# Patient Record
Sex: Female | Born: 1950 | Race: White | Hispanic: No | Marital: Married | Smoking: Former smoker
Health system: Southern US, Community
[De-identification: ages and names within clinical notes are randomized; demographics above are authoritative.]

## PROBLEM LIST (undated history)

## (undated) DIAGNOSIS — M1990 Unspecified osteoarthritis, unspecified site: Secondary | ICD-10-CM

## (undated) DIAGNOSIS — I10 Essential (primary) hypertension: Secondary | ICD-10-CM

## (undated) DIAGNOSIS — T4145XA Adverse effect of unspecified anesthetic, initial encounter: Secondary | ICD-10-CM

## (undated) DIAGNOSIS — R102 Pelvic and perineal pain: Secondary | ICD-10-CM

## (undated) DIAGNOSIS — Z9889 Other specified postprocedural states: Secondary | ICD-10-CM

## (undated) DIAGNOSIS — J189 Pneumonia, unspecified organism: Secondary | ICD-10-CM

## (undated) DIAGNOSIS — E039 Hypothyroidism, unspecified: Secondary | ICD-10-CM

## (undated) DIAGNOSIS — M719 Bursopathy, unspecified: Secondary | ICD-10-CM

## (undated) DIAGNOSIS — R000 Tachycardia, unspecified: Secondary | ICD-10-CM

## (undated) DIAGNOSIS — G459 Transient cerebral ischemic attack, unspecified: Secondary | ICD-10-CM

## (undated) DIAGNOSIS — R002 Palpitations: Secondary | ICD-10-CM

## (undated) DIAGNOSIS — R51 Headache: Secondary | ICD-10-CM

## (undated) DIAGNOSIS — F1120 Opioid dependence, uncomplicated: Secondary | ICD-10-CM

## (undated) DIAGNOSIS — F32A Depression, unspecified: Secondary | ICD-10-CM

## (undated) DIAGNOSIS — I499 Cardiac arrhythmia, unspecified: Secondary | ICD-10-CM

## (undated) DIAGNOSIS — K279 Peptic ulcer, site unspecified, unspecified as acute or chronic, without hemorrhage or perforation: Secondary | ICD-10-CM

## (undated) DIAGNOSIS — Z8719 Personal history of other diseases of the digestive system: Secondary | ICD-10-CM

## (undated) DIAGNOSIS — R109 Unspecified abdominal pain: Secondary | ICD-10-CM

## (undated) DIAGNOSIS — I639 Cerebral infarction, unspecified: Secondary | ICD-10-CM

## (undated) DIAGNOSIS — K219 Gastro-esophageal reflux disease without esophagitis: Secondary | ICD-10-CM

## (undated) DIAGNOSIS — R519 Headache, unspecified: Secondary | ICD-10-CM

## (undated) DIAGNOSIS — E785 Hyperlipidemia, unspecified: Secondary | ICD-10-CM

## (undated) DIAGNOSIS — F329 Major depressive disorder, single episode, unspecified: Secondary | ICD-10-CM

## (undated) DIAGNOSIS — R0600 Dyspnea, unspecified: Secondary | ICD-10-CM

## (undated) DIAGNOSIS — C539 Malignant neoplasm of cervix uteri, unspecified: Secondary | ICD-10-CM

## (undated) DIAGNOSIS — G8929 Other chronic pain: Secondary | ICD-10-CM

## (undated) DIAGNOSIS — R932 Abnormal findings on diagnostic imaging of liver and biliary tract: Secondary | ICD-10-CM

## (undated) DIAGNOSIS — I219 Acute myocardial infarction, unspecified: Secondary | ICD-10-CM

## (undated) DIAGNOSIS — R0602 Shortness of breath: Secondary | ICD-10-CM

## (undated) DIAGNOSIS — D649 Anemia, unspecified: Secondary | ICD-10-CM

## (undated) DIAGNOSIS — J45909 Unspecified asthma, uncomplicated: Secondary | ICD-10-CM

## (undated) DIAGNOSIS — C4490 Unspecified malignant neoplasm of skin, unspecified: Secondary | ICD-10-CM

## (undated) DIAGNOSIS — J449 Chronic obstructive pulmonary disease, unspecified: Secondary | ICD-10-CM

## (undated) DIAGNOSIS — N993 Prolapse of vaginal vault after hysterectomy: Principal | ICD-10-CM

## (undated) DIAGNOSIS — I2510 Atherosclerotic heart disease of native coronary artery without angina pectoris: Secondary | ICD-10-CM

## (undated) DIAGNOSIS — M549 Dorsalgia, unspecified: Secondary | ICD-10-CM

## (undated) DIAGNOSIS — R011 Cardiac murmur, unspecified: Secondary | ICD-10-CM

## (undated) HISTORY — DX: Dorsalgia, unspecified: M54.9

## (undated) HISTORY — PX: APPENDECTOMY: SHX54

## (undated) HISTORY — DX: Other specified postprocedural states: Z98.890

## (undated) HISTORY — DX: Palpitations: R00.2

## (undated) HISTORY — DX: Hypothyroidism, unspecified: E03.9

## (undated) HISTORY — PX: OTHER SURGICAL HISTORY: SHX169

## (undated) HISTORY — PX: CHOLECYSTECTOMY: SHX55

## (undated) HISTORY — PX: TONSILLECTOMY: SHX5217

## (undated) HISTORY — PX: INGUINAL HERNIA REPAIR: SUR1180

## (undated) HISTORY — DX: Essential (primary) hypertension: I10

## (undated) HISTORY — DX: Major depressive disorder, single episode, unspecified: F32.9

## (undated) HISTORY — DX: Transient cerebral ischemic attack, unspecified: G45.9

## (undated) HISTORY — DX: Abnormal findings on diagnostic imaging of liver and biliary tract: R93.2

## (undated) HISTORY — PX: EYE SURGERY: SHX253

## (undated) HISTORY — DX: Other chronic pain: G89.29

## (undated) HISTORY — PX: BACK SURGERY: SHX140

## (undated) HISTORY — DX: Unspecified malignant neoplasm of skin, unspecified: C44.90

## (undated) HISTORY — DX: Tachycardia, unspecified: R00.0

## (undated) HISTORY — PX: JOINT REPLACEMENT: SHX530

## (undated) HISTORY — DX: Acute myocardial infarction, unspecified: I21.9

## (undated) HISTORY — DX: Unspecified abdominal pain: R10.9

## (undated) HISTORY — DX: Depression, unspecified: F32.A

## (undated) HISTORY — DX: Malignant neoplasm of cervix uteri, unspecified: C53.9

## (undated) HISTORY — DX: Atherosclerotic heart disease of native coronary artery without angina pectoris: I25.10

## (undated) HISTORY — DX: Pelvic and perineal pain: R10.2

## (undated) HISTORY — PX: TONSILLECTOMY: SUR1361

## (undated) HISTORY — DX: Prolapse of vaginal vault after hysterectomy: N99.3

## (undated) HISTORY — DX: Gastro-esophageal reflux disease without esophagitis: K21.9

## (undated) HISTORY — PX: PARTIAL HYSTERECTOMY: SHX80

## (undated) HISTORY — PX: ABDOMINAL HYSTERECTOMY: SHX81

## (undated) HISTORY — DX: Chronic obstructive pulmonary disease, unspecified: J44.9

## (undated) HISTORY — DX: Peptic ulcer, site unspecified, unspecified as acute or chronic, without hemorrhage or perforation: K27.9

## (undated) HISTORY — DX: Hyperlipidemia, unspecified: E78.5

## (undated) HISTORY — DX: Bursopathy, unspecified: M71.9

---

## 1981-08-06 DIAGNOSIS — G459 Transient cerebral ischemic attack, unspecified: Secondary | ICD-10-CM

## 1981-08-06 HISTORY — DX: Transient cerebral ischemic attack, unspecified: G45.9

## 1982-08-06 DIAGNOSIS — C539 Malignant neoplasm of cervix uteri, unspecified: Secondary | ICD-10-CM

## 1982-08-06 HISTORY — DX: Malignant neoplasm of cervix uteri, unspecified: C53.9

## 1998-08-06 DIAGNOSIS — I219 Acute myocardial infarction, unspecified: Secondary | ICD-10-CM

## 1998-08-06 HISTORY — DX: Acute myocardial infarction, unspecified: I21.9

## 1999-02-03 ENCOUNTER — Ambulatory Visit (HOSPITAL_COMMUNITY): Admission: RE | Admit: 1999-02-03 | Discharge: 1999-02-03 | Payer: Self-pay | Admitting: Psychiatry

## 1999-05-03 ENCOUNTER — Ambulatory Visit: Admission: RE | Admit: 1999-05-03 | Discharge: 1999-05-03 | Payer: Self-pay | Admitting: Psychiatry

## 1999-08-09 ENCOUNTER — Ambulatory Visit (HOSPITAL_COMMUNITY): Admission: RE | Admit: 1999-08-09 | Discharge: 1999-08-09 | Payer: Self-pay | Admitting: Internal Medicine

## 2002-03-22 ENCOUNTER — Emergency Department (HOSPITAL_COMMUNITY): Admission: EM | Admit: 2002-03-22 | Discharge: 2002-03-22 | Payer: Self-pay | Admitting: *Deleted

## 2002-07-04 ENCOUNTER — Encounter: Payer: Self-pay | Admitting: Emergency Medicine

## 2002-07-04 ENCOUNTER — Emergency Department (HOSPITAL_COMMUNITY): Admission: EM | Admit: 2002-07-04 | Discharge: 2002-07-04 | Payer: Self-pay | Admitting: Emergency Medicine

## 2003-02-06 ENCOUNTER — Emergency Department (HOSPITAL_COMMUNITY): Admission: EM | Admit: 2003-02-06 | Discharge: 2003-02-06 | Payer: Self-pay | Admitting: Emergency Medicine

## 2003-05-22 ENCOUNTER — Emergency Department (HOSPITAL_COMMUNITY): Admission: EM | Admit: 2003-05-22 | Discharge: 2003-05-23 | Payer: Self-pay | Admitting: Emergency Medicine

## 2003-05-25 ENCOUNTER — Emergency Department (HOSPITAL_COMMUNITY): Admission: EM | Admit: 2003-05-25 | Discharge: 2003-05-25 | Payer: Self-pay | Admitting: *Deleted

## 2003-08-07 HISTORY — PX: CARDIAC CATHETERIZATION: SHX172

## 2003-09-09 ENCOUNTER — Emergency Department (HOSPITAL_COMMUNITY): Admission: EM | Admit: 2003-09-09 | Discharge: 2003-09-09 | Payer: Self-pay | Admitting: Emergency Medicine

## 2003-12-04 ENCOUNTER — Emergency Department (HOSPITAL_COMMUNITY): Admission: EM | Admit: 2003-12-04 | Discharge: 2003-12-04 | Payer: Self-pay | Admitting: *Deleted

## 2004-03-21 ENCOUNTER — Inpatient Hospital Stay (HOSPITAL_COMMUNITY): Admission: AD | Admit: 2004-03-21 | Discharge: 2004-03-23 | Payer: Self-pay | Admitting: Cardiology

## 2004-03-21 ENCOUNTER — Encounter: Payer: Self-pay | Admitting: Cardiology

## 2004-04-02 ENCOUNTER — Emergency Department (HOSPITAL_COMMUNITY): Admission: EM | Admit: 2004-04-02 | Discharge: 2004-04-02 | Payer: Self-pay | Admitting: Emergency Medicine

## 2004-04-16 ENCOUNTER — Emergency Department (HOSPITAL_COMMUNITY): Admission: EM | Admit: 2004-04-16 | Discharge: 2004-04-16 | Payer: Self-pay | Admitting: Emergency Medicine

## 2004-04-17 ENCOUNTER — Ambulatory Visit (HOSPITAL_COMMUNITY): Admission: RE | Admit: 2004-04-17 | Discharge: 2004-04-17 | Payer: Self-pay | Admitting: *Deleted

## 2004-05-11 ENCOUNTER — Emergency Department (HOSPITAL_COMMUNITY): Admission: EM | Admit: 2004-05-11 | Discharge: 2004-05-11 | Payer: Self-pay | Admitting: Emergency Medicine

## 2004-06-13 ENCOUNTER — Ambulatory Visit: Payer: Self-pay | Admitting: *Deleted

## 2004-10-03 ENCOUNTER — Ambulatory Visit: Payer: Self-pay | Admitting: *Deleted

## 2004-10-04 ENCOUNTER — Ambulatory Visit: Payer: Self-pay | Admitting: Orthopedic Surgery

## 2004-10-07 ENCOUNTER — Ambulatory Visit (HOSPITAL_COMMUNITY): Admission: RE | Admit: 2004-10-07 | Discharge: 2004-10-07 | Payer: Self-pay | Admitting: Orthopedic Surgery

## 2004-10-12 ENCOUNTER — Ambulatory Visit: Payer: Self-pay | Admitting: Orthopedic Surgery

## 2004-10-18 ENCOUNTER — Ambulatory Visit: Payer: Self-pay | Admitting: Cardiovascular Disease

## 2005-04-01 ENCOUNTER — Inpatient Hospital Stay (HOSPITAL_COMMUNITY): Admission: EM | Admit: 2005-04-01 | Discharge: 2005-04-04 | Payer: Self-pay | Admitting: Emergency Medicine

## 2005-04-02 ENCOUNTER — Ambulatory Visit: Payer: Self-pay | Admitting: Cardiology

## 2005-04-16 ENCOUNTER — Ambulatory Visit (HOSPITAL_COMMUNITY): Admission: RE | Admit: 2005-04-16 | Discharge: 2005-04-16 | Payer: Self-pay | Admitting: Cardiology

## 2005-04-17 ENCOUNTER — Ambulatory Visit: Payer: Self-pay | Admitting: *Deleted

## 2005-04-23 ENCOUNTER — Ambulatory Visit: Payer: Self-pay | Admitting: *Deleted

## 2005-11-26 ENCOUNTER — Emergency Department (HOSPITAL_COMMUNITY): Admission: EM | Admit: 2005-11-26 | Discharge: 2005-11-26 | Payer: Self-pay | Admitting: Emergency Medicine

## 2005-12-04 ENCOUNTER — Ambulatory Visit: Payer: Self-pay | Admitting: *Deleted

## 2005-12-24 ENCOUNTER — Ambulatory Visit (HOSPITAL_COMMUNITY): Admission: RE | Admit: 2005-12-24 | Discharge: 2005-12-24 | Payer: Self-pay | Admitting: Sports Medicine

## 2006-01-11 ENCOUNTER — Ambulatory Visit (HOSPITAL_COMMUNITY): Admission: RE | Admit: 2006-01-11 | Discharge: 2006-01-11 | Payer: Self-pay | Admitting: Sports Medicine

## 2007-08-15 ENCOUNTER — Ambulatory Visit (HOSPITAL_COMMUNITY): Admission: RE | Admit: 2007-08-15 | Discharge: 2007-08-15 | Payer: Self-pay | Admitting: Internal Medicine

## 2007-09-04 ENCOUNTER — Ambulatory Visit: Payer: Self-pay | Admitting: Internal Medicine

## 2007-09-10 ENCOUNTER — Ambulatory Visit (HOSPITAL_COMMUNITY): Admission: RE | Admit: 2007-09-10 | Discharge: 2007-09-10 | Payer: Self-pay | Admitting: Gastroenterology

## 2007-09-10 ENCOUNTER — Ambulatory Visit: Payer: Self-pay | Admitting: Gastroenterology

## 2007-09-10 DIAGNOSIS — Z9889 Other specified postprocedural states: Secondary | ICD-10-CM

## 2007-09-10 HISTORY — DX: Other specified postprocedural states: Z98.890

## 2007-09-16 ENCOUNTER — Ambulatory Visit (HOSPITAL_COMMUNITY): Admission: RE | Admit: 2007-09-16 | Discharge: 2007-09-16 | Payer: Self-pay | Admitting: Gastroenterology

## 2007-09-18 ENCOUNTER — Ambulatory Visit (HOSPITAL_COMMUNITY): Admission: RE | Admit: 2007-09-18 | Discharge: 2007-09-18 | Payer: Self-pay | Admitting: Gastroenterology

## 2007-09-23 ENCOUNTER — Ambulatory Visit: Payer: Self-pay | Admitting: Gastroenterology

## 2007-10-15 ENCOUNTER — Ambulatory Visit: Payer: Self-pay | Admitting: Gastroenterology

## 2007-10-15 ENCOUNTER — Ambulatory Visit (HOSPITAL_COMMUNITY): Admission: RE | Admit: 2007-10-15 | Discharge: 2007-10-15 | Payer: Self-pay | Admitting: Gastroenterology

## 2007-10-16 ENCOUNTER — Ambulatory Visit (HOSPITAL_COMMUNITY): Admission: RE | Admit: 2007-10-16 | Discharge: 2007-10-16 | Payer: Self-pay | Admitting: Gastroenterology

## 2007-10-28 ENCOUNTER — Ambulatory Visit: Payer: Self-pay | Admitting: Gastroenterology

## 2008-03-16 ENCOUNTER — Ambulatory Visit (HOSPITAL_COMMUNITY): Admission: RE | Admit: 2008-03-16 | Discharge: 2008-03-16 | Payer: Self-pay | Admitting: Internal Medicine

## 2008-05-13 ENCOUNTER — Encounter: Payer: Self-pay | Admitting: Emergency Medicine

## 2008-05-13 ENCOUNTER — Observation Stay (HOSPITAL_COMMUNITY): Admission: EM | Admit: 2008-05-13 | Discharge: 2008-05-15 | Payer: Self-pay | Admitting: *Deleted

## 2008-05-14 ENCOUNTER — Encounter (INDEPENDENT_AMBULATORY_CARE_PROVIDER_SITE_OTHER): Payer: Self-pay | Admitting: *Deleted

## 2008-09-13 ENCOUNTER — Ambulatory Visit (HOSPITAL_COMMUNITY): Admission: RE | Admit: 2008-09-13 | Discharge: 2008-09-13 | Payer: Self-pay | Admitting: Internal Medicine

## 2009-01-09 IMAGING — CR DG CHEST 1V PORT
1 series · 1 of 1 positions shown · non-contrast
Comparison: [HOSPITAL] chest x-rays 01/11/2006 and CT
chest 03/16/2008.

CLINICAL DATA: Chest pressure/ache 1 week with fever.

PORTABLE CHEST - 1 VIEW

[view not recorded]
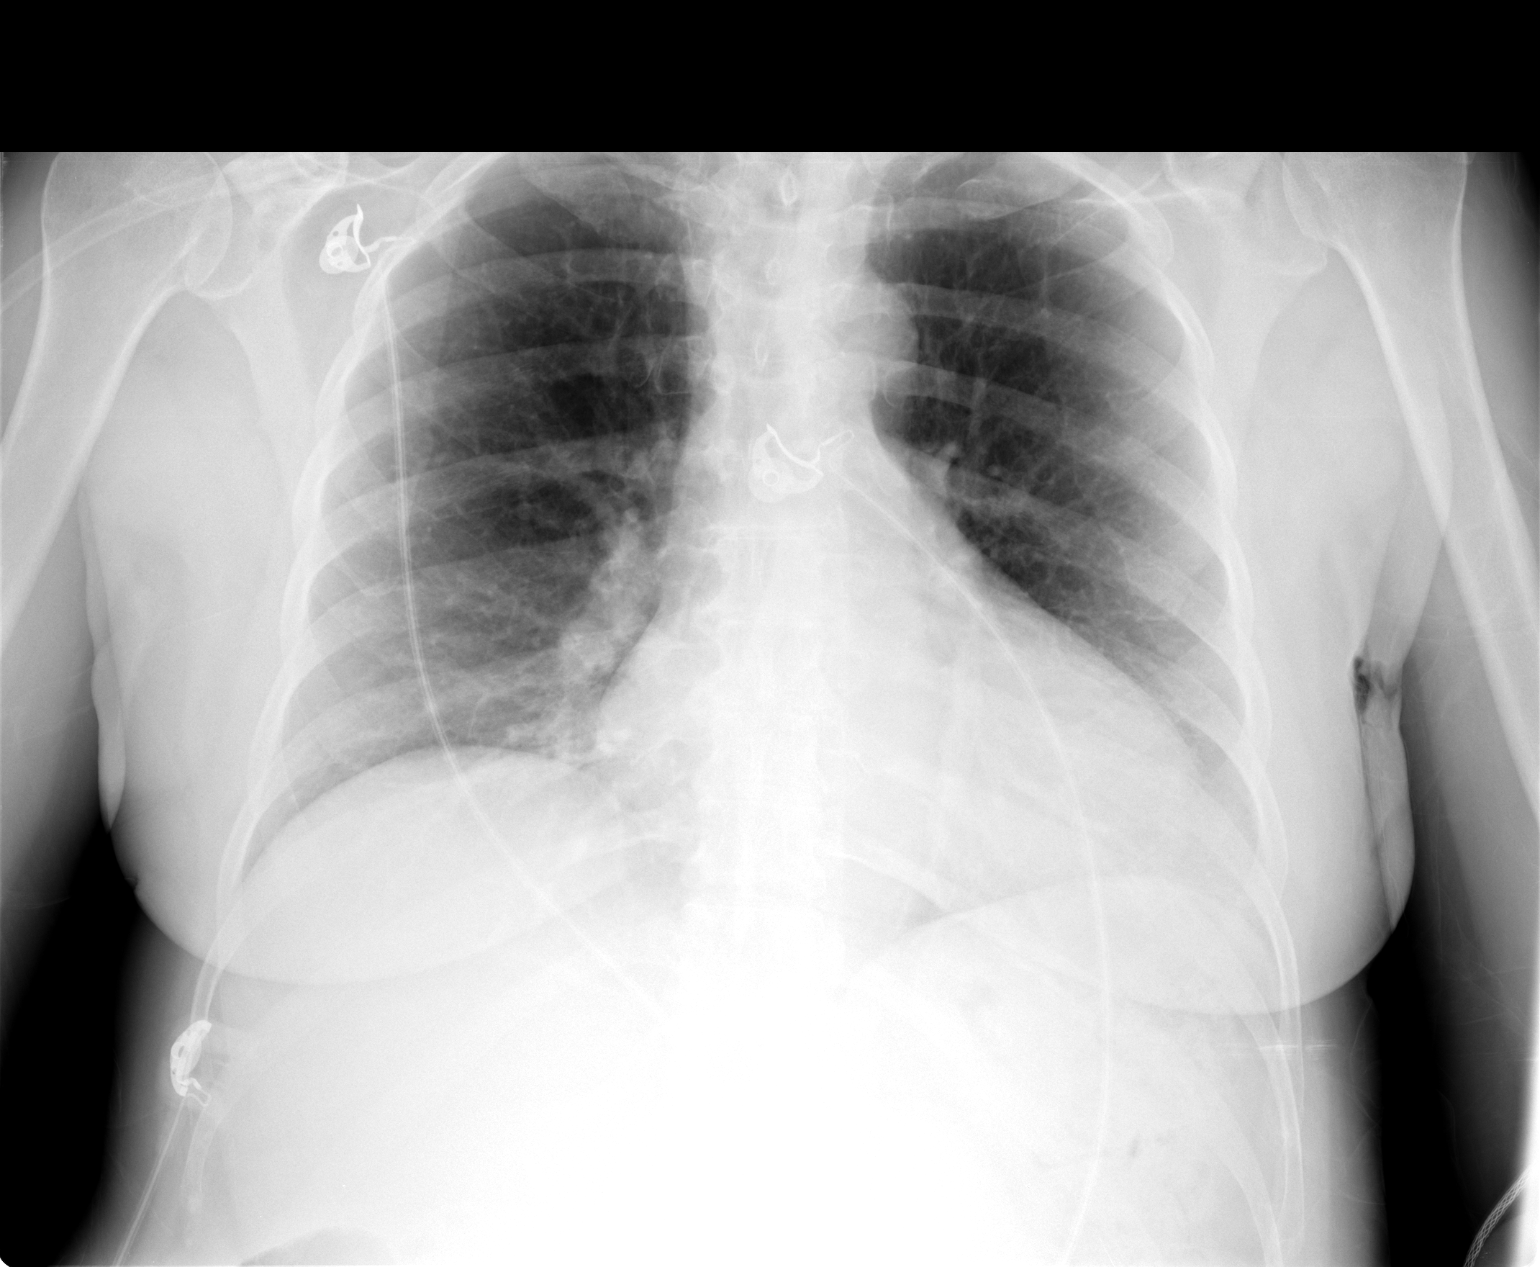

[1 of 1 positions shown; findings below may reference images not displayed]

FINDINGS: Previous slight bibasilar subcentimeter nodules on CT
chest not appreciated. Lesser inspiration is seen with stable
generalized prominence bronchopulmonary markings since chest x-ray
01/11/2006. Lungs are otherwise clear with no edema or focal
pneumonia.  Borderline cardiomegaly noted.  Mediastinum, hila,
pleura and osseous structures appear normal.
IMPRESSION: 1.  Submaximal inspiration with AP magnification borderline
cardiomegaly with stable mild chronic bronchitis.
2.  Otherwise no active cardiopulmonary disease.

## 2009-09-20 ENCOUNTER — Ambulatory Visit (HOSPITAL_COMMUNITY): Admission: RE | Admit: 2009-09-20 | Discharge: 2009-09-20 | Payer: Self-pay | Admitting: Internal Medicine

## 2009-10-14 ENCOUNTER — Emergency Department (HOSPITAL_COMMUNITY)
Admission: EM | Admit: 2009-10-14 | Discharge: 2009-10-14 | Payer: Self-pay | Source: Home / Self Care | Admitting: Emergency Medicine

## 2010-01-20 ENCOUNTER — Emergency Department (HOSPITAL_COMMUNITY): Admission: EM | Admit: 2010-01-20 | Discharge: 2010-01-20 | Payer: Self-pay | Admitting: Emergency Medicine

## 2010-08-27 ENCOUNTER — Encounter: Payer: Self-pay | Admitting: Internal Medicine

## 2010-10-27 LAB — CK TOTAL AND CKMB (NOT AT ARMC)
CK, MB: 2.2 ng/mL (ref 0.3–4.0)
Relative Index: 1.8 (ref 0.0–2.5)

## 2010-11-17 ENCOUNTER — Other Ambulatory Visit (HOSPITAL_COMMUNITY): Payer: Self-pay | Admitting: Obstetrics & Gynecology

## 2010-11-17 DIAGNOSIS — N951 Menopausal and female climacteric states: Secondary | ICD-10-CM

## 2010-11-17 DIAGNOSIS — Z139 Encounter for screening, unspecified: Secondary | ICD-10-CM

## 2010-11-27 ENCOUNTER — Ambulatory Visit (HOSPITAL_COMMUNITY)
Admission: RE | Admit: 2010-11-27 | Discharge: 2010-11-27 | Disposition: A | Discharge status: Home / Self Care | Payer: BC Managed Care – PPO | Source: Ambulatory Visit | Attending: Obstetrics & Gynecology | Admitting: Obstetrics & Gynecology

## 2010-11-27 DIAGNOSIS — Z13820 Encounter for screening for osteoporosis: Secondary | ICD-10-CM | POA: Insufficient documentation

## 2010-11-27 DIAGNOSIS — Z139 Encounter for screening, unspecified: Secondary | ICD-10-CM

## 2010-11-27 DIAGNOSIS — Z780 Asymptomatic menopausal state: Secondary | ICD-10-CM | POA: Insufficient documentation

## 2010-11-27 DIAGNOSIS — N951 Menopausal and female climacteric states: Secondary | ICD-10-CM

## 2010-11-27 DIAGNOSIS — Z1231 Encounter for screening mammogram for malignant neoplasm of breast: Secondary | ICD-10-CM | POA: Insufficient documentation

## 2010-11-28 ENCOUNTER — Other Ambulatory Visit: Payer: Self-pay | Admitting: Obstetrics & Gynecology

## 2010-11-28 DIAGNOSIS — R928 Other abnormal and inconclusive findings on diagnostic imaging of breast: Secondary | ICD-10-CM

## 2010-12-01 ENCOUNTER — Other Ambulatory Visit (HOSPITAL_COMMUNITY): Payer: Self-pay | Admitting: Obstetrics & Gynecology

## 2010-12-06 ENCOUNTER — Ambulatory Visit (HOSPITAL_COMMUNITY)
Admission: RE | Admit: 2010-12-06 | Discharge: 2010-12-06 | Disposition: A | Discharge status: Home / Self Care | Payer: BC Managed Care – PPO | Source: Ambulatory Visit | Attending: Obstetrics & Gynecology | Admitting: Obstetrics & Gynecology

## 2010-12-06 ENCOUNTER — Ambulatory Visit (HOSPITAL_COMMUNITY): Payer: BC Managed Care – PPO

## 2010-12-06 DIAGNOSIS — R928 Other abnormal and inconclusive findings on diagnostic imaging of breast: Secondary | ICD-10-CM | POA: Insufficient documentation

## 2010-12-13 ENCOUNTER — Encounter: Payer: Self-pay | Admitting: Urgent Care

## 2010-12-13 ENCOUNTER — Encounter: Payer: Self-pay | Admitting: Gastroenterology

## 2010-12-13 ENCOUNTER — Ambulatory Visit (INDEPENDENT_AMBULATORY_CARE_PROVIDER_SITE_OTHER): Payer: BC Managed Care – PPO | Admitting: Urgent Care

## 2010-12-13 DIAGNOSIS — K219 Gastro-esophageal reflux disease without esophagitis: Secondary | ICD-10-CM | POA: Insufficient documentation

## 2010-12-13 DIAGNOSIS — R932 Abnormal findings on diagnostic imaging of liver and biliary tract: Secondary | ICD-10-CM

## 2010-12-13 DIAGNOSIS — R1310 Dysphagia, unspecified: Secondary | ICD-10-CM

## 2010-12-13 DIAGNOSIS — R198 Other specified symptoms and signs involving the digestive system and abdomen: Secondary | ICD-10-CM

## 2010-12-13 NOTE — Progress Notes (Signed)
Referring Provider: Carmie End), MD Primary Care Physician:  Carylon Perches, MD Primary Gastroenterologist:  Dr. Darrick Penna  Chief Complaint  Patient presents with  . Dysphagia    HPI:  Selena [name] is a 60 y.o. female here as a referral from Dr. Ouida Sills for dysphagia.  Hx GERD & extensive GI work-up years ago for chronic functional abdominal pain.  3-4 mo ago, developed dysphagia w/ solids & liquids "feels like spasm in upper esophagus".  Felt like was being strangled recently went drinking OJ at home.  Denies any heartburn or indigestion on protonix 40mg  daily.  Wt steadily increasing.  Appetite ok.  Denies nausea or vomiting.  C/o regurgitation a few seconds after eating 2-3 times per week.  Denies any abd pain.  C/o constipation daily OTC stool softener.  BM QOD.  Denies rectal bleeding or melena.  Has green stools.   Past Medical History  Diagnosis Date  . Cervical ca 1984  . Constipation   . Hemorrhoids   . HTN (hypertension)   . Depression     w/ menopause  . Hyperlipemia   . Hypothyroidism   . CAD (coronary artery disease)   . MI (myocardial infarction)   . Tachycardia     being evaluated recently by Dr. Alanda Amass  . TIA (transient ischemic attack) 1983  . GERD (gastroesophageal reflux disease)   . Bursitis   . Chronic abdominal pain     functional, seen @[facility] , extensive WU here benign   . Skin cancer     nose  . Chronic back pain     on methadone  . PUD (peptic ulcer disease) 1997/1998    negative h pylori  . Abnormal findings on imaging of biliary tract 1997-2000    CBD 21mm, Dr Danny Lawless biliary/pancreatic manometry, EUS Dr. Christella Hartigan 09/18/07 dilated but otherwise normal extrahepatic duct, no masses  . S/P colonoscopy 09/10/07    Dr. Teddy Spike sec to tortuous colon, followed by [facility] & flex sig normal    Past Surgical History  Procedure Date  . Partial hysterectomy   . Cholecystectomy   . Appendectomy   . Right arm     nerve  . Inguinal hernia repair    left  . Tonsillectomy     Current Outpatient Prescriptions  Medication Sig Dispense Refill  . calcium carbonate 1250 MG capsule Take 1,250 mg by mouth 2 (two) times daily with a meal.        . Cholecalciferol (VITAMIN D-3 PO) Take 1,000 Int'l Units by mouth daily.        . cyclobenzaprine (FLEXERIL) 10 MG tablet Prn       . furosemide (LASIX) 40 MG tablet       . gabapentin (NEURONTIN) 600 MG tablet       . KLOR-CON M10 10 MEQ tablet       . levothyroxine (SYNTHROID, LEVOTHROID) 150 MCG tablet Take 150 mcg by mouth daily.        Marland Kitchen lisinopril (PRINIVIL,ZESTRIL) 10 MG tablet       . methadone (DOLOPHINE) 10 MG tablet       . metoprolol tartrate (LOPRESSOR) 25 MG tablet       . pantoprazole (PROTONIX) 40 MG tablet       . simvastatin (ZOCOR) 40 MG tablet       . verapamil (CALAN-SR) 240 MG CR tablet         Allergies as of 12/13/2010 - Review Complete 12/13/2010  Allergen Reaction Noted  . Iohexol  08/15/2007  . Nubain (nalbuphine hcl)  12/13/2010  . Penicillins  12/13/2010    Family History:  There is no known family history of colorectal carcinoma , liver disease, or inflammatory bowel disease.   Problem Relation Age of Onset  . Ulcers Brother   . Ulcers Sister     History   Social History  . Marital Status: Married    Spouse Name: N/A    Number of Children: 1  . Years of Education: N/A   Occupational History  . 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 Smoking status: Former Smoker -- 1.0 packs/day for 22 years    Types: Cigarettes    Quit date: 01/05/2004  . Smokeless tobacco: Not on file  . Alcohol Use: No  . Drug Use: Not on file  . Sexually Active: No   Review of Systems: Gen: Denies any fever, chills, sweats, anorexia, fatigue, weakness, malaise, weight loss, and sleep disorder CV: Denies chest pain, angina, palpitations, syncope, orthopnea, PND, peripheral edema, and claudication. Resp: Denies dyspnea at rest, dyspnea with exercise,  cough, sputum, wheezing, coughing up blood, and pleurisy. GI: Denies vomiting blood, jaundice, and fecal incontinence.   Denies dysphagia or odynophagia. GU : Denies urinary burning, blood in urine, urinary frequency, urinary hesitancy, nocturnal urination, and urinary incontinence. MS: Denies joint pain, limitation of movement, and swelling, stiffness, low back pain, extremity pain. Denies muscle weakness, cramps, atrophy.  Derm: Denies rash, itching, dry skin, hives, moles, warts, or unhealing ulcers.  Psych: Denies depression, anxiety, memory loss, suicidal ideation, hallucinations, paranoia, and confusion. Heme: Denies bruising, bleeding, and enlarged lymph nodes.  Physical Exam: BP 90/61  Pulse 97  Temp(Src) 96 F (35.6 C) (Tympanic)  Ht 5\' 5"  (1.651 m)  Wt 195 lb (88.451 kg)  BMI 32.45 kg/m2 General:   Alert,  Well-developed, well-nourished, pleasant and cooperative in NAD Head:  Normocephalic and atraumatic. Eyes:  Sclera clear, no icterus.   Conjunctiva pink. Ears:  Normal auditory acuity. Nose:  No deformity, discharge,  or lesions. Mouth:  No deformity or lesions, dentition normal. Neck:  Supple; no masses or thyromegaly. Lungs:  Clear throughout to auscultation.   No wheezes, crackles, or rhonchi. No acute distress. Heart:  Regular rate and rhythm; no murmurs, clicks, rubs,  or gallops. Abdomen:  Soft, nontender and nondistended. No masses, hepatosplenomegaly or hernias noted. Normal bowel sounds, without guarding, and without rebound.   Msk:  Symmetrical without gross deformities. Normal posture. Pulses:  Normal pulses noted. Extremities:  Without clubbing or edema. Neurologic:  Alert and  oriented x4;  grossly normal neurologically. Skin:  Intact without significant lesions or rashes. Cervical Nodes:  No significant cervical adenopathy. Psych:  Alert and cooperative. Normal mood and affect.

## 2010-12-13 NOTE — Assessment & Plan Note (Signed)
Well controlled on daily pantoprazole 40mg .

## 2010-12-13 NOTE — Progress Notes (Signed)
Cc to PCP 

## 2010-12-13 NOTE — Assessment & Plan Note (Signed)
Selena [name] is a 60 y.o. caucasian female w/ 3-4 month hx of dysphagia with both solids & liquids in setting of chronic GERD, as well as what appears to be some aspiration.  Cannot r/o oropharyngeal component, but will need evaluation w/ EGD and possible esophageal dilation by Dr. Jonette Eva to rule out structural component such as web, ring or stricture.  I have discussed risks & benefits which include, but are not limited to, bleeding, infection, perforation & drug reaction.  The patient agrees with this plan & written consent will be obtained.  Procedure will need to be done with deep sedation (propofol) in the OR under the direction of anesthesia services for hx chronic narcotic use.    On a side note, she is due for colonoscopy w/ fluoroscopy/overtube at [facility] for incomplete exam 3 yrs ago.  This can be arranged after dysphagia work-up.

## 2010-12-18 ENCOUNTER — Encounter (HOSPITAL_COMMUNITY): Payer: BC Managed Care – PPO

## 2010-12-18 ENCOUNTER — Other Ambulatory Visit: Payer: Self-pay | Admitting: Gastroenterology

## 2010-12-18 LAB — BASIC METABOLIC PANEL
BUN: 26 mg/dL — ABNORMAL HIGH (ref 6–23)
Chloride: 102 mEq/L (ref 96–112)
Glucose, Bld: 83 mg/dL (ref 70–99)
Potassium: 4.3 mEq/L (ref 3.5–5.1)
Sodium: 140 mEq/L (ref 135–145)

## 2010-12-19 NOTE — Op Note (Signed)
Selena [name], GABA               ACCOUNT NO.:  [id number]   MEDICAL RECORD NO.:  [id number]          PATIENT TYPE:  AMB   LOCATION:  DAY                           FACILITY:  [facility]   PHYSICIAN:  Kassie Mends, M.D.      DATE OF BIRTH:  May 01, 1951   DATE OF PROCEDURE:  10/15/2007  DATE OF DISCHARGE:                               OPERATIVE REPORT   PROCEDURE:  Sigmoidoscopy.   INDICATION FOR EXAM:  Ms. Barcia is a 60 year old female who had an  attempted colonoscopy on September 10, 2007.  It was an incomplete  colonoscopy due to looping of the scope in the bowel.  A barium enema  was ordered and performed to complete her evaluation.  It was noted that  she had a filling defect in the sigmoid colon suspicious for sessile  polyp.  On the barium enema it was noted that she had mark sigmoid and  less severe hepatic flexure redundancy.  The sigmoidoscopy is being  performed to reevaluate her sigmoid colon due to redundancy and the  possibility of the small polyp that could have been easily missed.   FINDINGS:  1. Advanced the colonoscope to 60-70 cm beyond anal verge.  The scope      was withdrawn and no polyps were visualized.  The scope was again      advanced to 60-70 cm beyond anal verge to what appeared to be the      splenic flexure.  The scope was slowly withdrawn and again, no      sessile polyp was noted.  She did have several areas of viscous      mucus that was adhered to the bowel wall.  2. Rare sigmoid colon diverticula.  Melanosis coli.  Otherwise no      masses, inflammatory changes or arteriovenous malformations seen.  3. Moderate internal hemorrhoids, otherwise normal retroflexed view of      the rectum.   RECOMMENDATIONS:  1. We recommend Ms. Labrum have a colonoscopy within three years at      [facility] with fluoroscopy and      an overtube in order to completely evaluate her colon.  2. She should follow high fiber diet.  She is  given a handout on high-      fiber diet and hemorrhoids and diverticulosis.   MEDICATIONS:  1. Demerol 75 mg IV.  2. Versed 4 mg IV.  3. Phenergan 12.5 mg IV.   PROCEDURE TECHNIQUE:  Physical exam was performed.  Informed consent was  obtained from the patient after explaining the benefits, risks and  alternatives to the procedure.  The patient was connected to monitor and  placed in left lateral position.  Continuous oxygen was provided by  nasal cannula and IV medicine administered through an indwelling  cannula.  After administration of sedation and rectal exam, the  patient's rectum intubated.  The scope was advanced under direct  visualization to what appeared to be the splenic flexure.  The scope was  withdrawn slowly by carefully examine the color, texture, anatomy and  integrity of the mucosa on the way out.  The scope was again  advanced under direct visualization to what appeared to be the splenic  flexure approximately 60 to 70 cm from anal verge.  The scope was  removed slowly by carefully examining the color, texture, anatomy and  integrity of the mucosa on the way out.  The patient was recovered in  endoscopy and discharged home in satisfactory condition.      Kassie Mends, M.D.  Electronically Signed     SM/MEDQ  D:  10/15/2007  T:  10/16/2007  Job:  [id number]   cc:   Kingsley Callander. Ouida Sills, MD  Fax: [phone]

## 2010-12-19 NOTE — H&P (Signed)
NAMEKEIASHA, DIEP NO.:  [id number]   MEDICAL RECORD NO.:  [id number]          PATIENT TYPE:  INP   LOCATION:  2907                         FACILITY:  [facility]   PHYSICIAN:  Elmore Guise., M.D.DATE OF BIRTH:  02-25-1951   DATE OF ADMISSION:  05/13/2008  DATE OF DISCHARGE:                              HISTORY & PHYSICAL   PRIMARY CARDIOLOGIST:  Dr. Ronny Flurry.   REASON FOR ADMISSION:  Chest pain.   HISTORY OF PRESENT ILLNESS:  Ms. Jasmin is a very pleasant 60 year old  white female with past medical history of chronic lower back pain,  hypertension, dyslipidemia, history of CVA (1983), and history of  gastroesophageal reflux disease, who presented to an outside hospital  with 1-week history of increasing malaise, subjective fever,  nonproductive cough and off-and-on chest pressure.  The patient reports  her chest pressure starting really 2 days ago; however, it has worsened  over the last 2 days.  Today, she was at home, just doing her normal  activities when she felt a pressure.  She felt like an elephant  sitting on my chest.  This was associated with mild shortness of  breath, as well as palpitations with my heart fluttering.  With her  symptoms, she called the office.  She was told to go to her local  emergency room.  There, she was given nitroglycerin with some  improvement; however, her symptoms recurred.  Therefore, the patient was  started on a nitroglycerin drip.  She was transferred to [facility]  for further evaluation.  The patient reports chronic lower back pain.  She has had no orthopnea, PND or lower extremity edema.  She is able to  ambulate normally without any significant problems.  She denies any  claudication type symptoms.  She has had chronic reflux, however, this  is different than her indigestion pain.  She is allergic to IV contrast  causing anaphylaxis.   REVIEW OF SYSTEMS:  Are as per HPI.  All others negative.   CURRENT MEDICATIONS:  1. Synthroid 150 mcg daily.  2. Flexeril 10 mg q.8 h.  3. Neurontin 600 mg daily.  4. Digoxin 0.25 mg daily.  5. Zocor 40 mg daily.  6. Methadone 10 mg twice daily.  7. Toprol XL 25 mg daily.  8. Verapamil 180 mg daily.  9. Lasix 40 mg daily.  10.Imdur 30 mg daily.   ALLERGIES:  1. IV CONTRAST.  2. NUBAIN.  3. PENICILLIN.   FAMILY HISTORY:  Positive for hypertension and heart disease.   SOCIAL HISTORY:  She is married.  She denies any tobacco or alcohol use.   PHYSICAL EXAMINATION:  VITAL SIGNS:  She is afebrile.  Blood pressure  135/70, heart rate is in the 60s.  She is sating 95% on room air.  GENERAL:  She is a very pleasant middle-aged white female, alert and  oriented x4 in no acute distress.  HEENT:  Appears normal.  NECK:  Supple.  No lymphadenopathy, 2+ carotids.  No JVD and no bruits.  LUNGS:  Clear.  HEART:  Regular with no murmurs, gallops  or rubs.  ABDOMEN:  Soft, nontender, nondistended.  No rebound or guarding.  EXTREMITIES:  Warm with 2+ pulses and no edema.   Her chest x-ray shows very mild cardiomegaly, otherwise no acute  cardiopulmonary disease.  Her D-dimer is 0.49.  Her BUN and creatinine  are 5 and 0.72, potassium level 4.3.  Her white blood cell count is 6.2  with hemoglobin of 14.4, platelet count of 241.  She did have 7%  eosinophils noted on her differential.  Her LFTs are normal.  Her  cardiac markers are negative.  Her ECG shows sinus bradycardia at 56 per  minute with T-wave inversions in V1-V2.  Compared to her prior tracing  from 2005, her Her T-wave changes have actually improved.  At that  tracing, she had T-wave inversions all the way across her precordial  leads.  She did undergo cardiac catheterization back in 2005 by Dr.  Charlies Constable.  This showed normal left main, LAD, septal perforators,  diagonal were normal, circumflex, ramus, obtuse marginals were normal.  Right coronary was nondominant and normal, LV was  55-60%   IMPRESSION:  1. 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  2. Normal catheterization back in 2005.  3. Hypertension.  4. Dyslipidemia.  5. Chronic low back pain.   PLAN:  At this time, we will admit the patient to the hospital.  We will  rule out myocardial infarction by checking serial cardiac enzymes.  I do  wonder whether her symptoms are related to her recent virus.  She will  have an echocardiogram performed.  We will stop her digoxin and her  Toprol because of her bradycardia and continue nitroglycerin and  verapamil since possible vasospastic component could be involved.  We  will start DVT dose Lovenox.  She will have fasting lipids, TSH, as well  as routine blood work in the morning.  Dr. Patty Sermons to assume care in  the morning.      Elmore Guise., M.D.  Electronically Signed     TWK/MEDQ  D:  05/13/2008  T:  05/13/2008  Job:  [id number]   cc:   Cassell Clement, M.D.

## 2010-12-19 NOTE — Op Note (Signed)
NAMESHARLON, PFOHL               ACCOUNT NO.:  [id number]   MEDICAL RECORD NO.:  [id number]          PATIENT TYPE:  AMB   LOCATION:  DAY                           FACILITY:  [facility]   PHYSICIAN:  Kassie Mends, M.D.      DATE OF BIRTH:  04-06-1951   DATE OF PROCEDURE:  09/10/2007  DATE OF DISCHARGE:                               OPERATIVE REPORT   REFERRING PHYSICIAN:  Kingsley Callander. Ouida Sills, M.D.   PROCEDURE:  Incomplete colonoscopy due to looping of the scope in the  bowel.   INDICATION FOR EXAM:  Ms. Wank is a 60 year old female who had a  colonoscopy greater than ten years ago.  She presents with a significant  past medical history of hemorrhoids, constipation and cervical cancer.  She is also on chronic narcotics.  She was complaining of right lower  quadrant abdominal pain and rectal bleeding.   FINDINGS:  1. Extremely tortuous sigmoid colon which prevented successful      intubation of the cecum.  The colonoscope was advanced to      approximately 70-80 cm beyond the anal verge, but the cecum was not      visualized.  No polyps, masses, inflammatory changes, diverticula,      or AVMs seen.  2. Melanosis coli.  3. Moderate internal hemorrhoids.  Otherwise, normal retroflexed view      of the rectum.   RECOMMENDATIONS:  1. Double contrast barium enema to complete evaluation of her colon.      She should have future colonoscopies performed with fluoroscopy.  2. She should follow a high fiber diet.  She is given handouts on high      fiber diet, hemorrhoids, and constipation.  3. She will be scheduled for an endoscopic ultrasound as an outpatient      with Dr. Wendall Papa for a dilated common bile duct.   MEDICATIONS:  1. Demerol 75 mg IV.  2. Versed 3 mg IV.  3. Phenergan 25 mg IV.   PROCEDURE TECHNIQUE:  Physical exam was performed.  Informed consent was  obtained from the patient after explaining the benefits, risks and  alternatives to the procedure.  The patient was  connected to the monitor  and placed in the left lateral position.  Continuous oxygen was provided  by nasal cannula and IV medicine administered through an indwelling  cannula.  After administration of sedation and rectal exam, the  patient's rectum was intubated and the scope was advanced under direct  visualization to the transverse colon.  The scope was removed slowly by  carefully examining the color, texture, anatomy and integrity of the  mucosa on the way out.  The procedure was limited due to large amounts  of liquid stool in the colon which contained blueberries.  The patient  was recovered in endoscopy and discharged home in satisfactory  condition.   ADDENDUM:  BEn shows possible sigmoid polyp. Needs repeat sigmoidoscopy.  Discussed with patient. Will await call from office.      Kassie Mends, M.D.  Electronically Signed     SM/MEDQ  D:  09/10/2007  T:  09/10/2007  Job:  [id number]   cc:   Kingsley Callander. Ouida Sills, MD  Fax: [phone]

## 2010-12-19 NOTE — Consult Note (Signed)
NAMEISMA, TIETJE               ACCOUNT NO.:  [id number]   MEDICAL RECORD NO.:  [id number]          PATIENT TYPE:  AMB   LOCATION:  DAY                           FACILITY:  [facility]   PHYSICIAN:  Kassie Mends, M.D.      DATE OF BIRTH:  1951/02/17   DATE OF CONSULTATION:  09/04/2007  DATE OF DISCHARGE:                                 CONSULTATION   CHIEF COMPLAINT:  Rectal bleeding, abdominal pain.   HISTORY OF PRESENT ILLNESS:  The patient is a 60 year old Caucasian  female who presents today at the rest of Dr. Carylon Perches for further  evaluation of hematochezia and right lower quadrant abdominal pain.  The  patient is well known to our practice from an extensive evaluation by  Dr. Karilyn Cota.  She has not been seen in our practice, however, since 2002.  Please see below for previous work-up.  She states over the past 1-2  months she has had problems with rectal bleeding.  It occurred initially  around the holidays when she was at work for extensive hours standing on  a concrete floor.  She states she would have a lot of back pain when she  does have chronic issues and stated having some rectal pain and  pressure.  She states her stools have been soft, and she started passing  a large amount of blood per rectum.  This occurred 2-3 times a week for  a couple weeks.  She denies any black stools.  Denies any blood clots.  Her stools are regular, as well as she takes her stool softener with  laxative daily.  Over the past 4 weeks, she has had several episodes of  nausea which wake her up in the early morning hours.  They last for  several hours at a time.  She has also been having a lot of abdominal  bloating and right lower quadrant abdominal pain which radiates to her  back.  These symptoms are all new in the past 1 month.  She denies any  GERD.  Denies any dysphagia, odynophagia or weight loss.  The patient  states her right lower quadrant abdominal pain is worsened with  prolonged sitting  and walking, related to meals.   CURRENT MEDICATIONS:  1. Synthroid 100 mcg daily.  2. Imdur 30 mg q.h.s.  3. Lasix 40 mg daily.  4. Neurontin 600 mg every 6 hours.  5. Stool softener with laxative daily.  6. Verapamil 180 mg daily.  7. Methadone 10 mg daily.  8. Toprol-XL 25 mg daily.  9. Lanoxin 0.25 mg daily.  10.Flexeril 5 mg daily.  11.Zocor 40 mg q.h.s.  12.Vitamin D three daily.  13.Vitamin C 1000 mg daily.  14.Aspirin 81 mg daily.   ALLERGIES:  1. IV CONTRAST caused anaphylaxis.  2. NUBAIN caused cardiac arrest and itching.  3. PENICILLIN causes itching.   PAST MEDICAL HISTORY:  Extensive and includes:  1. Hypertension.  2. Hyperlipidemia.  3. Hypothyroidism.  4. History of mild cerebrovascular accident in 1983.  5. Bilateral hip bursitis requiring injections.  6. History of small skin cancer  removed from her nose.  7. She has 3 fractures in her back with chronic back pain.  8. History of MI in 2004; however, no CAD noted.  9. Depression.  10.Hysterectomy for cervical cancer in 1986.  11.Tonsillectomy.  12.Cholecystectomy in 1997 by Dr. Lovell Sheehan.  13.Right elbow surgery for nerve injury.  14.Left inguinal herniorrhaphy.  15.Colonoscopy in 1997 by Dr. Lovell Sheehan revealed hemorrhoids.  16.History of peptic ulcer disease.  Initially diagnosed in 1997 in      [place].  17.She had an EGD by Dr. Lovell Sheehan in 1997 which revealed esophagitis,      gastritis and healing prepyloric ulcer.  CLOtest was negative.      Repeat EGD the same year by Dr. Karilyn Cota revealed erosive antral      gastritis and a mildly dilated descending duodenum.  H. pylori      serologies were positive in 1997.  She did not undergo treatment      because she was having so many issues with vomiting.  EGD in 2000      by Dr. Karilyn Cota reveals sliding hiatal hernia, gastric erosions with      2 scars.  EGD in 2002 by Dr. Karilyn Cota revealed no ulcers.  In 1998,      she did have gastric ulcers on an EGD with  negative biopsies.  18.History of dilated common bile duct in 1997 by CT.  It was 1 cm.      Common bile duct was 11.5 mm in July 1997.  She underwent biliary      and pancreatic manometry by Dr. Danny Lawless in 1997 which was negative.      Common bile duct was 11 mm at that time.  In 2000, common bile duct      was 12 mm.  Currently, common bile duct is 21 mm on current CT.  19.She has had an appendectomy.   FAMILY HISTORY:  She does not know her biological parents.   SOCIAL HISTORY:  She is married.  She has 1 child.  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  She quit smoking in 2005.  No  alcohol use.   REVIEW OF SYSTEMS:  GI:  See HPI.  CARDIOPULMONARY:  No chest pain or  shortness of breath.  GENITOURINARY:  No dysuria or hematuria.  CONSTITUTIONAL:  No weight loss.   PHYSICAL EXAMINATION:  VITAL SIGNS:  Weight 182, height 5 feet, 7.5  inches.  Temperature 98, blood pressure 122/80, pulse 72.  GENERAL:  Pleasant, well-nourished, well-developed Caucasian female in  no acute distress.  SKIN:  Warm and dry.  No jaundice.HEENT:  Sclerae nonicteric.  Oropharyngeal mucosa moist and pink.  No lesions, erythema or exudate.  No lymphadenopathy.CHEST:  Lungs clear to auscultation.  CARDIAC:  Regular rate and rhythm.  Normal S1 and S2.  No murmurs, rubs, or  gallops.ABDOMEN:  Positive bowel sounds.  Abdomen is soft, nondistended.  She has vague mild right lower quadrant tenderness to deep palpation.  No rebound tenderness, no guarding.  No abdominal bruits or hernias.  No  hepatosplenomegaly or masses.EXTREMITIES:  Lower extremities with no  edema.   CT of the abdomen and pelvis on August 15, 2007 with oral contrast only  revealed subcentimeter non-calcified lower lobe nodules bilaterally,  extrahepatic common bile duct dilation, but no intrahepatic dilatation.  Common bile duct measured 21 mm, felt to be post cholecystectomy effect.   IMPRESSION:  1. The patient is a  60 year old lady  who presents with a 23-month      history of right lower quadrant abdominal pain, unclear etiology.      Cannot exclude musculoskeletal at this time.  CT fails to show a      reason for her pain.  She also has hematochezia with what she      describes as large volume with soft stools.  It has been over 10      years since her last colonoscopy.  2. Common bile duct dilatation, currently 21 mm (had been around 12 mm      back in 2000).  She does not describe any upper abdominal symptoms      suggestive of biliary disease.  She has already has sphincter of      Oddi testing about 10 years ago, which was negative.  Will      similarly check LFT's at this time and discuss further with Dr.      Cira Servant.   PLAN:  1. Colonoscopy with Dr. Cira Servant in the near future.  2. LFT's, CBC.  3. Further recommendations to follow.  4. Consider EUS or MRCP to evaluate her dilated CBD.   I would like to thank Dr. Ouida Sills for allowing [place] to take part in the care  of this patient.      Tana Coast, P.A.      Kassie Mends, M.D.  Electronically Signed    LL/MEDQ  D:  09/04/2007  T:  09/04/2007  Job:  [id number]   cc:   Kingsley Callander. Ouida Sills, MD  Fax: [phone]

## 2010-12-19 NOTE — Discharge Summary (Signed)
Selena [name], Selena [name]               ACCOUNT NO.:  [id number]   MEDICAL RECORD NO.:  [id number]          PATIENT TYPE:  INP   LOCATION:  4712                         FACILITY:  [facility]   PHYSICIAN:  Cassell Clement, M.D. DATE OF BIRTH:  July 25, 1951   DATE OF ADMISSION:  05/13/2008  DATE OF DISCHARGE:  05/15/2008                               DISCHARGE SUMMARY   FINAL DIAGNOSIS:  1. Chest pain, myocardial infarction, ruled out possible vasospasm.  2. Normal cardiac catheterization in 2005.  3. Hypertensive cardiovascular disease.  4. Dyslipidemia.  5. Chronic pain syndrome followed in the pain clinic in [facility].  6. Hypothyroidism.  7. Recent viral illness.   OPERATIONS PERFORMED:  2-D echo.   HISTORY:  This is a 60 year old married Caucasian female from [place]  who was admitted as an emergency on May 13, 2008, with chest pain.  She had gone to [facility] and was sent to the emergency room.  She did have abnormal EKG, but it was actually improved since previous  EKG of 2005.  She described the feeling as like an elephant sitting on  her chest and she had mild shortness of breath and also history of some  palpitations.  She had a previous viral-type illness with fever and  nonproductive cough 1 week earlier.  She has a remote history of  cerebrovascular accident.   HOME MEDICATIONS:  Included Synthroid, Flexeril, Neurontin, digoxin,  Zocor, methadone, Toprol, verapamil, Lasix and Imdur.   Physical findings on admission revealed normal vital signs.  Her lungs  are clear.  The hear reveals no murmur, gallop, rub, or click.  The  abdomen is soft and nontender.  The extremities show no phlebitis or  edema.   Her initial cardiac markers were negative.  EKG showed sinus bradycardia  at 56 per minute with T-wave inversions in V1 and V2.  Chest x-ray  showed no active disease.   HOSPITAL COURSE:  The patient was admitted to telemetry.  She was placed  on a IV  nitroglycerin drip.  She was given aspirin and was continued on  her verapamil.  Because of her sinus bradycardia, her Toprol and her  digoxin were stopped.  She ruled out for myocardial infarction.  On the  day after admission, she underwent a 2-D echocardiogram, which showed  normal left ventricular systolic function with ejection fraction 55-60  with no regional wall motion abnormalities.  She did have LVH and she  had abnormal diastolic function with impaired relaxation.  She was also  noted to have mildly increased aortic valve thickness with mild aortic  regurgitation.  The patient was transferred to the regular floor and  ambulated.  She had no further chest pain and was able to be discharged,  improved on May 15, 2008.   ACCESSORY LABORATORY STUDIES:  Hemoglobin 12.9, white count 6500, and  platelets 225,000.  Sodium 137, potassium 4.3, BUN 5, creatinine 0.72,  and blood sugar 98.  Liver function studies normal.  Cardiac enzymes  normal.  Cholesterol 180, LDL 80, HDL 41, triglycerides 296, and calcium  9.5.  Her fibrin split  products were normal.  TSH was slightly elevated  at 6.16.   The patient is being discharged, improved on a low-sodium heart healthy  diet.  Her weight at discharge is about 183.   Her discharge meds are:  1. Synthroid 150 mcg daily.  2. Gabapentin 600 mg 4 times a day.  3. Simvastatin 40 mg daily.  4. Methadone 10 mg twice a day from the [facility].  5. Verapamil 80 mg daily.  6. Lasix 40 mg daily.  7. Aspirin 325 mg daily.  8. Flexeril 10 mg taking two twice a day.  9. Generic Imdur 30 mg each morning.  10.Nitrostat 1/150 sublingually p.r.n.  11.We are adding ramipril 2.5 mg 1 daily for treatment of her      hypertension and her LVH.   She is to stop taking her digoxin and her Toprol for the time being.  She will be seen back in the office and 10-14 days for a walking  adenosine Cardiolite stress test and will also get BMET and  follow up on  her ACE inhibitor therapy.   Condition on discharge is improved.           ______________________________  Cassell Clement, M.D.     TB/MEDQ  D:  05/15/2008  T:  05/15/2008  Job:  [id number]   cc:   Kingsley Callander. Ouida Sills, MD

## 2010-12-19 NOTE — Assessment & Plan Note (Signed)
NAMEKENDREA, CERRITOS                CHART#:  [id number]   DATE:  10/28/2007                       DOB:  06/27/1951   REFERRED BY:  Dr. Kingsley Callander. [place].   PROBLEM LIST:  1. Dilated common bile duct with no evidence of biliary stones or      stricture by endoscopic ultrasound in February 2009.  2. Constipation.  3. Hemorrhoids.  4. Chronic right lower quadrant pain.  5. Allergy to IV CONTRAST (anaphylaxis).  6. Hypertension.  7. Hyperlipidemia.  8. Hypothyroidism.  9. Depression.  10.Cholecystectomy in 1997.  11.Appendectomy.   SUBJECTIVE:  Ms. Weinman is a 60 year old female who presents as a  return patient visit.  She was first seen in the clinic in 1995, by Dr.  Jonathon Bellows.  At that time she was complaining of abdominal pain  and constipation.  She also had chronic low back pain and was using  Lortab, Doxepin and Valium.  She was also complaining of dyspepsia,  which began in 1985.  She had an EGD and a colonoscopy in 1997,  performed by Dr. Lovell Sheehan, which showed distal esophagitis, gastritis and  hemorrhoids.  Her HIDA scan in 1997, showed a borderline ejection  fraction of 28% with a normal range of 35%.  She had a laparoscopic  cholecystectomy which the pathology revealed the gallbladder wall with  unevenly thickened muscularis mucosa, mild four-wall thickness, chronic  inflammatory infiltrate, subserosal fibrosis and no evidence of stones.  The final diagnosis was chronic cholecystitis.  She had a CT scan  following her cholecystectomy which showed a 1 cm common bile duct and  she continued to complain of right-sided abdominal pain.  She was seen  by Dr. Lionel December.  He considered an endoscopic retrograde  cholangiopancreatography and started her on Levbid in 1997.  In 1997,  due to her continue complaint of epigastric and right upper quadrant  pain, an EGD was performed.  This showed gastritis and an unusual distal  esophagus.  The biopsies of the esophagus  showed no histopathologic  abnormality.  She again had an abdominal ultrasound which showed her  dilated common bile duct and normal liver and normal pancreas.  She was  referrred to Dr. Danny Lawless for biliary manometry and pancreatic manometry  which were normal.  In 1997, she weighed 134 pounds.  She had a small  bowel follow-through in 1997, which was normal.  Arteriogram was  performed in October 1997, which showed no evidence of stenosis of the  celiac, the SMA or the IMA.  It was recommended after this study that  she receive steroids prior to any future contrast administration.   The working diagnosis in 1997, was functional abdominal pain.  She was  sent to the [facility] Center at [facility].  She received multiple prescriptions for Darvocet-N-100 for  abdominal pain from Dr. Karilyn Cota between 1997, and 1998.  Her bowel  movements were varying from diarrhea to constipation.  She had an upper  GI series in 1998, which showed an under-distended stomach, mild,  secondary to the gastric folds.  In 1998, she again had an upper  endoscopy.  This time it showed gastric ulcers and multiple erosions.  Biopsy showed chronic active gastritis without evidence of H. pylori.  She had a celiac plexus block  for her chronic abdominal pain in May  1998.  The evaluation at the Pain Clinic stated that she shows a great  deal of sick role behaviors and a very strong illness identity.  In  followup in July 1998, she told the Pain Center that she only had five  hours of benefit from her celiac plexus block.  She was then started on  methadone 5 mg three times daily.  In followup she was still complaining  of pain at 8/10 and a repeat celiac plexus block was performed.   She was seen again in the office in the Pain Clinic in February 2000.  Her methadone was increased to four times daily and Vioxx was added.  In  April 2000, it was noted that she was 189 pounds.  It was also  noted  that she had a hemoglobin of 10.7 with an MCV of 73.  An ultrasound  showed a mildly dilated common bile duct at 1.2 cm.  An EGD in May 2000,  showed a sliding hiatal hernia with gastric erosions.  She was seen in  consultation at [facility] for iron  deficiency anemia.  She reported headaches with oral iron and the  suggestion was made that perhaps she needed IV iron.  In January 2001,  she was seen and evaluated by Dr. Ladona Horns. [place].  In October 2002,  she presented with a cough and said she had a pH study done in [place]  which documented acid reflux.  Between 2000, and 2002, she had several  epidural steroid injections.   She was not seen again in our office until January 2009.  She was  complaining of right lower quadrant pain and rectal bleeding.  She was  now 182 pounds.  Laboratory evaluation showed a normal hemoglobin and  liver enzymes.  An attempt was made to perform her colonoscopy in  February 2009.  Because of tortuosity of her sigmoid colon, her cecum  could not be intubated.  She had a double contrast barium enema which  suggested that there was a sigmoid colon polyp.  She had a repeat  sigmoidoscopy which did not show any evidence of sigmoid colon polyps.  The sigmoidoscopy was performed in March 2009.  Because of her continued  abdominal pain, a small bowel follow-through was performed which showed  small bowel transit time approximately two hours, normal caliber, no  strictures visualized.   She was started on Amitiza for her constipation and said that Amitiza  caused her to have cramps.  Her bowel movements are like little pebbles.  She uses a stool softener.  She has never had any problems with milk of  magnesia.  She eats bran, apples, greens and broccoli regularly.  She  drinks 48 ounces of water daily.  Her appetite is good.  She states she  feels full fast.  She easily feels stuffed.  She says when she eats she  has pain  about five minutes later and her pain lasts for about one hour.   MEDICATIONS:  1. Synthroid.  2. Imdur.  3. Furosemide.  4. Neurontin.  5. Stool softener.  6. Verapamil.  7. Methadone 10 mg daily.  8. Toprol XL.  9. Lanoxin.  10.Flexeril.  11.Zocor.  12.Aspirin.  13.Vitamin D.  14.Vitamin C.   OBJECTIVE:  VITAL SIGNS:  Weight 187 pounds (up 5 pounds since January  2009).  BMI 29.3 (overweight).  Temperature 98.3 degrees, blood pressure  130/86, pulse 64.  GENERAL:  She is in no apparent distress, alert and oriented x4.  LUNGS:  Clear to auscultation bilaterally.CARDIOVASCULAR:  A regular rhythm with  no murmurs. ABDOMEN:  Bowel sounds present, soft, nondistended.  Mild  tenderness to palpation in the right lower quadrant and the right upper  quadrant without rebound or guarding.  NEUROLOGIC:  She has no focal  neurological deficits.   ASSESSMENT:  Ms. Barber is a 60 year old female who has had dyspepsia  since at least 1985.  She has been complaining of abdominal pain and  bowel irregularity since 1997. She has had an extensive workup which has  revealed no other etiology for her symptoms, except for a functional gut  disorder.  Thank you for allowing me to see Ms. Cerullo in consultation.  My recommendations to follow.   RECOMMENDATIONS:  1. She is given her discharge instructions in writing.  2. She should drink at least six to eight cups of water a day.  3. She should follow a high-fiber diet.  She is given a high-fiber      handout.  She is cautioned that some fiber may cause bloating.  If      so, she should avoid it.  4. She is to add Citrucel and her equivalent fiber supplement once      daily.  5. She is to add milk of magnesia tab twice daily.  6. She is to try Digestive Advantage Constipation daily.  7. Return patient visit in six weeks.  If her symptoms persist, would      consider gastric emptying study.       Kassie Mends, M.D.  Electronically  Signed     SM/MEDQ  D:  10/28/2007  T:  10/28/2007  Job:  [id number]   cc:   Kingsley Callander. Ouida Sills, MD

## 2010-12-22 ENCOUNTER — Ambulatory Visit (HOSPITAL_COMMUNITY)
Admission: RE | Admit: 2010-12-22 | Discharge: 2010-12-22 | Disposition: A | Discharge status: Home / Self Care | Payer: BC Managed Care – PPO | Source: Ambulatory Visit | Attending: Gastroenterology | Admitting: Gastroenterology

## 2010-12-22 ENCOUNTER — Encounter: Payer: BC Managed Care – PPO | Admitting: Gastroenterology

## 2010-12-22 ENCOUNTER — Other Ambulatory Visit: Payer: Self-pay | Admitting: Gastroenterology

## 2010-12-22 DIAGNOSIS — R1310 Dysphagia, unspecified: Secondary | ICD-10-CM

## 2010-12-22 DIAGNOSIS — K2960 Other gastritis without bleeding: Secondary | ICD-10-CM

## 2010-12-22 DIAGNOSIS — K222 Esophageal obstruction: Secondary | ICD-10-CM | POA: Insufficient documentation

## 2010-12-22 DIAGNOSIS — J4489 Other specified chronic obstructive pulmonary disease: Secondary | ICD-10-CM | POA: Insufficient documentation

## 2010-12-22 DIAGNOSIS — I10 Essential (primary) hypertension: Secondary | ICD-10-CM | POA: Insufficient documentation

## 2010-12-22 DIAGNOSIS — Z79899 Other long term (current) drug therapy: Secondary | ICD-10-CM | POA: Insufficient documentation

## 2010-12-22 DIAGNOSIS — J449 Chronic obstructive pulmonary disease, unspecified: Secondary | ICD-10-CM | POA: Insufficient documentation

## 2010-12-22 NOTE — Procedures (Signed)
Selena [name], Selena [name]               ACCOUNT NO.:  [id number]   MEDICAL RECORD NO.:  [id number]          PATIENT TYPE:  INP   LOCATION:  A215                          FACILITY:  [facility]   PHYSICIAN:  Edward L. Juanetta Gosling, M.D.DATE OF BIRTH:  12/21/1950   DATE OF PROCEDURE:  04/01/2005  DATE OF DISCHARGE:                                EKG INTERPRETATION   TIME OF STUDY:  1305 hours on April 01, 2005.   RESULTS:  The rhythm is what appears to be mostly sinus tachycardia, but  with significant changes in the rate.  There are at least several PACs.  There is left atrial enlargement.  There is a suggestion of chronic  pulmonary disease.  QT interval is prolonged which may indicate drug effect,  primary myocardial disease or electrolyte imbalance.   IMPRESSION:  Abnormal electrocardiogram.      Edward L. Juanetta Gosling, M.D.  Electronically Signed     ELH/MEDQ  D:  04/02/2005  T:  04/02/2005  Job:  [id number]

## 2010-12-22 NOTE — Discharge Summary (Signed)
[name], Selena [name]               ACCOUNT NO.:  [id number]   MEDICAL RECORD NO.:  [id number]          PATIENT TYPE:  INP   LOCATION:  A215                          FACILITY:  [facility]   PHYSICIAN:  Kingsley Callander. Ouida Sills, MD       DATE OF BIRTH:  02-23-1951   DATE OF ADMISSION:  04/01/2005  DATE OF DISCHARGE:  08/30/2006LH                                 DISCHARGE SUMMARY   DISCHARGE DIAGNOSES:  1.  Supraventricular tachycardia.  2.  Chest pain  3.  Hypothyroidism.  4.  Chronic back pain.  5.  Gastroesophageal reflux disease.  6.  Hyperlipidemia.  7.  Aortic sclerosis.   HOSPITAL COURSE:  This patient is a 60 year old, white female who presented  to the emergency room with chest pain.  She was tachycardic in the 130s. She  was hospitalized in a monitored bed and treated with IV heparin,  nitroglycerin and oral metoprolol.  She had heart rate variability from the  30s to the 130s with intermittent episodes of SVT.  She was seen in  consultation by cardiology.  Cardiac enzymes were negative.  She initially  had some chest wall pain also which was treated with indomethacin.  An  echocardiogram revealed mild aortic sclerosis, normal left ventricular  function and borderline left ventricular hypertrophy.  There was a very  small segment in the base of the inferior wall showing myocardial thinning.  She underwent a Myoview stress test which revealed no ischemia.  She had  previously had a cardiac catheterization last year which had revealed no  coronary artery disease.   Digoxin was added.  Metoprolol was switched to the Toprol XL for at 25 mg  per day.  An outpatient Holter monitor will be arranged and she will be seen  in cardiology follow-up after this.  She may require referral for  electrophysiology study.   She was euthyroid with a TSH of 1.01.   She had a mildly elevated AST of 48, but a normal ALT of 25.   DISCHARGE MEDICATIONS:  1.  Digoxin 0.25 mg daily.  2.  Toprol XL 25 mg  daily.  3.  Lasix 40 mg daily.  4.  Norvasc 5 mg daily.  5.  Aspirin 81 mg daily.  6.  Synthroid 125 mcg daily.  7.  Ambien CR 12.5 mg nightly p.r.n.  8.  Methadone 10 mg b.i.d.  9.  Neurontin 600 mg q.i.d.  10. Crestor 5 mg daily.  11. Vitamin B12 500 mg daily.  12. Vitamin C 1000 mg daily.  13. Biotin daily.  14. Potassium daily.   FOLLOW UP:  The patient will be seen in my office in 2 weeks and cardiology  follow-up will be arranged.      Kingsley Callander. Ouida Sills, MD  Electronically Signed     ROF/MEDQ  D:  04/05/2005  T:  04/05/2005  Job:  [id number]   cc:   [place] Bing, M.D. [facility]  [address]  [address]  [place]  [place] [address]

## 2010-12-22 NOTE — Procedures (Signed)
[name], [name]               ACCOUNT NO.:  [id number]   MEDICAL RECORD NO.:  [id number]          PATIENT TYPE:  INP   LOCATION:  A215                          FACILITY:  [facility]   PHYSICIAN:  [place] Bing, M.D. [facility] OF BIRTH:  [birth year]   DATE OF PROCEDURE:  04/03/2005  DATE OF DISCHARGE:                                  ECHOCARDIOGRAM   REFERRING PHYSICIAN:  Drs. Ouida Sills and Rothbart   CLINICAL DATA:  [age]-year-old woman with chest pain and SVT.   M-MODE:  Aorta 2.8, left atrium 3.6, septum 1.2, posterior wall 1.2, LV  diastole 5.2, LV systole 3.8.   1.  Technically adequate echocardiographic study.  2.  Mild left atrial enlargement; normal right atrium and right ventricle.  3.  Normal mitral valve; minimal regurgitation.  4.  Mild aortic valvular sclerosis; mild calcification of the proximal      ascending aorta; very mild insufficiency.  5.  Normal tricuspid valve; trace regurgitation.  6.  Normal pulmonic valve and proximal pulmonary artery.  7.  Left ventricular size at the upper limit of normal; borderline      hypertrophy; very small segment at the base of the inferior wall with      myocardial thinning; overall left ventricular systolic function is      normal.  8.  Normal inferior vena cava.      [place] Bing, M.D. [facility]  Electronically Signed     RR/MEDQ  D:  04/03/2005  T:  04/03/2005  Job:  [id number]

## 2010-12-22 NOTE — Discharge Summary (Signed)
NAME:  Selena [name], Selena [name]                         ACCOUNT NO.:  [id number]   MEDICAL RECORD NO.:  [id number]                   PATIENT TYPE:  INP   LOCATION:  3703                                 FACILITY:  [facility]   PHYSICIAN:  Pricilla Riffle, M.D.                 DATE OF BIRTH:  04-17-1951   DATE OF ADMISSION:  03/21/2004  DATE OF DISCHARGE:  03/23/2004                                 DISCHARGE SUMMARY   PRIMARY CARDIOLOGIST:  Dr. Dionicio Stall.   PRIMARY CARE PHYSICIAN:  Dr. Carylon Perches in [place], [place].   DISCHARGING PHYSICIAN:  Dr. Dietrich Pates.   PROBLEM LIST:  1. Chest discomfort typical/atypical features.  2. Multiple cardiac risk factors including hypertension, longstanding     tobacco abuse.  3. History of mitral valve prolapse.  A 2-D echocardiogram May 2000 with     mild left ventricular hypertrophy/normal left ventricular function.  No     obvious mitral valve prolapse.  Mild aortic insufficiency.  4. Recent tachypalpitations.  5. History of syncope, probably neurocardiogenic etiology.  6. Peptic ulcer disease/gastroesophageal reflux disease.  7. Treated hypothyroidism.  8. 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  9. Reliance on chronic narcotic use for degenerative joint disease.   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   HISTORY OF PRESENT ILLNESS:  Selena [name] presented to Dr. Marchelle Folks office in  [place] with ongoing chest discomfort with a very marked abnormal  electrocardiogram.  He was concerned she was evolving non-ST-segment-  elevation myocardial infarction.  After speaking with his colleagues at  [facility] in the catheterization laboratory, the patient was sent  directly there for further evaluation.  Also noted that the patient had a  CONTRAST ALLERGY and was treated presumptively with IV Solu-Medrol and  Benadryl.  The patient was also started on IV heparin and nitroglycerin,  normal saline in preparation for cardiac catheterization.  Initially, her  troponin on March 21, 2004 here was 0.01 and then on August 17 at midnight  it was 0.11.  The patient transferred to [place] with cardiac admission orders  initiated a nitroglycerin and heparin drip per pharmacy protocol, 325 mg of  aspirin, the patient kept n.p.o. for cardiac catheterization.  As stated  above, treated with Benadryl and Solu-Medrol.  The patient taken to cardiac  catheterization laboratory by Dr. Charlies Constable.  Results of cardiac  catheterization showed normal coronary angiography, abnormal  electrocardiogram, questionable apical wall hypokinesis to akinesis of  uncertain etiology.  The overall ejection fraction was good, estimated at 55-60%.  Right coronary  artery intact.  Circumflex artery was free of significant disease.  The left  anterior descending artery   Dictation ended at this point.      Dorian Pod, NP  Pricilla Riffle, M.D.    MB/MEDQ  D:  03/23/2004  T:  03/24/2004  Job:  [id number]

## 2010-12-22 NOTE — Discharge Summary (Signed)
NAME:  Selena [name], Selena [name]                         ACCOUNT NO.:  [id number]   MEDICAL RECORD NO.:  [id number]                   PATIENT TYPE:  INP   LOCATION:  3703                                 FACILITY:  [facility]   PHYSICIAN:  Dorian Pod, NP                 DATE OF BIRTH:  1950/11/04   DATE OF ADMISSION:  03/21/2004  DATE OF DISCHARGE:  03/23/2004                                 DISCHARGE SUMMARY   DISCHARGE PHYSICIAN:  Pricilla Riffle, M.D.   PRIMARY CARE PHYSICIAN:  Kingsley Callander. Ouida Sills, M.D.   PRIMARY CARDIOLOGIST:  Vida Roller, M.D.   DISCHARGE DIAGNOSES:  1. Probable NSTEMI with normal cardiac catheterization.  2. Questionable coronary spasms.  3. Increased lipids.  4. Gastroesophageal reflux disease.  5. Positive tobacco abuse.  6. Supraventricular tachycardia.  7. Chronic back pain related to degenerative joint disease.  8. Recurring chronic narcotic use.  9. Hypertension.   HISTORY OF PRESENT ILLNESS:  This is a 60 year old Caucasian female who  presented to Dr. Dorethea Clan at the [place] office complaining of ongoing  chest discomfort with a very marked abnormally electrocardiogram with  evidence of inverted T waves in leads V3, V4, V5 and V6.  This is completely  new from her previous electrocardiogram in November 2004.  She does also  have an incomplete right bundle branch and a left atrial enlargement that is  old.  Dr. Dorethea Clan was worried that she was having an involving non ST segment  elevated myocardial infarction.  He contacted [facility] at [facility] in the Catheter Lab, and the patient was sent directly there  for further evaluation. Of note is the fact that the patient also has  contrast allergy and was prepared for emergency heart catheterization with  IV Solu-Medrol and IV Benadryl.   HOSPITAL COURSE:  At this point, IV heparin and IV nitroglycerin as well as  an IV normal saline drip were initiated in regards to her chest pain.  The  patient was transferred to [facility] by [facility] and taken to the  cardiac catheterization lab by Dr. Charlies Constable.  Cardiac catheterization  showed left main coronary artery free of significant disease, left anterior  descending artery free of significant disease, circumflex artery free of  significant disease, right coronary artery is a nondominant vessel that  supplied only right ventricular branches.  The left ventricle appeared that  there was hypokinesis to akinesis at the tip of the apex.  The rest of the  wall motion was good, and the overall ejection fraction was good and  estimated at 55-60%.  In conclusion, Dr. Juanda Chance felt that this was a normal  cardiac catheterization with an abnormal electrocardiogram and questionable  apical wall hypokinesis to akinesis of uncertain etiology.  Recommendation  was for follow up with a repeat echocardiogram in several weeks and to  initiate calcium channel blocker for prevention  of possible spasms and to  treat the patient with aspirin.  Patient tolerated cardiac catheterization  well and was placed on telemetry unit for observation.  It was noted that  the patient did have some burst of SVT during this admission.  The patient  began treatment with Cardizem 180 mg 1 p.o. daily and aspirin 325 mg.  Today  on August 18, after the patient was seen by Dr. Dietrich Pates, it was felt that  the patient was stable to go home with plans for follow up echocardiogram in  4-6 weeks to reevaluate the cardiac apex and to follow up with Dr. Antoine Poche  in 3 weeks at the office.  Possibly may need a vent monitor if the patient  continues to have SVT on the Cardizem.  Also, questionable follow up maybe  with EP evaluation for the SVT, and at some point, the patient had an 8 beat  run of [facility] during this admission.  Otherwise, blood pressure 135-168/77,  pulse 50-70.  Troponin evaluation done this admission with 0.01-0.11 level,  MB 1.9-2, CK 165-132.   Lungs were clear to auscultation.  Cardiac regular  rate and rhythm.  Plus cath site, right groin, without hematoma or bruit.  h   DISPOSITION:  Patient being discharged home at this time.   DISCHARGE MEDICATIONS:  1. Prescription for Cardizem CD 180 mg.  2. Patient instructed to take between 4-6 p.m. daily aspirin 325 mg.  3. Patient to continue all previous medications prior to this admission.   PAIN MANAGEMENT:  Pain management as prior to this admission in regards to  her chronic degenerative joint disease and Tylenol p.r.n. for general  discomfort.  The patient was instructed no driving or strenuous activity for  two days.  No lifting over 10 pounds x1 week.   WOUND CARE:  The patient to gently clean cath site with soap and water.  No  tub bathing or swimming x1 week.  Patient instructed to follow up with Dr.  Dorethea Clan in 3 weeks at the [facility] office and to call office for any fever  greater than 101, pain or swelling from cath site.  The patient verbalizes  understanding of these discharge instructions.   FOLLOWUP:  Follow up with Dr. Dorethea Clan to have echocardiogram done in 4-6  weeks to reevaluate apex.                                                Dorian Pod, NP    MB/MEDQ  D:  03/23/2004  T:  03/24/2004  Job:  [id number]

## 2010-12-22 NOTE — H&P (Signed)
NAMEYURIANA, Selena [name]               ACCOUNT NO.:  [id number]   MEDICAL RECORD NO.:  [id number]           PATIENT TYPE:  INP   LOCATION:  A215                          FACILITY:  [facility]   PHYSICIAN:  Kingsley Callander. Ouida Sills, MD       DATE OF BIRTH:  [birth year]   DATE OF ADMISSION:  04/01/2005  DATE OF DISCHARGE:  LH                                HISTORY & PHYSICAL   HISTORY OF PRESENT ILLNESS:  This patient is a 60 year old white female who  presented to the emergency room with a one-day history of substernal chest  pain.  She had experienced diaphoresis.  She denied vomiting or shortness of  breath.  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  The discomfort persisted overnight and into  Sunday, resulting in her emergency room visit.  She was evaluated at the  emergency room where point-of-care markers were negative. She was initially  tachycardic, though, in the 130s.  She was subsequently hospitalized by Dr.  Janna Arch and treated with IV heparin and IV nitroglycerin.  She had  previously undergone a cardiac catheterization in 2005 which revealed normal  coronary arteries.  She had hypokinesis of the apex and was felt to possibly  have coronary spasm.  She denies palpitations now.   PAST MEDICAL HISTORY:  1.  Cardiac catheterization in 2005 revealing normal coronary arteries.  2.  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  3.  GERD.  4.  Hernia repair.  5.  Hypothyroidism/Hashimoto's thyroiditis.  6.  Aortic sclerosis.  7.  Hyperlipidemia.  8.  History of iron-deficiency anemia.  9.  History of edema.   MEDICATIONS:  1.  Lasix 40 mg daily.  2.  Methadone 10 mg t.i.d.  3.  Synthroid 125 mcg daily.  4.  Neurontin 600 mg t.i.d.  5.  Multivitamins daily.  6.  Aspirin 325 mg daily.  7.  Imdur 30 mg daily.  8.  Valium 10 mg nightly, 5 mg b.i.d.   ALLERGIES:  PENICILLIN, NUBAIN, and CT DYE.   FAMILY HISTORY:   Unknown.   SOCIAL HISTORY:  She does not smoke or drink.   REVIEW OF SYSTEMS:  Noncontributory.   PHYSICAL EXAMINATION:  GENERAL: Alert, comfortable, and oriented.  HEENT:  Pupils equal, round, and reactive.  Pharynx unremarkable.  NECK:  No palpable thyromegaly or lymphadenopathy.  LUNGS:  Clear.  HEART:  Bradycardic with no murmurs.  ABDOMEN:  Soft, nontender.  No hepatosplenomegaly.  EXTREMITIES:  No cyanosis, clubbing, or edema.  NEUROLOGIC:  At baseline.   LABORATORY DATA:  White count 7.3, hemoglobin 13.4, platelets 270.  Sodium  140, potassium 4, glucose 139, BUN 10, creatinine 1, SGOT 48, SGPT 25,  albumin 4.1, calcium 9.3.  CK 99, troponin I 0.01.   EKG reveals sinus tachycardia.   Her monitoring now shows sinus bradycardia at 48.   Chest x-ray is negative.   IMPRESSION:  1.  Chest pain.  I believe the IV heparin and IV nitroglycerin can be  stopped now.  I would like for the cardiologist to consult on her case,      though, in light of her fluctuating heart rates from the 40s to the      130s.  2.  Hypothyroidism.  Continue Synthroid.  3.  Chronic pain syndrome.  Continue methadone.  4.  Elevated SGOT (48).  Will follow.      Kingsley Callander. Ouida Sills, MD  Electronically Signed     ROF/MEDQ  D:  04/02/2005  T:  04/02/2005  Job:  [id number]

## 2010-12-22 NOTE — Procedures (Signed)
[name], Selena [name]               ACCOUNT NO.:  [id number]   MEDICAL RECORD NO.:  [id number]          PATIENT TYPE:  EMS   LOCATION:  ED                            FACILITY:  [facility]   PHYSICIAN:  Edward L. Juanetta Gosling, M.D.DATE OF BIRTH:  1950/09/02   DATE OF PROCEDURE:  11/26/2005  DATE OF DISCHARGE:  11/26/2005                                EKG INTERPRETATION   TIME:  1300 November 26, 2005   The rhythm is sinus rhythm rate with a rate in the 70s.  There is at least 1  PVC.  The axis is indeterminate.  There seems to be right atrial  enlargement.  Abnormal electrocardiogram.      Oneal Deputy. Juanetta Gosling, M.D.  Electronically Signed     ELH/MEDQ  D:  11/27/2005  T:  11/28/2005  Job:  [id number]

## 2010-12-22 NOTE — Consult Note (Signed)
Selena [name], Selena [name]               ACCOUNT NO.:  [id number]   MEDICAL RECORD NO.:  [id number]          PATIENT TYPE:  INP   LOCATION:  A215                          FACILITY:  [facility]   PHYSICIAN:  [place] Bing, M.D. [facility] OF BIRTH:  Aug 10, 1950   DATE OF CONSULTATION:  04/02/2005  DATE OF DISCHARGE:                                   CONSULTATION   CARDIOLOGY CONSULTATION:   DATE OF CONSULTATION:  April 02, 2005   REFERRING PHYSICIAN:  Dr. Ouida Sills   PRIMARY CARDIOLOGIST:  Dr. Dorethea Clan   HISTORY OF PRESENT ILLNESS:  [age]-year-old woman admitted to hospital  with chest discomfort radiating to the left arm. Selena [name] history dates  back a few years when she presented with chest discomfort and palpitations.  She initially did well on medical therapy, but subsequently returned with  increased symptoms prompting coronary angiography in August 2005, which  revealed no atherosclerotic disease but an inferior wall motion abnormality.  The possibility of coronary spasm was entertained. She was started on  nitrates and calcium channel blockers with an initially good response. Chest  discomfort recurred a few days ago with a similar quality and intensity to  her previous episode. She also has had paroxysms of SVT in the past, which  have again been documented this admission. She believes that these also  subsided in the interim between now and her previous hospitalization. Her  symptoms are now fairly constant. There is no increase with movement of the  trunk or arms. She did have some tenderness to palpation this morning, but  she attributes that to a prior vertebral fracture with continuing weakness  in that region of her back.   PAST MEDICAL HISTORY:  Past medical history is otherwise notable for GERD,  hypertension, DJD and hypothyroidism.   MEDICATIONS:  Medications prior to admission included methadone 10 mg  b.i.d., furosemide 40 mg daily, Neurontin 600 mg q.i.d.,  rosuvastatin 5 mg  daily, amlodipine 5 mg daily, aspirin 81 mg daily, levothyroxine 0.125 mg  daily, isosorbide mononitrate 30 mg daily, vitamin B12,  potassium and  Ambien.   SOCIAL HISTORY:  Unemployed; 23 pack-year history of cigarettes use was  discontinued earlier this year. No excessive use of alcohol.   FAMILY HISTORY:  Sketchy; some heart disease in her siblings.   REVIEW OF SYSTEMS:  Notable for fever and chills the last week, recurrent  palpitations and chronic back pain. Other systems reviewed and are negative.   EXAMINATION:  GENERAL:  On exam, pleasant woman in no acute distress.  VITAL SIGNS:  Temperature is 98.2, heart rate 50 and regular, respirations  20, blood pressure 120/85, O2 saturation 95% on room air, weight 172.  HEENT:  Anicteric sclerae; normal lids and conjunctivae. Moist oral mucosa.  NECK:  No jugular venous distension; normal carotid upstrokes without  bruits.  ENDOCRINE:  No thyromegaly.  HEMATOPOIETIC:  No adenopathy.  LUNGS:  Clear.  CARDIAC:  Normal first and second heart sounds; modest systolic murmur;  normal PMI.  ABDOMEN:  Soft and nontender; no organomegaly; no masses; aortic pulsation  not palpable.  EXTREMITIES:  Distal pulses intact; no edema.  NEUROMUSCULAR:  Normal cranial nerves; symmetric strength and tone.  MUSCULOSKELETAL:  No joint deformities.   EKG:  Sinus tachycardia with frequent PACs; left atrial abnormality;  borderline low voltage; rightward axis; somewhat delayed R-wave progression;  nonspecific ST-segment abnormality.   TELEMETRY:  Sinus bradycardia alternating with PSVT at a rate of 145.   LABORATORIES:  Other labs notable for normal CBC, normal chemistry profile,  normal hepatic profile, normal cardiac markers and normal coagulopathy  studies.   IMPRESSION:  Selena [name] returns, again with somewhat atypical chest  discomfort. With normal coronary angiography 1 year ago, the likelihood of  atherosclerotic disease is  low. Similarly, under treatment with calcium  channel antagonists and nitrates, the likelihood of spasm is low. Syndrome X  is a possibility, but her symptoms are not clearly exertional. A chest wall  issue or noncardiac source of chest discomfort is certainly also a  possibility. She is already being treated with nonsteroidals. We will  proceed with an echocardiogram and a stress nuclear study. I doubt that  repeat coronary angiography will be necessary.   Her brief paroxysms of supraventricular tachycardia appear to be not causing  symptoms. Treatment options are limited due to her sick sinus syndrome. The  use of amiodarone, sotalol or beta-blocker likely would not be tolerated due  to bradycardia. Flecainide is a pharmaceutical option. I will discuss her  situation with one of electrophysiology specialist.      [place] Bing, M.D. [facility]  Electronically Signed     RR/MEDQ  D:  04/02/2005  T:  04/02/2005  Job:  [id number]

## 2010-12-22 NOTE — Cardiovascular Report (Signed)
NAME:  Selena [name], Selena [name]                         ACCOUNT NO.:  [id number]   MEDICAL RECORD NO.:  [id number]                   PATIENT TYPE:  INP   LOCATION:  3703                                 FACILITY:  [facility]   PHYSICIAN:  Charlies Constable, M.D. [facility]              DATE OF BIRTH:  1951/04/17   DATE OF PROCEDURE:  03/21/2004  DATE OF DISCHARGE:                              CARDIAC CATHETERIZATION   CLINICAL HISTORY:  Selena [name] is 60 years old and has no prior history of  known heart disease.  Selena [name] does have hypertension, hyperlipidemia, and  continued tobacco use.  Selena [name] also has chronic low back pain, for which Selena [name]  takes methadone.  Selena [name] had been having chest pain for two weeks and it became  worse today, and Selena [name] was seen by Dr. Dorethea Clan in the office with ongoing pain  and an EKG showing new anterolateral T-wave inversions.  Selena [name] was brought to  [facility] and taken directly to the catheterization lab, where I evaluated  Selena [name] and proceeded with angiography.   PROCEDURE:  Through the right femoral artery using an arterial sheath and 6  [place] preformed coronary catheters, a front wall arterial puncture was  performed and Omnipaque contrast was used.  The right femoral artery was  closed with an Angioseal at the end of the procedure.  The patient tolerated  the procedure well and left the laboratory in satisfactory condition.   RESULTS:  1. Left main coronary artery:  The left main coronary artery is free of     significant disease.  2. Left anterior descending artery:  The left anterior descending gave rise     to three septal perforators and a diagonal branch.  These and the LAD     proper were free of significant disease.  3. Circumflex artery:  The circumflex artery gave rise to a ramus branch,     two marginal branches, a posterolateral branch, and a posterior     descending branch.  These also were free of significant disease.  This     was a dominant vessel.  4. Right coronary artery:   The right coronary artery is a nondominant vessel     that supplied only right ventricular branches.  5. Left ventriculogram:  The left ventriculogram performed in the RAO     projection was somewhat difficult to assess due to ventricular ectopy,     but it appeared that there was hypokinesis to akinesis at the tip of the     apex.  The rest of the wall motion was good, and the overall ejection     fraction was good and estimated at 55-60%.   CONCLUSION:  1. Normal coronary angiography.  2. Abnormal electrocardiogram and questionable apical wall hypokinesis to     akinesis of uncertain etiology.   RECOMMENDATIONS:  Selena [name] pain was impressive and Selena [name] EKG was  impressive, and it appeared that  Selena [name] may have a wall motion abnormality at  the apex despite normal coronary angiography.  Will plan an echo to further  define Selena [name] wall motion abnormality, and will treat Selena [name] with aspirin and  Norvasc for prevention of possible spasm.   ADDENDUM:  The aortic pressure was 126/100 with a mean of 129.  Left main  pressure is 176/16.                                               Charlies Constable, M.D. [facility]    BB/MEDQ  D:  03/21/2004  T:  03/22/2004  Job:  [id number]   cc:   Kingsley Callander. Ouida Sills, M.D.  [address]  [place]  [place] [address]  Fax: [phone]   Randlett Bing, M.D.   Vida Roller, M.D.  Fax: [phone]   Cardiopulmonary Lab

## 2010-12-28 ENCOUNTER — Telehealth: Payer: Self-pay

## 2010-12-28 NOTE — Telephone Encounter (Signed)
Pt said she spoke to pharmacist about something to coat her stomach and he suggested Carafate. She would like to know if she could get rx for that. Please advise!

## 2010-12-28 NOTE — Telephone Encounter (Signed)
Please call pt. Carafate will not help her Sx. It is FDA approved for managing duodenal ulcers and may show benefit with gastric ulcers and severe gastritis, which she does not have. She has mildgastritis and if she needs additional relief she can use Maalox, Mylanta, or TUMS.

## 2010-12-28 NOTE — Telephone Encounter (Signed)
Pt was informed.

## 2011-01-23 NOTE — Op Note (Signed)
  NAMEMARLITA, Selena [name]               ACCOUNT NO.:  [id number]  MEDICAL RECORD NO.:  [id number]           PATIENT TYPE:  O  LOCATION:  DAYP                          FACILITY:  [facility]  PHYSICIAN:  Jonette Eva, M.D.     DATE OF BIRTH:  July 31, 1951  DATE OF PROCEDURE:  12/22/2010 DATE OF DISCHARGE:                              OPERATIVE REPORT   REFERRING PHYSICIAN:  Kingsley Callander. Ouida Sills, MD  PROCEDURE:  Esophagogastroduodenoscopy, cold forceps biopsy of the gastric mucosa, and Savary dilation to 16 mm.  INDICATION FOR EXAM:  Selena [name] is a 60 year old female who presents with solid dysphagia.  She regularly uses bc powders for headaches.  FINDINGS: 1. 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  Esophagus was dilated from 12.8     to 16 mm.  The dilators passed with mild resistance.  Otherwise no     evidence of Barrett's erosions, ulcerations or mass. 2. Multiple antral erosions.  Biopsies obtained via cold forceps to     evaluate for H. pylori gastritis.3. Normal duodenal bulb and second portion of the duodenum with     moderate bile staining.  DIAGNOSES: 1. 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  The     differential diagnosis includes nonspecific esophageal motility     disorder. 2. Moderate erosive gastritis.  RECOMMENDATIONS: 1. The patient was given a work excuse on the day. 2. We will call with results of her biopsies. 3. Follow up in 3 months regarding her pain and her dysphagia.  If her     dysphagia persists, she will need a barium pill esophagram. 4. No aspirin or NSAIDs for 30 days.  No anticoagulation for 5 days. 5. She should follow a high-fiber diet.  She was given a handout on     high-fiber diet and gastritis.  MEDICATIONS:  Propofol provided by anesthesia.  PROCEDURE TECHNIQUE:  Physical exam was formed.  Informed consent was obtained from the patient after explaining the benefits, risks and alternatives to the procedure.  The patient was connected to the  monitor and placed in left lateral position.  Continuous oxygen was provided by nasal cannula and IV medicine administered through an indwelling cannula.  After administration of sedation, the patient's esophagus was intubated and the scope was advanced under direct visualization to the second portion of the duodenum.  The scope was removed slowly by carefully examining the color, texture, anatomy, and integrity of the mucosa on the way out.  Prior to withdrawal of the scope retroflexed view of the cardia was performed.  The Savary guidewire was introduced.  The esophagus was dilated from 12.8 to 16 mm. The dilators passed with mild resistance.  The dilator and the guide wire were removed.  The patient was recovered in endoscopy and discharged home in satisfactory condition.  PATH: MILD GASTRITIS-Stop using BC powders. CONTINUE PROTONIX.   Jonette Eva, M.D.  CC: Carylon Perches, M.D.   SF/MEDQ  D:  12/22/2010  T:  12/23/2010  Job:  [id number]  Electronically Signed by Jonette Eva M.D. on 01/23/2011 01:24:09 PM

## 2011-02-01 ENCOUNTER — Telehealth: Payer: Self-pay | Admitting: Urgent Care

## 2011-02-01 NOTE — Telephone Encounter (Signed)
Please call pt. Her insurance will not cover protonix unless she has failed nexium. Trial Nexium 40mg  daily, #31, 11 RF Thanks

## 2011-02-01 NOTE — Telephone Encounter (Signed)
rx called to [facility]

## 2011-03-21 ENCOUNTER — Ambulatory Visit: Payer: BC Managed Care – PPO | Admitting: Gastroenterology

## 2011-03-21 ENCOUNTER — Telehealth: Payer: Self-pay | Admitting: Gastroenterology

## 2011-03-21 NOTE — Telephone Encounter (Signed)
noted 

## 2011-04-11 ENCOUNTER — Telehealth: Payer: Self-pay

## 2011-04-11 NOTE — Telephone Encounter (Signed)
Patients would like to know if we can give her a prescription for Protonix 40 mg. Her insurance stop paying for this medication so she wants to know if we could reorder this medication for her. She goes to [facility] in [place]. Please advised. Marland Kitchen

## 2011-04-12 ENCOUNTER — Telehealth: Payer: Self-pay

## 2011-04-12 NOTE — Telephone Encounter (Signed)
PA has been started

## 2011-04-12 NOTE — Telephone Encounter (Signed)
Please call pt. She may have Protonix 1 po 30 minutes prior to her first meal #30, rfx5. She needs TCS w/ propofol/overtube at [facility] before the end of 2012, Dx: tortuous colon, averager risk clon cancer screening.

## 2011-04-12 NOTE — Telephone Encounter (Signed)
Pt's husband called and said that he has found out that his wife needs PA on the Nexium. He insisted on leaving a number to call [employer], [phone]. I told him to please have the pharmacy [facility]) fax over the paper work for requesting the PA.

## 2011-04-12 NOTE — Telephone Encounter (Signed)
Can you please schedule this. Raynelle Fanning is doing PA for nexium.

## 2011-04-12 NOTE — Telephone Encounter (Signed)
Referral faxed to [facility] 

## 2011-05-07 LAB — CK TOTAL AND CKMB (NOT AT ARMC)
CK, MB: 2.3
CK, MB: 2.4
Relative Index: INVALID
Relative Index: INVALID
Total CK: 82
Total CK: 95

## 2011-05-07 LAB — DIFFERENTIAL
Basophils Absolute: 0
Basophils Absolute: 0
Basophils Relative: 1
Eosinophils Absolute: 0.4
Eosinophils Relative: 6 — ABNORMAL HIGH
Lymphocytes Relative: 36
Lymphocytes Relative: 39
Monocytes Absolute: 0.7
Monocytes Absolute: 0.7
Neutro Abs: 2.8
Neutrophils Relative %: 46

## 2011-05-07 LAB — CBC
HCT: 42.7
Hemoglobin: 14.4
Hemoglobin: 12.9
MCHC: 34.3
MCV: 91.2
Platelets: 241
Platelets: 225
RDW: 13
RDW: 13.1

## 2011-05-07 LAB — GLUCOSE, CAPILLARY: Glucose-Capillary: 92

## 2011-05-07 LAB — COMPREHENSIVE METABOLIC PANEL
Albumin: 3.9
Alkaline Phosphatase: 73
BUN: 5 — ABNORMAL LOW
Chloride: 99
Creatinine, Ser: 0.72
Glucose, Bld: 98
Potassium: 4.3
Total Bilirubin: 0.5
Total Protein: 6.5

## 2011-05-07 LAB — TROPONIN I: Troponin I: 0.02

## 2011-05-07 LAB — LIPID PANEL
LDL Cholesterol: 80
VLDL: 59 — ABNORMAL HIGH

## 2011-05-07 LAB — RAPID URINE DRUG SCREEN, HOSP PERFORMED
Barbiturates: NOT DETECTED
Cocaine: NOT DETECTED
Opiates: NOT DETECTED

## 2011-05-07 LAB — POCT CARDIAC MARKERS: Troponin i, poc: <0.05

## 2011-11-01 ENCOUNTER — Other Ambulatory Visit (HOSPITAL_COMMUNITY): Payer: Self-pay | Admitting: Obstetrics & Gynecology

## 2011-11-01 DIAGNOSIS — Z139 Encounter for screening, unspecified: Secondary | ICD-10-CM

## 2011-12-10 ENCOUNTER — Ambulatory Visit (HOSPITAL_COMMUNITY): Payer: BC Managed Care – PPO

## 2012-01-07 ENCOUNTER — Ambulatory Visit (HOSPITAL_COMMUNITY)
Admission: RE | Admit: 2012-01-07 | Discharge: 2012-01-07 | Disposition: A | Discharge status: Home / Self Care | Payer: BC Managed Care – PPO | Source: Ambulatory Visit | Attending: Orthopedic Surgery | Admitting: Orthopedic Surgery

## 2012-01-07 DIAGNOSIS — M25569 Pain in unspecified knee: Secondary | ICD-10-CM | POA: Insufficient documentation

## 2012-01-07 DIAGNOSIS — R269 Unspecified abnormalities of gait and mobility: Secondary | ICD-10-CM | POA: Insufficient documentation

## 2012-01-07 DIAGNOSIS — M25559 Pain in unspecified hip: Secondary | ICD-10-CM | POA: Insufficient documentation

## 2012-01-07 DIAGNOSIS — M6281 Muscle weakness (generalized): Secondary | ICD-10-CM | POA: Insufficient documentation

## 2012-01-07 DIAGNOSIS — R262 Difficulty in walking, not elsewhere classified: Secondary | ICD-10-CM | POA: Insufficient documentation

## 2012-01-07 DIAGNOSIS — IMO0001 Reserved for inherently not codable concepts without codable children: Secondary | ICD-10-CM | POA: Insufficient documentation

## 2012-01-07 NOTE — Evaluation (Signed)
Physical Therapy Evaluation  Patient Details  Name: Selena [name] MRN: [id number] Date of Birth: 1951/05/19  Today's Date: 01/07/2012 Time: 0932-1020 PT Time Calculation (min): 48 min Charges: 1 eval, 1 ice Visit#: 1  of 8   Re-eval: 02/06/12 Assessment Diagnosis: R knee scope Surgical Date: 12/24/11 Next MD Visit: Dr. York Grice - 4 weeks Prior Therapy: For back pain.  Authorization:    Authorization Time Period:    Authorization Visit#:   of     Past Medical History:  Past Medical History  Diagnosis Date  . Cervical ca 1984  . Constipation   . Hemorrhoids   . HTN (hypertension)   . Depression     w/ menopause  . Hyperlipemia   . Hypothyroidism   . CAD (coronary artery disease)   . MI (myocardial infarction)   . Tachycardia     being evaluated recently by Dr. Alanda Amass  . TIA (transient ischemic attack) 1983  . GERD (gastroesophageal reflux disease)   . Bursitis   . Chronic abdominal pain     functional, seen @[facility] , extensive WU here benign   . Skin cancer     nose  . Chronic back pain     on methadone  . PUD (peptic ulcer disease) 1997/1998    negative h pylori  . Abnormal findings on imaging of biliary tract 1997-2000    CBD 21mm, Dr Danny Lawless biliary/pancreatic manometry, [facility] Dr. Christella Hartigan 09/18/07 dilated but otherwise normal extrahepatic duct, no masses  . S/P colonoscopy 09/10/07    Dr. Teddy Spike sec to tortuous colon, followed by [facility] & flex sig normal   Past Surgical History:  Past Surgical History  Procedure Date  . Partial hysterectomy   . Cholecystectomy   . Appendectomy   . Right arm     nerve  . Inguinal hernia repair     left  . Tonsillectomy     Subjective Symptoms/Limitations Symptoms: R knee pain, B hip pain, B shoulder pain, back pain, Hx of uterine and skin cancer.   Pertinent History: Pt reports recieving a R knee scope after having insidious onset of knee pain since February.  Today she reports she is in considerable amount of  pain, which she reports that feels like a knife is stabbing her.  She reports that after she saw the PA (she was not having much pain).  However the following weekend she sitting on the ground with her right leg extended (to paint her toenails) and she went to get up she bent her right knee and she felt a pop to her knee.  She states she will be calling Dr. Christian Mate as soon as he returns to work.  She reports she has taken all of her pain medication.  Alleviating: pain medication and ice; Aggrevating: Standing and Sitting How long can you sit comfortably?: able to sit with ice on her knee.  How long can you stand comfortably?: 3 minutes (less than 5 minutes) How long can you walk comfortably?: unable to walk comfortably. Patient Stated Goals: "I am looking forward to getting rid of some of this pain so that I can walk 2-3 miles and lose weight."  Pain Assessment Currently in Pain?: Yes Pain Score: 10-Worst pain ever (10) Pain Location: Knee Pain Orientation: Right Pain Type: Acute pain;Other (Comment)  Precautions/Restrictions  Precautions Precaution Comments: Hx of Cancer   Prior Functioning  Prior Function Able to Take Stairs?: Reciprically Driving: Yes Vocation: Full time employment Vocation Requirements: She works for the [employer]  and requries: walking, squatting, lifting, bending.  Leisure: Hobbies-yes (Comment) Comments: She enjoys walking 2-3 miles a day, visiting the [place], reading  Cognition/Observation Observation/Other Assessments Observations: Pt has difficulty becoming comfortable on the bed secondary to increased pain in her other joints.   Sensation/Coordination/Flexibility/Functional Tests Coordination Gross Motor Movements are Fluid and Coordinated: No Coordination and Movement Description: impaired RLE coordination with squating technique ( favors RLE) Functional Tests Functional Tests: Lower Extremity Functional Scale: 13/80 (LEFS)  Assessment LLE AROM  (degrees) Left Knee Extension 0-130: 0  Left Knee Flexion 0-140: 125  LLE Strength Left Hip Flexion: 3+/5 Left Hip Extension: 3+/5 Left Hip ABduction: 3+/5 Left Hip ADduction: 3+/5 Left Knee Flexion: 4/5 Left Knee Extension: 5/5  Mobility/Balance  Ambulation/Gait Ambulation/Gait: Yes Ambulation/Gait Assistance: 7: Independent Gait Pattern: Decreased stance time - right;Decreased hip/knee flexion - right;Decreased trunk rotation;Right foot flat Static Standing Balance Static Standing - Comment/# of Minutes: 10 sec maximum Single Leg Stance - Right Leg: 6  Single Leg Stance - Left Leg: 10  Tandem Stance - Right Leg: 5  Tandem Stance - Left Leg: 10  Rhomberg - Eyes Opened: 10  Rhomberg - Eyes Closed: 10    Exercise/Treatments Aerobic Stationary Bike: 6 min @ 1.0 for pain control Standing Heel Raises: 10 reps Knee Flexion: Right;10 reps Hip ADduction:  (5 sec) Functional Squat: 10 reps Seated Other Seated Knee Exercises: Hip isometric adduction 5x10 sec holds Other Seated Knee Exercises: Heel and Toe Roll in and outs 3x10 sec holds Supine Patellar Mobs: manually and education  Modalities Modalities: Cryotherapy Manual Therapy Manual Therapy: Joint mobilization Joint Mobilization: Grade I-II to R knee in supine w/knee flexed and patellar mobs all direction to decrease pain. Cryotherapy Number Minutes Cryotherapy: 10 Minutes Cryotherapy Location: Knee Type of Cryotherapy: Ice pack  Physical Therapy Assessment and Plan PT Assessment and Plan Clinical Impression Statement: Ms. Ruvalcaba is a 61 year old female referred to PT s/p R knee scope 2 weeks ago.   Pt will benefit from skilled therapeutic intervention in order to improve on the following deficits: Abnormal gait;Decreased activity tolerance;Pain;Decreased strength;Decreased coordination;Decreased balance Rehab Potential: Fair Clinical Impairments Affecting Rehab Potential: multiple joint pain in her hips and  shoulders, hx of cancer limits modality use PT Frequency: Min 2X/week PT Duration: 4 weeks PT Treatment/Interventions: Gait training;Stair training;Functional mobility training;Therapeutic activities;Therapeutic exercise;Balance training;Neuromuscular re-education;Patient/family education;Other (comment) PT Plan: NO MODALITIES (hx of cancer), start with bike, heel/toe raises, heel/toe roll in and outs, cybex (low weight) or LAQ/HS curls with band, squats, steps.  ALL WITHOUT INCREASING PAIN.  Manual therapy for pain control. Add as many seated exercises to decrease pain, pt is very uncomfrotable on tables because of back and hip pain.    Goals Home Exercise Program Pt will Perform Home Exercise Program: Independently PT Goal: Perform Home Exercise Program - Progress: Goal set today PT Short Term Goals Time to Complete Short Term Goals: 2 weeks PT Short Term Goal 1: Pt will report pain less than 5/10 to R knee. PT Short Term Goal 2: Pt will improve LE strength by 1/2 muscle grade to ambulate greater than 5 minutes without an increase in pain.  PT Short Term Goal 3: Pt will improve her R SLS x15 sec on static surface. PT Long Term Goals Time to Complete Long Term Goals: 4 weeks PT Long Term Goal 1: Pt will report pain less than 4/10 for 75% of her day for improved QOL. PT Long Term Goal 2: Pt will improve her LEFS  to 25/80 for improved percieved functional ability.  Long Term Goal 3: Pt will improve LE strength to WNL in order to ambulate for 20 minutes without an increase in pain to return to work out activities in order to lose weight.  Long Term Goal 4: Pt will improve LE functional strength and demonstrate 3 half kneel to stands with appropriate mechanics.   PT Long Term Goal 5: Pt will improve LE coordination and demonstrate appropriate squatting and lifting techniques to return to a physically demanding job.   Problem List Patient Active Problem List  Diagnoses  . Dysphagia  . Abnormal  finding of biliary tract  . GERD (gastroesophageal reflux disease)  . Knee pain  . Abnormality of gait  . Muscle weakness (generalized)    PT - End of Session Activity Tolerance: Patient limited by pain PT Plan of Care PT Home Exercise Plan: see scanned report PT Patient Instructions: continue with HEP, do not complete exercises that give increased pain.  Consulted and Agree with Plan of Care: Patient  Chrystle Murillo 01/07/2012, 11:54 AM  Physician Documentation Your signature is required to indicate approval of the treatment plan as stated above.  Please sign and either send electronically or make a copy of this report for your files and return this physician signed original.   Please mark one 1.__approve of plan  2. ___approve of plan with the following conditions.   ______________________________                                                          _____________________ Physician Signature                                                                                                             Date

## 2012-01-10 ENCOUNTER — Telehealth (HOSPITAL_COMMUNITY): Payer: Self-pay

## 2012-01-10 ENCOUNTER — Ambulatory Visit (HOSPITAL_COMMUNITY): Payer: BC Managed Care – PPO

## 2012-01-14 ENCOUNTER — Telehealth (HOSPITAL_COMMUNITY): Payer: Self-pay

## 2012-01-14 ENCOUNTER — Ambulatory Visit (HOSPITAL_COMMUNITY): Payer: BC Managed Care – PPO | Admitting: Physical Therapy

## 2012-01-16 ENCOUNTER — Ambulatory Visit (HOSPITAL_COMMUNITY): Payer: BC Managed Care – PPO | Admitting: *Deleted

## 2012-01-21 ENCOUNTER — Ambulatory Visit (HOSPITAL_COMMUNITY): Payer: BC Managed Care – PPO | Admitting: Physical Therapy

## 2012-01-23 ENCOUNTER — Ambulatory Visit (HOSPITAL_COMMUNITY): Payer: BC Managed Care – PPO | Admitting: Physical Therapy

## 2012-01-28 ENCOUNTER — Ambulatory Visit (HOSPITAL_COMMUNITY): Payer: BC Managed Care – PPO | Admitting: Physical Therapy

## 2012-01-31 ENCOUNTER — Ambulatory Visit (HOSPITAL_COMMUNITY): Payer: BC Managed Care – PPO | Admitting: Physical Therapy

## 2012-02-01 ENCOUNTER — Ambulatory Visit (HOSPITAL_COMMUNITY): Payer: BC Managed Care – PPO | Admitting: Physical Therapy

## 2012-05-30 ENCOUNTER — Inpatient Hospital Stay (HOSPITAL_COMMUNITY): Admission: RE | Admit: 2012-05-30 | Payer: BC Managed Care – PPO | Source: Ambulatory Visit

## 2012-06-11 ENCOUNTER — Encounter (HOSPITAL_COMMUNITY): Payer: Self-pay | Admitting: Pharmacy Technician

## 2012-06-12 ENCOUNTER — Other Ambulatory Visit: Payer: Self-pay | Admitting: Physician Assistant

## 2012-06-13 ENCOUNTER — Encounter (HOSPITAL_COMMUNITY)
Admission: RE | Admit: 2012-06-13 | Discharge: 2012-06-13 | Disposition: A | Discharge status: Home / Self Care | Payer: BC Managed Care – PPO | Source: Ambulatory Visit | Attending: Orthopedic Surgery | Admitting: Orthopedic Surgery

## 2012-06-13 ENCOUNTER — Encounter (HOSPITAL_COMMUNITY): Payer: Self-pay

## 2012-06-13 HISTORY — DX: Shortness of breath: R06.02

## 2012-06-13 HISTORY — DX: Personal history of other diseases of the digestive system: Z87.19

## 2012-06-13 HISTORY — DX: Unspecified osteoarthritis, unspecified site: M19.90

## 2012-06-13 HISTORY — DX: Cerebral infarction, unspecified: I63.9

## 2012-06-13 LAB — COMPREHENSIVE METABOLIC PANEL
BUN: 16 mg/dL (ref 6–23)
CO2: 27 mEq/L (ref 19–32)
Calcium: 9.9 mg/dL (ref 8.4–10.5)
Chloride: 97 mEq/L (ref 96–112)
Creatinine, Ser: 0.99 mg/dL (ref 0.50–1.10)
GFR calc Af Amer: 70 mL/min — ABNORMAL LOW (ref >90)
GFR calc non Af Amer: 61 mL/min — ABNORMAL LOW (ref >90)
Glucose, Bld: 107 mg/dL — ABNORMAL HIGH (ref 70–99)
Potassium: 4.3 mEq/L (ref 3.5–5.1)
Sodium: 137 mEq/L (ref 135–145)
Total Protein: 7.5 g/dL (ref 6.0–8.3)

## 2012-06-13 LAB — URINALYSIS, ROUTINE W REFLEX MICROSCOPIC
Bilirubin Urine: NEGATIVE
Glucose, UA: NEGATIVE mg/dL
Hgb urine dipstick: NEGATIVE
Ketones, ur: NEGATIVE mg/dL
Protein, ur: NEGATIVE mg/dL
Urobilinogen, UA: 0.2 mg/dL (ref 0.0–1.0)

## 2012-06-13 LAB — CBC WITH DIFFERENTIAL/PLATELET
Basophils Absolute: 0 10*3/uL (ref 0.0–0.1)
Basophils Relative: 0 % (ref 0–1)
Hemoglobin: 14 g/dL (ref 12.0–15.0)
MCHC: 33.3 g/dL (ref 30.0–36.0)
Monocytes Relative: 13 % — ABNORMAL HIGH (ref 3–12)
Neutro Abs: 3.3 10*3/uL (ref 1.7–7.7)
Neutrophils Relative %: 41 % — ABNORMAL LOW (ref 43–77)
RDW: 14.2 % (ref 11.5–15.5)

## 2012-06-13 LAB — URINE MICROSCOPIC-ADD ON

## 2012-06-13 LAB — PROTIME-INR
INR: 0.95 (ref 0.00–1.49)
Prothrombin Time: 12.6 seconds (ref 11.6–15.2)

## 2012-06-13 LAB — SURGICAL PCR SCREEN: Staphylococcus aureus: NEGATIVE

## 2012-06-13 LAB — TYPE AND SCREEN
ABO/RH(D): B POS
Antibody Screen: NEGATIVE

## 2012-06-13 LAB — ABO/RH: ABO/RH(D): B POS

## 2012-06-13 LAB — APTT: aPTT: 29 seconds (ref 24–37)

## 2012-06-13 NOTE — Progress Notes (Signed)
Cardiac testing,clearance,EKG, CXR requested from Dr.Weintraub.

## 2012-06-13 NOTE — Pre-Procedure Instructions (Signed)
20 Selena [name]  06/13/2012   Your procedure is scheduled on:  06-20-2012  Report to [facility] at 5:30 AM.   TAKE EAST ELEVATORS TO 3RD FLOOR  Call this number if you have problems the morning of surgery: [phone]   Remember:   Do not eat food or drink:After Midnight.      Take these medicines the morning of surgery with A SIP OF WATER: flexeril if needed,gabapentin,levothyroxine,              Methadone,metoprolol,protonix   Do not wear jewelry, make-up or nail polish.  Do not wear lotions, powders, or perfumes.               Do not shave 48 hrs prior to surgery.  Do not bring valuables to the hospital.  Contacts, dentures or bridgework may not be worn into surgery.  Leave suitcase in the car. After surgery it may be brought to your room.   For patients admitted to the hospital, checkout time is 11:00 AM the day of discharge.  Marland Kitchen   Special Instructions: Shower using CHG 2 nights before surgery and the night before surgery.  If you shower the day of surgery use CHG.  Use special wash - you have one bottle of CHG for all showers.  You should use approximately 1/3 of the bottle for each shower.   Please read over the following fact sheets that you were given: Pain Booklet, Coughing and Deep Breathing, Blood Transfusion Information, MRSA Information and Surgical Site Infection Prevention

## 2012-06-14 LAB — URINE CULTURE: Colony Count: >=100000

## 2012-06-16 NOTE — Progress Notes (Signed)
Spoke with Dirk Dress at Dr. Kandis Cocking office. Requested cardiac records be faxed to [phone].

## 2012-06-19 MED ORDER — CLINDAMYCIN PHOSPHATE 900 MG/50ML IV SOLN
900.0000 mg | INTRAVENOUS | Status: AC
  Administered 2012-06-20: 900 mg via INTRAVENOUS
  Filled 2012-06-19 (×2): qty 50

## 2012-06-19 NOTE — H&P (Signed)
[facility] [address]   [address] [place], [place] [address] [phone] A Division of [facility]  Loreta Ave, M.D.   Robert A. Thurston Hole, M.D.   Burnell Blanks, M.D.   Eulas Post, M.D.   Lunette Stands, M.D Buford Dresser, M.D.  Charlsie Quest, M.D.   Estell Harpin, M.D.   Melina Fiddler, M.D. Genene Churn. Barry Dienes, PA-C            Kirstin A. Shepperson, PA-C Josh Chillum, PA-C [place], [place]  RE: [place], [place]   [id number]      DOB: 07-25-1951 PROGRESS NOTE: 06-10-12 Follow-up right knee osteoarthritis.   HPI: The patient is a 61 year old female with a history of right knee osteoarthritis. Here today to discuss continued pain. She has failed multiple conservative treatments with pain medications, corticosteroid injection, visco-supplementation. Does not tolerate anti-inflammatories due to stomach problems. Physical therapy. On knee arthroscopy she was found to have Grade 3-4 changes in the patellofemoral and medial compartments. The knee pain is significantly affecting her quality of life and activities of daily living. It will awaken her from sleep. Review of Systems:   A 10-point review of systems obtained and positive for change in vision, problems swallowing, shortness of breath, chest pain, heart attack, heart disease, stroke, high blood pressure, heart murmur, palpitations, ulcers, nausea and vomiting, blood in the stools, constipation, hemorrhoids, thyroid problems, easy bruising, cancer, ankle swelling, bladder infection, burning urination, frequent urinating, night urinating, headaches, migraines, blackout, and weight gain.  Past Medical History:   Osteoarthritis right knee, hypertension, hypothyroidism, high cholesterol, chronic pain.  Past Surgical History:   Neurosurgery right arm, tonsillectomy, partial hysterectomy with cancer in 1984, gallbladder, hospitalization in 1973 for childbirth.    Current Medications:   Lisinopril 10 mg daily, pantoprazole 40 mg daily, levothyroxine 125 mcg once daily, metoprolol 25 mg b.i.d., furosemide 40 mg daily, Chlorcon 10 mEq b.i.d., simvastatin 40 mg daily, methadone 10 mg b.i.d., cyclobenzaprine 10 mg p.r.n., and nitroglycerin p.r.n. Also takes a baby aspirin daily. Lists drug allergies to Nubain solution, penicillin causes itching, and contrast dye severe allergy. Also a latex allergy mentioned.  Family History:   Positive for heart disease, heart attack, high blood pressure, and seizures in a brother; heart disease, heart attack, high blood pressure, and seizures in a sister; diabetes in her grandparents; high blood pressure in a child.  Social History:   The patient is a 2 pack per day smoker and has been for 16 years. Stopped in 2006. Does not drink alcohol. No history of drug or alcohol abuse. She is married and lives with her husband in a one story residence. She is unemployed.   EXAMINATION: The patient is seated in the exam room in no acute distress. Alert and oriented. Appears appropriate age. Height 5'7", weight 220 pounds. BMI calculated at 34.5. Vital signs:  temperature 98.0, pulse 82, respiration 18, blood pressure 124/84. HEENT:  pupils are equal, round and reactive to light. No dentures or oral implants. Neck is supple with good range of motion. Chest:  normal breath sounds. Lungs clear to auscultation bilaterally. Heart:  regular rate and rhythm. No sign of murmur. Abdomen:  soft, nontender, active bowel sounds in all 4 quadrants. Rectal/breasts:  not indicated for surgery. Neuro:  cranial nerves grossly intact. Musculoskeletal:  examination of right lower extremity shows she is neurovascularly intact. Medial and lateral joint line tenderness. Positive patellofemoral crepitus. Stable ligamentous testing. Palpable Baker's  cyst. Somewhat limited range of motion secondary to pain. She ambulates with an antalgic gait favoring the right lower  extremity. Dorsiflexion and plantar flexion of the ankle intact. Denies any calf tenderness with palpation.   IMAGING: No new images obtained today. X-rays of the right knee taken on 04-22-12 show moderate to severe narrowing of the medial compartment. Weightbearing flexion view shows bone on bone medial compartment.   ASSESSMENT:   1.  Right knee osteoarthritis, severe.  2.  Hypertension. 3.  Hypothyroidism. 4.  High cholesterol. 5.  Chronic low back pain, in pain management.   PLAN: Risks and benefits of right total knee arthroplasty were discussed again today and patient wishes to proceed. She had a preadmission appointment scheduled on 06-13-12. Surgery is scheduled for  06-20-12. She does wish to go home following the surgery with home health PT. She was required to obtain preoperative clearance and has done so with primary care physician, Dr. Carylon Perches, and cardiologist from the [facility]. She was given preoperative instructions. Also discussed postoperative DVT prophylaxis with Lovenox and perioperative antibiotics with clindamycin. She sees Dr. Percell Boston at [facility] for her pain management. Is aware of the upcoming surgery.   Leotha Voeltz A. Vernee Baines, PA-C

## 2012-06-20 ENCOUNTER — Encounter (HOSPITAL_COMMUNITY)
Admission: RE | Disposition: A | Discharge status: Home Health | Payer: Self-pay | Source: Ambulatory Visit | Attending: Orthopedic Surgery

## 2012-06-20 ENCOUNTER — Encounter (HOSPITAL_COMMUNITY): Payer: Self-pay | Admitting: Anesthesiology

## 2012-06-20 ENCOUNTER — Inpatient Hospital Stay (HOSPITAL_COMMUNITY)
Admission: RE | Admit: 2012-06-20 | Discharge: 2012-06-23 | DRG: 209 | Disposition: A | Discharge status: Home Health | Payer: BC Managed Care – PPO | Source: Ambulatory Visit | Attending: Orthopedic Surgery | Admitting: Orthopedic Surgery

## 2012-06-20 ENCOUNTER — Ambulatory Visit (HOSPITAL_COMMUNITY): Payer: BC Managed Care – PPO

## 2012-06-20 ENCOUNTER — Encounter (HOSPITAL_COMMUNITY): Payer: Self-pay | Admitting: *Deleted

## 2012-06-20 ENCOUNTER — Ambulatory Visit (HOSPITAL_COMMUNITY): Payer: BC Managed Care – PPO | Admitting: Anesthesiology

## 2012-06-20 DIAGNOSIS — G8929 Other chronic pain: Secondary | ICD-10-CM | POA: Diagnosis present

## 2012-06-20 DIAGNOSIS — I2510 Atherosclerotic heart disease of native coronary artery without angina pectoris: Secondary | ICD-10-CM | POA: Diagnosis present

## 2012-06-20 DIAGNOSIS — E7800 Pure hypercholesterolemia, unspecified: Secondary | ICD-10-CM | POA: Diagnosis present

## 2012-06-20 DIAGNOSIS — Z01812 Encounter for preprocedural laboratory examination: Secondary | ICD-10-CM

## 2012-06-20 DIAGNOSIS — M1710 Unilateral primary osteoarthritis, unspecified knee: Secondary | ICD-10-CM

## 2012-06-20 DIAGNOSIS — K279 Peptic ulcer, site unspecified, unspecified as acute or chronic, without hemorrhage or perforation: Secondary | ICD-10-CM | POA: Diagnosis present

## 2012-06-20 DIAGNOSIS — K219 Gastro-esophageal reflux disease without esophagitis: Secondary | ICD-10-CM | POA: Diagnosis present

## 2012-06-20 DIAGNOSIS — I10 Essential (primary) hypertension: Secondary | ICD-10-CM | POA: Diagnosis present

## 2012-06-20 DIAGNOSIS — R52 Pain, unspecified: Secondary | ICD-10-CM | POA: Diagnosis present

## 2012-06-20 DIAGNOSIS — F329 Major depressive disorder, single episode, unspecified: Secondary | ICD-10-CM | POA: Diagnosis present

## 2012-06-20 DIAGNOSIS — E785 Hyperlipidemia, unspecified: Secondary | ICD-10-CM | POA: Diagnosis present

## 2012-06-20 DIAGNOSIS — Z8711 Personal history of peptic ulcer disease: Secondary | ICD-10-CM

## 2012-06-20 DIAGNOSIS — M545 Low back pain, unspecified: Secondary | ICD-10-CM | POA: Diagnosis present

## 2012-06-20 DIAGNOSIS — M179 Osteoarthritis of knee, unspecified: Secondary | ICD-10-CM

## 2012-06-20 DIAGNOSIS — I252 Old myocardial infarction: Secondary | ICD-10-CM

## 2012-06-20 DIAGNOSIS — I872 Venous insufficiency (chronic) (peripheral): Secondary | ICD-10-CM | POA: Diagnosis present

## 2012-06-20 DIAGNOSIS — F32A Depression, unspecified: Secondary | ICD-10-CM | POA: Diagnosis present

## 2012-06-20 DIAGNOSIS — F3289 Other specified depressive episodes: Secondary | ICD-10-CM | POA: Diagnosis present

## 2012-06-20 DIAGNOSIS — E039 Hypothyroidism, unspecified: Secondary | ICD-10-CM | POA: Diagnosis present

## 2012-06-20 HISTORY — PX: TOTAL KNEE ARTHROPLASTY: SHX125

## 2012-06-20 SURGERY — ARTHROPLASTY, KNEE, TOTAL
Anesthesia: General | Site: Knee | Laterality: Right | Wound class: Clean

## 2012-06-20 MED ORDER — BISACODYL 10 MG RE SUPP: 10.0000 mg | Freq: Every day | RECTAL | Status: DC | PRN

## 2012-06-20 MED ORDER — MIDAZOLAM HCL 5 MG/5ML IJ SOLN
INTRAMUSCULAR | Status: DC | PRN
  Administered 2012-06-20: 2 mg via INTRAVENOUS

## 2012-06-20 MED ORDER — OXYCODONE HCL 5 MG PO TABS
5.0000 mg | ORAL_TABLET | ORAL | Status: DC | PRN
  Administered 2012-06-20 – 2012-06-23 (×16): 10 mg via ORAL
  Filled 2012-06-20 (×16): qty 2

## 2012-06-20 MED ORDER — PANTOPRAZOLE SODIUM 40 MG PO TBEC
40.0000 mg | DELAYED_RELEASE_TABLET | Freq: Every day | ORAL | Status: DC
  Administered 2012-06-21 – 2012-06-23 (×3): 40 mg via ORAL
  Filled 2012-06-20 (×3): qty 1

## 2012-06-20 MED ORDER — TEMAZEPAM 15 MG PO CAPS: 15.0000 mg | ORAL_CAPSULE | Freq: Every evening | ORAL | Status: DC | PRN

## 2012-06-20 MED ORDER — HYDROMORPHONE HCL PF 1 MG/ML IJ SOLN
INTRAMUSCULAR | Status: AC
  Filled 2012-06-20: qty 1

## 2012-06-20 MED ORDER — PHENYLEPHRINE HCL 10 MG/ML IJ SOLN
INTRAMUSCULAR | Status: DC | PRN
  Administered 2012-06-20 (×5): 80 ug via INTRAVENOUS
  Administered 2012-06-20: 160 ug via INTRAVENOUS

## 2012-06-20 MED ORDER — GLYCOPYRROLATE 0.2 MG/ML IJ SOLN
INTRAMUSCULAR | Status: DC | PRN
  Administered 2012-06-20: .5 mg via INTRAVENOUS

## 2012-06-20 MED ORDER — MEPERIDINE HCL 25 MG/ML IJ SOLN: 6.2500 mg | INTRAMUSCULAR | Status: DC | PRN

## 2012-06-20 MED ORDER — ONDANSETRON HCL 4 MG/2ML IJ SOLN: 4.0000 mg | Freq: Once | INTRAMUSCULAR | Status: DC | PRN

## 2012-06-20 MED ORDER — ACETAMINOPHEN 650 MG RE SUPP: 650.0000 mg | Freq: Four times a day (QID) | RECTAL | Status: DC | PRN

## 2012-06-20 MED ORDER — METOPROLOL TARTRATE 25 MG PO TABS
25.0000 mg | ORAL_TABLET | Freq: Two times a day (BID) | ORAL | Status: DC
  Administered 2012-06-20 – 2012-06-23 (×6): 25 mg via ORAL
  Filled 2012-06-20 (×7): qty 1

## 2012-06-20 MED ORDER — GABAPENTIN 600 MG PO TABS
600.0000 mg | ORAL_TABLET | Freq: Four times a day (QID) | ORAL | Status: DC
  Administered 2012-06-20 – 2012-06-23 (×11): 600 mg via ORAL
  Filled 2012-06-20 (×14): qty 1

## 2012-06-20 MED ORDER — NEOSTIGMINE METHYLSULFATE 1 MG/ML IJ SOLN
INTRAMUSCULAR | Status: DC | PRN
  Administered 2012-06-20: 3 mg via INTRAVENOUS

## 2012-06-20 MED ORDER — ACETAMINOPHEN 10 MG/ML IV SOLN
1000.0000 mg | Freq: Four times a day (QID) | INTRAVENOUS | Status: AC
  Administered 2012-06-20 – 2012-06-21 (×4): 1000 mg via INTRAVENOUS
  Filled 2012-06-20 (×4): qty 100

## 2012-06-20 MED ORDER — NITROGLYCERIN 0.4 MG SL SUBL: 0.4000 mg | SUBLINGUAL_TABLET | SUBLINGUAL | Status: DC | PRN

## 2012-06-20 MED ORDER — FLEET ENEMA 7-19 GM/118ML RE ENEM: 1.0000 | ENEMA | Freq: Once | RECTAL | Status: AC | PRN

## 2012-06-20 MED ORDER — CHLORHEXIDINE GLUCONATE 4 % EX LIQD: 60.0000 mL | Freq: Once | CUTANEOUS | Status: DC

## 2012-06-20 MED ORDER — DIPHENHYDRAMINE HCL 12.5 MG/5ML PO ELIX: 12.5000 mg | ORAL_SOLUTION | ORAL | Status: DC | PRN

## 2012-06-20 MED ORDER — LISINOPRIL 10 MG PO TABS
10.0000 mg | ORAL_TABLET | Freq: Every day | ORAL | Status: DC
  Administered 2012-06-21 – 2012-06-23 (×3): 10 mg via ORAL
  Filled 2012-06-20 (×3): qty 1

## 2012-06-20 MED ORDER — LACTATED RINGERS IV SOLN
INTRAVENOUS | Status: DC | PRN
  Administered 2012-06-20 (×3): via INTRAVENOUS

## 2012-06-20 MED ORDER — ENOXAPARIN SODIUM 30 MG/0.3ML ~~LOC~~ SOLN: 30.0000 mg | Freq: Two times a day (BID) | SUBCUTANEOUS | Status: DC

## 2012-06-20 MED ORDER — METOCLOPRAMIDE HCL 10 MG PO TABS: 5.0000 mg | ORAL_TABLET | Freq: Three times a day (TID) | ORAL | Status: DC | PRN

## 2012-06-20 MED ORDER — LEVOTHYROXINE SODIUM 150 MCG PO TABS
150.0000 ug | ORAL_TABLET | Freq: Every day | ORAL | Status: DC
  Administered 2012-06-21 – 2012-06-23 (×3): 150 ug via ORAL
  Filled 2012-06-20 (×4): qty 1

## 2012-06-20 MED ORDER — ACETAMINOPHEN 10 MG/ML IV SOLN
1000.0000 mg | Freq: Four times a day (QID) | INTRAVENOUS | Status: DC
  Administered 2012-06-20: 1000 mg via INTRAVENOUS

## 2012-06-20 MED ORDER — OXYCODONE HCL 5 MG PO TABS: ORAL_TABLET | ORAL | Status: DC

## 2012-06-20 MED ORDER — PHENOL 1.4 % MT LIQD: 1.0000 | OROMUCOSAL | Status: DC | PRN

## 2012-06-20 MED ORDER — POTASSIUM CHLORIDE ER 10 MEQ PO TBCR
10.0000 meq | EXTENDED_RELEASE_TABLET | Freq: Every day | ORAL | Status: DC
  Administered 2012-06-21 – 2012-06-23 (×3): 10 meq via ORAL
  Filled 2012-06-20 (×3): qty 1

## 2012-06-20 MED ORDER — HYDROMORPHONE HCL PF 1 MG/ML IJ SOLN
1.0000 mg | INTRAMUSCULAR | Status: DC | PRN
  Administered 2012-06-20 (×3): 1 mg via INTRAVENOUS
  Filled 2012-06-20 (×3): qty 1

## 2012-06-20 MED ORDER — BUPIVACAINE-EPINEPHRINE PF 0.5-1:200000 % IJ SOLN
INTRAMUSCULAR | Status: DC | PRN
  Administered 2012-06-20: 30 mL

## 2012-06-20 MED ORDER — CLINDAMYCIN PHOSPHATE 600 MG/50ML IV SOLN
600.0000 mg | Freq: Four times a day (QID) | INTRAVENOUS | Status: AC
  Administered 2012-06-20 (×2): 600 mg via INTRAVENOUS
  Filled 2012-06-20 (×2): qty 50

## 2012-06-20 MED ORDER — ACETAMINOPHEN 10 MG/ML IV SOLN
INTRAVENOUS | Status: AC
  Filled 2012-06-20: qty 100

## 2012-06-20 MED ORDER — ACETAMINOPHEN 325 MG PO TABS
650.0000 mg | ORAL_TABLET | Freq: Four times a day (QID) | ORAL | Status: DC | PRN
  Administered 2012-06-21 – 2012-06-23 (×2): 650 mg via ORAL
  Filled 2012-06-20 (×3): qty 2

## 2012-06-20 MED ORDER — METOCLOPRAMIDE HCL 5 MG/ML IJ SOLN: 5.0000 mg | Freq: Three times a day (TID) | INTRAMUSCULAR | Status: DC | PRN

## 2012-06-20 MED ORDER — FENTANYL CITRATE 0.05 MG/ML IJ SOLN
INTRAMUSCULAR | Status: DC | PRN
  Administered 2012-06-20: 150 ug via INTRAVENOUS
  Administered 2012-06-20: 500 ug via INTRAVENOUS

## 2012-06-20 MED ORDER — ONDANSETRON HCL 4 MG PO TABS: 4.0000 mg | ORAL_TABLET | Freq: Four times a day (QID) | ORAL | Status: DC | PRN

## 2012-06-20 MED ORDER — SODIUM CHLORIDE 0.9 % IR SOLN
Status: DC | PRN
  Administered 2012-06-20: 3000 mL
  Administered 2012-06-20: 1000 mL

## 2012-06-20 MED ORDER — SODIUM CHLORIDE 0.9 % IV SOLN
INTRAVENOUS | Status: DC
  Administered 2012-06-20 – 2012-06-21 (×2): via INTRAVENOUS

## 2012-06-20 MED ORDER — METHADONE HCL 5 MG PO TABS
10.0000 mg | ORAL_TABLET | Freq: Two times a day (BID) | ORAL | Status: DC
  Administered 2012-06-20 – 2012-06-23 (×6): 10 mg via ORAL
  Filled 2012-06-20 (×6): qty 2

## 2012-06-20 MED ORDER — PROPOFOL 10 MG/ML IV BOLUS
INTRAVENOUS | Status: DC | PRN
  Administered 2012-06-20: 150 mg via INTRAVENOUS

## 2012-06-20 MED ORDER — ROCURONIUM BROMIDE 100 MG/10ML IV SOLN
INTRAVENOUS | Status: DC | PRN
  Administered 2012-06-20: 50 mg via INTRAVENOUS

## 2012-06-20 MED ORDER — SENNOSIDES-DOCUSATE SODIUM 8.6-50 MG PO TABS
1.0000 | ORAL_TABLET | Freq: Every evening | ORAL | Status: DC | PRN
  Administered 2012-06-22: 1 via ORAL
  Filled 2012-06-20: qty 1

## 2012-06-20 MED ORDER — MENTHOL 3 MG MT LOZG: 1.0000 | LOZENGE | OROMUCOSAL | Status: DC | PRN

## 2012-06-20 MED ORDER — FUROSEMIDE 40 MG PO TABS
40.0000 mg | ORAL_TABLET | Freq: Every day | ORAL | Status: DC
  Administered 2012-06-21 – 2012-06-23 (×3): 40 mg via ORAL
  Filled 2012-06-20 (×3): qty 1

## 2012-06-20 MED ORDER — ONDANSETRON HCL 4 MG/2ML IJ SOLN: 4.0000 mg | Freq: Four times a day (QID) | INTRAMUSCULAR | Status: DC | PRN

## 2012-06-20 MED ORDER — HYDROMORPHONE HCL PF 1 MG/ML IJ SOLN
0.2500 mg | INTRAMUSCULAR | Status: DC | PRN
  Administered 2012-06-20 (×4): 0.5 mg via INTRAVENOUS

## 2012-06-20 MED ORDER — OXYCODONE HCL 5 MG/5ML PO SOLN: 5.0000 mg | Freq: Once | ORAL | Status: DC | PRN

## 2012-06-20 MED ORDER — LIDOCAINE HCL (CARDIAC) 20 MG/ML IV SOLN
INTRAVENOUS | Status: DC | PRN
  Administered 2012-06-20: 80 mg via INTRAVENOUS

## 2012-06-20 MED ORDER — CYCLOBENZAPRINE HCL 10 MG PO TABS
10.0000 mg | ORAL_TABLET | Freq: Every day | ORAL | Status: DC | PRN
  Administered 2012-06-20 – 2012-06-23 (×3): 10 mg via ORAL
  Filled 2012-06-20 (×3): qty 1

## 2012-06-20 MED ORDER — ENOXAPARIN SODIUM 30 MG/0.3ML ~~LOC~~ SOLN
30.0000 mg | Freq: Two times a day (BID) | SUBCUTANEOUS | Status: DC
  Administered 2012-06-21 – 2012-06-23 (×5): 30 mg via SUBCUTANEOUS
  Filled 2012-06-20 (×7): qty 0.3

## 2012-06-20 MED ORDER — ONDANSETRON HCL 4 MG/2ML IJ SOLN
INTRAMUSCULAR | Status: DC | PRN
  Administered 2012-06-20: 4 mg via INTRAVENOUS

## 2012-06-20 MED ORDER — SIMVASTATIN 40 MG PO TABS
40.0000 mg | ORAL_TABLET | Freq: Every day | ORAL | Status: DC
  Administered 2012-06-20 – 2012-06-22 (×3): 40 mg via ORAL
  Filled 2012-06-20 (×4): qty 1

## 2012-06-20 MED ORDER — OXYCODONE HCL 5 MG PO TABS: 5.0000 mg | ORAL_TABLET | Freq: Once | ORAL | Status: DC | PRN

## 2012-06-20 MED ORDER — DOCUSATE SODIUM 100 MG PO CAPS
100.0000 mg | ORAL_CAPSULE | Freq: Two times a day (BID) | ORAL | Status: DC
  Administered 2012-06-20 – 2012-06-23 (×6): 100 mg via ORAL
  Filled 2012-06-20 (×7): qty 1

## 2012-06-20 SURGICAL SUPPLY — 65 items
BANDAGE ELASTIC 4 VELCRO ST LF (GAUZE/BANDAGES/DRESSINGS) ×2 IMPLANT
BANDAGE ELASTIC 6 VELCRO ST LF (GAUZE/BANDAGES/DRESSINGS) ×2 IMPLANT
BANDAGE ESMARK 6X9 LF (GAUZE/BANDAGES/DRESSINGS) ×1 IMPLANT
BLADE SAGITTAL 25.0X1.19X90 (BLADE) ×2 IMPLANT
BLADE SAW SAG 90X13X1.27 (BLADE) ×2 IMPLANT
BNDG CMPR 9X6 STRL LF SNTH (GAUZE/BANDAGES/DRESSINGS) ×1
BNDG ESMARK 6X9 LF (GAUZE/BANDAGES/DRESSINGS) ×2
BONE CEMENT GENTAMICIN (Cement) ×4 IMPLANT
BOWL SMART MIX CTS (DISPOSABLE) ×2 IMPLANT
CEMENT BONE GENTAMICIN 40 (Cement) IMPLANT
CLOTH BEACON ORANGE TIMEOUT ST (SAFETY) ×2 IMPLANT
COVER BACK TABLE 24X17X13 BIG (DRAPES) IMPLANT
COVER SURGICAL LIGHT HANDLE (MISCELLANEOUS) ×2 IMPLANT
CUFF TOURNIQUET SINGLE 34IN LL (TOURNIQUET CUFF) ×2 IMPLANT
CUFF TOURNIQUET SINGLE 44IN (TOURNIQUET CUFF) IMPLANT
DRAPE INCISE IOBAN 66X45 STRL (DRAPES) ×1 IMPLANT
DRAPE ORTHO SPLIT 77X108 STRL (DRAPES) ×4
DRAPE SURG ORHT 6 SPLT 77X108 (DRAPES) ×2 IMPLANT
DRAPE U-SHAPE 47X51 STRL (DRAPES) ×2 IMPLANT
DRSG ADAPTIC 3X8 NADH LF (GAUZE/BANDAGES/DRESSINGS) ×2 IMPLANT
DRSG PAD ABDOMINAL 8X10 ST (GAUZE/BANDAGES/DRESSINGS) ×4 IMPLANT
DURAPREP 26ML APPLICATOR (WOUND CARE) ×2 IMPLANT
ELECT REM PT RETURN 9FT ADLT (ELECTROSURGICAL) ×2
ELECTRODE REM PT RTRN 9FT ADLT (ELECTROSURGICAL) ×1 IMPLANT
EVACUATOR 1/8 PVC DRAIN (DRAIN) ×2 IMPLANT
FACESHIELD LNG OPTICON STERILE (SAFETY) ×4 IMPLANT
FLOSEAL 10ML (HEMOSTASIS) IMPLANT
GLOVE BIOGEL PI IND STRL 7.5 (GLOVE) IMPLANT
GLOVE BIOGEL PI IND STRL 8 (GLOVE) ×4 IMPLANT
GLOVE BIOGEL PI INDICATOR 7.5 (GLOVE) ×1
GLOVE BIOGEL PI INDICATOR 8 (GLOVE) ×2
GLOVE ORTHO TXT STRL SZ7.5 (GLOVE) ×4 IMPLANT
GLOVE SURG ORTHO 8.0 STRL STRW (GLOVE) ×5 IMPLANT
GLOVE SURG SS PI 7.5 STRL IVOR (GLOVE) ×1 IMPLANT
GOWN PREVENTION PLUS XLARGE (GOWN DISPOSABLE) ×3 IMPLANT
GOWN PREVENTION PLUS XXLARGE (GOWN DISPOSABLE) ×2 IMPLANT
GOWN STRL NON-REIN LRG LVL3 (GOWN DISPOSABLE) ×2 IMPLANT
GOWN STRL REIN XL XLG (GOWN DISPOSABLE) ×1 IMPLANT
HANDPIECE INTERPULSE COAX TIP (DISPOSABLE) ×2
HOOD PEEL AWAY FACE SHEILD DIS (HOOD) ×2 IMPLANT
IMMOBILIZER KNEE 22 UNIV (SOFTGOODS) ×1 IMPLANT
KIT BASIN OR (CUSTOM PROCEDURE TRAY) ×2 IMPLANT
KIT ROOM TURNOVER OR (KITS) ×2 IMPLANT
MANIFOLD NEPTUNE II (INSTRUMENTS) ×2 IMPLANT
NEEDLE 22X1 1/2 (OR ONLY) (NEEDLE) IMPLANT
NS IRRIG 1000ML POUR BTL (IV SOLUTION) ×2 IMPLANT
PACK TOTAL JOINT (CUSTOM PROCEDURE TRAY) ×2 IMPLANT
PAD ARMBOARD 7.5X6 YLW CONV (MISCELLANEOUS) ×3 IMPLANT
PAD CAST 4YDX4 CTTN HI CHSV (CAST SUPPLIES) ×1 IMPLANT
PADDING CAST COTTON 4X4 STRL (CAST SUPPLIES) ×2
PADDING CAST COTTON 6X4 STRL (CAST SUPPLIES) ×2 IMPLANT
SET HNDPC FAN SPRY TIP SCT (DISPOSABLE) ×1 IMPLANT
SPONGE GAUZE 4X4 12PLY (GAUZE/BANDAGES/DRESSINGS) ×2 IMPLANT
STAPLER VISISTAT 35W (STAPLE) ×2 IMPLANT
SUCTION FRAZIER TIP 10 FR DISP (SUCTIONS) ×2 IMPLANT
SUT ETHIBOND NAB CT1 #1 30IN (SUTURE) ×6 IMPLANT
SUT VIC AB 0 CT1 27 (SUTURE) ×2
SUT VIC AB 0 CT1 27XBRD ANBCTR (SUTURE) ×1 IMPLANT
SUT VIC AB 2-0 CT1 27 (SUTURE) ×4
SUT VIC AB 2-0 CT1 TAPERPNT 27 (SUTURE) ×2 IMPLANT
SYR CONTROL 10ML LL (SYRINGE) IMPLANT
TOWEL OR 17X24 6PK STRL BLUE (TOWEL DISPOSABLE) ×2 IMPLANT
TOWEL OR 17X26 10 PK STRL BLUE (TOWEL DISPOSABLE) ×2 IMPLANT
TRAY FOLEY CATH 14FR (SET/KITS/TRAYS/PACK) ×2 IMPLANT
WATER STERILE IRR 1000ML POUR (IV SOLUTION) ×5 IMPLANT

## 2012-06-20 NOTE — Preoperative (Signed)
Beta Blockers   Reason not to administer Beta Blockers:Not Applicable 

## 2012-06-20 NOTE — Interval H&P Note (Signed)
History and Physical Interval Note:  06/20/2012 7:38 AM  Selena [name]  has presented today for surgery, with the diagnosis of OA OF RIGHT KNEE, DEGENERATIVE ARTHRITIS - LEG/KNEE 715.36  The various methods of treatment have been discussed with the patient and family. After consideration of risks, benefits and other options for treatment, the patient has consented to  Procedure(s) (LRB) with comments: TOTAL KNEE ARTHROPLASTY (Right) - RIGHT ARTHROPLASTY KNEE MEDIAL AND LATERAL COMPARTMENTS WITH OR WITHOUT PATELLA RESURFACING as a surgical intervention .  The patient's history has been reviewed, patient examined, no change in status, stable for surgery.  I have reviewed the patient's chart and labs.  Questions were answered to the patient's satisfaction.     Caidan Hubbert JR,W D

## 2012-06-20 NOTE — Brief Op Note (Signed)
06/20/2012  11:37 PM  PATIENT:  Selena [name]  61 y.o. female  PRE-OPERATIVE DIAGNOSIS:  OA OF RIGHT KNEE, DEGENERATIVE ARTHRITIS - LEG/KNEE   POST-OPERATIVE DIAGNOSIS:  OA OF RIGHT KNEE, DEGENERATIVE ARTHRITIS - LEG/KNEE   PROCEDURE:  Procedure(s) (LRB) with comments: TOTAL KNEE ARTHROPLASTY (Right)  SURGEON:  Surgeon(s) and Role:    * W D Carloyn Manner., MD - Primary  PHYSICIAN ASSISTANT:   ASSISTANTS: Margart Sickles, PA-C  ANESTHESIA:   gen/block  EBL:     BLOOD ADMINISTERED:none  DRAINS: hemovac right knee self suction  LOCAL MEDICATIONS USED:  none  SPECIMEN:  none  DISPOSITION OF SPECIMEN:  n/a  COUNTS:  yes  TOURNIQUET:   Total Tourniquet Time Documented: Thigh (Right) - 69 minutes  DICTATION: .  PLAN OF CARE: admit to inpatient  PATIENT DISPOSITION:  pacu hemodynamically stable   Delay start of Pharmacological VTE agent (>24hrs) due to surgical blood loss or risk of bleeding: {YES

## 2012-06-20 NOTE — Transfer of Care (Signed)
Immediate Anesthesia Transfer of Care Note  Patient: Selena [name]  Procedure(s) Performed: Procedure(s) (LRB) with comments: TOTAL KNEE ARTHROPLASTY (Right)  Patient Location: PACU  Anesthesia Type:General  Level of Consciousness: awake, alert  and oriented  Airway & Oxygen Therapy: Patient Spontanous Breathing and Patient connected to nasal cannula oxygen  Post-op Assessment: Report given to PACU RN and Post -op Vital signs reviewed and stable  Post vital signs: Reviewed and stable  Complications: No apparent anesthesia complications

## 2012-06-20 NOTE — Anesthesia Procedure Notes (Addendum)
Anesthesia Regional Block:  Femoral nerve block  Pre-Anesthetic Checklist: ,, timeout performed, Correct Patient, Correct Site, Correct Laterality, Correct Procedure, Correct Position, site marked, Risks and benefits discussed,  Surgical consent,  Pre-op evaluation,  At surgeon's request and post-op pain management  Laterality: Right  Prep: chloraprep       Needles:  Injection technique: Single-shot  Needle Type: Echogenic Stimulator Needle          Additional Needles:  Procedures: ultrasound guided (picture in chart) and nerve stimulator Femoral nerve block  Nerve Stimulator or Paresthesia:  Response: 0.4 mA,   Additional Responses:   Narrative:  Start time: 06/20/2012 7:15 AM End time: 06/20/2012 7:25 AM Injection made incrementally with aspirations every 5 mL.  Performed by: Personally  Anesthesiologist: Arta Bruce MD  Additional Notes: Monitors applied. Patient sedated. Sterile prep and drape,hand hygiene and sterile gloves were used. Relevant anatomy identified.Needle position confirmed.Local anesthetic injected incrementally after negative aspiration. Local anesthetic spread visualized around nerve(s). Vascular puncture avoided. No complications. Image printed for medical record.The patient tolerated the procedure well.       Femoral nerve block Procedure Name: Intubation Date/Time: 06/20/2012 7:52 AM Performed by: Marena Chancy Pre-anesthesia Checklist: Timeout performed, Patient identified, Emergency Drugs available, Suction available and Patient being monitored Patient Re-evaluated:Patient Re-evaluated prior to inductionOxygen Delivery Method: Circle system utilized Preoxygenation: Pre-oxygenation with 100% oxygen Intubation Type: IV induction Ventilation: Mask ventilation without difficulty Laryngoscope Size: Miller and 2 Grade View: Grade II Tube type: Oral Tube size: 7.5 mm Number of attempts: 1 Placement Confirmation: ETT inserted through  vocal cords under direct vision,  breath sounds checked- equal and bilateral and positive ETCO2 Secured at: 21 cm Tube secured with: Tape Dental Injury: Teeth and Oropharynx as per pre-operative assessment

## 2012-06-20 NOTE — Progress Notes (Signed)
Orthopedic Tech Progress Note Patient Details:  Selena [name] October 19, 1950 [id number] CPM applied with appropriate settings; OHF applied to bed CPM Right Knee CPM Right Knee: On Right Knee Flexion (Degrees): 60  Right Knee Extension (Degrees): 0    Asia R Thompson 06/20/2012, 11:51 AM

## 2012-06-20 NOTE — Anesthesia Postprocedure Evaluation (Signed)
Anesthesia Post Note  Patient: Selena [name]  Procedure(s) Performed: Procedure(s) (LRB): TOTAL KNEE ARTHROPLASTY (Right)  Anesthesia type: general  Patient location: PACU  Post pain: Pain level controlled  Post assessment: Patient's Cardiovascular Status Stable  Last Vitals:  Filed Vitals:   06/20/12 1130  BP: 129/72  Pulse: 77  Temp:   Resp: 14    Post vital signs: Reviewed and stable  Level of consciousness: sedated  Complications: No apparent anesthesia complications

## 2012-06-20 NOTE — Anesthesia Preprocedure Evaluation (Addendum)
Anesthesia Evaluation  Patient identified by MRN, date of birth, ID band Patient awake    Reviewed: Allergy & Precautions, H&P , NPO status , Patient's Chart, lab work & pertinent test results, reviewed documented beta blocker date and time   Airway Mallampati: II TM Distance: >3 FB Neck ROM: Full    Dental  (+) Teeth Intact, Partial Lower and Partial Upper   Pulmonary shortness of breath and with exertion,          Cardiovascular hypertension, Pt. on home beta blockers + CAD and + Past MI     Neuro/Psych PSYCHIATRIC DISORDERS Depression TIA   GI/Hepatic hiatal hernia, PUD, GERD-  Medicated,  Endo/Other  Hypothyroidism   Renal/GU      Musculoskeletal   Abdominal   Peds  Hematology   Anesthesia Other Findings   Reproductive/Obstetrics                          Anesthesia Physical Anesthesia Plan  ASA: III  Anesthesia Plan: General   Post-op Pain Management:    Induction: Intravenous  Airway Management Planned: Oral ETT  Additional Equipment:   Intra-op Plan:   Post-operative Plan: Extubation in OR  Informed Consent: I have reviewed the patients History and Physical, chart, labs and discussed the procedure including the risks, benefits and alternatives for the proposed anesthesia with the patient or authorized representative who has indicated his/her understanding and acceptance.     Plan Discussed with: CRNA and Surgeon  Anesthesia Plan Comments:        Anesthesia Quick Evaluation

## 2012-06-20 NOTE — H&P (View-Only) (Signed)
[facility] [address]   [address] [place], [place] [address] [phone] A Division of [facility]  Daniel F. Murphy, M.D.   Robert A. Wainer, M.D.   W. Dan Caffrey, M.D.   Amery Minasyan P. Landau, M.D.   Anna Voytek, M.D Alasco R. Ibazebo, M.D.  S. Michael Tooke, M.D.   James S. Kramer, M.D.   Rebecca S. Bassett, M.D. James M. Owens, PA-C            Kirstin A. Shepperson, PA-C Josh Ahamed Hofland, PA-C Brandon Parry, OPA-C  RE: [name], Selena   [id number]      DOB: 05/24/1951 PROGRESS NOTE: 06-10-12 Follow-up right knee osteoarthritis.   HPI: The patient is a 61-year-old female with a history of right knee osteoarthritis. Here today to discuss continued pain. She has failed multiple conservative treatments with pain medications, corticosteroid injection, visco-supplementation. Does not tolerate anti-inflammatories due to stomach problems. Physical therapy. On knee arthroscopy she was found to have Grade 3-4 changes in the patellofemoral and medial compartments. The knee pain is significantly affecting her quality of life and activities of daily living. It will awaken her from sleep. Review of Systems:   A 10-point review of systems obtained and positive for change in vision, problems swallowing, shortness of breath, chest pain, heart attack, heart disease, stroke, high blood pressure, heart murmur, palpitations, ulcers, nausea and vomiting, blood in the stools, constipation, hemorrhoids, thyroid problems, easy bruising, cancer, ankle swelling, bladder infection, burning urination, frequent urinating, night urinating, headaches, migraines, blackout, and weight gain.  Past Medical History:   Osteoarthritis right knee, hypertension, hypothyroidism, high cholesterol, chronic pain.  Past Surgical History:   Neurosurgery right arm, tonsillectomy, partial hysterectomy with cancer in 1984, gallbladder, hospitalization in 1973 for childbirth.    Current Medications:   Lisinopril 10 mg daily, pantoprazole 40 mg daily, levothyroxine 125 mcg once daily, metoprolol 25 mg b.i.d., furosemide 40 mg daily, Chlorcon 10 mEq b.i.d., simvastatin 40 mg daily, methadone 10 mg b.i.d., cyclobenzaprine 10 mg p.r.n., and nitroglycerin p.r.n. Also takes a baby aspirin daily. Lists drug allergies to Nubain solution, penicillin causes itching, and contrast dye severe allergy. Also a latex allergy mentioned.  Family History:   Positive for heart disease, heart attack, high blood pressure, and seizures in a brother; heart disease, heart attack, high blood pressure, and seizures in a sister; diabetes in her grandparents; high blood pressure in a child.  Social History:   The patient is a 2 pack per day smoker and has been for 16 years. Stopped in 2006. Does not drink alcohol. No history of drug or alcohol abuse. She is married and lives with her husband in a one story residence. She is unemployed.   EXAMINATION: The patient is seated in the exam room in no acute distress. Alert and oriented. Appears appropriate age. Height 5'7", weight 220 pounds. BMI calculated at 34.5. Vital signs:  temperature 98.0, pulse 82, respiration 18, blood pressure 124/84. HEENT:  pupils are equal, round and reactive to light. No dentures or oral implants. Neck is supple with good range of motion. Chest:  normal breath sounds. Lungs clear to auscultation bilaterally. Heart:  regular rate and rhythm. No sign of murmur. Abdomen:  soft, nontender, active bowel sounds in all 4 quadrants. Rectal/breasts:  not indicated for surgery. Neuro:  cranial nerves grossly intact. Musculoskeletal:  examination of right lower extremity shows she is neurovascularly intact. Medial and lateral joint line tenderness. Positive patellofemoral crepitus. Stable ligamentous testing. Palpable Baker's   cyst. Somewhat limited range of motion secondary to pain. She ambulates with an antalgic gait favoring the right lower  extremity. Dorsiflexion and plantar flexion of the ankle intact. Denies any calf tenderness with palpation.   IMAGING: No new images obtained today. X-rays of the right knee taken on 04-22-12 show moderate to severe narrowing of the medial compartment. Weightbearing flexion view shows bone on bone medial compartment.   ASSESSMENT:   1.  Right knee osteoarthritis, severe.  2.  Hypertension. 3.  Hypothyroidism. 4.  High cholesterol. 5.  Chronic low back pain, in pain management.   PLAN: Risks and benefits of right total knee arthroplasty were discussed again today and patient wishes to proceed. She had a preadmission appointment scheduled on 06-13-12. Surgery is scheduled for  06-20-12. She does wish to go home following the surgery with home health PT. She was required to obtain preoperative clearance and has done so with primary care physician, Dr. Roy Fagan, and cardiologist from the [facility]. She was given preoperative instructions. Also discussed postoperative DVT prophylaxis with Lovenox and perioperative antibiotics with clindamycin. She sees Dr. Tananis at [facility] for her pain management. Is aware of the upcoming surgery.   Selena Girgenti A. Kayleeann Huxford, PA-C 

## 2012-06-21 DIAGNOSIS — F32A Depression, unspecified: Secondary | ICD-10-CM | POA: Diagnosis present

## 2012-06-21 DIAGNOSIS — K279 Peptic ulcer, site unspecified, unspecified as acute or chronic, without hemorrhage or perforation: Secondary | ICD-10-CM | POA: Diagnosis present

## 2012-06-21 DIAGNOSIS — M549 Dorsalgia, unspecified: Secondary | ICD-10-CM | POA: Diagnosis present

## 2012-06-21 DIAGNOSIS — I10 Essential (primary) hypertension: Secondary | ICD-10-CM | POA: Diagnosis present

## 2012-06-21 DIAGNOSIS — M1710 Unilateral primary osteoarthritis, unspecified knee: Secondary | ICD-10-CM

## 2012-06-21 DIAGNOSIS — E039 Hypothyroidism, unspecified: Secondary | ICD-10-CM | POA: Diagnosis present

## 2012-06-21 DIAGNOSIS — G8929 Other chronic pain: Secondary | ICD-10-CM | POA: Diagnosis present

## 2012-06-21 DIAGNOSIS — E785 Hyperlipidemia, unspecified: Secondary | ICD-10-CM | POA: Diagnosis present

## 2012-06-21 DIAGNOSIS — I2510 Atherosclerotic heart disease of native coronary artery without angina pectoris: Secondary | ICD-10-CM | POA: Diagnosis present

## 2012-06-21 DIAGNOSIS — F329 Major depressive disorder, single episode, unspecified: Secondary | ICD-10-CM | POA: Diagnosis present

## 2012-06-21 LAB — CBC
HCT: 33.3 % — ABNORMAL LOW (ref 36.0–46.0)
Hemoglobin: 11 g/dL — ABNORMAL LOW (ref 12.0–15.0)
MCH: 29.4 pg (ref 26.0–34.0)
MCHC: 33 g/dL (ref 30.0–36.0)
MCV: 89 fL (ref 78.0–100.0)

## 2012-06-21 LAB — BASIC METABOLIC PANEL
BUN: 11 mg/dL (ref 6–23)
Calcium: 8.5 mg/dL (ref 8.4–10.5)
GFR calc non Af Amer: 89 mL/min — ABNORMAL LOW (ref >90)
Glucose, Bld: 122 mg/dL — ABNORMAL HIGH (ref 70–99)

## 2012-06-21 MED ORDER — METHOCARBAMOL 500 MG PO TABS
500.0000 mg | ORAL_TABLET | Freq: Four times a day (QID) | ORAL | Status: DC | PRN
  Administered 2012-06-21 (×2): 1000 mg via ORAL
  Administered 2012-06-22: 500 mg via ORAL
  Administered 2012-06-22 – 2012-06-23 (×2): 1000 mg via ORAL
  Filled 2012-06-21 (×3): qty 2
  Filled 2012-06-21: qty 1
  Filled 2012-06-21: qty 2

## 2012-06-21 MED ORDER — MORPHINE SULFATE 2 MG/ML IJ SOLN
1.0000 mg | INTRAMUSCULAR | Status: DC | PRN
  Administered 2012-06-21 – 2012-06-23 (×10): 2 mg via INTRAVENOUS
  Filled 2012-06-21 (×10): qty 1

## 2012-06-21 NOTE — Progress Notes (Signed)
Orthopaedic Trauma Service (OTS)  Subjective: 1 Day Post-Op Procedure(s) (LRB): TOTAL KNEE ARTHROPLASTY (Right)    Pt in pain today  Block did not work post op Did get some sleep last night but not much Has been in CPM, tolerated it Denies CP, SOB, No N/V Denies palpitations Denies L arm pain  Has not eaten much, not all that hungry  Objective: Current Vitals Blood pressure 130/78, pulse 100, temperature 98.4 F (36.9 C), temperature source Oral, resp. rate 18, SpO2 96.00%. Vital signs in last 24 hours: Temp:  [97 F (36.1 C)-99.2 F (37.3 C)] 98.4 F (36.9 C) (11/16 0656) Pulse Rate:  [63-123] 100  (11/16 0656) Resp:  [14-24] 18  (11/16 0757) BP: (103-155)/(55-97) 130/78 mmHg (11/16 0656) SpO2:  [93 %-99 %] 96 % (11/16 0757) FiO2 (%):  [28 %] 28 % (11/15 1309)  Intake/Output from previous day: 11/15 0701 - 11/16 0700 In: 2600 [P.O.:600; I.V.:2000] Out: 1700 [Urine:1500; Drains:200]  Intake/Output Summary (Last 24 hours) at 06/21/12 0921 Last data filed at 06/21/12 0700  Gross per 24 hour  Intake   1600 ml  Output   1700 ml  Net   -100 ml   Intake/Output      11/15 0701 - 11/16 0700 11/16 0701 - 11/17 0700   P.O. 600    I.V. 2000    Total Intake 2600    Urine 1500    Drains 200    Total Output 1700    Net +900            LABS  Basename 06/21/12 0448  HGB 11.0*    Basename 06/21/12 0448  WBC 7.4  RBC 3.74*  HCT 33.3*  PLT 186    Basename 06/21/12 0448  NA 135  K 4.4  CL 102  CO2 26  BUN 11  CREATININE 0.77  GLUCOSE 122*  CALCIUM 8.5   No results found for this basename: LABPT:2,INR:2 in the last 72 hours   Physical Exam  Gen: Awake and alert Lungs:clear Cardiac:irregular, tachy Abd: + BS, NT Ext:      Right Lower Extremity  Dressing c/d/i  Drain patent  DPN, SPN, TN sensation intact  EHL, FHL, AT, PT, Peroneals and gastroc motor intact  + DP pulse  Compartments soft and NT, no pain with passive stretch  Ext  warm    Imaging Dg Chest 2 View  06/20/2012  *RADIOLOGY REPORT*  Clinical Data: Preoperative respiratory exam for knee replacement.  CHEST - 2 VIEW  Comparison: 10/14/2009  Findings: Heart size is normal.  Mediastinal shadows are normal. The lungs are clear.  Prominent epicardial fat is noted on the lateral view.  No effusions.  Chronic degenerative changes effect the spine.  IMPRESSION: No active disease   Original Report Authenticated By: Paulina Fusi, M.D.     Assessment/Plan: 1 Day Post-Op Procedure(s) (LRB): TOTAL KNEE ARTHROPLASTY (Right)  61 y/o female s/p R TKA  1. Endstage R knee OA s/p R TKA POD 1  WBAT  PT/OT  CPM  ICE and elevate  Dressing change tomorrow  No pillows under knee when at rest  2. HTN  Continue home meds 3. GERD  comtinue home meds 4. Hyperlipidemia  Home meds 5. Acute on Chronic pain  Continue with methadone  Change dilaudid to morphine as pt states this has worked for her in the past  Continue gabapentin 6. CAD, h/o cath  Pt HR irregular on exam today, denies CP or palpitations, No SOB either  Will check EKG, do not see old ekg in system to compare to  Check cardiac enzymes as well  Consult cards, has seen Dr. Dietrich Pates in [place] in the past  Will place on Tele for the time being to monitor rhythm  7. FEN  Advance diet as tolerated   D/c foley today  8. DVT/PE prophylaxis  Lovenox 9. Dispo  Cards eval  F/u labs and ekg     Mearl Latin, PA-C [facility] [phone] (P) 06/21/2012, 9:19 AM

## 2012-06-21 NOTE — Progress Notes (Signed)
Appreciate Dr. Vern Claude involvement and assistance!  Myrene Galas, MD [facility] [phone] (p)

## 2012-06-21 NOTE — Evaluation (Signed)
Physical Therapy Evaluation Patient Details Name: Selena [name] MRN: [id number] DOB: 03/13/1951 Today's Date: 06/21/2012 Time: 8657-8469 PT Time Calculation (min): 35 min  PT Assessment / Plan / Recommendation Clinical Impression  Patient is a 61 yo female s/p right TKA.  Session limited by pain and fatigue.  Patient will benefit from acute PT to maximize independence prior to return home with family.  Recommend HHPT at discharge.    PT Assessment  Patient needs continued PT services    Follow Up Recommendations  Home health PT;Supervision/Assistance - 24 hour    Does the patient have the potential to tolerate intense rehabilitation      Barriers to Discharge None      Equipment Recommendations  Rolling walker with 5" wheels    Recommendations for Other Services     Frequency 7X/week    Precautions / Restrictions Precautions Precautions: Knee;Fall Precaution Booklet Issued: Yes (comment) Precaution Comments: Reviewed precautions with patient/husband. Required Braces or Orthoses: Knee Immobilizer - Right Knee Immobilizer - Right: On when out of bed or walking Restrictions Weight Bearing Restrictions: Yes RLE Weight Bearing: Weight bearing as tolerated   Pertinent Vitals/Pain Pain limiting mobility today.      Mobility  Bed Mobility Bed Mobility: Supine to Sit;Sitting - Scoot to Edge of Bed Supine to Sit: 3: Mod assist;With rails;HOB elevated Sitting - Scoot to Edge of Bed: 3: Mod assist;With rail Details for Bed Mobility Assistance: Verbal cues for technique.  Assist to move RLE off bed. Transfers Transfers: Sit to Stand;Stand to Sit Sit to Stand: 3: Mod assist;From elevated surface;With upper extremity assist;From bed Stand to Sit: 3: Mod assist;With upper extremity assist;With armrests;To chair/3-in-1 Stand Pivot Transfers: 1: +2 Total assist Stand Pivot Transfers: Patient Percentage: 70% Details for Transfer Assistance: Verbal cues for hand placement and  placement of RLE during transitions.  Assist to initiate standing from bed, and to control descent into chair.  Patient able to take 3-4 short steps to pivot to chair. Ambulation/Gait Ambulation/Gait Assistance: Not tested (comment)    Shoulder Instructions     Exercises Total Joint Exercises Ankle Circles/Pumps: AROM;Both;10 reps;Seated   PT Diagnosis: Difficulty walking;Generalized weakness;Acute pain  PT Problem List: Decreased strength;Decreased range of motion;Decreased activity tolerance;Decreased balance;Decreased mobility;Decreased knowledge of use of DME;Decreased knowledge of precautions;Pain;Obesity PT Treatment Interventions: DME instruction;Gait training;Stair training;Functional mobility training;Therapeutic exercise;Patient/family education   PT Goals Acute Rehab PT Goals PT Goal Formulation: With patient/family Time For Goal Achievement: 06/28/12 Potential to Achieve Goals: Good Pt will go Supine/Side to Sit: with supervision;with HOB 0 degrees PT Goal: Supine/Side to Sit - Progress: Goal set today Pt will go Sit to Supine/Side: with min assist;with HOB 0 degrees PT Goal: Sit to Supine/Side - Progress: Goal set today Pt will go Sit to Stand: with min assist;with upper extremity assist PT Goal: Sit to Stand - Progress: Goal set today Pt will go Stand to Sit: with supervision;with upper extremity assist PT Goal: Stand to Sit - Progress: Goal set today Pt will Ambulate: 51 - 150 feet;with supervision;with rolling walker PT Goal: Ambulate - Progress: Goal set today Pt will Go Up / Down Stairs: 1-2 stairs;with min assist;with rail(s);with least restrictive assistive device PT Goal: Up/Down Stairs - Progress: Goal set today Pt will Perform Home Exercise Program: with supervision, verbal cues required/provided PT Goal: Perform Home Exercise Program - Progress: Goal set today  Visit Information  Last PT Received On: 06/21/12 Assistance Needed: +2    Subjective Data   Subjective: "We'll try  to get up.  My leg hurts pretty bad" Patient Stated Goal: To walk   Prior Functioning  Home Living Lives With: Spouse (Granddaughter) Available Help at Discharge: Family;Available 24 hours/day (Granddaughter helping when husband at work.) Type of Home: House Home Access: Stairs to enter Secretary/administrator of Steps: 2 Entrance Stairs-Rails: Right;Left Home Layout: One level Bathroom Shower/Tub: Tub/shower unit;Door [employer] Toilet: Handicapped height Bathroom Accessibility: Yes How Accessible: Accessible via walker Home Adaptive Equipment: Walker - standard;Straight cane Prior Function Level of Independence: Independent with assistive device(s) Able to Take Stairs?: Yes Driving: Yes Vocation: Unemployed Communication Communication: No difficulties    Cognition  Overall Cognitive Status: Appears within functional limits for tasks assessed/performed Arousal/Alertness: Awake/alert Orientation Level: Oriented X4 / Intact Behavior During Session: [facility] for tasks performed    Extremity/Trunk Assessment Right Upper Extremity Assessment RUE ROM/Strength/Tone: WFL for tasks assessed RUE Sensation: WFL - Light Touch Left Upper Extremity Assessment LUE ROM/Strength/Tone: WFL for tasks assessed LUE Sensation: WFL - Light Touch Right Lower Extremity Assessment RLE ROM/Strength/Tone: Deficits;Unable to fully assess;Due to pain RLE ROM/Strength/Tone Deficits: Needed asssit to move against gravity.  Grossly 3-/5 RLE Sensation: WFL - Light Touch Left Lower Extremity Assessment LLE ROM/Strength/Tone: WFL for tasks assessed LLE Sensation: WFL - Light Touch   Balance    End of Session PT - End of Session Equipment Utilized During Treatment: Gait belt;Right knee immobilizer Activity Tolerance: Patient limited by pain;Patient limited by fatigue Patient left: in chair;with call bell/phone within reach;with family/visitor present Nurse Communication: Mobility  status CPM Right Knee CPM Right Knee: Off  GP     Vena Austria 06/21/2012, 10:39 AM Durenda Hurt. Renaldo Fiddler, [facility] Acute Rehab Services Pager [phone]

## 2012-06-21 NOTE — Progress Notes (Signed)
Physical Therapy Note  06/21/12 1407  PT Visit Information  Last PT Received On 06/21/12  Assistance Needed +2  PT Time Calculation  PT Start Time 1336  PT Stop Time 1405  PT Time Calculation (min) 29 min  Subjective Data  Subjective Patient tearful. "I wish my leg didn't hurt so bad"  Precautions  Precautions Knee;Fall  Required Braces or Orthoses Knee Immobilizer - Right  Knee Immobilizer - Right On when out of bed or walking  Restrictions  Weight Bearing Restrictions Yes  RLE Weight Bearing WBAT  Cognition  Overall Cognitive Status Appears within functional limits for tasks assessed/performed  Arousal/Alertness Awake/alert  Orientation Level Oriented X4 / Intact  Behavior During Session [facility] for tasks performed  Exercises  Exercises Total Joint  Total Joint Exercises  Ankle Circles/Pumps AROM;Both;10 reps;Supine  Quad Sets AROM;Right;10 reps;Supine  Towel Squeeze AROM;Both;10 reps;Supine  Heel Slides AAROM;Right;5 reps;Supine  Hip ABduction/ADduction AAROM;Right;10 reps;Supine  PT - End of Session  Activity Tolerance Patient limited by pain  Patient left in bed;in CPM;with call bell/phone within reach  Nurse Communication Other (comment) (Pain remains high; CPM on)  PT - Assessment/Plan  Comments on Treatment Session Patient continued to have increased pain, limiting progress with PT.  Required +2 to apply CPM due to pain with movement of RLE.  PT Plan Discharge plan remains appropriate;Frequency remains appropriate  PT Frequency 7X/week  Follow Up Recommendations Home health PT;Supervision/Assistance - 24 hour  Equipment Recommended Rolling walker with 5" wheels  Acute Rehab PT Goals  PT Goal: Perform Home Exercise Program - Progress Progressing toward goal  PT General Charges  $$ ACUTE PT VISIT 1 Procedure  PT Treatments  $Therapeutic Exercise 23-37 mins   Durenda Hurt. Renaldo Fiddler, [facility] Acute Rehab Services Pager [phone]

## 2012-06-21 NOTE — Op Note (Signed)
Selena [name], Selena [name] NO.:  [id number]  MEDICAL RECORD NO.:  [id number]  LOCATION:  5N05C                        FACILITY:  [facility]  PHYSICIAN:  Dyke Brackett, M.D.    DATE OF BIRTH:  November 04, 1950  DATE OF PROCEDURE:  06/20/2012 DATE OF DISCHARGE:                              OPERATIVE REPORT   INDICATION:  This is a 61 year old with intractable knee pain, osteoarthritis, right knee, not responding to conservative treatment, thought to be amenable to hospitalization, total knee replacement.  PREOPERATIVE DIAGNOSIS:  Osteoarthritis, right knee.  POSTOPERATIVE DIAGNOSIS:  Osteoarthritis, right knee.  OPERATION:  Right total knee replacement (Sigma cemented DePuy knee, size 3 femur, tibia 10 mm bearing with 35 mm all poly patella).  SURGEON:  Dyke Brackett, MD  ASSISTANT:  Jaquelyn Bitter. Chabon, PA  TOURNIQUET TIME:  60 minutes.  DESCRIPTION OF PROCEDURE:  Sterile prep and drape, exsanguination of the leg, inflation to 350.  Straight skin incision, medial parapatellar approach made to the knee.  We cut 11 mm off the distal femur due to a flexion contracture.  We then cut the tibia about 3-4 mm below the most diseased medial compartment.  There is moderate varus deformity requiring medial release.  Medial release was accomplished without difficulty and the extension gap measured at 10 mm.  We next sized the femur to be a size 3.  We then placed the appropriate guide for the rotational and anterior-posterior cuts, setting the pin holes with a 10 mm shim.  We then placed those pins and then did the chamfers anterior-posterior cuts. Flexion gap equalled the extension gap at 10 mm.  Attention was next directed back to the tibia, cut a keel hole for the tibial and a trial tibia was placed at the base plate.  We then went back and cut the box for the femur and placed the trial on the femur and the tibia.  Patella was cut resecting about 8 mm native patella, leaving  about 16 mm of native patella.  There was full extension, good flexion, no instability with the trial stem.  The patient had some excoriated skin and venous insufficiency.  Due to that fact, I elected to use antibiotic impregnated cement, 1.2 g of tobramycin per batch, 2 batches mixed into the doughy state, the components were inserted to tibia followed by femur and patella.  We used a trial bearing, allowed this to harden.  Trial bearing was removed.  Excess cement was removed from the edges of the prosthesis. Tourniquet was released.  No excess bleeding was noted from posterior aspect of the knee.  The final bearing was placed.  Wound was copiously irrigated with pulsatile lavage.  The bony surfaces were lavaged prior to insertion of the cement and the prosthesis.  Hemovac drain was placed exiting superolaterally and closure was affected with #1 Ethibond 2-0 Vicryl and skin clips.  Light compressive sterile dressing applied, taken to recovery room in stable condition.     Dyke Brackett, M.D.     WDC/MEDQ  D:  06/20/2012  T:  06/21/2012  Job:  [id number]

## 2012-06-21 NOTE — Progress Notes (Signed)
Ortho Addendum  Discussed EKG with Dr. Daleen Squibb (cards).  Reviewed old ekg report, echo and new ekg.  Some PVC's noted, low voltage ekg noted.  No acute concerns.  Will continue to monitor clinically.  No additional tele or lab eval.   Will contact cards again with any additional concerns or questions  Mearl Latin, PA-C [facility] [phone] (P) 06/21/2012 10:03 AM

## 2012-06-22 LAB — CBC
MCH: 29.4 pg (ref 26.0–34.0)
MCV: 90.3 fL (ref 78.0–100.0)
Platelets: 181 10*3/uL (ref 150–400)
RDW: 14.8 % (ref 11.5–15.5)

## 2012-06-22 LAB — COMPREHENSIVE METABOLIC PANEL
AST: 36 U/L (ref 0–37)
BUN: 9 mg/dL (ref 6–23)
CO2: 30 mEq/L (ref 19–32)
Calcium: 8.6 mg/dL (ref 8.4–10.5)
Creatinine, Ser: 0.78 mg/dL (ref 0.50–1.10)
GFR calc non Af Amer: 89 mL/min — ABNORMAL LOW (ref >90)

## 2012-06-22 NOTE — Progress Notes (Signed)
Physical Therapy Treatment Patient Details Name: Selena [name] MRN: [id number] DOB: [birth year] Today's Date: 06/22/2012 Time: 1478-2956 PT Time Calculation (min): 23 min  PT Assessment / Plan / Recommendation Comments on Treatment Session  Pt progressing with PT goals at this date.  Able to increase ambulation distance.      Follow Up Recommendations  Home health PT;Supervision/Assistance - 24 hour     Does the patient have the potential to tolerate intense rehabilitation     Barriers to Discharge        Equipment Recommendations  Rolling walker with 5" wheels;3 in 1 bedside comode    Recommendations for Other Services    Frequency 7X/week   Plan Discharge plan remains appropriate;Frequency remains appropriate    Precautions / Restrictions Precautions Precautions: Knee;Fall Required Braces or Orthoses: Knee Immobilizer - Right Knee Immobilizer - Right: On when out of bed or walking Restrictions Weight Bearing Restrictions: Yes RLE Weight Bearing: Weight bearing as tolerated    Pertinent Vitals/Pain 8/10 R knee.  Repositioned.  Pain medication recently per pt.      Mobility  Bed Mobility Bed Mobility: Supine to Sit;Sitting - Scoot to Edge of Bed Supine to Sit: 4: Min assist;HOB flat;With rails Sitting - Scoot to Edge of Bed: 4: Min assist Details for Bed Mobility Assistance: Assist to support RLE OOB. VC for technique. Transfers Transfers: Sit to Stand;Stand to Sit Sit to Stand: 3: Mod assist;4: Min assist;From bed;From chair/3-in-1;With armrests;With upper extremity assist Stand to Sit: 4: Min assist;To chair/3-in-1;With armrests;With upper extremity assist Details for Transfer Assistance: Mod assist for sit<>stand from bed. Min assist from 3n1.  Min verbal cueing for RLE positioning during transfer and hand placement. Ambulation/Gait Ambulation/Gait Assistance: 4: Min guard Ambulation Distance (Feet): 30 Feet Assistive device: Rolling walker Ambulation/Gait  Assistance Details: Cues for tall posture, encouragement to increase WBAT but also use of UE's to offload weight from RLE to decrease pain if needed.   Gait Pattern: Step-to pattern;Decreased stance time - right;Decreased step length - left;Trunk flexed      PT Goals Acute Rehab PT Goals Time For Goal Achievement: 06/28/12 Potential to Achieve Goals: Good Pt will go Supine/Side to Sit: with supervision;with HOB 0 degrees PT Goal: Supine/Side to Sit - Progress: Progressing toward goal Pt will go Sit to Supine/Side: with min assist;with HOB 0 degrees Pt will go Sit to Stand: with min assist;with upper extremity assist PT Goal: Sit to Stand - Progress: Progressing toward goal Pt will go Stand to Sit: with supervision;with upper extremity assist PT Goal: Stand to Sit - Progress: Progressing toward goal Pt will Ambulate: 51 - 150 feet;with supervision;with rolling walker PT Goal: Ambulate - Progress: Progressing toward goal Pt will Go Up / Down Stairs: 1-2 stairs;with min assist;with rail(s);with least restrictive assistive device Pt will Perform Home Exercise Program: with supervision, verbal cues required/provided  Visit Information  Last PT Received On: 06/22/12 Assistance Needed: +1    Subjective Data      Cognition  Overall Cognitive Status: Appears within functional limits for tasks assessed/performed Arousal/Alertness: Awake/alert Orientation Level: Appears intact for tasks assessed Behavior During Session: [facility] for tasks performed    Balance     End of Session PT - End of Session Equipment Utilized During Treatment: Gait belt;Right knee immobilizer Activity Tolerance: Patient tolerated treatment well Patient left: in chair;with call bell/phone within reach;with family/visitor present Nurse Communication: Mobility status     Selena [name], [place] [phone] 06/22/2012

## 2012-06-22 NOTE — Progress Notes (Signed)
[facility] (OTS)  Subjective: 2 Days Post-Op Procedure(s) (LRB): TOTAL KNEE ARTHROPLASTY (Right)  Doing a little better this am Sitting in chair No CP, No SOB, No palpitations  Tolerating diet Voiding well Has been walking around room  Still has yet to walk distance with therapy and do stairs   Objective: Current Vitals Blood pressure 98/62, pulse 86, temperature 98.8 F (37.1 C), temperature source Oral, resp. rate 18, SpO2 94.00%. Vital signs in last 24 hours: Temp:  [98.6 F (37 C)-99.3 F (37.4 C)] 98.8 F (37.1 C) (11/17 0643) Pulse Rate:  [66-94] 86  (11/17 0643) Resp:  [18-20] 18  (11/17 0643) BP: (98-141)/(62-78) 98/62 mmHg (11/17 0643) SpO2:  [94 %-99 %] 94 % (11/17 0643)  Intake/Output from previous day: 11/16 0701 - 11/17 0700 In: 1355 [P.O.:1280] Out: 1900 [Urine:1800; Drains:100] Intake/Output      11/16 0701 - 11/17 0700 11/17 0701 - 11/18 0700   P.O. 1280    I.V.     Other 75    Total Intake 1355    Urine 1800    Drains 100    Total Output 1900    Net -545         Urine Occurrence 3 x       LABS  Basename 06/22/12 0540 06/21/12 0448  HGB 10.9* 11.0*    Basename 06/22/12 0540 06/21/12 0448  WBC 8.5 7.4  RBC 3.71* 3.74*  HCT 33.5* 33.3*  PLT 181 186    Basename 06/22/12 0540 06/21/12 0448  NA 138 135  K 3.7 4.4  CL 100 102  CO2 30 26  BUN 9 11  CREATININE 0.78 0.77  GLUCOSE 175* 122*  CALCIUM 8.6 8.5   No results found for this basename: LABPT:2,INR:2 in the last 72 hours   Physical Exam  Gen: Awake and alert Lungs: Clear Cardiac: Regular, S1 and S2 Abd:+ Bowel sounds, nontender Ext:  Right lower extremity   Dressings removed today, incision looks excellent    Drain was removed without difficulty   Distal motor and sensory functions are intact   Weak straight leg raise.   Swelling is stable   Compartments soft and nontender   No pain with passive stretch   No deep calf tenderness   Extremity is  warm with palpable dorsalis pedis pulse      Imaging No results found.  Assessment/Plan: 2 Days Post-Op Procedure(s) (LRB): TOTAL KNEE ARTHROPLASTY (Right)  61 y/o female s/p R TKA   1. 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 HTN   Continue home meds  3. GERD   comtinue home meds  4. Hyperlipidemia   Home meds  5. Acute on Chronic pain   Continue with methadone   Continue with morphine   Continue gabapentin   PO oxycodone 6. CAD, h/o cath   Cardiac exam improved today no acute issues 7. FEN   Advance diet as tolerated   Normal saline lock 8. DVT/PE prophylaxis   Lovenox  9. Dispo   Ready for DC tomorrow  Mearl Latin, PA-C [facility] [phone] (P) 06/22/2012, 9:14 AM

## 2012-06-22 NOTE — Evaluation (Signed)
Occupational Therapy Evaluation Patient Details Name: Selena [name] MRN: [id number] DOB: [birth year] Today's Date: 06/22/2012 Time: 4098-1191 OT Time Calculation (min): 26 min  OT Assessment / Plan / Recommendation Clinical Impression  Pt s/p R TKA.  Will benefit from acute OT services to address below problem list in prep for d/c home with family.    OT Assessment  Patient needs continued OT Services    Follow Up Recommendations  No OT follow up;Supervision/Assistance - 24 hour    Barriers to Discharge      Equipment Recommendations  Rolling walker with 5" wheels;3 in 1 bedside comode (tub DME TBD)    Recommendations for Other Services    Frequency  Min 2X/week    Precautions / Restrictions Precautions Precautions: Knee;Fall Required Braces or Orthoses: Knee Immobilizer - Right Knee Immobilizer - Right: On when out of bed or walking Restrictions Weight Bearing Restrictions: Yes RLE Weight Bearing: Weight bearing as tolerated   Pertinent Vitals/Pain See vitals    ADL  Eating/Feeding: Performed;Independent Where Assessed - Eating/Feeding: Edge of bed Grooming: Performed;Wash/dry hands;Teeth care;Min guard Where Assessed - Grooming: Unsupported standing Upper Body Bathing: Simulated;Set up Where Assessed - Upper Body Bathing: Unsupported sitting Lower Body Bathing: Simulated;Moderate assistance Where Assessed - Lower Body Bathing: Supported sit to stand Upper Body Dressing: Performed;Set up Where Assessed - Upper Body Dressing: Unsupported sitting Lower Body Dressing: Simulated;Moderate assistance Where Assessed - Lower Body Dressing: Supported sit to Pharmacist, hospital: Performed;Moderate assistance Toilet Transfer Method: Sit to stand Toilet Transfer Equipment: Raised toilet seat with arms (or 3-in-1 over toilet) Toileting - Clothing Manipulation and Hygiene: Performed;Min guard Where Assessed - Toileting Clothing Manipulation and Hygiene: Standing Equipment  Used: Gait belt;Knee Immobilizer;Rolling walker Transfers/Ambulation Related to ADLs: min assist with RW. VC for sequencing and technique. ADL Comments: Pt and spouse report that they feel that a 3n1 would be beneficial for over the toilet even though they have handicapped height at home, feel that the armrests and additional height would be helpful.    OT Diagnosis: Acute pain  OT Problem List: Decreased activity tolerance;Pain;Decreased knowledge of use of DME or AE OT Treatment Interventions: Self-care/ADL training;DME and/or AE instruction;Therapeutic activities;Patient/family education   OT Goals Acute Rehab OT Goals OT Goal Formulation: With patient Time For Goal Achievement: 06/29/12 Potential to Achieve Goals: Good ADL Goals Pt Will Perform Lower Body Bathing: with supervision;Sit to stand from bed;Sit to stand from chair;with adaptive equipment ADL Goal: Lower Body Bathing - Progress: Goal set today Pt Will Perform Lower Body Dressing: with supervision;Sit to stand from chair;Sit to stand from bed;with adaptive equipment ADL Goal: Lower Body Dressing - Progress: Goal set today Pt Will Transfer to Toilet: with supervision;Ambulation;with DME;Comfort height toilet ADL Goal: Toilet Transfer - Progress: Goal set today Pt Will Perform Tub/Shower Transfer: Tub transfer;with supervision;Ambulation;with DME;Shower seat with back;Transfer tub bench (bench vs seat TBD) ADL Goal: Tub/Shower Transfer - Progress: Goal set today  Visit Information  Last OT Received On: 06/22/12 Assistance Needed: +1 PT/OT Co-Evaluation/Treatment: Yes    Subjective Data      Prior Functioning     Home Living Lives With: Spouse (granddaughter) Available Help at Discharge: Family;Available 24 hours/day Type of Home: House Home Access: Stairs to enter [employer] of Steps: 2 Entrance Stairs-Rails: Right;Left Home Layout: One level Bathroom Shower/Tub: Tub/shower unit;Door [employer]  Toilet: Handicapped height Bathroom Accessibility: Yes How Accessible: Accessible via walker Home Adaptive Equipment: Walker - standard;Straight cane Prior Function Level of Independence: Independent with assistive  device(s) Able to Take Stairs?: Yes Driving: Yes Vocation: Unemployed Communication Communication: No difficulties         Vision/Perception     Cognition  Overall Cognitive Status: Appears within functional limits for tasks assessed/performed Arousal/Alertness: Awake/alert Orientation Level: Appears intact for tasks assessed Behavior During Session: [facility] for tasks performed    Extremity/Trunk Assessment Right Upper Extremity Assessment RUE ROM/Strength/Tone: [facility] for tasks assessed Left Upper Extremity Assessment LUE ROM/Strength/Tone: [facility] for tasks assessed     Mobility Bed Mobility Bed Mobility: Supine to Sit;Sitting - Scoot to Edge of Bed Supine to Sit: 4: Min assist Sitting - Scoot to Edge of Bed: 4: Min assist Details for Bed Mobility Assistance: Assist to support RLE OOB. VC for technique. Transfers Transfers: Sit to Stand;Stand to Sit Sit to Stand: 3: Mod assist;4: Min assist;From bed;From chair/3-in-1;With armrests;With upper extremity assist Stand to Sit: 4: Min assist;To chair/3-in-1;With armrests;With upper extremity assist Details for Transfer Assistance: Mod assist for sit<>stand from bed. Min assist from 3n1.  Min verbal cueing for RLE positioning during transfer and hand placement.     Shoulder Instructions     Exercise     Balance     End of Session OT - End of Session Equipment Utilized During Treatment: Gait belt;Right knee immobilizer Activity Tolerance: Patient tolerated treatment well Patient left: in chair;with call bell/phone within reach;with family/visitor present Nurse Communication: Mobility status  GO    06/22/2012 Cipriano Mile OTR/L Pager [phone] Office [phone]  Cipriano Mile 06/22/2012, 8:40  AM

## 2012-06-22 NOTE — Progress Notes (Signed)
Physical Therapy Treatment Patient Details Name: Selena [name] MRN: [id number] DOB: July 07, 1951 Today's Date: 06/22/2012 Time: 4259-5638 PT Time Calculation (min): 23 min  PT Assessment / Plan / Recommendation Comments on Treatment Session  Cont's to progress with mobility despite pain.      Follow Up Recommendations  Home health PT;Supervision/Assistance - 24 hour     Does the patient have the potential to tolerate intense rehabilitation     Barriers to Discharge        Equipment Recommendations  Rolling walker with 5" wheels;3 in 1 bedside comode    Recommendations for Other Services    Frequency 7X/week   Plan Discharge plan remains appropriate;Frequency remains appropriate    Precautions / Restrictions Precautions Precautions: Knee;Fall Required Braces or Orthoses: Knee Immobilizer - Right Knee Immobilizer - Right: On when out of bed or walking Restrictions Weight Bearing Restrictions: Yes RLE Weight Bearing: Weight bearing as tolerated   Pertinent Vitals/Pain 8/10  Knee.  Increased pain in knee with LE there-ex.      Mobility  Bed Mobility Bed Mobility: Supine to Sit;Sitting - Scoot to Edge of Bed;Sit to Supine Supine to Sit: 4: Min guard;HOB flat;With rails Sitting - Scoot to Edge of Bed: 4: Min guard Sit to Supine: 4: Min assist;HOB flat Details for Bed Mobility Assistance: No physical (A) to get OOB & only minor Min (A) to lift RLE back into bed.   Transfers Transfers: Sit to Stand;Stand to Sit Sit to Stand: 4: Min guard;With upper extremity assist;From bed Stand to Sit: 4: Min guard;With upper extremity assist;To bed Details for Transfer Assistance: Guarding for safety.   Ambulation/Gait Ambulation/Gait Assistance: 4: Min guard Ambulation Distance (Feet): 60 Feet Assistive device: Rolling walker Ambulation/Gait Assistance Details: Pt with short, quick steps.   Gait Pattern: Step-to pattern;Decreased stance time - right;Decreased step length -  right;Decreased step length - left;Antalgic;Trunk flexed Stairs: No Wheelchair Mobility Wheelchair Mobility: No    Exercises Total Joint Exercises Ankle Circles/Pumps: AROM;Both;15 reps Quad Sets: AROM;Right;10 reps Heel Slides: AAROM;Right;Other reps (comment) (8 reps; limited by pain) Hip ABduction/ADduction: AAROM;Right;10 reps Straight Leg Raises: AAROM;Right;10 reps     PT Goals Acute Rehab PT Goals Time For Goal Achievement: 06/28/12 Potential to Achieve Goals: Good Pt will go Supine/Side to Sit: with supervision;with HOB 0 degrees PT Goal: Supine/Side to Sit - Progress: Progressing toward goal Pt will go Sit to Supine/Side: with min assist;with HOB 0 degrees PT Goal: Sit to Supine/Side - Progress: Progressing toward goal Pt will go Sit to Stand: with min assist;with upper extremity assist PT Goal: Sit to Stand - Progress: Progressing toward goal Pt will go Stand to Sit: with supervision;with upper extremity assist PT Goal: Stand to Sit - Progress: Progressing toward goal Pt will Ambulate: 51 - 150 feet;with supervision;with rolling walker PT Goal: Ambulate - Progress: Progressing toward goal Pt will Go Up / Down Stairs: 1-2 stairs;with min assist;with rail(s);with least restrictive assistive device Pt will Perform Home Exercise Program: with supervision, verbal cues required/provided PT Goal: Perform Home Exercise Program - Progress: Progressing toward goal  Visit Information  Last PT Received On: 06/22/12 Assistance Needed: +1    Subjective Data      Cognition  Overall Cognitive Status: Appears within functional limits for tasks assessed/performed Arousal/Alertness: Awake/alert Orientation Level: Appears intact for tasks assessed Behavior During Session: [facility] for tasks performed    Balance     End of Session PT - End of Session Equipment Utilized During Treatment: Gait belt;Right knee  immobilizer Activity Tolerance: Patient tolerated treatment well Patient  left: in chair;with call bell/phone within reach;with family/visitor present Nurse Communication: Mobility status     Verdell Face, [place] [phone] 06/22/2012

## 2012-06-23 ENCOUNTER — Encounter (HOSPITAL_COMMUNITY): Payer: Self-pay | Admitting: Orthopedic Surgery

## 2012-06-23 LAB — CBC
Hemoglobin: 10.5 g/dL — ABNORMAL LOW (ref 12.0–15.0)
MCH: 29.7 pg (ref 26.0–34.0)
RBC: 3.53 MIL/uL — ABNORMAL LOW (ref 3.87–5.11)
WBC: 7.4 10*3/uL (ref 4.0–10.5)

## 2012-06-23 NOTE — Progress Notes (Signed)
Subjective: 3 Days Post-Op Procedure(s) (LRB): TOTAL KNEE ARTHROPLASTY (Right) Patient reports pain as mild and moderate.    Objective: Vital signs in last 24 hours: Temp:  [98.6 F (37 C)-100.2 F (37.9 C)] 98.6 F (37 C) (11/18 0706) Pulse Rate:  [88-95] 88  (11/18 0706) Resp:  [18] 18  (11/18 0706) BP: (128-140)/(62-76) 140/76 mmHg (11/18 0706) SpO2:  [95 %-99 %] 95 % (11/18 0706)  Intake/Output from previous day: 11/17 0701 - 11/18 0700 In: 1380 [P.O.:1380] Out: -  Intake/Output this shift:     Basename 06/23/12 0500 06/22/12 0540 06/21/12 0448  HGB 10.5* 10.9* 11.0*    Basename 06/23/12 0500 06/22/12 0540  WBC 7.4 8.5  RBC 3.53* 3.71*  HCT 32.1* 33.5*  PLT 186 181    Basename 06/22/12 0540 06/21/12 0448  NA 138 135  K 3.7 4.4  CL 100 102  CO2 30 26  BUN 9 11  CREATININE 0.78 0.77  GLUCOSE 175* 122*  CALCIUM 8.6 8.5   No results found for this basename: LABPT:2,INR:2 in the last 72 hours  Neurovascular intact Sensation intact distally Intact pulses distally Dorsiflexion/Plantar flexion intact Incision: dressing C/D/I  Assessment/Plan: 3 Days Post-Op Procedure(s) (LRB): TOTAL KNEE ARTHROPLASTY (Right) Up with therapy Discharge home with home health lovenox teaching prior to d/c home  Margart Sickles 06/23/2012, 8:28 AM

## 2012-06-23 NOTE — Progress Notes (Signed)
Physical Therapy Treatment Patient Details Name: Selena [name] MRN: [id number] DOB: 03/16/1951 Today's Date: 06/23/2012 Time: 0752-0819 PT Time Calculation (min): 27 min  PT Assessment / Plan / Recommendation Comments on Treatment Session  Patient continuing to progress with therapy. Patient still tends to be limited by overall pain. States she has gotten up to the restroom alot since last night and is getting out of bed without assistance. Will plan to attempt stairs later this morning/afternoon in plans for possible discharge. Patient states she is hoping to return home with her granddaughter later today    Follow Up Recommendations  Home health PT;Supervision/Assistance - 24 hour     Does the patient have the potential to tolerate intense rehabilitation     Barriers to Discharge        Equipment Recommendations  Rolling walker with 5" wheels;3 in 1 bedside comode    Recommendations for Other Services    Frequency 7X/week   Plan Discharge plan remains appropriate;Frequency remains appropriate    Precautions / Restrictions Precautions Precautions: Knee;Fall Precaution Booklet Issued: Yes (comment) Required Braces or Orthoses: Knee Immobilizer - Right Restrictions RLE Weight Bearing: Weight bearing as tolerated   Pertinent Vitals/Pain     Mobility  Bed Mobility Bed Mobility: Not assessed Transfers Sit to Stand: 6: Modified independent (Device/Increase time) Stand to Sit: 6: Modified independent (Device/Increase time) Ambulation/Gait Ambulation/Gait Assistance: 5: Supervision Ambulation Distance (Feet): 120 Feet Assistive device: Rolling walker Ambulation/Gait Assistance Details: No cues required. Supervision for safety Gait Pattern: Step-to pattern;Decreased step length - right;Decreased step length - left;Antalgic    Exercises Total Joint Exercises Quad Sets: AROM;Right;15 reps Short Arc QuadBarbaraann Boys;Right;10 reps Heel Slides: AAROM;Right;15 reps Hip  ABduction/ADduction: AAROM;Right;15 reps Straight Leg Raises: AAROM;Right;10 reps   PT Diagnosis:    PT Problem List:   PT Treatment Interventions:     PT Goals Acute Rehab PT Goals PT Goal: Sit to Stand - Progress: Met PT Goal: Stand to Sit - Progress: Met PT Goal: Ambulate - Progress: Met PT Goal: Perform Home Exercise Program - Progress: Progressing toward goal  Visit Information  Last PT Received On: 06/23/12 Assistance Needed: +1    Subjective Data      Cognition  Overall Cognitive Status: Appears within functional limits for tasks assessed/performed Arousal/Alertness: Awake/alert Orientation Level: Appears intact for tasks assessed Behavior During Session: [facility] for tasks performed    Balance     End of Session PT - End of Session Equipment Utilized During Treatment: Gait belt Activity Tolerance: Patient tolerated treatment well;Patient limited by pain Patient left: in chair;with call bell/phone within reach Nurse Communication: Mobility status   GP     Fredrich Birks 06/23/2012, 8:25 AM 06/23/2012 Fredrich Birks PTA [phone] pager [phone] office

## 2012-06-23 NOTE — Progress Notes (Signed)
CARE MANAGEMENT NOTE 06/23/2012  Patient:  Selena [name], Selena [name]   Account Number:  [id number]  Date Initiated:  06/23/2012  Documentation initiated by:  Vance Peper  Subjective/Objective Assessment:   61 yr old female s/p right total knee arthroplasty.     Action/Plan:   CM spooke with patient regarding home health and DME needs at discharge. Contacted her husband at home. Choice offered. Patient will need 3in1,walker and CPM. Confirmed with Rhonda at [employer].   Anticipated DC Date:  06/23/2012   Anticipated DC Plan:  HOME W HOME HEALTH SERVICES      DC Planning Services  CM consult      [facility] Choice  HOME HEALTH  DURABLE MEDICAL EQUIPMENT   Choice offered to / List presented to:  C-3 Spouse   DME arranged  3-N-1  WALKER - ROLLING  CPM      DME agency  [employer]     HH arranged  HH-2 PT      HH agency  [facility]   Status of service:  Completed, signed off Medicare Important Message given?   (If response is "NO", the following Medicare IM given date fields will be blank) Date Medicare IM given:   Date Additional Medicare IM given:    Discharge Disposition:  HOME W HOME HEALTH SERVICES  Per UR Regulation:    If discussed at Long Length of Stay Meetings, dates discussed:    Comments:

## 2012-06-23 NOTE — Discharge Summary (Signed)
Home  PATIENT ID: Selena [name]        MRN:  [id number]          DOB/AGE: 1950/11/22 / 61 y.o.    DISCHARGE SUMMARY  ADMISSION DATE:    06/20/2012 DISCHARGE DATE:   06/23/2012   ADMISSION DIAGNOSIS: OA OF RIGHT KNEE, DEGENERATIVE ARTHRITIS - LEG/KNEE 715.36    DISCHARGE DIAGNOSIS:  OA OF RIGHT KNEE, DEGENERATIVE ARTHRITIS - LEG/KNEE     ADDITIONAL DIAGNOSIS: Principal Problem:  *DJD (degenerative joint disease) of knee, end stage R knee Active Problems:  GERD (gastroesophageal reflux disease)  HTN (hypertension)  Depression  Hyperlipemia  Hypothyroidism  Chronic back pain  PUD (peptic ulcer disease)  CAD (coronary artery disease)  Past Medical History  Diagnosis Date  . Cervical ca 1984  . Constipation   . Hemorrhoids   . HTN (hypertension)   . Depression     w/ menopause  . Hyperlipemia   . Hypothyroidism   . Tachycardia     being evaluated recently by Dr. Alanda Amass  . TIA (transient ischemic attack) 1983  . GERD (gastroesophageal reflux disease)   . Bursitis   . Chronic abdominal pain     functional, seen @[facility] , extensive WU here benign   . Skin cancer     nose  . Chronic back pain     on methadone  . PUD (peptic ulcer disease) 1997/1998    negative h pylori  . Abnormal findings on imaging of biliary tract 1997-2000    CBD 21mm, Dr Danny Lawless biliary/pancreatic manometry, EUS Dr. Christella Hartigan 09/18/07 dilated but otherwise normal extrahepatic duct, no masses  . S/P colonoscopy 09/10/07    Dr. Teddy Spike sec to tortuous colon, followed by BE & flex sig normal  . CAD (coronary artery disease)   . MI (myocardial infarction)   . Shortness of breath     with exertion  . Stroke     1983  . H/O hiatal hernia   . Arthritis     PROCEDURE: Procedure(s): TOTAL KNEE ARTHROPLASTY Right  on 06/20/2012  CONSULTS:     HISTORY:  See H&P in chart  HOSPITAL COURSE:  FELICE HOPE is a 61 y.o. admitted on 06/20/2012 and found to have a diagnosis of OA OF RIGHT  KNEE, DEGENERATIVE ARTHRITIS - LEG/KNEE .  After appropriate laboratory studies were obtained  they were taken to the operating room on 06/20/2012 and underwent Procedure(s): TOTAL KNEE ARTHROPLASTY Right.   They were given perioperative antibiotics:  Anti-infectives     Start     Dose/Rate Route Frequency Ordered Stop   06/20/12 1400   clindamycin (CLEOCIN) IVPB 600 mg        600 mg 100 mL/hr over 30 Minutes Intravenous Every 6 hours 06/20/12 1308 06/20/12 2100   06/19/12 1421   clindamycin (CLEOCIN) IVPB 900 mg        900 mg 100 mL/hr over 30 Minutes Intravenous 60 min pre-op 06/19/12 1421 06/20/12 0755        .  Tolerated the procedure well.  Placed with a foley intraoperatively.  Given Ofirmev at induction and for 48 hours.    POD #1, allowed out of bed to a chair.  PT for ambulation and exercise program.  Foley D/C'd in morning.  IV saline locked.  O2 discontionued.  Irregular heart rhythm noticed on exam, repeated EKG discussed with cards reviewed echo, was having some PVC's no acute concerns, she was placed on tele during workup, but d/c following eval.  IVP pain med switched from dilaudid to morphine, tolerated this better with better pain control.  POD #2, continued PT and ambulation.   Hemovac pulled. .  The remainder of the hospital course was dedicated to ambulation and strengthening.   The patient was discharged on 3 Days Post-Op in  Stable condition.  Blood products given:none  DIAGNOSTIC STUDIES: Recent vital signs: Patient Vitals for the past 24 hrs:  BP Temp Temp src Pulse Resp SpO2  06/23/12 0706 140/76 mmHg 98.6 F (37 C) - 88  18  95 %  07/15/2012 2125 131/62 mmHg 100.2 F (37.9 C) - 95  18  96 %  2012/07/15 1503 128/70 mmHg 99.9 F (37.7 C) Oral 91  18  97 %  Jul 15, 2012 1200 - - - - 18  99 %       Recent laboratory studies:  Basename 06/23/12 0500 15-Jul-2012 0540 06/21/12 0448  WBC 7.4 8.5 7.4  HGB 10.5* 10.9* 11.0*  HCT 32.1* 33.5* 33.3*  PLT 186 181 186      Basename 15-Jul-2012 0540 06/21/12 0448  NA 138 135  K 3.7 4.4  CL 100 102  CO2 30 26  BUN 9 11  CREATININE 0.78 0.77  GLUCOSE 175* 122*  CALCIUM 8.6 8.5   Lab Results  Component Value Date   INR 0.95 06/13/2012     Recent Radiographic Studies :  Dg Chest 2 View  06/20/2012  *RADIOLOGY REPORT*  Clinical Data: Preoperative respiratory exam for knee replacement.  CHEST - 2 VIEW  Comparison: 10/14/2009  Findings: Heart size is normal.  Mediastinal shadows are normal. The lungs are clear.  Prominent epicardial fat is noted on the lateral view.  No effusions.  Chronic degenerative changes effect the spine.  IMPRESSION: No active disease   Original Report Authenticated By: Paulina Fusi, M.D.     DISCHARGE INSTRUCTIONS:   DISCHARGE MEDICATIONS:     Medication List     As of 06/23/2012  8:31 AM    TAKE these medications         cyclobenzaprine 10 MG tablet   Commonly known as: FLEXERIL   Take 10 mg by mouth daily as needed. For pain      enoxaparin 30 MG/0.3ML injection   Commonly known as: LOVENOX   Inject 0.3 mLs (30 mg total) into the skin every 12 (twelve) hours.      furosemide 40 MG tablet   Commonly known as: LASIX   Take 40 mg by mouth daily.      gabapentin 600 MG tablet   Commonly known as: NEURONTIN   Take 600 mg by mouth 4 (four) times daily.      levothyroxine 150 MCG tablet   Commonly known as: SYNTHROID, LEVOTHROID   Take 150 mcg by mouth daily.      lisinopril 10 MG tablet   Commonly known as: PRINIVIL,ZESTRIL   Take 10 mg by mouth daily.      methadone 10 MG tablet   Commonly known as: DOLOPHINE   Take 10 mg by mouth 2 (two) times daily.      metoprolol tartrate 25 MG tablet   Commonly known as: LOPRESSOR   Take 25 mg by mouth 2 (two) times daily.      nitroGLYCERIN 0.4 MG SL tablet   Commonly known as: NITROSTAT   Place 0.4 mg under the tongue every 5 (five) minutes as needed. For chest pain      oxyCODONE 5 MG immediate release tablet  Commonly known as: Oxy IR/ROXICODONE   1-2 tabs po q4-6hrs prn pain      pantoprazole 40 MG tablet   Commonly known as: PROTONIX   Take 40 mg by mouth daily.      potassium chloride 10 MEQ tablet   Commonly known as: K-DUR   Take 10 mEq by mouth daily.      simvastatin 40 MG tablet   Commonly known as: ZOCOR   Take 40 mg by mouth daily.        FOLLOW UP VISIT:       Follow-up Information    Schedule an appointment as soon as possible for a visit with CAFFREY JR,W D, MD. (on 07/07/12)    Contact information:   [address] [place] [address] [phone]          DISPOSITION:  Home with HHPT  CONDITION:  {Stable  Margart Sickles 06/23/2012, 8:31 AM

## 2012-06-23 NOTE — Progress Notes (Signed)
Physical Therapy Treatment Patient Details Name: Selena [name] MRN: [id number] DOB: May 06, 1951 Today's Date: 06/23/2012 Time: 8469-6295 PT Time Calculation (min): 18 min  PT Assessment / Plan / Recommendation Comments on Treatment Session  Patient able to tolerate stair training without issues. Planning for DC this afternoon    Follow Up Recommendations  Home health PT;Supervision/Assistance - 24 hour     Does the patient have the potential to tolerate intense rehabilitation     Barriers to Discharge        Equipment Recommendations  Rolling walker with 5" wheels;3 in 1 bedside comode    Recommendations for Other Services    Frequency 7X/week   Plan Discharge plan remains appropriate;Frequency remains appropriate    Precautions / Restrictions Precautions Precautions: Knee;Fall Precaution Booklet Issued: Yes (comment) Required Braces or Orthoses: Knee Immobilizer - Right Knee Immobilizer - Right: On when out of bed or walking Restrictions RLE Weight Bearing: Weight bearing as tolerated   Pertinent Vitals/Pain     Mobility  Bed Mobility Bed Mobility: Not assessed Supine to Sit: 6: Modified independent (Device/Increase time) Transfers Sit to Stand: 6: Modified independent (Device/Increase time) Stand to Sit: 6: Modified independent (Device/Increase time) Ambulation/Gait Ambulation/Gait Assistance: 6: Modified independent (Device/Increase time) Ambulation Distance (Feet): 200 Feet Assistive device: Rolling walker Ambulation/Gait Assistance Details: No cues required. Supervision for safety Gait Pattern: Step-through pattern;Decreased stride length Stairs: Yes Stairs Assistance: 4: Min assist Stairs Assistance Details (indicate cue type and reason): A for Rw. Cues for technique Stair Management Technique: Step to pattern;Backwards;No rails;With walker Number of Stairs: 2     Exercises Total Joint Exercises Quad Sets: AROM;Right;15 reps Short Arc QuadBarbaraann Boys;Right;10 reps Heel Slides: AAROM;Right;15 reps Hip ABduction/ADduction: AAROM;Right;15 reps Straight Leg Raises: AAROM;Right;10 reps   PT Diagnosis:    PT Problem List:   PT Treatment Interventions:     PT Goals Acute Rehab PT Goals PT Goal: Supine/Side to Sit - Progress: Met PT Goal: Sit to Stand - Progress: Met PT Goal: Stand to Sit - Progress: Met PT Goal: Ambulate - Progress: Met PT Goal: Up/Down Stairs - Progress: Met PT Goal: Perform Home Exercise Program - Progress: Progressing toward goal  Visit Information  Last PT Received On: 06/23/12 Assistance Needed: +1    Subjective Data      Cognition  Overall Cognitive Status: Appears within functional limits for tasks assessed/performed Arousal/Alertness: Awake/alert Orientation Level: Appears intact for tasks assessed Behavior During Session: [facility] for tasks performed    Balance     End of Session PT - End of Session Equipment Utilized During Treatment: Right knee immobilizer Activity Tolerance: Patient tolerated treatment well Patient left: in chair;with call bell/phone within reach Nurse Communication: Mobility status   GP     Fredrich Birks 06/23/2012, 12:15 PM 06/23/2012 Fredrich Birks PTA [phone] pager [phone] office

## 2012-07-07 ENCOUNTER — Other Ambulatory Visit: Payer: Self-pay | Admitting: Cardiology

## 2012-07-07 ENCOUNTER — Encounter (INDEPENDENT_AMBULATORY_CARE_PROVIDER_SITE_OTHER): Payer: BC Managed Care – PPO

## 2012-07-07 DIAGNOSIS — M79609 Pain in unspecified limb: Secondary | ICD-10-CM

## 2012-07-07 DIAGNOSIS — M7989 Other specified soft tissue disorders: Secondary | ICD-10-CM

## 2012-07-14 ENCOUNTER — Ambulatory Visit (HOSPITAL_COMMUNITY): Payer: BC Managed Care – PPO | Admitting: Physical Therapy

## 2012-07-17 ENCOUNTER — Ambulatory Visit (HOSPITAL_COMMUNITY): Payer: BC Managed Care – PPO | Admitting: Physical Therapy

## 2012-07-21 ENCOUNTER — Ambulatory Visit (HOSPITAL_COMMUNITY)
Admission: RE | Admit: 2012-07-21 | Payer: BC Managed Care – PPO | Source: Ambulatory Visit | Admitting: Physical Therapy

## 2012-07-24 ENCOUNTER — Ambulatory Visit (HOSPITAL_COMMUNITY)
Admission: RE | Admit: 2012-07-24 | Discharge: 2012-07-24 | Disposition: A | Discharge status: Home / Self Care | Payer: BC Managed Care – PPO | Source: Ambulatory Visit | Attending: Orthopedic Surgery | Admitting: Orthopedic Surgery

## 2012-07-24 DIAGNOSIS — M25569 Pain in unspecified knee: Secondary | ICD-10-CM | POA: Insufficient documentation

## 2012-07-24 DIAGNOSIS — I10 Essential (primary) hypertension: Secondary | ICD-10-CM | POA: Insufficient documentation

## 2012-07-24 DIAGNOSIS — IMO0001 Reserved for inherently not codable concepts without codable children: Secondary | ICD-10-CM | POA: Insufficient documentation

## 2012-07-24 DIAGNOSIS — M6281 Muscle weakness (generalized): Secondary | ICD-10-CM | POA: Insufficient documentation

## 2012-07-24 DIAGNOSIS — R262 Difficulty in walking, not elsewhere classified: Secondary | ICD-10-CM | POA: Insufficient documentation

## 2012-07-24 DIAGNOSIS — M25669 Stiffness of unspecified knee, not elsewhere classified: Secondary | ICD-10-CM | POA: Insufficient documentation

## 2012-07-24 NOTE — Evaluation (Signed)
Physical Therapy Evaluation  Patient Details  Name: Selena [name] MRN: [id number] Date of Birth: 1951/06/28  Today's Date: 07/24/2012 Time:1348  - 1430    Visit#: 1  of 8   Re-eval: 08/23/12 Assessment Diagnosis: TKR Surgical Date: 06/20/12 Next MD Visit: 08/10/2011 Prior Therapy: HH  Authorization: BCBS  Past Medical History:  Past Medical History  Diagnosis Date  . Cervical ca 1984  . Constipation   . Hemorrhoids   . HTN (hypertension)   . Depression     w/ menopause  . Hyperlipemia   . Hypothyroidism   . Tachycardia     being evaluated recently by Dr. Alanda Amass  . TIA (transient ischemic attack) 1983  . GERD (gastroesophageal reflux disease)   . Bursitis   . Chronic abdominal pain     functional, seen @[facility] , extensive WU here benign   . Skin cancer     nose  . Chronic back pain     on methadone  . PUD (peptic ulcer disease) 1997/1998    negative h pylori  . Abnormal findings on imaging of biliary tract 1997-2000    CBD 21mm, Dr Danny Lawless biliary/pancreatic manometry, EUS Dr. Christella Hartigan 09/18/07 dilated but otherwise normal extrahepatic duct, no masses  . S/P colonoscopy 09/10/07    Dr. Teddy Spike sec to tortuous colon, followed by BE & flex sig normal  . CAD (coronary artery disease)   . MI (myocardial infarction)   . Shortness of breath     with exertion  . Stroke     1983  . H/O hiatal hernia   . Arthritis    Past Surgical History:  Past Surgical History  Procedure Date  . Partial hysterectomy   . Cholecystectomy   . Appendectomy   . Right arm     nerve  . Inguinal hernia repair     left  . Tonsillectomy   . Joint replacement   . Arthroscopic knee   . Total knee arthroplasty 06/20/2012    Procedure: TOTAL KNEE ARTHROPLASTY;  Surgeon: Thera Flake., MD;  Location: [facility] OR;  Service: Orthopedics;  Laterality: Right;    Subjective Symptoms/Limitations Symptoms: Selena [name] states that she opted to have a right total knee replacement on  08/21/2011.  She was discharged home on 11/18 and recieved HH therapy until 07/06/12.  She states that she is still having trouble squatting and has increased pain intermittently. Pertinent History: irregular HB; LBP  How long can you sit comfortably?: Able to sit for an hour but that is due to her back .  How long can you stand comfortably?: Back limits standing not the knee but she can tell that she is putting more wieght on the L LE. How long can you walk comfortably?: The pt is walking with a quad cane slowly and is walking for about 15 minutes. She goes without her cane in her house uses the cane outside of the house. 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 ) Leisure: Hobbies-yes (Comment) Comments: walking  Cognition/Observation Cognition Overall Cognitive Status: Appears within functional limits  for tasks assessed  Sensation/Coordination/Flexibility/Functional Tests Functional Tests Functional Tests: LEFS 35/80  Assessment RLE AROM (degrees) Right Knee Extension: 23  Right Knee Flexion: 115  RLE Strength Right Hip Flexion: 5/5 Right Hip Extension: 3-/5 Right Hip ABduction: 5/5 Right Knee Flexion: 4/5 Right Knee Extension: 4/5 Right Ankle Dorsiflexion: 3+/5  Exercise/Treatments   Stretches Active Hamstring Stretch: 3 reps;30 seconds   Seated Long Arc Quad: 10 reps Supine Quad Sets: 10 reps Terminal Knee Extension: 10 reps Prone  Hamstring Curl: 10 reps Hip Extension: 10 reps     Physical Therapy Assessment and Plan PT Assessment and Plan Clinical Impression Statement: Pt s/p R TKR who's primary problem is lack of extension followec by extensor strength who will benefit from skilled PT to improve ROM, Strength and return pt to prior functinal level Pt will benefit from skilled therapeutic intervention in order to improve on the following deficits: Decreased balance;Decreased range of motion;Decreased strength;Difficulty walking;Pain Rehab Potential: Good PT Frequency: Min 2X/week PT Duration: 4 weeks PT Treatment/Interventions: Gait training;Stair training;Patient/family education;Therapeutic exercise;Manual techniques;Modalities;Therapeutic activities PT Plan: begin bike, rockerboard, heel raises, standing knee flex, terminal extension both standing and supine and PROM next treatment.    Goals Home Exercise Program Pt will Perform Home Exercise Program: Independently PT Short Term Goals Time to Complete Short Term Goals: 2 weeks PT Short Term Goal 1: ROM to be 15 -120 to allow more normalized walking PT Short Term Goal 2: Pt to be walking for 20 minutes PT Short Term Goal 3: Pain no greater than a 5 80 % of the day PT Long Term Goals Time to Complete Long Term Goals: 4 weeks PT Long Term Goal 1: I in advance HEP PT Long Term Goal 2: strength  5/5 to allow return to squatting position Long Term Goal 3: Rom 5 degree to allow normalize walking and squatting activities Long Term Goal 4: Pain no greater than a 2 80% of the day PT Long Term Goal 5: LEFS increased at least 10 levels. Additional PT Long Term Goals?: Yes PT Long Term Goal 6: Ambulate inside and ourside without a cane PT Long Term Goal 7: able to go up 6 steps reciprocally without a hand rail to be able to get into front door.  Problem List Patient Active Problem List  Diagnosis  . Dysphagia  . Abnormal finding of biliary tract  . GERD (gastroesophageal reflux disease)  . Knee pain  . Abnormality  of gait  . Muscle weakness (generalized)  . HTN (hypertension)  . Depression  . Hyperlipemia  . Hypothyroidism  . Chronic back pain  . PUD (peptic ulcer disease)  . CAD (coronary artery disease)  . DJD (degenerative joint disease) of knee, end stage R knee  . Stiffness of joint, not elsewhere classified, lower leg  . Difficulty in walking    PT - End of Session Activity Tolerance: Patient tolerated treatment well General Behavior During Session: [facility] for tasks performed Cognition: [facility] for tasks performed PT Plan of Care PT Home Exercise Plan: given  GP    RUSSELL,CINDY 07/24/2012, 2:47 PM  Physician Documentation Your signature is required to indicate approval of the treatment plan as stated above.  Please sign and either send electronically or make a copy of this report for your files and return this physician signed original.   Please mark one 1.__approve of plan  2. ___approve of plan with the following conditions.   ______________________________  _____________________ Physician Signature                                                                                                             Date

## 2012-07-31 ENCOUNTER — Ambulatory Visit (HOSPITAL_COMMUNITY): Payer: BC Managed Care – PPO | Admitting: *Deleted

## 2012-08-01 ENCOUNTER — Ambulatory Visit (HOSPITAL_COMMUNITY): Payer: BC Managed Care – PPO | Admitting: Physical Therapy

## 2012-08-05 ENCOUNTER — Inpatient Hospital Stay (HOSPITAL_COMMUNITY)
Admission: RE | Admit: 2012-08-05 | Payer: BC Managed Care – PPO | Source: Ambulatory Visit | Admitting: Physical Therapy

## 2012-08-05 ENCOUNTER — Telehealth (HOSPITAL_COMMUNITY): Payer: Self-pay | Admitting: Physical Therapy

## 2012-08-07 ENCOUNTER — Ambulatory Visit (HOSPITAL_COMMUNITY): Payer: BC Managed Care – PPO | Admitting: Physical Therapy

## 2012-08-12 ENCOUNTER — Ambulatory Visit (HOSPITAL_COMMUNITY)
Admission: RE | Admit: 2012-08-12 | Discharge: 2012-08-12 | Disposition: A | Discharge status: Home / Self Care | Payer: BC Managed Care – PPO | Source: Ambulatory Visit | Attending: Orthopedic Surgery | Admitting: Orthopedic Surgery

## 2012-08-12 DIAGNOSIS — IMO0001 Reserved for inherently not codable concepts without codable children: Secondary | ICD-10-CM | POA: Insufficient documentation

## 2012-08-12 DIAGNOSIS — R262 Difficulty in walking, not elsewhere classified: Secondary | ICD-10-CM | POA: Insufficient documentation

## 2012-08-12 DIAGNOSIS — I10 Essential (primary) hypertension: Secondary | ICD-10-CM | POA: Insufficient documentation

## 2012-08-12 DIAGNOSIS — M6281 Muscle weakness (generalized): Secondary | ICD-10-CM | POA: Insufficient documentation

## 2012-08-12 DIAGNOSIS — M25569 Pain in unspecified knee: Secondary | ICD-10-CM | POA: Insufficient documentation

## 2012-08-12 DIAGNOSIS — M25669 Stiffness of unspecified knee, not elsewhere classified: Secondary | ICD-10-CM | POA: Insufficient documentation

## 2012-08-12 NOTE — Progress Notes (Signed)
Physical Therapy Treatment Patient Details  Name: Selena [name] MRN: [id number] Date of Birth: [birth year]  Today's Date: 08/12/2012 Time: 1350-1435 PT Time Calculation (min): 45 min  Visit#: 2  of 8   Re-eval: 08/23/12 Charges: Therex x 30' Ice x 10'  Authorization: BCBS    Subjective: Symptoms/Limitations Symptoms: Pt states that she is not having any knee pain. Only low back pain. Pain Assessment Currently in Pain?: Yes Pain Score:   8 Pain Location: Back Pain Orientation: Lower   Exercise/Treatments Aerobic Stationary Bike: 8'@2 .0 for ROm and strength Standing Heel Raises: 10 reps;Limitations Heel Raises Limitations: Toe raises x 10 Knee Flexion: 10 reps Terminal Knee Extension: 10 reps;Theraband Theraband Level (Terminal Knee Extension): Level 4 (Blue) Functional Squat: 10 reps Seated Long Arc Quad: 15 reps Supine Short Arc Quad Sets: 10 reps Heel Slides: 10 reps Knee Extension: PROM Knee Flexion: PROM   Modalities Modalities: Cryotherapy Cryotherapy Number Minutes Cryotherapy: 10 Minutes Cryotherapy Location: Knee (Right) Type of Cryotherapy: Ice pack  Physical Therapy Assessment and Plan PT Assessment and Plan Clinical Impression Statement: Pt completes therex well after initial cueing and demonstration. Pt requires vc's for control and speed with exercises. Pt appears to be progressing well with strength and ROM. Ice applied to R knee at end of session to limits pain and swelling.  PT Plan: Continue to progress strength and ROM per PT POC. Begin step up next session.     Problem List Patient Active Problem List  Diagnosis  . Dysphagia  . Abnormal finding of biliary tract  . GERD (gastroesophageal reflux disease)  . Knee pain  . Abnormality of gait  . Muscle weakness (generalized)  . HTN (hypertension)  . Depression  . Hyperlipemia  . Hypothyroidism  . Chronic back pain  . PUD (peptic ulcer disease)  . CAD (coronary artery disease)  . DJD  (degenerative joint disease) of knee, end stage R knee  . Stiffness of joint, not elsewhere classified, lower leg  . Difficulty in walking    PT - End of Session Activity Tolerance: Patient tolerated treatment well General Behavior During Session: [facility] for tasks performed Cognition: [facility] for tasks performed  Seth Bake, PTA 08/12/2012, 3:46 PM

## 2012-08-14 ENCOUNTER — Ambulatory Visit (HOSPITAL_COMMUNITY): Payer: BC Managed Care – PPO | Admitting: Physical Therapy

## 2012-08-19 ENCOUNTER — Ambulatory Visit (HOSPITAL_COMMUNITY): Payer: BC Managed Care – PPO | Admitting: *Deleted

## 2012-08-21 ENCOUNTER — Ambulatory Visit (HOSPITAL_COMMUNITY): Payer: BC Managed Care – PPO | Admitting: Physical Therapy

## 2012-10-29 ENCOUNTER — Other Ambulatory Visit (HOSPITAL_COMMUNITY): Payer: Self-pay | Admitting: Physical Medicine and Rehabilitation

## 2012-10-29 DIAGNOSIS — M48061 Spinal stenosis, lumbar region without neurogenic claudication: Secondary | ICD-10-CM

## 2012-10-30 ENCOUNTER — Ambulatory Visit (HOSPITAL_COMMUNITY)
Admission: RE | Admit: 2012-10-30 | Discharge: 2012-10-30 | Disposition: A | Discharge status: Home / Self Care | Payer: BC Managed Care – PPO | Source: Ambulatory Visit | Attending: Physical Medicine and Rehabilitation | Admitting: Physical Medicine and Rehabilitation

## 2012-10-30 DIAGNOSIS — M48061 Spinal stenosis, lumbar region without neurogenic claudication: Secondary | ICD-10-CM | POA: Insufficient documentation

## 2012-10-30 DIAGNOSIS — M545 Low back pain, unspecified: Secondary | ICD-10-CM | POA: Insufficient documentation

## 2013-01-10 DIAGNOSIS — G4700 Insomnia, unspecified: Secondary | ICD-10-CM | POA: Diagnosis present

## 2013-01-10 DIAGNOSIS — M47817 Spondylosis without myelopathy or radiculopathy, lumbosacral region: Secondary | ICD-10-CM | POA: Insufficient documentation

## 2013-03-26 ENCOUNTER — Other Ambulatory Visit (HOSPITAL_COMMUNITY): Payer: Self-pay | Admitting: Internal Medicine

## 2013-03-26 ENCOUNTER — Other Ambulatory Visit (HOSPITAL_COMMUNITY): Payer: Self-pay | Admitting: Obstetrics & Gynecology

## 2013-03-26 DIAGNOSIS — Z139 Encounter for screening, unspecified: Secondary | ICD-10-CM

## 2013-03-30 ENCOUNTER — Ambulatory Visit (HOSPITAL_COMMUNITY)
Admission: RE | Admit: 2013-03-30 | Discharge: 2013-03-30 | Disposition: A | Discharge status: Home / Self Care | Payer: BC Managed Care – PPO | Source: Ambulatory Visit | Attending: Internal Medicine | Admitting: Internal Medicine

## 2013-03-30 DIAGNOSIS — Z139 Encounter for screening, unspecified: Secondary | ICD-10-CM

## 2013-03-30 DIAGNOSIS — Z1231 Encounter for screening mammogram for malignant neoplasm of breast: Secondary | ICD-10-CM | POA: Insufficient documentation

## 2013-06-15 ENCOUNTER — Ambulatory Visit (HOSPITAL_COMMUNITY)
Admission: RE | Admit: 2013-06-15 | Discharge: 2013-06-15 | Disposition: A | Discharge status: Home / Self Care | Payer: BC Managed Care – PPO | Source: Ambulatory Visit | Attending: Internal Medicine | Admitting: Internal Medicine

## 2013-06-15 ENCOUNTER — Other Ambulatory Visit (HOSPITAL_COMMUNITY): Payer: Self-pay | Admitting: Internal Medicine

## 2013-06-15 DIAGNOSIS — R05 Cough: Secondary | ICD-10-CM | POA: Insufficient documentation

## 2013-06-15 DIAGNOSIS — R062 Wheezing: Secondary | ICD-10-CM

## 2013-06-15 DIAGNOSIS — R0602 Shortness of breath: Secondary | ICD-10-CM | POA: Insufficient documentation

## 2013-06-15 DIAGNOSIS — R059 Cough, unspecified: Secondary | ICD-10-CM | POA: Insufficient documentation

## 2013-08-31 ENCOUNTER — Telehealth (HOSPITAL_COMMUNITY): Payer: Self-pay

## 2013-09-01 ENCOUNTER — Ambulatory Visit (HOSPITAL_COMMUNITY): Payer: BC Managed Care – PPO | Admitting: Physical Therapy

## 2014-03-16 ENCOUNTER — Encounter: Payer: Self-pay | Admitting: Internal Medicine

## 2014-03-23 ENCOUNTER — Encounter (HOSPITAL_COMMUNITY): Payer: Self-pay | Admitting: Pharmacy Technician

## 2014-03-23 ENCOUNTER — Ambulatory Visit: Payer: Self-pay | Admitting: Physician Assistant

## 2014-03-23 NOTE — H&P (Signed)
TOTAL KNEE ADMISSION H&P  Patient is being admitted for left total knee arthroplasty.  Subjective:  Chief Complaint:left knee pain.  HPI: Selena [name], 63 y.o. female, has a history of pain and functional disability in the left knee due to arthritis and has failed non-surgical conservative treatments for greater than 12 weeks to includeNSAID's and/or analgesics, corticosteriod injections, use of assistive devices and activity modification.  Onset of symptoms was gradual, starting 6 years ago with gradually worsening course since that time. The patient noted prior procedures on the knee to include  arthroscopy and menisectomy on the left knee(s).  Patient currently rates pain in the left knee(s) at 10 out of 10 with activity. Patient has night pain, worsening of pain with activity and weight bearing, pain that interferes with activities of daily living, pain with passive range of motion, crepitus and joint swelling.  Patient has evidence of periarticular osteophytes and joint space narrowing by imaging studies.There is no active infection.  Patient Active Problem List   Diagnosis Date Noted  . Stiffness of joint, not elsewhere classified, lower leg 07/24/2012  . Difficulty in walking 07/24/2012  . DJD (degenerative joint disease) of knee, end stage R knee 06/21/2012  . HTN (hypertension)   . Depression   . Hyperlipemia   . Hypothyroidism   . Chronic back pain   . PUD (peptic ulcer disease)   . CAD (coronary artery disease)   . Knee pain 01/07/2012  . Abnormality of gait 01/07/2012  . Muscle weakness (generalized) 01/07/2012  . Dysphagia 12/13/2010  . Abnormal finding of biliary tract 12/13/2010  . GERD (gastroesophageal reflux disease) 12/13/2010   Past Medical History  Diagnosis Date  . Cervical ca 1984  . Constipation   . Hemorrhoids   . HTN (hypertension)   . Depression     w/ menopause  . Hyperlipemia   . Hypothyroidism   . Tachycardia     being evaluated recently by Dr.  Rollene Fare  . TIA (transient ischemic attack) 1983  . GERD (gastroesophageal reflux disease)   . Bursitis   . Chronic abdominal pain     functional, seen @[facility] , extensive WU here benign   . Skin cancer     nose  . Chronic back pain     on methadone  . PUD (peptic ulcer disease) 1997/1998    negative h pylori  . Abnormal findings on imaging of biliary tract 1997-2000    CBD 54mm, Dr Albin Felling biliary/pancreatic manometry, EUS Dr. Ardis Hughs 09/18/07 dilated but otherwise normal extrahepatic duct, no masses  . S/P colonoscopy 09/10/07    Dr. Donald Pore sec to tortuous colon, followed by [facility] & flex sig normal  . CAD (coronary artery disease)   . MI (myocardial infarction)   . Shortness of breath     with exertion  . Stroke     1983  . H/O hiatal hernia   . Arthritis     Past Surgical History  Procedure Laterality Date  . Partial hysterectomy    . Cholecystectomy    . Appendectomy    . Right arm      nerve  . Inguinal hernia repair      left  . Tonsillectomy    . Joint replacement    . Arthroscopic knee    . Total knee arthroplasty  06/20/2012    Procedure: TOTAL KNEE ARTHROPLASTY;  Surgeon: Yvette Rack., MD;  Location: [facility];  Service: Orthopedics;  Laterality: Right;     (Not in  a hospital admission) Allergies  Allergen Reactions  . Iohexol Swelling     Desc: Pt states she had Cardia Cath around 2000/ experienced SOB and swelling of upper body during cath procedure/ tsf   . Nubain [Nalbuphine Hcl] Other (See Comments)    Difficulty breathing  . Latex Rash  . Penicillins Itching and Rash    History  Substance Use Topics  . Smoking status: Former Smoker -- 1.00 packs/day for 22 years    Types: Cigarettes    Quit date: 01/05/2004  . Smokeless tobacco: Not on file  . Alcohol Use: No    Family History  Problem Relation Age of Onset  . Ulcers Brother   . Ulcers Sister      Review of Systems  Constitutional: Negative.   Skin: Negative.      Objective:  Physical Exam  Vital signs in last 24 hours: @VSRANGES @  Labs:   Estimated body mass index is 34.18 kg/(m^2) as calculated from the following:   Height as of 06/13/12: 5\' 7"  (1.702 m).   Weight as of 06/13/12: 99.02 kg (218 lb 4.8 oz).   Imaging Review Plain radiographs demonstrate severe degenerative joint disease of the left knee(s). The overall alignment ismild varus. The bone quality appears to be good for age and reported activity level.  Assessment/Plan:  End stage arthritis, left knee   The patient history, physical examination, clinical judgment of the provider and imaging studies are consistent with end stage degenerative joint disease of the left knee(s) and total knee arthroplasty is deemed medically necessary. The treatment options including medical management, injection therapy arthroscopy and arthroplasty were discussed at length. The risks and benefits of total knee arthroplasty were presented and reviewed. The risks due to aseptic loosening, infection, stiffness, patella tracking problems, thromboembolic complications and other imponderables were discussed. The patient acknowledged the explanation, agreed to proceed with the plan and consent was signed. Patient is being admitted for inpatient treatment for surgery, pain control, PT, OT, prophylactic antibiotics, VTE prophylaxis, progressive ambulation and ADL's and discharge planning. The patient is planning to be discharged home with home health services

## 2014-03-24 ENCOUNTER — Encounter (HOSPITAL_COMMUNITY)
Admission: RE | Admit: 2014-03-24 | Discharge: 2014-03-24 | Disposition: A | Discharge status: Home / Self Care | Payer: BC Managed Care – PPO | Source: Ambulatory Visit | Attending: Orthopedic Surgery | Admitting: Orthopedic Surgery

## 2014-03-24 ENCOUNTER — Encounter (HOSPITAL_COMMUNITY): Payer: Self-pay

## 2014-03-24 DIAGNOSIS — M1710 Unilateral primary osteoarthritis, unspecified knee: Secondary | ICD-10-CM | POA: Insufficient documentation

## 2014-03-24 DIAGNOSIS — Z01818 Encounter for other preprocedural examination: Secondary | ICD-10-CM | POA: Insufficient documentation

## 2014-03-24 HISTORY — DX: Adverse effect of unspecified anesthetic, initial encounter: T41.45XA

## 2014-03-24 LAB — CBC WITH DIFFERENTIAL/PLATELET
BASOS PCT: 0 % (ref 0–1)
Basophils Absolute: 0 10*3/uL (ref 0.0–0.1)
Eosinophils Absolute: 0.4 10*3/uL (ref 0.0–0.7)
Eosinophils Relative: 5 % (ref 0–5)
HCT: 41.7 % (ref 36.0–46.0)
Hemoglobin: 13.5 g/dL (ref 12.0–15.0)
Lymphocytes Relative: 40 % (ref 12–46)
Lymphs Abs: 3 10*3/uL (ref 0.7–4.0)
MCH: 27.7 pg (ref 26.0–34.0)
MCHC: 32.4 g/dL (ref 30.0–36.0)
MCV: 85.5 fL (ref 78.0–100.0)
MONO ABS: 0.8 10*3/uL (ref 0.1–1.0)
Monocytes Relative: 10 % (ref 3–12)
NEUTROS ABS: 3.4 10*3/uL (ref 1.7–7.7)
NEUTROS PCT: 45 % (ref 43–77)
Platelets: 270 10*3/uL (ref 150–400)
RBC: 4.88 MIL/uL (ref 3.87–5.11)
RDW: 14.7 % (ref 11.5–15.5)
WBC: 7.5 10*3/uL (ref 4.0–10.5)

## 2014-03-24 LAB — URINALYSIS, ROUTINE W REFLEX MICROSCOPIC
Bilirubin Urine: NEGATIVE
Glucose, UA: NEGATIVE mg/dL
Hgb urine dipstick: NEGATIVE
KETONES UR: NEGATIVE mg/dL
NITRITE: NEGATIVE
Protein, ur: NEGATIVE mg/dL
SPECIFIC GRAVITY, URINE: 1.016 (ref 1.005–1.030)
Urobilinogen, UA: 0.2 mg/dL (ref 0.0–1.0)
pH: 5 (ref 5.0–8.0)

## 2014-03-24 LAB — COMPREHENSIVE METABOLIC PANEL
ALBUMIN: 4.1 g/dL (ref 3.5–5.2)
ALT: 34 U/L (ref 0–35)
AST: 39 U/L — AB (ref 0–37)
Alkaline Phosphatase: 87 U/L (ref 39–117)
Anion gap: 15 (ref 5–15)
BILIRUBIN TOTAL: 0.4 mg/dL (ref 0.3–1.2)
BUN: 17 mg/dL (ref 6–23)
CHLORIDE: 99 meq/L (ref 96–112)
CO2: 27 mEq/L (ref 19–32)
CREATININE: 0.93 mg/dL (ref 0.50–1.10)
Calcium: 10 mg/dL (ref 8.4–10.5)
GFR calc Af Amer: 75 mL/min — ABNORMAL LOW (ref >90)
GFR calc non Af Amer: 65 mL/min — ABNORMAL LOW (ref >90)
Glucose, Bld: 103 mg/dL — ABNORMAL HIGH (ref 70–99)
POTASSIUM: 4.4 meq/L (ref 3.7–5.3)
Sodium: 141 mEq/L (ref 137–147)
Total Protein: 7.4 g/dL (ref 6.0–8.3)

## 2014-03-24 LAB — TYPE AND SCREEN
ABO/RH(D): B POS
Antibody Screen: NEGATIVE

## 2014-03-24 LAB — URINE MICROSCOPIC-ADD ON

## 2014-03-24 LAB — SURGICAL PCR SCREEN
MRSA, PCR: NEGATIVE
STAPHYLOCOCCUS AUREUS: NEGATIVE

## 2014-03-24 LAB — PROTIME-INR
INR: 0.96 (ref 0.00–1.49)
Prothrombin Time: 12.8 seconds (ref 11.6–15.2)

## 2014-03-24 LAB — APTT: APTT: 28 s (ref 24–37)

## 2014-03-24 MED ORDER — CHLORHEXIDINE GLUCONATE 4 % EX LIQD: 60.0000 mL | Freq: Once | CUTANEOUS | Status: DC

## 2014-03-24 NOTE — Pre-Procedure Instructions (Signed)
Selena [name]  03/24/2014   Your procedure is scheduled on: Friday, April 02, 2014  Report to [facility] Admitting at 5:30 AM.  Call this number if you have problems the morning of surgery: [phone]   Remember:   Do not eat food or drink liquids after midnight Thursday, April 01, 2014   Take these medicines the morning of surgery with A SIP OF WATER: gabapentin (NEURONTIN),  levothyroxine (SYNTHROID),methadone (DOLOPHINE), metoprolol tartrate (LOPRESSOR), pantoprazole (PROTONIX), Fluticasone-Salmeterol (ADVAIR), if needed:nitroGLYCERIN (NITROSTAT) for chest pain, oxyCODONE-( percocet) for severe pain, VISINE for dry eyes Stop taking Aspirin, multivitamins, and herbal medications. Do not take any NSAIDs ie: Ibuprofen, Advil, Naproxen or any medication containing Aspirin; stop Friday,  03/26/14.   Do not wear jewelry, make-up or nail polish.  Do not wear lotions, powders, or perfumes. You may wear deodorant.  Do not shave 48 hours prior to surgery.   Do not bring valuables to the hospital.  [facility] is not responsible for any belongings or valuables.               Contacts, dentures or bridgework may not be worn into surgery.  Leave suitcase in the car. After surgery it may be brought to your room.  For patients admitted to the hospital, discharge time is determined by your treatment team.               Patients discharged the day of surgery will not be allowed to drive home.  Name and phone number of your driver:  Special Instructions:  Special Instructions:Special Instructions: [facility] - Preparing for Surgery  Before surgery, you can play an important role.  Because skin is not sterile, your skin needs to be as free of germs as possible.  You can reduce the number of germs on you skin by washing with CHG (chlorahexidine gluconate) soap before surgery.  CHG is an antiseptic cleaner which kills germs and bonds with the skin to continue killing germs even after  washing.  Please DO NOT use if you have an allergy to CHG or antibacterial soaps.  If your skin becomes reddened/irritated stop using the CHG and inform your nurse when you arrive at Short Stay.  Do not shave (including legs and underarms) for at least 48 hours prior to the first CHG shower.  You may shave your face.  Please follow these instructions carefully:   1.  Shower with CHG Soap the night before surgery and the morning of Surgery.  2.  If you choose to wash your hair, wash your hair first as usual with your normal shampoo.  3.  After you shampoo, rinse your hair and body thoroughly to remove the Shampoo.  4.  Use CHG as you would any other liquid soap.  You can apply chg directly  to the skin and wash gently with scrungie or a clean washcloth.  5.  Apply the CHG Soap to your body ONLY FROM THE NECK DOWN.  Do not use on open wounds or open sores.  Avoid contact with your eyes, ears, mouth and genitals (private parts).  Wash genitals (private parts) with your normal soap.  6.  Wash thoroughly, paying special attention to the area where your surgery will be performed.  7.  Thoroughly rinse your body with warm water from the neck down.  8.  DO NOT shower/wash with your normal soap after using and rinsing off the CHG Soap.  9.  Pat yourself dry with a clean  towel.            10.  Wear clean pajamas.            11.  Place clean sheets on your bed the night of your first shower and do not sleep with pets.  Day of Surgery  Do not apply any lotions the morning of surgery.  Please wear clean clothes to the hospital/surgery center.   Please read over the following fact sheets that you were given: Pain Booklet, Coughing and Deep Breathing, Blood Transfusion Information, MRSA Information and Surgical Site Infection Prevention

## 2014-03-25 LAB — URINE CULTURE
COLONY COUNT: NO GROWTH
Culture: NO GROWTH

## 2014-04-01 MED ORDER — CLINDAMYCIN PHOSPHATE 900 MG/50ML IV SOLN
900.0000 mg | INTRAVENOUS | Status: AC
  Administered 2014-04-02: 900 mg via INTRAVENOUS
  Filled 2014-04-01: qty 50

## 2014-04-01 MED ORDER — ACETAMINOPHEN 500 MG PO TABS
1000.0000 mg | ORAL_TABLET | Freq: Once | ORAL | Status: DC
  Filled 2014-04-01: qty 2

## 2014-04-02 ENCOUNTER — Inpatient Hospital Stay (HOSPITAL_COMMUNITY): Payer: BC Managed Care – PPO | Admitting: Anesthesiology

## 2014-04-02 ENCOUNTER — Encounter (HOSPITAL_COMMUNITY): Payer: Self-pay | Admitting: *Deleted

## 2014-04-02 ENCOUNTER — Inpatient Hospital Stay (HOSPITAL_COMMUNITY)
Admission: RE | Admit: 2014-04-02 | Discharge: 2014-04-04 | DRG: 470 | Disposition: A | Discharge status: Home Health | Payer: BC Managed Care – PPO | Source: Ambulatory Visit | Attending: Orthopedic Surgery | Admitting: Orthopedic Surgery

## 2014-04-02 ENCOUNTER — Encounter (HOSPITAL_COMMUNITY)
Admission: RE | Disposition: A | Discharge status: Home Health | Payer: Self-pay | Source: Ambulatory Visit | Attending: Orthopedic Surgery

## 2014-04-02 ENCOUNTER — Encounter (HOSPITAL_COMMUNITY): Payer: BC Managed Care – PPO | Admitting: Anesthesiology

## 2014-04-02 DIAGNOSIS — M1710 Unilateral primary osteoarthritis, unspecified knee: Principal | ICD-10-CM | POA: Diagnosis present

## 2014-04-02 DIAGNOSIS — I10 Essential (primary) hypertension: Secondary | ICD-10-CM | POA: Diagnosis present

## 2014-04-02 DIAGNOSIS — I2510 Atherosclerotic heart disease of native coronary artery without angina pectoris: Secondary | ICD-10-CM | POA: Diagnosis present

## 2014-04-02 DIAGNOSIS — Z7901 Long term (current) use of anticoagulants: Secondary | ICD-10-CM

## 2014-04-02 DIAGNOSIS — M549 Dorsalgia, unspecified: Secondary | ICD-10-CM | POA: Diagnosis present

## 2014-04-02 DIAGNOSIS — G8929 Other chronic pain: Secondary | ICD-10-CM | POA: Diagnosis present

## 2014-04-02 DIAGNOSIS — Z79899 Other long term (current) drug therapy: Secondary | ICD-10-CM

## 2014-04-02 DIAGNOSIS — K219 Gastro-esophageal reflux disease without esophagitis: Secondary | ICD-10-CM | POA: Diagnosis present

## 2014-04-02 DIAGNOSIS — Z85828 Personal history of other malignant neoplasm of skin: Secondary | ICD-10-CM

## 2014-04-02 DIAGNOSIS — Z87891 Personal history of nicotine dependence: Secondary | ICD-10-CM

## 2014-04-02 DIAGNOSIS — I252 Old myocardial infarction: Secondary | ICD-10-CM

## 2014-04-02 DIAGNOSIS — M25569 Pain in unspecified knee: Secondary | ICD-10-CM | POA: Diagnosis present

## 2014-04-02 DIAGNOSIS — M1712 Unilateral primary osteoarthritis, left knee: Secondary | ICD-10-CM | POA: Diagnosis present

## 2014-04-02 DIAGNOSIS — Z8711 Personal history of peptic ulcer disease: Secondary | ICD-10-CM | POA: Diagnosis not present

## 2014-04-02 DIAGNOSIS — Z8673 Personal history of transient ischemic attack (TIA), and cerebral infarction without residual deficits: Secondary | ICD-10-CM

## 2014-04-02 DIAGNOSIS — E785 Hyperlipidemia, unspecified: Secondary | ICD-10-CM | POA: Diagnosis present

## 2014-04-02 DIAGNOSIS — E039 Hypothyroidism, unspecified: Secondary | ICD-10-CM | POA: Diagnosis present

## 2014-04-02 HISTORY — PX: TOTAL KNEE ARTHROPLASTY: SHX125

## 2014-04-02 SURGERY — ARTHROPLASTY, KNEE, TOTAL
Anesthesia: Regional | Laterality: Left

## 2014-04-02 MED ORDER — SIMVASTATIN 40 MG PO TABS
40.0000 mg | ORAL_TABLET | Freq: Every day | ORAL | Status: DC
  Administered 2014-04-03 – 2014-04-04 (×2): 40 mg via ORAL
  Filled 2014-04-02 (×2): qty 1

## 2014-04-02 MED ORDER — ONDANSETRON HCL 4 MG PO TABS: 4.0000 mg | ORAL_TABLET | Freq: Four times a day (QID) | ORAL | Status: DC | PRN

## 2014-04-02 MED ORDER — METOPROLOL TARTRATE 25 MG PO TABS
25.0000 mg | ORAL_TABLET | Freq: Two times a day (BID) | ORAL | Status: DC
  Administered 2014-04-02 – 2014-04-04 (×4): 25 mg via ORAL
  Filled 2014-04-02 (×5): qty 1

## 2014-04-02 MED ORDER — HYDROMORPHONE HCL PF 1 MG/ML IJ SOLN
INTRAMUSCULAR | Status: AC
  Administered 2014-04-02: 0.5 mg via INTRAVENOUS
  Filled 2014-04-02: qty 1

## 2014-04-02 MED ORDER — MIDAZOLAM HCL 2 MG/2ML IJ SOLN
INTRAMUSCULAR | Status: AC
  Filled 2014-04-02: qty 2

## 2014-04-02 MED ORDER — SODIUM CHLORIDE 0.9 % IV SOLN
INTRAVENOUS | Status: DC
  Administered 2014-04-02: 13:00:00 via INTRAVENOUS

## 2014-04-02 MED ORDER — 0.9 % SODIUM CHLORIDE (POUR BTL) OPTIME
TOPICAL | Status: DC | PRN
  Administered 2014-04-02: 1000 mL

## 2014-04-02 MED ORDER — OXYCODONE HCL 5 MG PO TABS
ORAL_TABLET | ORAL | Status: AC
  Filled 2014-04-02: qty 2

## 2014-04-02 MED ORDER — LOSARTAN POTASSIUM 50 MG PO TABS
100.0000 mg | ORAL_TABLET | Freq: Every day | ORAL | Status: DC
  Administered 2014-04-02 – 2014-04-04 (×3): 100 mg via ORAL
  Filled 2014-04-02 (×3): qty 2

## 2014-04-02 MED ORDER — CLINDAMYCIN PHOSPHATE 600 MG/50ML IV SOLN
600.0000 mg | Freq: Four times a day (QID) | INTRAVENOUS | Status: AC
  Administered 2014-04-02 (×2): 600 mg via INTRAVENOUS
  Filled 2014-04-02 (×2): qty 50

## 2014-04-02 MED ORDER — POTASSIUM CHLORIDE ER 10 MEQ PO TBCR
10.0000 meq | EXTENDED_RELEASE_TABLET | Freq: Two times a day (BID) | ORAL | Status: DC
  Administered 2014-04-02 – 2014-04-04 (×4): 10 meq via ORAL
  Filled 2014-04-02 (×5): qty 1

## 2014-04-02 MED ORDER — METOCLOPRAMIDE HCL 5 MG/ML IJ SOLN: 5.0000 mg | Freq: Three times a day (TID) | INTRAMUSCULAR | Status: DC | PRN

## 2014-04-02 MED ORDER — LIDOCAINE HCL (CARDIAC) 20 MG/ML IV SOLN
INTRAVENOUS | Status: AC
  Filled 2014-04-02: qty 5

## 2014-04-02 MED ORDER — OXYCODONE HCL 5 MG PO TABS
5.0000 mg | ORAL_TABLET | ORAL | Status: DC | PRN
  Administered 2014-04-02: 10 mg via ORAL

## 2014-04-02 MED ORDER — PHENOL 1.4 % MT LIQD: 1.0000 | OROMUCOSAL | Status: DC | PRN

## 2014-04-02 MED ORDER — CHLORHEXIDINE GLUCONATE 4 % EX LIQD
60.0000 mL | Freq: Once | CUTANEOUS | Status: DC
Start: 2014-04-02 — End: 2014-04-02
  Filled 2014-04-02: qty 60

## 2014-04-02 MED ORDER — DIPHENHYDRAMINE HCL 12.5 MG/5ML PO ELIX
12.5000 mg | ORAL_SOLUTION | ORAL | Status: DC | PRN
  Administered 2014-04-03: 25 mg via ORAL
  Filled 2014-04-02: qty 10

## 2014-04-02 MED ORDER — SODIUM CHLORIDE 0.9 % IR SOLN
Status: DC | PRN
  Administered 2014-04-02: 1000 mL

## 2014-04-02 MED ORDER — ROCURONIUM BROMIDE 50 MG/5ML IV SOLN
INTRAVENOUS | Status: AC
  Filled 2014-04-02: qty 1

## 2014-04-02 MED ORDER — NITROGLYCERIN 0.4 MG SL SUBL: 0.4000 mg | SUBLINGUAL_TABLET | SUBLINGUAL | Status: DC | PRN

## 2014-04-02 MED ORDER — SENNOSIDES-DOCUSATE SODIUM 8.6-50 MG PO TABS: 1.0000 | ORAL_TABLET | Freq: Every evening | ORAL | Status: DC | PRN

## 2014-04-02 MED ORDER — DOCUSATE SODIUM 100 MG PO CAPS
100.0000 mg | ORAL_CAPSULE | Freq: Two times a day (BID) | ORAL | Status: DC
  Administered 2014-04-02 – 2014-04-04 (×5): 100 mg via ORAL
  Filled 2014-04-02 (×5): qty 1

## 2014-04-02 MED ORDER — MENTHOL 3 MG MT LOZG: 1.0000 | LOZENGE | OROMUCOSAL | Status: DC | PRN

## 2014-04-02 MED ORDER — ACETAMINOPHEN 325 MG PO TABS
650.0000 mg | ORAL_TABLET | Freq: Four times a day (QID) | ORAL | Status: DC | PRN
  Administered 2014-04-02 – 2014-04-03 (×3): 650 mg via ORAL
  Filled 2014-04-02 (×3): qty 2

## 2014-04-02 MED ORDER — MOMETASONE FURO-FORMOTEROL FUM 100-5 MCG/ACT IN AERO
2.0000 | INHALATION_SPRAY | Freq: Two times a day (BID) | RESPIRATORY_TRACT | Status: DC
Start: 2014-04-02 — End: 2014-04-04
  Administered 2014-04-02 – 2014-04-04 (×3): 2 via RESPIRATORY_TRACT
  Filled 2014-04-02: qty 8.8

## 2014-04-02 MED ORDER — PROPOFOL 10 MG/ML IV BOLUS
INTRAVENOUS | Status: DC | PRN
  Administered 2014-04-02: 100 mg via INTRAVENOUS

## 2014-04-02 MED ORDER — RIVAROXABAN 10 MG PO TABS
10.0000 mg | ORAL_TABLET | Freq: Every day | ORAL | Status: DC
  Administered 2014-04-03 – 2014-04-04 (×2): 10 mg via ORAL
  Filled 2014-04-02 (×3): qty 1

## 2014-04-02 MED ORDER — ACETAMINOPHEN 650 MG RE SUPP: 650.0000 mg | Freq: Four times a day (QID) | RECTAL | Status: DC | PRN

## 2014-04-02 MED ORDER — OXYCODONE HCL 5 MG PO TABS: 5.0000 mg | ORAL_TABLET | Freq: Once | ORAL | Status: DC | PRN

## 2014-04-02 MED ORDER — PROPOFOL 10 MG/ML IV BOLUS
INTRAVENOUS | Status: AC
  Filled 2014-04-02: qty 20

## 2014-04-02 MED ORDER — METHADONE HCL 10 MG PO TABS
10.0000 mg | ORAL_TABLET | Freq: Two times a day (BID) | ORAL | Status: DC
  Administered 2014-04-02 – 2014-04-04 (×4): 10 mg via ORAL
  Filled 2014-04-02 (×4): qty 1

## 2014-04-02 MED ORDER — METOCLOPRAMIDE HCL 10 MG PO TABS: 5.0000 mg | ORAL_TABLET | Freq: Three times a day (TID) | ORAL | Status: DC | PRN

## 2014-04-02 MED ORDER — PHENYLEPHRINE HCL 10 MG/ML IJ SOLN
INTRAMUSCULAR | Status: DC | PRN
  Administered 2014-04-02: 80 ug via INTRAVENOUS

## 2014-04-02 MED ORDER — LEVOTHYROXINE SODIUM 137 MCG PO TABS
137.0000 ug | ORAL_TABLET | Freq: Every day | ORAL | Status: DC
  Administered 2014-04-03 – 2014-04-04 (×2): 137 ug via ORAL
  Filled 2014-04-02 (×3): qty 1

## 2014-04-02 MED ORDER — ONDANSETRON HCL 4 MG/2ML IJ SOLN
INTRAMUSCULAR | Status: DC | PRN
  Administered 2014-04-02: 4 mg via INTRAVENOUS

## 2014-04-02 MED ORDER — BISACODYL 10 MG RE SUPP: 10.0000 mg | Freq: Every day | RECTAL | Status: DC | PRN

## 2014-04-02 MED ORDER — CYCLOBENZAPRINE HCL 10 MG PO TABS
10.0000 mg | ORAL_TABLET | Freq: Two times a day (BID) | ORAL | Status: DC | PRN
  Administered 2014-04-03 (×2): 10 mg via ORAL
  Filled 2014-04-02 (×3): qty 1

## 2014-04-02 MED ORDER — ROPIVACAINE HCL 5 MG/ML IJ SOLN
INTRAMUSCULAR | Status: DC | PRN
  Administered 2014-04-02: 40 mL via EPIDURAL
  Administered 2014-04-02: 30 mL via PERINEURAL

## 2014-04-02 MED ORDER — FLEET ENEMA 7-19 GM/118ML RE ENEM: 1.0000 | ENEMA | Freq: Once | RECTAL | Status: AC | PRN

## 2014-04-02 MED ORDER — PHENYLEPHRINE 40 MCG/ML (10ML) SYRINGE FOR IV PUSH (FOR BLOOD PRESSURE SUPPORT)
PREFILLED_SYRINGE | INTRAVENOUS | Status: AC
  Filled 2014-04-02: qty 10

## 2014-04-02 MED ORDER — SODIUM CHLORIDE 0.9 % IV SOLN: INTRAVENOUS | Status: DC

## 2014-04-02 MED ORDER — FENTANYL CITRATE 0.05 MG/ML IJ SOLN
INTRAMUSCULAR | Status: AC
  Filled 2014-04-02: qty 5

## 2014-04-02 MED ORDER — ONDANSETRON HCL 4 MG/2ML IJ SOLN
4.0000 mg | Freq: Four times a day (QID) | INTRAMUSCULAR | Status: DC | PRN
  Administered 2014-04-02: 4 mg via INTRAVENOUS
  Filled 2014-04-02: qty 2

## 2014-04-02 MED ORDER — PANTOPRAZOLE SODIUM 40 MG PO TBEC
40.0000 mg | DELAYED_RELEASE_TABLET | Freq: Every day | ORAL | Status: DC
  Administered 2014-04-03 – 2014-04-04 (×2): 40 mg via ORAL
  Filled 2014-04-02 (×2): qty 1

## 2014-04-02 MED ORDER — GABAPENTIN 600 MG PO TABS
600.0000 mg | ORAL_TABLET | Freq: Two times a day (BID) | ORAL | Status: DC
  Administered 2014-04-02 – 2014-04-04 (×4): 600 mg via ORAL
  Filled 2014-04-02 (×5): qty 1

## 2014-04-02 MED ORDER — LACTATED RINGERS IV SOLN
INTRAVENOUS | Status: DC | PRN
  Administered 2014-04-02 (×2): via INTRAVENOUS

## 2014-04-02 MED ORDER — OXYCODONE HCL ER 10 MG PO T12A
10.0000 mg | EXTENDED_RELEASE_TABLET | Freq: Two times a day (BID) | ORAL | Status: DC
  Administered 2014-04-02 – 2014-04-04 (×4): 10 mg via ORAL
  Filled 2014-04-02 (×4): qty 1

## 2014-04-02 MED ORDER — FUROSEMIDE 40 MG PO TABS
40.0000 mg | ORAL_TABLET | Freq: Every day | ORAL | Status: DC
  Administered 2014-04-02 – 2014-04-04 (×2): 40 mg via ORAL
  Filled 2014-04-02 (×3): qty 1

## 2014-04-02 MED ORDER — HYDROMORPHONE HCL PF 1 MG/ML IJ SOLN
0.2500 mg | INTRAMUSCULAR | Status: DC | PRN
  Administered 2014-04-02 (×4): 0.5 mg via INTRAVENOUS

## 2014-04-02 MED ORDER — MIDAZOLAM HCL 5 MG/5ML IJ SOLN
INTRAMUSCULAR | Status: DC | PRN
  Administered 2014-04-02: 2 mg via INTRAVENOUS

## 2014-04-02 MED ORDER — OXYCODONE HCL 5 MG/5ML PO SOLN: 5.0000 mg | Freq: Once | ORAL | Status: DC | PRN

## 2014-04-02 MED ORDER — FENTANYL CITRATE 0.05 MG/ML IJ SOLN
INTRAMUSCULAR | Status: DC | PRN
  Administered 2014-04-02 (×2): 50 ug via INTRAVENOUS

## 2014-04-02 MED ORDER — HYDROMORPHONE HCL PF 1 MG/ML IJ SOLN
1.0000 mg | INTRAMUSCULAR | Status: DC | PRN
  Administered 2014-04-02: 2 mg via INTRAVENOUS
  Administered 2014-04-02: 1 mg via INTRAVENOUS
  Administered 2014-04-02: 2 mg via INTRAVENOUS
  Administered 2014-04-03 (×3): 1 mg via INTRAVENOUS
  Administered 2014-04-03: 2 mg via INTRAVENOUS
  Administered 2014-04-03 – 2014-04-04 (×3): 1 mg via INTRAVENOUS
  Filled 2014-04-02 (×3): qty 1
  Filled 2014-04-02: qty 2
  Filled 2014-04-02: qty 1
  Filled 2014-04-02: qty 2
  Filled 2014-04-02 (×2): qty 1
  Filled 2014-04-02: qty 2
  Filled 2014-04-02: qty 1

## 2014-04-02 SURGICAL SUPPLY — 70 items
BANDAGE ELASTIC 4 VELCRO ST LF (GAUZE/BANDAGES/DRESSINGS) ×3 IMPLANT
BANDAGE ELASTIC 6 VELCRO ST LF (GAUZE/BANDAGES/DRESSINGS) ×3 IMPLANT
BANDAGE ESMARK 6X9 LF (GAUZE/BANDAGES/DRESSINGS) ×1 IMPLANT
BLADE SAGITTAL 25.0X1.19X90 (BLADE) ×2 IMPLANT
BLADE SAGITTAL 25.0X1.19X90MM (BLADE) ×1
BLADE SAW SAG 90X13X1.27 (BLADE) ×3 IMPLANT
BNDG CMPR 9X6 STRL LF SNTH (GAUZE/BANDAGES/DRESSINGS) ×1
BNDG ESMARK 6X9 LF (GAUZE/BANDAGES/DRESSINGS) ×3
BONE CEMENT GENTAMICIN (Cement) ×6 IMPLANT
BOWL SMART MIX CTS (DISPOSABLE) ×3 IMPLANT
CAPT RP KNEE ×2 IMPLANT
CEMENT BONE GENTAMICIN 40 (Cement) IMPLANT
COVER SURGICAL LIGHT HANDLE (MISCELLANEOUS) ×3 IMPLANT
CUFF TOURNIQUET SINGLE 34IN LL (TOURNIQUET CUFF) ×3 IMPLANT
CUFF TOURNIQUET SINGLE 44IN (TOURNIQUET CUFF) IMPLANT
DRAPE INCISE IOBAN 66X45 STRL (DRAPES) ×4 IMPLANT
DRAPE ORTHO SPLIT 77X108 STRL (DRAPES) ×6
DRAPE SURG ORHT 6 SPLT 77X108 (DRAPES) ×2 IMPLANT
DRAPE U-SHAPE 47X51 STRL (DRAPES) ×3 IMPLANT
DRSG ADAPTIC 3X8 NADH LF (GAUZE/BANDAGES/DRESSINGS) ×3 IMPLANT
DRSG PAD ABDOMINAL 8X10 ST (GAUZE/BANDAGES/DRESSINGS) ×4 IMPLANT
DURAPREP 26ML APPLICATOR (WOUND CARE) ×3 IMPLANT
ELECT REM PT RETURN 9FT ADLT (ELECTROSURGICAL) ×3
ELECTRODE REM PT RTRN 9FT ADLT (ELECTROSURGICAL) ×1 IMPLANT
EVACUATOR 1/8 PVC DRAIN (DRAIN) ×3 IMPLANT
FACESHIELD WRAPAROUND (MASK) ×3 IMPLANT
FACESHIELD WRAPAROUND OR TEAM (MASK) ×2 IMPLANT
FLOSEAL 10ML (HEMOSTASIS) IMPLANT
GAUZE SPONGE 4X4 12PLY STRL (GAUZE/BANDAGES/DRESSINGS) ×1 IMPLANT
GLOVE BIOGEL PI IND STRL 8 (GLOVE) ×4 IMPLANT
GLOVE BIOGEL PI INDICATOR 8 (GLOVE) ×4
GLOVE ORTHO TXT STRL SZ7.5 (GLOVE) ×3 IMPLANT
GLOVE SURG ORTHO 8.0 STRL STRW (GLOVE) ×3 IMPLANT
GLOVE SURG SS PI 7.5 STRL IVOR (GLOVE) ×4 IMPLANT
GLOVE SURG SS PI 8.0 STRL IVOR (GLOVE) ×4 IMPLANT
GOWN STRL REUS W/ TWL LRG LVL3 (GOWN DISPOSABLE) ×2 IMPLANT
GOWN STRL REUS W/ TWL XL LVL3 (GOWN DISPOSABLE) ×1 IMPLANT
GOWN STRL REUS W/TWL 2XL LVL3 (GOWN DISPOSABLE) ×3 IMPLANT
GOWN STRL REUS W/TWL LRG LVL3 (GOWN DISPOSABLE) ×6
GOWN STRL REUS W/TWL XL LVL3 (GOWN DISPOSABLE) ×3
HANDPIECE INTERPULSE COAX TIP (DISPOSABLE) ×3
HOOD PEEL AWAY FACE SHEILD DIS (HOOD) ×3 IMPLANT
IMMOBILIZER KNEE 22 (SOFTGOODS) ×2 IMPLANT
IMMOBILIZER KNEE 22 UNIV (SOFTGOODS) IMPLANT
KIT BASIN OR (CUSTOM PROCEDURE TRAY) ×3 IMPLANT
KIT ROOM TURNOVER OR (KITS) ×3 IMPLANT
MANIFOLD NEPTUNE II (INSTRUMENTS) ×3 IMPLANT
NEEDLE 22X1 1/2 (OR ONLY) (NEEDLE) IMPLANT
NS IRRIG 1000ML POUR BTL (IV SOLUTION) ×3 IMPLANT
PACK TOTAL JOINT (CUSTOM PROCEDURE TRAY) ×3 IMPLANT
PAD ARMBOARD 7.5X6 YLW CONV (MISCELLANEOUS) ×6 IMPLANT
PAD CAST 4YDX4 CTTN HI CHSV (CAST SUPPLIES) ×1 IMPLANT
PADDING CAST COTTON 4X4 STRL (CAST SUPPLIES) ×3
PADDING CAST COTTON 6X4 STRL (CAST SUPPLIES) ×3 IMPLANT
SET HNDPC FAN SPRY TIP SCT (DISPOSABLE) ×1 IMPLANT
SPONGE GAUZE 4X4 12PLY STER LF (GAUZE/BANDAGES/DRESSINGS) ×2 IMPLANT
STAPLER VISISTAT 35W (STAPLE) ×3 IMPLANT
SUCTION FRAZIER TIP 10 FR DISP (SUCTIONS) ×3 IMPLANT
SUT ETHIBOND NAB CT1 #1 30IN (SUTURE) ×9 IMPLANT
SUT VIC AB 0 CT1 27 (SUTURE) ×3
SUT VIC AB 0 CT1 27XBRD ANBCTR (SUTURE) ×1 IMPLANT
SUT VIC AB 1 CT1 27 (SUTURE)
SUT VIC AB 1 CT1 27XBRD ANBCTR (SUTURE) ×2 IMPLANT
SUT VIC AB 2-0 CT1 27 (SUTURE) ×6
SUT VIC AB 2-0 CT1 TAPERPNT 27 (SUTURE) ×2 IMPLANT
SYR CONTROL 10ML LL (SYRINGE) IMPLANT
TOWEL OR 17X24 6PK STRL BLUE (TOWEL DISPOSABLE) ×3 IMPLANT
TOWEL OR 17X26 10 PK STRL BLUE (TOWEL DISPOSABLE) ×3 IMPLANT
TRAY FOLEY CATH 16FRSI W/METER (SET/KITS/TRAYS/PACK) IMPLANT
WATER STERILE IRR 1000ML POUR (IV SOLUTION) ×2 IMPLANT

## 2014-04-02 NOTE — Evaluation (Signed)
Physical Therapy Evaluation Patient Details Name: ANALEISE MCCLEERY MRN: [id number] DOB: 1951-04-20 Today's Date: 04/02/2014   History of Present Illness  Patient presents s/p left TKA.  PMH of R TKA 2013, back pain, GERD, PUD, Cervical CA and CAD.  Clinical Impression  Patient presents with decreased mobility due to deficits listed in PT problem list.  She will benefit from skilled PT in the acute setting to allow return home with family assist and HHPT.    Follow Up Recommendations Home health PT;Supervision/Assistance - 24 hour    Equipment Recommendations  None recommended by PT    Recommendations for Other Services       Precautions / Restrictions Precautions Precautions: Fall Required Braces or Orthoses: Knee Immobilizer - Left Knee Immobilizer - Left: On when out of bed or walking Restrictions Weight Bearing Restrictions: Yes LLE Weight Bearing: Weight bearing as tolerated      Mobility  Bed Mobility Overal bed mobility: Needs Assistance Bed Mobility: Supine to Sit     Supine to sit: Min assist;HOB elevated     General bed mobility comments: assist for left LE, use or rails to sit up  Transfers Overall transfer level: Needs assistance   Transfers: Sit to/from Stand Sit to Stand: Min assist         General transfer comment: cues for hand placement for safety  Ambulation/Gait Ambulation/Gait assistance: Min assist Ambulation Distance (Feet): 12 Feet (x 2) Assistive device: Rolling walker (2 wheeled) Gait Pattern/deviations: Step-to pattern;Shuffle;Decreased stride length;Trunk flexed     General Gait Details: left knee buckles with weight, cues for use of walker when stepping on left  Stairs            Wheelchair Mobility    Modified Rankin (Stroke Patients Only)       Balance Overall balance assessment: Needs assistance         Standing balance support: Bilateral upper extremity supported Standing balance-Leahy Scale: Poor Standing  balance comment: needs UE support and min assist for balance                             Pertinent Vitals/Pain Pain Assessment: 0-10 Pain Score: 10-Worst pain ever Pain Location: left knee Pain Descriptors / Indicators: Jabbing Pain Intervention(s): Limited activity within patient's tolerance;Relaxation;Ice applied    Home Living Family/patient expects to be discharged to:: Private residence Living Arrangements: Spouse/significant other Available Help at Discharge: Family;Available 24 hours/day Type of Home: House Home Access: Stairs to enter Entrance Stairs-Rails: None Entrance Stairs-Number of Steps: 3 Home Layout: One level Home Equipment: Cane - single point;Walker - 2 wheels;Shower seat      Prior Function Level of Independence: Independent with assistive device(s)         Comments: used a cane     Hand Dominance        Extremity/Trunk Assessment   Upper Extremity Assessment: Defer to OT evaluation           Lower Extremity Assessment: LLE deficits/detail   LLE Deficits / Details: limited knee AROM approx 20-50 with pain posterior knee with extension, positive quad set, ankle WFL     Communication   Communication: No difficulties  Cognition Arousal/Alertness: Awake/alert Behavior During Therapy: WFL for tasks assessed/performed Overall Cognitive Status: Within Functional Limits for tasks assessed                      General Comments  Exercises Total Joint Exercises Ankle Circles/Pumps: AROM;Both;10 reps;Supine Heel Slides: AAROM;Left;5 reps;Supine      Assessment/Plan    PT Assessment Patient needs continued PT services  PT Diagnosis Difficulty walking;Generalized weakness   PT Problem List    PT Treatment Interventions DME instruction;Gait training;Therapeutic exercise;Stair training;Balance training;Functional mobility training;Therapeutic activities;Patient/family education   PT Goals (Current goals can be found  in the Care Plan section) Acute Rehab PT Goals Patient Stated Goal: To walk without cane PT Goal Formulation: With patient Time For Goal Achievement: 04/09/14 Potential to Achieve Goals: Good    Frequency Min 6X/week   Barriers to discharge        Co-evaluation               End of Session Equipment Utilized During Treatment: Gait belt;Oxygen Activity Tolerance: Patient limited by fatigue (c/o ears buzzing, noted BP 111/58 after return to sitting reclined, HR 115) Patient left: in chair;with call bell/phone within reach;with family/visitor present           Time: 1350-1430 PT Time Calculation (min): 40 min   Charges:   PT Evaluation $Initial PT Evaluation Tier I: 1 Procedure PT Treatments $Gait Training: 8-22 mins   PT G Codes:          WYNN,CYNDI 04/21/2014, 2:43 PM Magda Kiel, [place] 04-21-14

## 2014-04-02 NOTE — Transfer of Care (Signed)
Immediate Anesthesia Transfer of Care Note  Patient: Selena [name]  Procedure(s) Performed: Procedure(s): TOTAL KNEE ARTHROPLASTY (Left)  Patient Location: PACU  Anesthesia Type:General  Level of Consciousness: awake  Airway & Oxygen Therapy: Patient Spontanous Breathing and Patient connected to nasal cannula oxygen  Post-op Assessment: Report given to PACU RN and Post -op Vital signs reviewed and stable  Post vital signs: Reviewed and stable  Complications: No apparent anesthesia complications

## 2014-04-02 NOTE — Care Management Note (Signed)
CARE MANAGEMENT NOTE 04/02/2014  Patient:  Selena [name], Selena [name]   Account Number:  [id number]  Date Initiated:  04/02/2014  Documentation initiated by:  Ricki Miller  Subjective/Objective Assessment:   63 yr old female admitted with osteoarthritis of left knee. S/p left total knee arthroplasty.     Action/Plan:   PT/OT eval.  Patient preoperatively setup with [place].   Anticipated DC Date:  04/04/2014   Anticipated DC Plan:  [place]  CM consult      Choice offered to / List presented to:          [facility] arranged  [place].   Status of service:  In process, will continue to follow  Per UR Regulation:  Reviewed for med. necessity/level of care/duration of stay

## 2014-04-02 NOTE — Progress Notes (Signed)
Utilization review completed.  

## 2014-04-02 NOTE — Anesthesia Preprocedure Evaluation (Addendum)
Anesthesia Evaluation  Patient identified by MRN, date of birth, ID band Patient awake    Reviewed: Allergy & Precautions, H&P , NPO status , Patient's Chart, lab work & pertinent test results, reviewed documented beta blocker date and time   Airway Mallampati: II TM Distance: >3 FB Neck ROM: Full    Dental no notable dental hx. (+) Partial Lower, Partial Upper, Dental Advisory Given   Pulmonary neg pulmonary ROS, former smoker,  breath sounds clear to auscultation  Pulmonary exam normal       Cardiovascular hypertension, Pt. on medications and Pt. on home beta blockers + CAD and + Past MI Rhythm:Regular Rate:Normal     Neuro/Psych Depression TIA   GI/Hepatic Neg liver ROS, hiatal hernia, PUD, GERD-  Medicated and Controlled,  Endo/Other  Hypothyroidism Morbid obesity  Renal/GU negative Renal ROS  negative genitourinary   Musculoskeletal   Abdominal   Peds  Hematology negative hematology ROS (+)   Anesthesia Other Findings   Reproductive/Obstetrics negative OB ROS                           Anesthesia Physical Anesthesia Plan  ASA: III  Anesthesia Plan: General and Regional   Post-op Pain Management:    Induction: Intravenous  Airway Management Planned: LMA  Additional Equipment:   Intra-op Plan:   Post-operative Plan: Extubation in OR  Informed Consent: I have reviewed the patients History and Physical, chart, labs and discussed the procedure including the risks, benefits and alternatives for the proposed anesthesia with the patient or authorized representative who has indicated his/her understanding and acceptance.   Dental advisory given  Plan Discussed with: CRNA  Anesthesia Plan Comments:         Anesthesia Quick Evaluation

## 2014-04-02 NOTE — Anesthesia Procedure Notes (Addendum)
Anesthesia Regional Block:  Femoral nerve block  Pre-Anesthetic Checklist: ,, timeout performed, Correct Patient, Correct Site, Correct Laterality, Correct Procedure, Correct Position, site marked, Risks and benefits discussed, pre-op evaluation,  At surgeon's request and post-op pain management  Laterality: Left  Prep: Maximum Sterile Barrier Precautions used and chloraprep       Needles:  Injection technique: Single-shot  Needle Type: Echogenic Stimulator Needle     Needle Length: 5cm 5 cm Needle Gauge: 22 and 22 G    Additional Needles:  Procedures: ultrasound guided (picture in chart) and nerve stimulator Femoral nerve block  Nerve Stimulator or Paresthesia:  Response: Patellar respose,   Additional Responses:   Narrative:  Start time: 04/02/2014 7:00 AM End time: 04/02/2014 7:10 AM Injection made incrementally with aspirations every 5 mL. Anesthesiologist: Stacie Knutzen,MD  Additional Notes: 2% Lidocaine skin wheel.

## 2014-04-02 NOTE — Brief Op Note (Signed)
04/02/2014  10:03 AM  PATIENT:  Loreen Freud  63 y.o. female  PRE-OPERATIVE DIAGNOSIS:  OA LEFT KNEE  POST-OPERATIVE DIAGNOSIS:  OA LEFT KNEE  PROCEDURE:  Procedure(s): TOTAL KNEE ARTHROPLASTY (Left)  SURGEON:  Surgeon(s) and Role:    * W D Valeta Harms., MD - Primary  PHYSICIAN ASSISTANT: Chriss Czar, PA-C  ASSISTANTS:   ANESTHESIA:   regional and general  EBL:     BLOOD ADMINISTERED:none  DRAINS: 1 hemovac drain lateral left knee self suction   LOCAL MEDICATIONS USED:  NONE  SPECIMEN:  No Specimen  DISPOSITION OF SPECIMEN:  N/A  COUNTS:  YES  TOURNIQUET:   Total Tourniquet Time Documented: Thigh (Left) - 15909 minutes Total: Thigh (Left) - 15909 minutes   DICTATION: .Other Dictation: Dictation Number unknown  PLAN OF CARE: Admit to inpatient   PATIENT DISPOSITION:  PACU - hemodynamically stable.   Delay start of Pharmacological VTE agent (>24hrs) due to surgical blood loss or risk of bleeding: yes

## 2014-04-02 NOTE — Progress Notes (Signed)
Orthopedic Tech Progress Note Patient Details:  Selena [name] Jul 28, 1951 [id number]  CPM Left Knee CPM Left Knee: On Left Knee Flexion (Degrees): 90 Left Knee Extension (Degrees): 0 Additional Comments: Trapeze bar and foot roll   Irish Elders 04/02/2014, 11:13 AM

## 2014-04-02 NOTE — H&P (View-Only) (Signed)
TOTAL KNEE ADMISSION H&P  Patient is being admitted for left total knee arthroplasty.  Subjective:  Chief Complaint:left knee pain.  HPI: Selena [name], 63 y.o. female, has a history of pain and functional disability in the left knee due to arthritis and has failed non-surgical conservative treatments for greater than 12 weeks to includeNSAID's and/or analgesics, corticosteriod injections, use of assistive devices and activity modification.  Onset of symptoms was gradual, starting 6 years ago with gradually worsening course since that time. The patient noted prior procedures on the knee to include  arthroscopy and menisectomy on the left knee(s).  Patient currently rates pain in the left knee(s) at 10 out of 10 with activity. Patient has night pain, worsening of pain with activity and weight bearing, pain that interferes with activities of daily living, pain with passive range of motion, crepitus and joint swelling.  Patient has evidence of periarticular osteophytes and joint space narrowing by imaging studies.There is no active infection.  Patient Active Problem List   Diagnosis Date Noted  . Stiffness of joint, not elsewhere classified, lower leg 07/24/2012  . Difficulty in walking 07/24/2012  . DJD (degenerative joint disease) of knee, end stage R knee 06/21/2012  . HTN (hypertension)   . Depression   . Hyperlipemia   . Hypothyroidism   . Chronic back pain   . PUD (peptic ulcer disease)   . CAD (coronary artery disease)   . Knee pain 01/07/2012  . Abnormality of gait 01/07/2012  . Muscle weakness (generalized) 01/07/2012  . Dysphagia 12/13/2010  . Abnormal finding of biliary tract 12/13/2010  . GERD (gastroesophageal reflux disease) 12/13/2010   Past Medical History  Diagnosis Date  . Cervical ca 1984  . Constipation   . Hemorrhoids   . HTN (hypertension)   . Depression     w/ menopause  . Hyperlipemia   . Hypothyroidism   . Tachycardia     being evaluated recently by Dr.  Rollene Fare  . TIA (transient ischemic attack) 1983  . GERD (gastroesophageal reflux disease)   . Bursitis   . Chronic abdominal pain     functional, seen @[facility] , extensive WU here benign   . Skin cancer     nose  . Chronic back pain     on methadone  . PUD (peptic ulcer disease) 1997/1998    negative h pylori  . Abnormal findings on imaging of biliary tract 1997-2000    CBD 60mm, Dr Albin Felling biliary/pancreatic manometry, EUS Dr. Ardis Hughs 09/18/07 dilated but otherwise normal extrahepatic duct, no masses  . S/P colonoscopy 09/10/07    Dr. Donald Pore sec to tortuous colon, followed by BE & flex sig normal  . CAD (coronary artery disease)   . MI (myocardial infarction)   . Shortness of breath     with exertion  . Stroke     1983  . H/O hiatal hernia   . Arthritis     Past Surgical History  Procedure Laterality Date  . Partial hysterectomy    . Cholecystectomy    . Appendectomy    . Right arm      nerve  . Inguinal hernia repair      left  . Tonsillectomy    . Joint replacement    . Arthroscopic knee    . Total knee arthroplasty  06/20/2012    Procedure: TOTAL KNEE ARTHROPLASTY;  Surgeon: Yvette Rack., MD;  Location: [place];  Service: Orthopedics;  Laterality: Right;     (Not in  a hospital admission) Allergies  Allergen Reactions  . Iohexol Swelling     Desc: Pt states she had Cardia Cath around 2000/ experienced SOB and swelling of upper body during cath procedure/ tsf   . Nubain [Nalbuphine Hcl] Other (See Comments)    Difficulty breathing  . Latex Rash  . Penicillins Itching and Rash    History  Substance Use Topics  . Smoking status: Former Smoker -- 1.00 packs/day for 22 years    Types: Cigarettes    Quit date: 01/05/2004  . Smokeless tobacco: Not on file  . Alcohol Use: No    Family History  Problem Relation Age of Onset  . Ulcers Brother   . Ulcers Sister      Review of Systems  Constitutional: Negative.   Skin: Negative.      Objective:  Physical Exam  Vital signs in last 24 hours: @VSRANGES @  Labs:   Estimated body mass index is 34.18 kg/(m^2) as calculated from the following:   Height as of 06/13/12: 5\' 7"  (1.702 m).   Weight as of 06/13/12: 99.02 kg (218 lb 4.8 oz).   Imaging Review Plain radiographs demonstrate severe degenerative joint disease of the left knee(s). The overall alignment ismild varus. The bone quality appears to be good for age and reported activity level.  Assessment/Plan:  End stage arthritis, left knee   The patient history, physical examination, clinical judgment of the provider and imaging studies are consistent with end stage degenerative joint disease of the left knee(s) and total knee arthroplasty is deemed medically necessary. The treatment options including medical management, injection therapy arthroscopy and arthroplasty were discussed at length. The risks and benefits of total knee arthroplasty were presented and reviewed. The risks due to aseptic loosening, infection, stiffness, patella tracking problems, thromboembolic complications and other imponderables were discussed. The patient acknowledged the explanation, agreed to proceed with the plan and consent was signed. Patient is being admitted for inpatient treatment for surgery, pain control, PT, OT, prophylactic antibiotics, VTE prophylaxis, progressive ambulation and ADL's and discharge planning. The patient is planning to be discharged home with home health services

## 2014-04-02 NOTE — Discharge Instructions (Signed)
Diet: As you were doing prior to hospitalization   Activity: Increase activity slowly as tolerated  No lifting or driving for 6 weeks   Shower: May shower without a dressing on post op day #5, NO SOAKING in tub   Dressing: You may change your dressing on post op day #3.  Then change the dressing daily with sterile 4"x4"s gauze dressing  And TED hose for knees. Use paper tape to hold dressing in place  For hips. You may clean the incision with alcohol prior to redressing.   Weight Bearing: weight bearing as taught in physical therapy. Use a walker or  Crutches as instructed.   To prevent constipation: you may use a stool softener such as -  Colace ( over the counter) 100 mg by mouth twice a day  Drink plenty of fluids ( prune juice may be helpful) and high fiber foods  Miralax ( over the counter) for constipation as needed.   Precautions: If you experience chest pain or shortness of breath - call 911 immediately For transfer to the hospital emergency department!!  If you develop a fever greater that 101 F, purulent drainage from wound, increased redness or drainage from wound, or calf pain -- Call the office   Follow- Up Appointment: Please call for an appointment to be seen in 2 weeks  [place] - [phone] _____________________________  Information on my medicine - XARELTO (Rivaroxaban)  This medication education was reviewed with me or my healthcare representative as part of my discharge preparation.  The pharmacist that spoke with me during my hospital stay was:  Lynelle Doctor, [facility]  Why was Xarelto prescribed for you? Xarelto was prescribed for you to reduce the risk of blood clots forming after orthopedic surgery. The medical term for these abnormal blood clots is venous thromboembolism (VTE).  What do you need to know about xarelto ? Take your Xarelto ONCE DAILY at the same time every day. You may take it either with or without food.  If you have difficulty  swallowing the tablet whole, you may crush it and mix in applesauce just prior to taking your dose.  Take Xarelto exactly as prescribed by your doctor and DO NOT stop taking Xarelto without talking to the doctor who prescribed the medication.  Stopping without other VTE prevention medication to take the place of Xarelto may increase your risk of developing a clot.  After discharge, you should have regular check-up appointments with your healthcare provider that is prescribing your Xarelto.    What do you do if you miss a dose? If you miss a dose, take it as soon as you remember on the same day then continue your regularly scheduled once daily regimen the next day. Do not take two doses of Xarelto on the same day.   Important Safety Information A possible side effect of Xarelto is bleeding. You should call your healthcare provider right away if you experience any of the following:   Bleeding from an injury or your nose that does not stop.   Unusual colored urine (red or dark brown) or unusual colored stools (red or black).   Unusual bruising for unknown reasons.   A serious fall or if you hit your head (even if there is no bleeding).  Some medicines may interact with Xarelto and might increase your risk of bleeding while on Xarelto. To help avoid this, consult your healthcare provider or pharmacist prior to using any new prescription or non-prescription medications, including herbals, vitamins, non-steroidal  supplements. ° °This website has more information on Xarelto®: www.xarelto.com. ° ° °

## 2014-04-02 NOTE — Interval H&P Note (Signed)
History and Physical Interval Note:  04/02/2014 7:27 AM  Selena [name]  has presented today for surgery, with the diagnosis of OA LEFT KNEE  The various methods of treatment have been discussed with the patient and family. After consideration of risks, benefits and other options for treatment, the patient has consented to  Procedure(s): TOTAL KNEE ARTHROPLASTY (Left) as a surgical intervention .  The patient's history has been reviewed, patient examined, no change in status, stable for surgery.  I have reviewed the patient's chart and labs.  Questions were answered to the patient's satisfaction.     [name],W D

## 2014-04-02 NOTE — Anesthesia Postprocedure Evaluation (Signed)
  Anesthesia Post-op Note  Patient: Selena [name]  Procedure(s) Performed: Procedure(s): TOTAL KNEE ARTHROPLASTY (Left)  Patient Location: PACU  Anesthesia Type:General and block  Level of Consciousness: awake and alert   Airway and Oxygen Therapy: Patient Spontanous Breathing  Post-op Pain: none  Post-op Assessment: Post-op Vital signs reviewed, Patient's Cardiovascular Status Stable and Respiratory Function Stable  Post-op Vital Signs: Reviewed  Filed Vitals:   04/02/14 1048  BP: 104/60  Pulse:   Temp:   Resp: 17    Complications: No apparent anesthesia complications

## 2014-04-03 LAB — BASIC METABOLIC PANEL
Anion gap: 11 (ref 5–15)
BUN: 10 mg/dL (ref 6–23)
CHLORIDE: 100 meq/L (ref 96–112)
CO2: 29 meq/L (ref 19–32)
Calcium: 8.9 mg/dL (ref 8.4–10.5)
Creatinine, Ser: 0.78 mg/dL (ref 0.50–1.10)
GFR calc Af Amer: >90 mL/min (ref >90)
GFR calc non Af Amer: 88 mL/min — ABNORMAL LOW (ref >90)
GLUCOSE: 135 mg/dL — AB (ref 70–99)
POTASSIUM: 4 meq/L (ref 3.7–5.3)
Sodium: 140 mEq/L (ref 137–147)

## 2014-04-03 LAB — CBC
HEMATOCRIT: 35 % — AB (ref 36.0–46.0)
HEMOGLOBIN: 11.1 g/dL — AB (ref 12.0–15.0)
MCH: 27.4 pg (ref 26.0–34.0)
MCHC: 31.7 g/dL (ref 30.0–36.0)
MCV: 86.4 fL (ref 78.0–100.0)
Platelets: 247 10*3/uL (ref 150–400)
RBC: 4.05 MIL/uL (ref 3.87–5.11)
RDW: 14.9 % (ref 11.5–15.5)
WBC: 9.5 10*3/uL (ref 4.0–10.5)

## 2014-04-03 MED ORDER — WHITE PETROLATUM GEL
Status: AC
  Filled 2014-04-03: qty 5

## 2014-04-03 MED ORDER — OXYCODONE HCL ER 10 MG PO T12A: 10.0000 mg | EXTENDED_RELEASE_TABLET | Freq: Two times a day (BID) | ORAL | Status: DC

## 2014-04-03 MED ORDER — OXYCODONE-ACETAMINOPHEN 5-325 MG PO TABS: 1.0000 | ORAL_TABLET | ORAL | Status: DC | PRN

## 2014-04-03 MED ORDER — RIVAROXABAN 10 MG PO TABS: 10.0000 mg | ORAL_TABLET | Freq: Every day | ORAL | Status: DC

## 2014-04-03 NOTE — Progress Notes (Signed)
OT Cancellation Note  Patient Details Name: Selena [name] MRN: [id number] DOB: [birth year]   Cancelled Treatment:    Reason Eval/Treat Not Completed: OT screened, no needs identified, will sign off. Spoke with pt and she had previous knee surgery. No OT needs.  Benito Mccreedy OTR/L [phone] 04/03/2014, 8:43 AM

## 2014-04-03 NOTE — Discharge Summary (Signed)
PATIENT ID: Selena [name]        MRN:  [id number]          DOB/AGE: [birth year] / 63 y.o.    DISCHARGE SUMMARY  ADMISSION DATE:    04/02/2014 DISCHARGE DATE:   04/04/2014  ADMISSION DIAGNOSIS: OA LEFT KNEE    DISCHARGE DIAGNOSIS:  OA LEFT KNEE    ADDITIONAL DIAGNOSIS: Active Problems:   Osteoarthritis of left knee  Past Medical History  Diagnosis Date  . Cervical ca 1984  . Constipation   . Hemorrhoids   . HTN (hypertension)   . Depression     w/ menopause  . Hyperlipemia   . Hypothyroidism   . Tachycardia     being evaluated recently by Dr. Rollene Fare  . TIA (transient ischemic attack) 1983  . GERD (gastroesophageal reflux disease)   . Bursitis   . Chronic abdominal pain     functional, seen @[facility] , extensive WU here benign   . Skin cancer     nose  . Chronic back pain     on methadone  . PUD (peptic ulcer disease) 1997/1998    negative h pylori  . Abnormal findings on imaging of biliary tract 1997-2000    CBD 38mm, Dr Albin Felling biliary/pancreatic manometry, EUS Dr. Ardis Hughs 09/18/07 dilated but otherwise normal extrahepatic duct, no masses  . S/P colonoscopy 09/10/07    Dr. Donald Pore sec to tortuous colon, followed by BE & flex sig normal  . CAD (coronary artery disease)   . MI (myocardial infarction)   . Shortness of breath     with exertion  . Stroke     1983  . H/O hiatal hernia   . Arthritis   . Complication of anesthesia     years ago woke up during surgery    PROCEDURE: Procedure(s): TOTAL KNEE ARTHROPLASTY Left on 04/02/2014  CONSULTS: none     HISTORY:  See H&P in chart  HOSPITAL COURSE:  Selena [name] is a 63 y.o. admitted on 04/02/2014 and found to have a diagnosis of OA LEFT KNEE.  After appropriate laboratory studies were obtained  they were taken to the operating room on 04/02/2014 and underwent  Procedure(s): TOTAL KNEE ARTHROPLASTY  Left.   They were given perioperative antibiotics:  Anti-infectives   Start     Dose/Rate Route  Frequency Ordered Stop   04/02/14 1330  clindamycin (CLEOCIN) IVPB 600 mg     600 mg 100 mL/hr over 30 Minutes Intravenous Every 6 hours 04/02/14 1207 04/02/14 2331   04/02/14 0600  clindamycin (CLEOCIN) IVPB 900 mg     900 mg 100 mL/hr over 30 Minutes Intravenous On call to O.R. 04/01/14 1406 04/02/14 0730    .  Tolerated the procedure well.    POD #1, allowed out of bed to a chair.  PT for ambulation and exercise program. IV saline locked.  O2 discontionued.  POD #2, continued PT and ambulation.   Hemovac pulled. .  The remainder of the hospital course was dedicated to ambulation and strengthening.   The patient was discharged on 2 days post op in  Stable condition.  Blood products given:none  DIAGNOSTIC STUDIES: Recent vital signs: Patient Vitals for the past 24 hrs:  BP Temp Temp src Pulse Resp SpO2 Height Weight  04/03/14 0602 123/50 mmHg 100.5 F (38.1 C) Oral 129 18 92 % - -  04/03/14 0024 104/71 mmHg 99 F (37.2 C) Oral 110 18 92 % - -  04/02/14 2115  109/64 mmHg 97.6 F (36.4 C) Oral 82 18 92 % - -  04/02/14 1615 110/64 mmHg 97.5 F (36.4 C) - - 18 95 % - -  04/02/14 1300 111/58 mmHg - - 115 - 98 % - -  04/02/14 1215 109/66 mmHg 97.5 F (36.4 C) Oral 60 18 94 % 5\' 6"  (1.676 m) 98.431 kg (217 lb)  04/02/14 1121 113/61 mmHg - - 103 17 94 % - -  04/02/14 1118 96/60 mmHg - - 108 19 99 % - -  04/02/14 1103 107/71 mmHg 97.8 F (36.6 C) - - 22 99 % - -  04/02/14 1048 104/60 mmHg - - - 17 - - -  04/02/14 1033 100/48 mmHg - - 60 20 98 % - -  04/02/14 1018 117/74 mmHg - - 99 14 94 % - -  04/02/14 1005 126/73 mmHg 97.5 F (36.4 C) - 96 12 95 % - -       Recent laboratory studies:  Recent Labs  04/03/14 0730  WBC 9.5  HGB 11.1*  HCT 35.0*  PLT 247    Recent Labs  04/03/14 0730  NA 140  K 4.0  CL 100  CO2 29  BUN 10  CREATININE 0.78  GLUCOSE 135*  CALCIUM 8.9   Lab Results  Component Value Date   INR 0.96 03/24/2014   INR 0.95 06/13/2012     Recent  Radiographic Studies :  No results found.  DISCHARGE INSTRUCTIONS:   DISCHARGE MEDICATIONS:     Medication List         cyclobenzaprine 10 MG tablet  Commonly known as:  FLEXERIL  Take 10 mg by mouth 2 (two) times daily as needed for muscle spasms.     Fluticasone-Salmeterol 250-50 MCG/DOSE Aepb  Commonly known as:  ADVAIR  Inhale 1 puff into the lungs 2 (two) times daily.     furosemide 40 MG tablet  Commonly known as:  LASIX  Take 40 mg by mouth daily.     gabapentin 600 MG tablet  Commonly known as:  NEURONTIN  Take 600 mg by mouth 2 (two) times daily.     levothyroxine 137 MCG tablet  Commonly known as:  SYNTHROID, LEVOTHROID  Take 137 mcg by mouth daily before breakfast.     losartan 100 MG tablet  Commonly known as:  COZAAR  Take 100 mg by mouth daily.     methadone 10 MG tablet  Commonly known as:  DOLOPHINE  Take 10 mg by mouth 2 (two) times daily.     metoprolol tartrate 25 MG tablet  Commonly known as:  LOPRESSOR  Take 25 mg by mouth 2 (two) times daily.     nitroGLYCERIN 0.4 MG SL tablet  Commonly known as:  NITROSTAT  Place 0.4 mg under the tongue every 5 (five) minutes as needed for chest pain.     OxyCODONE 10 mg T12a 12 hr tablet  Commonly known as:  OXYCONTIN  Take 1 tablet (10 mg total) by mouth every 12 (twelve) hours.     oxyCODONE-acetaminophen 5-325 MG per tablet  Commonly known as:  ROXICET  Take 1-2 tablets by mouth every 4 (four) hours as needed for severe pain.     pantoprazole 40 MG tablet  Commonly known as:  PROTONIX  Take 40 mg by mouth daily.     potassium chloride 10 MEQ tablet  Commonly known as:  K-DUR  Take 10 mEq by mouth 2 (two) times daily.  rivaroxaban 10 MG Tabs tablet  Commonly known as:  XARELTO  Take 1 tablet (10 mg total) by mouth daily.     simvastatin 40 MG tablet  Commonly known as:  ZOCOR  Take 40 mg by mouth daily.     triamcinolone cream 0.1 %  Commonly known as:  KENALOG  Apply 1 application  topically 2 (two) times daily. Apply to leg     VISINE OP  Place 1 drop into both eyes at bedtime as needed (dry eyes).        FOLLOW UP VISIT:       Follow-up Information   Follow up with CAFFREY JR,W D, MD. Schedule an appointment as soon as possible for a visit in 2 weeks.   Specialty:  Orthopedic Surgery   Contact information:   [place] [address] [phone]       Follow up with [place]. (Someone from [facility] will contact you concerning start date and time for physical therapy.)    Contact information:   [place] [address] [phone]       DISPOSITION:   Home  CONDITION:  Stable

## 2014-04-03 NOTE — Progress Notes (Signed)
Physical Therapy Treatment Patient Details Name: Selena [name] MRN: [id number] DOB: 1950/11/30 Today's Date: 04/03/2014    History of Present Illness Patient presents s/p left TKA.  PMH of R TKA 2013, back pain, GERD, PUD, Cervical CA and CAD.    PT Comments    Pt agreeable to participate in PT session.  Progressing with mobility/PT goals as she required decreased assistance & increased ambulation distance but cont's to require cues for safety.  Cont with current POC.      Follow Up Recommendations  Home health PT;Supervision/Assistance - 24 hour     Equipment Recommendations  None recommended by PT    Recommendations for Other Services       Precautions / Restrictions Precautions Precautions: Fall Required Braces or Orthoses: Knee Immobilizer - Left Knee Immobilizer - Left: On when out of bed or walking Restrictions LLE Weight Bearing: Weight bearing as tolerated    Mobility  Bed Mobility               General bed mobility comments: pt sitting in recliner upon arrival  Transfers Overall transfer level: Needs assistance Equipment used: Rolling walker (2 wheeled) Transfers: Sit to/from Stand Sit to Stand: Min guard         General transfer comment: cues for hand placement & for safety  Ambulation/Gait Ambulation/Gait assistance: Min guard Ambulation Distance (Feet): 70 Feet Assistive device: Rolling walker (2 wheeled) Gait Pattern/deviations: Step-through pattern;Decreased stride length;Decreased weight shift to left;Antalgic;Decreased step length - right Gait velocity: decreased   General Gait Details: no buckling noted but cues for quad activation during stance phase (even in KI), cues for increased heel strike, increased WBing LLE, & to stay closer to RW.     Stairs            Wheelchair Mobility    Modified Rankin (Stroke Patients Only)       Balance                                    Cognition Arousal/Alertness:  Awake/alert Behavior During Therapy: WFL for tasks assessed/performed Overall Cognitive Status: Within Functional Limits for tasks assessed                      Exercises      General Comments        Pertinent Vitals/Pain Pain Assessment: 0-10 Pain Score: 8  Pain Location: left knee Pain Descriptors / Indicators: Aching;Burning Pain Intervention(s): Limited activity within patient's tolerance;Repositioned;Premedicated before session    Home Living                      Prior Function            PT Goals (current goals can now be found in the care plan section) Acute Rehab PT Goals Patient Stated Goal: To walk without cane PT Goal Formulation: With patient Time For Goal Achievement: 04/09/14 Potential to Achieve Goals: Good Progress towards PT goals: Progressing toward goals    Frequency  Min 6X/week    PT Plan Current plan remains appropriate    Co-evaluation             End of Session Equipment Utilized During Treatment: Left knee immobilizer;Gait belt Activity Tolerance: Patient tolerated treatment well Patient left: in chair;with call bell/phone within reach     Time: 4008-6761 PT Time Calculation (min): 18 min  Charges:  $  Gait Training: 8-22 mins                    G Codes:      Sena Hitch 04/03/2014, 1:37 PM   Sarajane Marek, [place] [phone] 04/03/2014

## 2014-04-03 NOTE — Progress Notes (Signed)
Physical Therapy Treatment Patient Details Name: Selena [name] MRN: [id number] DOB: November 15, 1950 Today's Date: 04/03/2014    History of Present Illness Patient presents s/p left TKA.  PMH of R TKA 2013, back pain, GERD, PUD, Cervical CA and CAD.    PT Comments    Pt currently requires min guard for OOB mobility, cues for safety with RW.      Follow Up Recommendations  Home health PT;Supervision/Assistance - 24 hour     Equipment Recommendations  None recommended by PT    Recommendations for Other Services       Precautions / Restrictions Precautions Precautions: Fall Required Braces or Orthoses: Knee Immobilizer - Left Knee Immobilizer - Left: On when out of bed or walking Restrictions LLE Weight Bearing: Weight bearing as tolerated    Mobility  Bed Mobility Overal bed mobility: Needs Assistance Bed Mobility: Sit to Supine       Sit to supine: Min assist   General bed mobility comments: (A) to lift LLE into bed.    Transfers Overall transfer level: Needs assistance Equipment used: Rolling walker (2 wheeled) Transfers: Sit to/from Stand Sit to Stand: Min guard         General transfer comment: cues to reinforce hand placement  Ambulation/Gait Ambulation/Gait assistance: Min guard Ambulation Distance (Feet): 75 Feet Assistive device: Rolling walker (2 wheeled) Gait Pattern/deviations: Step-to pattern;Decreased weight shift to left;Decreased stance time - right;Decreased step length - left;Antalgic Gait velocity: decreased   General Gait Details: encouragement to increase LLE WBing to allow sufficient step with RLE.  Pt take quick, small step with RLE.  Cues for increased heel strike LLE during stance phase, & cues for safety as pt attempts to walk with only 1 hand on walker.     Stairs            Wheelchair Mobility    Modified Rankin (Stroke Patients Only)       Balance                                    Cognition  Arousal/Alertness: Awake/alert Behavior During Therapy: WFL for tasks assessed/performed Overall Cognitive Status: Within Functional Limits for tasks assessed                      Exercises Total Joint Exercises Ankle Circles/Pumps: AROM;Both;10 reps Quad Sets: AROM;Both;10 reps Heel Slides: AAROM;Left;10 reps Hip ABduction/ADduction: AAROM;Strengthening;Left;10 reps Straight Leg Raises: AAROM;Strengthening;Left;10 reps    General Comments        Pertinent Vitals/Pain Pain Assessment: 0-10 Pain Score: 7  Pain Location: Lt knee Pain Descriptors / Indicators: Aching Pain Intervention(s): Repositioned;Premedicated before session    Home Living                      Prior Function            PT Goals (current goals can now be found in the care plan section) Acute Rehab PT Goals Patient Stated Goal: To walk without cane PT Goal Formulation: With patient Time For Goal Achievement: 04/09/14 Potential to Achieve Goals: Good Progress towards PT goals: Progressing toward goals    Frequency  Min 6X/week    PT Plan Current plan remains appropriate    Co-evaluation             End of Session Equipment Utilized During Treatment: Left knee immobilizer Activity Tolerance: Patient tolerated  treatment well Patient left: in bed;in CPM;with call bell/phone within reach     Time: 1425-1441 PT Time Calculation (min): 16 min  Charges:  $Gait Training: 8-22 mins                    G Codes:      Sena Hitch 04/03/2014, 2:48 PM   Sarajane Marek, PTA [phone] 04/03/2014

## 2014-04-03 NOTE — Progress Notes (Signed)
Subjective: 1 Day Post-Op Procedure(s) (LRB): TOTAL KNEE ARTHROPLASTY (Left) Patient reports pain as mild and moderate.    Objective: Vital signs in last 24 hours: Temp:  [97.5 F (36.4 C)-100.5 F (38.1 C)] 100.5 F (38.1 C) (08/29 0602) Pulse Rate:  [60-129] 129 (08/29 0602) Resp:  [12-22] 18 (08/29 0602) BP: (96-126)/(48-74) 123/50 mmHg (08/29 0602) SpO2:  [92 %-99 %] 92 % (08/29 0602) Weight:  [98.431 kg (217 lb)] 98.431 kg (217 lb) (08/28 1215)  Intake/Output from previous day: 08/28 0701 - 08/29 0700 In: 2480 [P.O.:880; I.V.:1600] Out: 235 [Drains:210; Blood:25] Intake/Output this shift:     Recent Labs  04/03/14 0730  HGB 11.1*    Recent Labs  04/03/14 0730  WBC 9.5  RBC 4.05  HCT 35.0*  PLT 247    Recent Labs  04/03/14 0730  NA 140  K 4.0  CL 100  CO2 29  BUN 10  CREATININE 0.78  GLUCOSE 135*  CALCIUM 8.9   No results found for this basename: LABPT, INR,  in the last 72 hours  Neurovascular intact Sensation intact distally Intact pulses distally Dorsiflexion/Plantar flexion intact Incision: dressing C/D/I  Assessment/Plan: 1 Day Post-Op Procedure(s) (LRB): TOTAL KNEE ARTHROPLASTY (Left) Up with therapy Plan for discharge tomorrow WBAT LLE DVT proph Xarelto Pain Control as ordered   Chriss Czar 04/03/2014, 8:42 AM

## 2014-04-03 NOTE — Op Note (Signed)
Selena [name], Selena [name] NO.:  [id number]  MEDICAL RECORD NO.:  [id number]  LOCATION:  [id number]                        FACILITY:  [facility]  PHYSICIAN:  Lockie Pares, M.D.    DATE OF BIRTH:  1951-04-04  DATE OF PROCEDURE:  04/02/2014 DATE OF DISCHARGE:                              OPERATIVE REPORT   PREOPERATIVE DIAGNOSIS:  Osteoarthritis of left knee with varus deformity.  POSTOPERATIVE DIAGNOSIS:  Osteoarthritis of left knee with varus deformity.  OPERATION:  Left total knee replacement (Sigma cemented knee with size 3 tibia, femur, 12.5-mm bearing and 35-mm all poly patella).  SURGEON:  Lockie Pares, M.D.  ASSISTANT:  Chriss Czar, PA-C.  ANESTHESIA:  General nerve block.  TOURNIQUET TIME:  1 hour and 5 minutes.  DESCRIPTION OF PROCEDURE:  Sterile prep and drape, exsanguination of the leg and inflation to 350, straight skin incision with a medial parapatellar approach to the knee made.  We cut 11-degree valgus cut off the distal femur of 5 degrees followed by cutting about 4 degrees most diseased medial compartment.  Moderate varus deformity was noted with balancing to stripping of the medial collateral ligament.  The extension gap measured 12.5 mm.  Femur was sized to be a size 3.  We placed the appropriate pins with slight external rotation based on the balancing technique and then placed the two final pins for the anterior, posterior, chamfer cut block.  These were accomplished without difficulty.  Excess menisci and the PCL were released, flexion gap equaled the extension gap at 12.5 mm.  Tibia was sized to be a size 3 with the keel hook cut for the trial tibia, trial tibial base plate placed with a trial femur after cutting the box for the femur.  We then cut for a 35-mm all poly patellar trial leaving about 14 mm of native patella.  All trials were checked.  Parameters were deemed to be acceptable with excellent flexion, good balancing between the  medial and lateral sides, no tendency for subluxation or bearing spin-out.  Final components were removed.  Bony surfaces were irrigated.  We then cemented in with antibiotic impregnated cement, the tibia followed by the femur.  With the patella, we allowed the cement to harden, but elected to use a trial insert.  Trial insert was removed.  Small amounts of cement were identified at the posterior aspect of the knee.  The tourniquet was then released.  Small bleeders were coagulated.  No excessive bleeding was noted.  The final tibial base plate was placed and all parameters were again checked and deemed to be acceptable.  Hemovac drain was placed, exiting through superolaterally with closure with #1 Ethibond, 0 and 2-0 Vicryl with staples on the skin.  Lightly compressive sterile dressing, knee immobilizer applied.  Taken to the recovery room in stable condition.     Lockie Pares, M.D.     WDC/MEDQ  D:  04/02/2014  T:  04/03/2014  Job:  [id number]

## 2014-04-04 LAB — CBC
HEMATOCRIT: 32.7 % — AB (ref 36.0–46.0)
HEMOGLOBIN: 10.3 g/dL — AB (ref 12.0–15.0)
MCH: 27.8 pg (ref 26.0–34.0)
MCHC: 31.5 g/dL (ref 30.0–36.0)
MCV: 88.4 fL (ref 78.0–100.0)
Platelets: 206 10*3/uL (ref 150–400)
RBC: 3.7 MIL/uL — AB (ref 3.87–5.11)
RDW: 14.9 % (ref 11.5–15.5)
WBC: 9 10*3/uL (ref 4.0–10.5)

## 2014-04-04 MED ORDER — OXYCODONE-ACETAMINOPHEN 5-325 MG PO TABS: 1.0000 | ORAL_TABLET | ORAL | Status: DC | PRN

## 2014-04-04 NOTE — Progress Notes (Signed)
Subjective: 2 Days Post-Op Procedure(s) (LRB): TOTAL KNEE ARTHROPLASTY (Left) Patient reports pain as mild and moderate.    Objective: Vital signs in last 24 hours: Temp:  [98.2 F (36.8 C)-99.3 F (37.4 C)] 99.3 F (37.4 C) (08/30 0553) Pulse Rate:  [101-117] 108 (08/30 0553) Resp:  [16-18] 16 (08/30 0553) BP: (101-130)/(51-84) 127/62 mmHg (08/30 0553) SpO2:  [88 %-100 %] 96 % (08/30 0553)  Intake/Output from previous day: 08/29 0701 - 08/30 0700 In: -  Out: 125 [Drains:125] Intake/Output this shift:     Recent Labs  04/03/14 0730 04/04/14 0525  HGB 11.1* 10.3*    Recent Labs  04/03/14 0730 04/04/14 0525  WBC 9.5 9.0  RBC 4.05 3.70*  HCT 35.0* 32.7*  PLT 247 206    Recent Labs  04/03/14 0730  NA 140  K 4.0  CL 100  CO2 29  BUN 10  CREATININE 0.78  GLUCOSE 135*  CALCIUM 8.9   No results found for this basename: LABPT, INR,  in the last 72 hours  Neurovascular intact Sensation intact distally Intact pulses distally Dorsiflexion/Plantar flexion intact Incision: dressing C/D/I and scant drainage No cellulitis present Compartment soft Dressing changed hemovac d/c  Assessment/Plan: 2 Days Post-Op Procedure(s) (LRB): TOTAL KNEE ARTHROPLASTY (Left) Up with therapy Discharge home with home health  Chriss Czar 04/04/2014, 8:46 AM

## 2014-04-04 NOTE — Progress Notes (Signed)
Physical Therapy Treatment Patient Details Name: Selena [name] MRN: [id number] DOB: 01-May-1951 Today's Date: 04/04/2014    History of Present Illness Patient presents s/p left TKA.  PMH of R TKA 2013, back pain, GERD, PUD, Cervical CA and CAD.    PT Comments    Pt eager to d/c home.  Ambulated from room>gym & completed stair training this session.    Follow Up Recommendations  Home health PT;Supervision/Assistance - 24 hour     Equipment Recommendations  None recommended by PT    Recommendations for Other Services       Precautions / Restrictions Precautions Precautions: Fall Required Braces or Orthoses: Knee Immobilizer - Left Knee Immobilizer - Left: On when out of bed or walking Restrictions LLE Weight Bearing: Weight bearing as tolerated    Mobility  Bed Mobility                  Transfers Overall transfer level: Needs assistance Equipment used: Rolling walker (2 wheeled) Transfers: Sit to/from Stand Sit to Stand: Min guard            Ambulation/Gait Ambulation/Gait assistance: Min guard Ambulation Distance (Feet): 200 Feet (100'x2) Assistive device: Rolling walker (2 wheeled) Gait Pattern/deviations: Step-through pattern;Decreased stride length;Decreased weight shift to left;Decreased step length - right;Antalgic Gait velocity: decreased   General Gait Details: cues for increased step/stride length & encouragement to increase LLE WBing.     Stairs Stairs: Yes Stairs assistance: Mod assist Stair Management: One rail Left;Forwards (rail to simulate door frame) Number of Stairs: 3 General stair comments: Pt's husband present.  Husband stood behind pt & supported her around waist & lifted pt to next step while pt held onto Lt rail to simulate doorframe.  When coming down, husband in front of pt with pt placing her hands on husband's shoulders for support.  Educated on backwards technique with RW but husband & pt state they'd prefer to perform  without RW.  Handout provided to pt & husband.    Wheelchair Mobility    Modified Rankin (Stroke Patients Only)       Balance                                    Cognition Arousal/Alertness: Awake/alert Behavior During Therapy: WFL for tasks assessed/performed Overall Cognitive Status: Within Functional Limits for tasks assessed                      Exercises      General Comments        Pertinent Vitals/Pain Pain Assessment: 0-10 Pain Score: 6  Pain Location: Lt knee Pain Descriptors / Indicators: Aching Pain Intervention(s): Limited activity within patient's tolerance;Monitored during session;Repositioned;Premedicated before session    Home Living                      Prior Function            PT Goals (current goals can now be found in the care plan section) Acute Rehab PT Goals PT Goal Formulation: With patient Time For Goal Achievement: 04/09/14 Potential to Achieve Goals: Good Progress towards PT goals: Progressing toward goals    Frequency  Min 6X/week    PT Plan Current plan remains appropriate    Co-evaluation             End of Session Equipment Utilized During Treatment: Left knee  immobilizer Activity Tolerance: Patient tolerated treatment well Patient left: in chair;with call bell/phone within reach;with family/visitor present     Time: 0922-0942 PT Time Calculation (min): 20 min  Charges:  $Gait Training: 8-22 mins                    G Codes:      Sena Hitch 04/04/2014, 12:52 PM   Sarajane Marek, PTA [phone] 04/04/2014

## 2014-04-06 ENCOUNTER — Encounter (HOSPITAL_COMMUNITY): Payer: Self-pay | Admitting: Orthopedic Surgery

## 2014-04-15 ENCOUNTER — Other Ambulatory Visit (HOSPITAL_COMMUNITY): Payer: Self-pay | Admitting: Cardiology

## 2014-04-15 ENCOUNTER — Ambulatory Visit (HOSPITAL_COMMUNITY): Payer: BC Managed Care – PPO | Attending: Cardiovascular Disease | Admitting: Cardiology

## 2014-04-15 DIAGNOSIS — M79609 Pain in unspecified limb: Secondary | ICD-10-CM | POA: Diagnosis not present

## 2014-04-15 DIAGNOSIS — M1710 Unilateral primary osteoarthritis, unspecified knee: Secondary | ICD-10-CM | POA: Insufficient documentation

## 2014-04-15 DIAGNOSIS — M79662 Pain in left lower leg: Secondary | ICD-10-CM

## 2014-04-15 DIAGNOSIS — Z96659 Presence of unspecified artificial knee joint: Secondary | ICD-10-CM | POA: Diagnosis not present

## 2014-04-15 DIAGNOSIS — I2510 Atherosclerotic heart disease of native coronary artery without angina pectoris: Secondary | ICD-10-CM | POA: Insufficient documentation

## 2014-04-15 DIAGNOSIS — Z87891 Personal history of nicotine dependence: Secondary | ICD-10-CM | POA: Diagnosis not present

## 2014-04-15 DIAGNOSIS — Z8673 Personal history of transient ischemic attack (TIA), and cerebral infarction without residual deficits: Secondary | ICD-10-CM | POA: Insufficient documentation

## 2014-04-15 DIAGNOSIS — M7989 Other specified soft tissue disorders: Secondary | ICD-10-CM | POA: Diagnosis not present

## 2014-04-15 DIAGNOSIS — E785 Hyperlipidemia, unspecified: Secondary | ICD-10-CM | POA: Insufficient documentation

## 2014-04-15 DIAGNOSIS — I10 Essential (primary) hypertension: Secondary | ICD-10-CM | POA: Insufficient documentation

## 2014-04-15 DIAGNOSIS — IMO0002 Reserved for concepts with insufficient information to code with codable children: Secondary | ICD-10-CM | POA: Insufficient documentation

## 2014-04-15 NOTE — Progress Notes (Signed)
Left LE Venous Duplex performed

## 2014-04-22 ENCOUNTER — Ambulatory Visit (HOSPITAL_COMMUNITY)
Admission: RE | Admit: 2014-04-22 | Discharge: 2014-04-22 | Disposition: A | Discharge status: Home / Self Care | Payer: BC Managed Care – PPO | Source: Ambulatory Visit | Attending: Internal Medicine | Admitting: Internal Medicine

## 2014-04-22 DIAGNOSIS — M25669 Stiffness of unspecified knee, not elsewhere classified: Secondary | ICD-10-CM | POA: Diagnosis not present

## 2014-04-22 DIAGNOSIS — R262 Difficulty in walking, not elsewhere classified: Secondary | ICD-10-CM | POA: Insufficient documentation

## 2014-04-22 DIAGNOSIS — M25569 Pain in unspecified knee: Secondary | ICD-10-CM | POA: Insufficient documentation

## 2014-04-22 DIAGNOSIS — R29898 Other symptoms and signs involving the musculoskeletal system: Secondary | ICD-10-CM | POA: Insufficient documentation

## 2014-04-22 DIAGNOSIS — IMO0001 Reserved for inherently not codable concepts without codable children: Secondary | ICD-10-CM | POA: Diagnosis present

## 2014-04-22 DIAGNOSIS — M25562 Pain in left knee: Secondary | ICD-10-CM

## 2014-04-22 DIAGNOSIS — I10 Essential (primary) hypertension: Secondary | ICD-10-CM | POA: Insufficient documentation

## 2014-04-23 NOTE — Evaluation (Signed)
Physical Therapy Evaluation  Patient Details  Name: Selena [name] MRN: [id number] Date of Birth: Feb 20, 1951  Today's Date: 04/23/2014 Time: 9485-4627 PT Time Calculation (min): 15 min     Charges: 1 Eval         Visit#: 1 of 16  Re-eval:   Assessment Diagnosis: Lt knee stiffness/weakness follwoing TKA resultign in difficulty walking Surgical Date: 04/02/14  Authorization: BCBS     Past Medical History:  Past Medical History  Diagnosis Date  . Cervical ca 1984  . Constipation   . Hemorrhoids   . HTN (hypertension)   . Depression     w/ menopause  . Hyperlipemia   . Hypothyroidism   . Tachycardia     being evaluated recently by Dr. Rollene Fare  . TIA (transient ischemic attack) 1983  . GERD (gastroesophageal reflux disease)   . Bursitis   . Chronic abdominal pain     functional, seen @[facility] , extensive WU here benign   . Skin cancer     nose  . Chronic back pain     on methadone  . PUD (peptic ulcer disease) 1997/1998    negative h pylori  . Abnormal findings on imaging of biliary tract 1997-2000    CBD 51mm, Dr Albin Felling biliary/pancreatic manometry, EUS Dr. Ardis Hughs 09/18/07 dilated but otherwise normal extrahepatic duct, no masses  . S/P colonoscopy 09/10/07    Dr. Donald Pore sec to tortuous colon, followed by [facility] & flex sig normal  . CAD (coronary artery disease)   . MI (myocardial infarction)   . Shortness of breath     with exertion  . Stroke     1983  . H/O hiatal hernia   . Arthritis   . Complication of anesthesia     years ago woke up during surgery   Past Surgical History:  Past Surgical History  Procedure Laterality Date  . Partial hysterectomy    . Cholecystectomy    . Appendectomy    . Right arm      nerve  . Inguinal hernia repair      left  . Tonsillectomy    . Joint replacement    . Arthroscopic knee    . Total knee arthroplasty  06/20/2012    Procedure: TOTAL KNEE ARTHROPLASTY;  Surgeon: Yvette Rack., MD;  Location: [facility];  Service:  Orthopedics;  Laterality: Right;  . Tonsillectomy    . Abdominal hysterectomy    . Total knee arthroplasty Left 04/02/2014    Procedure: TOTAL KNEE ARTHROPLASTY;  Surgeon: Yvette Rack., MD;  Location: [facility];  Service: Orthopedics;  Laterality: Left;   Subjective Symptoms/Limitations Pertinent History: Selena [name], 63 y.o. female, who arives s/p LtTKA She has a history of pain and functional disability in the left knee due to arthritis and has failed non-surgical conservative treatments for greater than 12 weeks to includeNSAID's and/or analgesics, corticosteriod injections, use of assistive devices and activity modification.  Onset of symptoms was gradual, starting 6 years ago with gradually worsening course since that time. The patient noted prior procedures on the knee to include  arthroscopy and menisectomy on the left knee(s).   Pain Assessment Currently in Pain?: Yes Pain Score: 6  Pain Location: Knee Pain Orientation: Left Pain Type: Surgical pain Pain Relieving Factors: medication, rest   Sensation/Coordination/Flexibility/Functional Tests Flexibility Thomas: Positive Obers: Positive Functional Tests Functional Tests: 3D hip excursion: limited all directions Functional Tests: Ely: positive Functional Tests: Gait: Lt antalgic  Assessment LLE AROM (degrees)  Left Knee Extension: -9 Left Knee Flexion: 89 LLE Strength Left Hip Flexion: 2+/5 Left Knee Flexion:  (4-/5) Left Knee Extension:  (4-/5) Left Ankle Dorsiflexion: 5/5  Physical Therapy Assessment and Plan PT Assessment and Plan Clinical Impression Statement: Patient arrived to therapy 37minutes late resulting in limited examination. Patient displays knee stiffness decreeased strength and pain following a  Lt TKA resultign in abnormal gait pattern and difficulty walking. Patient will benefit from skilled phsyical therapy to increase knee ROM and Strength to return to regular walking withtou difficulty.  Pt will  benefit from skilled therapeutic intervention in order to improve on the following deficits: Abnormal gait;Decreased activity tolerance;Decreased balance;Decreased strength;Difficulty walking;Pain;Impaired flexibility;Improper body mechanics;Decreased range of motion Rehab Potential: Good PT Frequency: Min 2X/week PT Duration: 8 weeks PT Treatment/Interventions: Gait training;Therapeutic exercise;Stair training;Balance training;Functional mobility training;Therapeutic activities;Patient/family education;Neuromuscular re-education;Modalities;Manual techniques PT Plan: Initial focus on reestablishing full knee ROM. as mobility improves progress to strengtheing.     Goals Home Exercise Program Pt/caregiver will Perform Home Exercise Program: For increased ROM PT Goal: Perform Home Exercise Program - Progress: Goal set today PT Short Term Goals Time to Complete Short Term Goals: 4 weeks PT Short Term Goal 1: Patient will demosntrate increased knee extension <-5 degrees from neutral to ambulate with normalized gait PT Short Term Goal 2: Patient will demonstrate increased knee flexion of >100 degrees to more easily perform stand to sit  PT Long Term Goals Time to Complete Long Term Goals: 8 weeks PT Long Term Goal 1: Patient will demosntrate increased knee extension 0 degrees from neutral to ambulate with normalized gait PT Long Term Goal 2: Patient will demonstrate increased knee flexion of >115 degrees to more easily perform stand to sit  Long Term Goal 3: atient will dmeosntrate increreased knee flexion strength to >4/5  to ambulate with improved control of knee Long Term Goal 4: Patient will demonstrate increased knee extension strength of >4/5 to be able to sit to stand without UE use.  PT Long Term Goal 5: Patient will be able to ambualte >59minutes without pain  Problem List Patient Active Problem List   Diagnosis Date Noted  . Pain in joint, lower leg 04/22/2014  . Osteoarthritis of left  knee 04/02/2014  . Stiffness of joint, not elsewhere classified, lower leg 07/24/2012  . Difficulty in walking 07/24/2012  . DJD (degenerative joint disease) of knee, end stage R knee 06/21/2012  . HTN (hypertension)   . Depression   . Hyperlipemia   . Hypothyroidism   . Chronic back pain   . PUD (peptic ulcer disease)   . CAD (coronary artery disease)   . Knee pain 01/07/2012  . Abnormality of gait 01/07/2012  . Muscle weakness (generalized) 01/07/2012  . Dysphagia 12/13/2010  . Abnormal finding of biliary tract 12/13/2010  . GERD (gastroesophageal reflux disease) 12/13/2010    PT - End of Session Activity Tolerance: Patient tolerated treatment well General Behavior During Therapy: WFL for tasks assessed/performed PT Plan of Care PT Home Exercise Plan: to be given next session.   GP    Sondos Wolfman R 04/23/2014, 8:35 AM  Physician Documentation Your signature is required to indicate approval of the treatment plan as stated above.  Please sign and either send electronically or make a copy of this report for your files and return this physician signed original.   Please mark one 1.__approve of plan  2. ___approve of plan with the following conditions.   ______________________________  _____________________ Physician Signature                                                                                                             Date

## 2014-05-10 ENCOUNTER — Inpatient Hospital Stay (HOSPITAL_COMMUNITY)
Admission: RE | Admit: 2014-05-10 | Payer: BC Managed Care – PPO | Source: Ambulatory Visit | Admitting: Physical Therapy

## 2014-05-12 ENCOUNTER — Ambulatory Visit (HOSPITAL_COMMUNITY)
Admission: RE | Admit: 2014-05-12 | Discharge: 2014-05-12 | Disposition: A | Discharge status: Home / Self Care | Payer: BC Managed Care – PPO | Source: Ambulatory Visit | Attending: Internal Medicine | Admitting: Internal Medicine

## 2014-05-12 DIAGNOSIS — R29898 Other symptoms and signs involving the musculoskeletal system: Secondary | ICD-10-CM | POA: Insufficient documentation

## 2014-05-12 DIAGNOSIS — M25662 Stiffness of left knee, not elsewhere classified: Secondary | ICD-10-CM | POA: Insufficient documentation

## 2014-05-12 DIAGNOSIS — R262 Difficulty in walking, not elsewhere classified: Secondary | ICD-10-CM | POA: Insufficient documentation

## 2014-05-12 DIAGNOSIS — M25562 Pain in left knee: Secondary | ICD-10-CM | POA: Diagnosis not present

## 2014-05-12 DIAGNOSIS — Z5189 Encounter for other specified aftercare: Secondary | ICD-10-CM | POA: Diagnosis not present

## 2014-05-12 DIAGNOSIS — I10 Essential (primary) hypertension: Secondary | ICD-10-CM | POA: Insufficient documentation

## 2014-05-12 NOTE — Progress Notes (Signed)
Physical Therapy Treatment Patient Details  Name: Selena [name] MRN: [id number] Date of Birth: 18-Oct-1950  Today's Date: 05/12/2014 Time: 5397-6734 PT Time Calculation (min): 43 min   Charges TE [phone] Visit#: 2 of 16  Re-eval:   Assessment Diagnosis: Lt knee stiffness/weakness follwoing TKA resultign in difficulty walking Surgical Date: 04/02/14  Authorization: [employer]     Subjective: Symptoms/Limitations Symptoms: Patient states she has not been to therapy the last 2 weeks because no one told her to come back despite her therapist telling her after last session that he wanted to see her 2x a week for 8 weeks for which patient is now scheduled. .   Exercise/Treatments Stretches Active Hamstring Stretch: Limitations Active Hamstring Stretch Limitations: 14" box 3 way 10x 3seconds Quad Stretch: Limitations Quad Stretch Limitations: 5x20seconds prone Hip Flexor Stretch: Limitations Hip Flexor Stretch Limitations: 14" box 3 way 10x 3 seconds Aerobic Stationary Bike: Nustep 25min lvl 4 Standing Functional Squat: Limitations Functional Squat Limitations: Squat matrix <5 reps per set with UE support Other Standing Knee Exercises: 3D hip excursion 10x each Other Standing Knee Exercises: anterior knee driver 09B   Physical Therapy Assessment and Plan PT Assessment and Plan Clinical Impression Statement: session initiat4ed focus on increasing knee extension and flexion AROM with predominant focus on increasign knee extension AROM. patient demosntrated improved gat at end of session follwoign stretches and 3D hip excursions. Session concluding with 59minut on Nu step for activity tolerance as patient was quick to fatigue throughtou session PT Plan: Conitnyue focus on progressing knee extension >flexion AROM and increasing activity tolerance via NUstep for 38min at lvl 4, perform squat matrix for LE strengthening.     Goals PT Short Term Goals PT Short Term Goal 1: Patient will  demosntrate increased knee extension <-5 degrees from neutral to ambulate with normalized gait PT Short Term Goal 1 - Progress: Progressing toward goal PT Short Term Goal 2: Patient will demonstrate increased knee flexion of >100 degrees to more easily perform stand to sit  PT Short Term Goal 2 - Progress: Progressing toward goal PT Long Term Goals PT Long Term Goal 1: Patient will demosntrate increased knee extension 0 degrees from neutral to ambulate with normalized gait PT Long Term Goal 1 - Progress: Progressing toward goal PT Long Term Goal 2: Patient will demonstrate increased knee flexion of >115 degrees to more easily perform stand to sit  PT Long Term Goal 2 - Progress: Progressing toward goal Long Term Goal 3: atient will demosntrate increreased knee flexion strength to >4/5  to ambulate with improved control of knee Long Term Goal 3 Progress: Progressing toward goal Long Term Goal 4: Patient will demonstrate increased knee extension strength of >4/5 to be able to sit to stand without UE use.  Long Term Goal 4 Progress: Progressing toward goal PT Long Term Goal 5: Patient will be able to ambualte >1minutes without pain Long Term Goal 5 Progress: Progressing toward goal  Problem List Patient Active Problem List   Diagnosis Date Noted  . Pain in joint, lower leg 04/22/2014  . Osteoarthritis of left knee 04/02/2014  . Stiffness of joint, not elsewhere classified, lower leg 07/24/2012  . Difficulty in walking 07/24/2012  . DJD (degenerative joint disease) of knee, end stage R knee 06/21/2012  . HTN (hypertension)   . Depression   . Hyperlipemia   . Hypothyroidism   . Chronic back pain   . PUD (peptic ulcer disease)   . CAD (coronary artery disease)   .  Knee pain 01/07/2012  . Abnormality of gait 01/07/2012  . Muscle weakness (generalized) 01/07/2012  . Dysphagia 12/13/2010  . Abnormal finding of biliary tract 12/13/2010  . GERD (gastroesophageal reflux disease) 12/13/2010     PT - End of Session Activity Tolerance: Patient tolerated treatment well General Behavior During Therapy: [facility] for tasks assessed/performed  GP    Rashaan Wyles R 05/12/2014, 6:45 PM

## 2014-05-17 ENCOUNTER — Ambulatory Visit (HOSPITAL_COMMUNITY)
Admission: RE | Admit: 2014-05-17 | Payer: BC Managed Care – PPO | Source: Ambulatory Visit | Admitting: Physical Therapy

## 2014-05-19 ENCOUNTER — Ambulatory Visit (HOSPITAL_COMMUNITY): Payer: BC Managed Care – PPO | Admitting: Physical Therapy

## 2014-05-24 ENCOUNTER — Inpatient Hospital Stay (HOSPITAL_COMMUNITY)
Admission: RE | Admit: 2014-05-24 | Payer: BC Managed Care – PPO | Source: Ambulatory Visit | Admitting: Physical Therapy

## 2014-05-26 ENCOUNTER — Ambulatory Visit (HOSPITAL_COMMUNITY): Payer: BC Managed Care – PPO | Admitting: Physical Therapy

## 2014-05-31 ENCOUNTER — Ambulatory Visit (HOSPITAL_COMMUNITY)
Admission: RE | Admit: 2014-05-31 | Payer: BC Managed Care – PPO | Source: Ambulatory Visit | Attending: Physical Therapy | Admitting: Physical Therapy

## 2014-06-02 ENCOUNTER — Ambulatory Visit (HOSPITAL_COMMUNITY)
Admission: RE | Admit: 2014-06-02 | Payer: BC Managed Care – PPO | Source: Ambulatory Visit | Attending: Physical Therapy | Admitting: Physical Therapy

## 2014-07-15 ENCOUNTER — Encounter (HOSPITAL_COMMUNITY): Payer: Self-pay | Admitting: Physical Therapy

## 2014-07-15 NOTE — Therapy (Unsigned)
[facility] [address] [place], [place], [address] Phone: [phone]   Fax:  [phone]  Patient Details  Name: Selena [name] MRN: [id number] Date of Birth: 05-01-1951  Encounter Date: 07/15/2014  PHYSICAL THERAPY DISCHARGE SUMMARY  Visits from Start of Care: 2  Current functional level related to goals / functional outcomes:  Remaining deficits: Goals PT Short Term Goals PT Short Term Goal 1: Patient will demosntrate increased knee extension <-5 degrees from neutral to ambulate with normalized gait PT Short Term Goal 1 - Progress: no met PT Short Term Goal 2: Patient will demonstrate increased knee flexion of >100 degrees to more easily perform stand to sit  PT Short Term Goal 2 - Progress: not met PT Long Term Goals PT Long Term Goal 1: Patient will demosntrate increased knee extension 0 degrees from neutral to ambulate with normalized gait PT Long Term Goal 1 - Progress:Not met PT Long Term Goal 2: Patient will demonstrate increased knee flexion of >115 degrees to more easily perform stand to sit  PT Long Term Goal 2 - Progress: Not metl Long Term Goal 3: atient will demosntrate increreased knee flexion strength to >4/5 to ambulate with improved control of knee Long Term Goal 3 Progress:Not met Long Term Goal 4: Patient will demonstrate increased knee extension strength of >4/5 to be able to sit to stand without UE use.  Long Term Goal 4 Progress: Not met PT Long Term Goal 5: Patient will be able to ambualte >30mnutes without pain Long Term Goal 5 Progress: Not met   Plan: Patient agrees to discharge.  Patient goals were not met. Patient is being discharged due to not returning since the last visit.  ?????6 missed appointments       CDevona KonigPT DPT [phone]

## 2014-08-24 NOTE — Patient Instructions (Addendum)
Your procedure is scheduled on:  Thursday, 09/02/14  Report to [facility] at  0920 AM.  Call this number if you have problems the morning of surgery: [phone]   Remember:   Do not eat or drink   After Midnight.  Take these medicines the morning of surgery with A SIP OF WATER: metoprolol, neurontin, losartan, roxicet or oxycontin, methadone, and advair. Please use and bring your inhalers.   Do not wear jewelry, make-up or nail polish.  Do not wear lotions, powders, or perfumes. You may wear deodorant.  Do not bring valuables to the hospital.  Contacts, dentures or bridgework may not be worn into surgery.     Patients discharged the day of surgery will not be allowed to drive home.  Name and phone number of your driver: driver  Special Instructions: Use eye drops as directed.   Please read over the following fact sheets that you were given: Pain Booklet, Anesthesia Post-op Instructions and Care and Recovery After Surgery    Cataract Surgery  A cataract is a clouding of the lens of the eye. When a lens becomes cloudy, vision is reduced based on the degree and nature of the clouding. Surgery may be needed to improve vision. Surgery removes the cloudy lens and usually replaces it with a substitute lens (intraocular lens, IOL). LET YOUR EYE DOCTOR KNOW ABOUT:  Allergies to food or medicine.   Medicines taken including herbs, eyedrops, over-the-counter medicines, and creams.   Use of steroids (by mouth or creams).   Previous problems with anesthetics or numbing medicine.   History of bleeding problems or blood clots.   Previous surgery.   Other health problems, including diabetes and kidney problems.   Possibility of pregnancy, if this applies.  RISKS AND COMPLICATIONS  Infection.   Inflammation of the eyeball (endophthalmitis) that can spread to both eyes (sympathetic ophthalmia).   Poor wound healing.   If an IOL is inserted, it can later fall out of proper position. This  is very uncommon.   Clouding of the part of your eye that holds an IOL in place. This is called an "after-cataract." These are uncommon, but easily treated.  BEFORE THE PROCEDURE  Do not eat or drink anything except small amounts of water for 8 to 12 before your surgery, or as directed by your caregiver.   Unless you are told otherwise, continue any eyedrops you have been prescribed.   Talk to your primary caregiver about all other medicines that you take (both prescription and non-prescription). In some cases, you may need to stop or change medicines near the time of your surgery. This is most important if you are taking blood-thinning medicine.Do not stop medicines unless you are told to do so.   Arrange for someone to drive you to and from the procedure.   Do not put contact lenses in either eye on the day of your surgery.  PROCEDURE There is more than one method for safely removing a cataract. Your doctor can explain the differences and help determine which is best for you. Phacoemulsification surgery is the most common form of cataract surgery.  An injection is given behind the eye or eyedrops are given to make this a painless procedure.   A small cut (incision) is made on the edge of the clear, dome-shaped surface that covers the front of the eye (cornea).   A tiny probe is painlessly inserted into the eye. This device gives off ultrasound waves that soften and break up  the cloudy center of the lens. This makes it easier for the cloudy lens to be removed by suction.   An IOL may be implanted.   The normal lens of the eye is covered by a clear capsule. Part of that capsule is intentionally left in the eye to support the IOL.   Your surgeon may or may not use stitches to close the incision.  There are other forms of cataract surgery that require a larger incision and stiches to close the eye. This approach is taken in cases where the doctor feels that the cataract cannot be easily  removed using phacoemulsification. AFTER THE PROCEDURE  When an IOL is implanted, it does not need care. It becomes a permanent part of your eye and cannot be seen or felt.   Your doctor will schedule follow-up exams to check on your progress.   Review your other medicines with your doctor to see which can be resumed after surgery.   Use eyedrops or take medicine as prescribed by your doctor.  Document Released: 07/12/2011 Document Reviewed: 07/09/2011 [facility] Patient Information 2012 [place].  PATIENT INSTRUCTIONS POST-ANESTHESIA  IMMEDIATELY FOLLOWING SURGERY:  Do not drive or operate machinery for the first twenty four hours after surgery.  Do not make any important decisions for twenty four hours after surgery or while taking narcotic pain medications or sedatives.  If you develop intractable nausea and vomiting or a severe headache please notify your doctor immediately.  FOLLOW-UP:  Please make an appointment with your surgeon as instructed. You do not need to follow up with anesthesia unless specifically instructed to do so.  WOUND CARE INSTRUCTIONS (if applicable):  Keep a dry clean dressing on the anesthesia/puncture wound site if there is drainage.  Once the wound has quit draining you may leave it open to air.  Generally you should leave the bandage intact for twenty four hours unless there is drainage.  If the epidural site drains for more than 36-48 hours please call the anesthesia department.  QUESTIONS?:  Please feel free to call your physician or the hospital operator if you have any questions, and they will be happy to assist you.

## 2014-08-25 ENCOUNTER — Encounter (HOSPITAL_COMMUNITY): Payer: Self-pay

## 2014-08-25 ENCOUNTER — Encounter (HOSPITAL_COMMUNITY)
Admission: RE | Admit: 2014-08-25 | Discharge: 2014-08-25 | Disposition: A | Discharge status: Home / Self Care | Payer: BLUE CROSS/BLUE SHIELD | Source: Ambulatory Visit | Attending: Ophthalmology | Admitting: Ophthalmology

## 2014-08-25 DIAGNOSIS — H269 Unspecified cataract: Secondary | ICD-10-CM | POA: Diagnosis not present

## 2014-08-25 DIAGNOSIS — Z01818 Encounter for other preprocedural examination: Secondary | ICD-10-CM | POA: Insufficient documentation

## 2014-08-25 HISTORY — DX: Cardiac arrhythmia, unspecified: I49.9

## 2014-08-25 LAB — BASIC METABOLIC PANEL
Anion gap: 6 (ref 5–15)
BUN: 18 mg/dL (ref 6–23)
CALCIUM: 9.4 mg/dL (ref 8.4–10.5)
CO2: 29 mmol/L (ref 19–32)
CREATININE: 0.99 mg/dL (ref 0.50–1.10)
Chloride: 104 mEq/L (ref 96–112)
GFR calc non Af Amer: 59 mL/min — ABNORMAL LOW (ref >90)
GFR, EST AFRICAN AMERICAN: 69 mL/min — AB (ref >90)
GLUCOSE: 95 mg/dL (ref 70–99)
Potassium: 4.4 mmol/L (ref 3.5–5.1)
Sodium: 139 mmol/L (ref 135–145)

## 2014-08-25 LAB — HEMOGLOBIN AND HEMATOCRIT, BLOOD
HEMATOCRIT: 37 % (ref 36.0–46.0)
HEMOGLOBIN: 11.5 g/dL — AB (ref 12.0–15.0)

## 2014-08-25 NOTE — Pre-Procedure Instructions (Signed)
Patient given information to sign up for my chart at home. 

## 2014-09-01 MED ORDER — TETRACAINE HCL 0.5 % OP SOLN
OPHTHALMIC | Status: AC
  Filled 2014-09-01: qty 2

## 2014-09-01 MED ORDER — NEOMYCIN-POLYMYXIN-DEXAMETH 3.5-10000-0.1 OP SUSP
OPHTHALMIC | Status: AC
  Filled 2014-09-01: qty 5

## 2014-09-01 MED ORDER — LIDOCAINE HCL (PF) 1 % IJ SOLN
INTRAMUSCULAR | Status: AC
  Filled 2014-09-01: qty 2

## 2014-09-01 MED ORDER — PHENYLEPHRINE HCL 2.5 % OP SOLN
OPHTHALMIC | Status: AC
  Filled 2014-09-01: qty 15

## 2014-09-01 MED ORDER — CYCLOPENTOLATE-PHENYLEPHRINE OP SOLN OPTIME - NO CHARGE
OPHTHALMIC | Status: AC
  Filled 2014-09-01: qty 2

## 2014-09-01 MED ORDER — LIDOCAINE HCL 3.5 % OP GEL
OPHTHALMIC | Status: AC
  Filled 2014-09-01: qty 1

## 2014-09-02 ENCOUNTER — Ambulatory Visit (HOSPITAL_COMMUNITY)
Admission: RE | Admit: 2014-09-02 | Discharge: 2014-09-02 | Disposition: A | Discharge status: Home / Self Care | Payer: BLUE CROSS/BLUE SHIELD | Source: Ambulatory Visit | Attending: Ophthalmology | Admitting: Ophthalmology

## 2014-09-02 ENCOUNTER — Encounter (HOSPITAL_COMMUNITY): Payer: Self-pay | Admitting: *Deleted

## 2014-09-02 ENCOUNTER — Ambulatory Visit (HOSPITAL_COMMUNITY): Payer: BLUE CROSS/BLUE SHIELD | Admitting: Anesthesiology

## 2014-09-02 ENCOUNTER — Encounter (HOSPITAL_COMMUNITY)
Admission: RE | Disposition: A | Discharge status: Home / Self Care | Payer: Self-pay | Source: Ambulatory Visit | Attending: Ophthalmology

## 2014-09-02 DIAGNOSIS — H25811 Combined forms of age-related cataract, right eye: Secondary | ICD-10-CM | POA: Diagnosis present

## 2014-09-02 HISTORY — PX: CATARACT EXTRACTION W/PHACO: SHX586

## 2014-09-02 SURGERY — PHACOEMULSIFICATION, CATARACT, WITH IOL INSERTION
Anesthesia: Monitor Anesthesia Care | Site: Eye | Laterality: Right

## 2014-09-02 MED ORDER — LIDOCAINE HCL (PF) 1 % IJ SOLN
INTRAMUSCULAR | Status: DC | PRN
  Administered 2014-09-02: .5 mL

## 2014-09-02 MED ORDER — BSS IO SOLN
INTRAOCULAR | Status: DC | PRN
  Administered 2014-09-02: 15 mL via INTRAOCULAR

## 2014-09-02 MED ORDER — MIDAZOLAM HCL 2 MG/2ML IJ SOLN
INTRAMUSCULAR | Status: AC
  Filled 2014-09-02: qty 2

## 2014-09-02 MED ORDER — LIDOCAINE 3.5 % OP GEL OPTIME - NO CHARGE
OPHTHALMIC | Status: DC | PRN
  Administered 2014-09-02: 1 [drp] via OPHTHALMIC

## 2014-09-02 MED ORDER — LIDOCAINE HCL 3.5 % OP GEL
1.0000 "application " | Freq: Once | OPHTHALMIC | Status: AC
  Administered 2014-09-02: 1 via OPHTHALMIC

## 2014-09-02 MED ORDER — EPINEPHRINE HCL 1 MG/ML IJ SOLN
INTRAMUSCULAR | Status: AC
  Filled 2014-09-02: qty 1

## 2014-09-02 MED ORDER — NEOMYCIN-POLYMYXIN-DEXAMETH 3.5-10000-0.1 OP SUSP
OPHTHALMIC | Status: DC | PRN
  Administered 2014-09-02: 1 [drp] via OPHTHALMIC

## 2014-09-02 MED ORDER — EPINEPHRINE HCL 1 MG/ML IJ SOLN
INTRAOCULAR | Status: DC | PRN
  Administered 2014-09-02: 10:00:00

## 2014-09-02 MED ORDER — LACTATED RINGERS IV SOLN
INTRAVENOUS | Status: DC
  Administered 2014-09-02: 09:00:00 via INTRAVENOUS

## 2014-09-02 MED ORDER — CYCLOPENTOLATE-PHENYLEPHRINE 0.2-1 % OP SOLN
1.0000 [drp] | OPHTHALMIC | Status: AC
  Administered 2014-09-02 (×3): 1 [drp] via OPHTHALMIC

## 2014-09-02 MED ORDER — PHENYLEPHRINE HCL 2.5 % OP SOLN
1.0000 [drp] | OPHTHALMIC | Status: AC
  Administered 2014-09-02 (×3): 1 [drp] via OPHTHALMIC

## 2014-09-02 MED ORDER — FENTANYL CITRATE 0.05 MG/ML IJ SOLN
25.0000 ug | INTRAMUSCULAR | Status: AC
  Administered 2014-09-02: 25 ug via INTRAVENOUS

## 2014-09-02 MED ORDER — TETRACAINE HCL 0.5 % OP SOLN
1.0000 [drp] | OPHTHALMIC | Status: AC
  Administered 2014-09-02 (×3): 1 [drp] via OPHTHALMIC

## 2014-09-02 MED ORDER — FENTANYL CITRATE 0.05 MG/ML IJ SOLN
INTRAMUSCULAR | Status: AC
  Filled 2014-09-02: qty 2

## 2014-09-02 MED ORDER — POVIDONE-IODINE 5 % OP SOLN
OPHTHALMIC | Status: DC | PRN
  Administered 2014-09-02: 1 via OPHTHALMIC

## 2014-09-02 MED ORDER — PROVISC 10 MG/ML IO SOLN
INTRAOCULAR | Status: DC | PRN
  Administered 2014-09-02: 0.85 mL via INTRAOCULAR

## 2014-09-02 MED ORDER — MIDAZOLAM HCL 2 MG/2ML IJ SOLN
1.0000 mg | INTRAMUSCULAR | Status: DC | PRN
  Administered 2014-09-02: 2 mg via INTRAVENOUS

## 2014-09-02 SURGICAL SUPPLY — 11 items
CLOTH BEACON ORANGE TIMEOUT ST (SAFETY) ×2 IMPLANT
EYE SHIELD UNIVERSAL CLEAR (GAUZE/BANDAGES/DRESSINGS) ×2 IMPLANT
GLOVE BIOGEL PI IND STRL 7.5 (GLOVE) IMPLANT
GLOVE BIOGEL PI INDICATOR 7.5 (GLOVE) ×6
GLOVE EXAM NITRILE LRG STRL (GLOVE) ×2 IMPLANT
PAD ARMBOARD 7.5X6 YLW CONV (MISCELLANEOUS) ×2 IMPLANT
SIGHTPATH CAT PROC W REG LENS (Ophthalmic Related) ×3 IMPLANT
SYRINGE LUER LOK 1CC (MISCELLANEOUS) ×2 IMPLANT
TAPE SURG TRANSPORE 1 IN (GAUZE/BANDAGES/DRESSINGS) IMPLANT
TAPE SURGICAL TRANSPORE 1 IN (GAUZE/BANDAGES/DRESSINGS) ×2
WATER STERILE IRR 250ML POUR (IV SOLUTION) ×2 IMPLANT

## 2014-09-02 NOTE — Discharge Instructions (Signed)

## 2014-09-02 NOTE — H&P (Signed)
I have reviewed the H&P, the patient was re-examined, and I have identified no interval changes in medical condition and plan of care since the history and physical of record  

## 2014-09-02 NOTE — Anesthesia Postprocedure Evaluation (Signed)
  Anesthesia Post-op Note  Patient: Selena [name]  Procedure(s) Performed: Procedure(s) with comments: CATARACT EXTRACTION PHACO AND INTRAOCULAR LENS PLACEMENT RIGHT EYE (Right) - CDE:8.96  Patient Location: PACU  Anesthesia Type:MAC  Level of Consciousness: awake, alert  and oriented  Airway and Oxygen Therapy: Patient Spontanous Breathing  Post-op Pain: none  Post-op Assessment: Post-op Vital signs reviewed, Patient's Cardiovascular Status Stable, Respiratory Function Stable, Patent Airway and No signs of Nausea or vomiting  Post-op Vital Signs: Reviewed and stable  Last Vitals:  Filed Vitals:   09/02/14 0940  BP: 119/63  Pulse:   Temp:   Resp: 17    Complications: No apparent anesthesia complications

## 2014-09-02 NOTE — Transfer of Care (Signed)
Immediate Anesthesia Transfer of Care Note  Patient: Selena [name]  Procedure(s) Performed: Procedure(s) with comments: CATARACT EXTRACTION PHACO AND INTRAOCULAR LENS PLACEMENT RIGHT EYE (Right) - CDE:8.96  Patient Location: Short Stay  Anesthesia Type:MAC  Level of Consciousness: awake  Airway & Oxygen Therapy: Patient Spontanous Breathing  Post-op Assessment: Report given to PACU RN  Post vital signs: Reviewed  Last Vitals:  Filed Vitals:   09/02/14 0940  BP: 119/63  Pulse:   Temp:   Resp: 17    Complications: No apparent anesthesia complications

## 2014-09-02 NOTE — Op Note (Signed)
Date of Admission: 09/02/2014  Date of Surgery: 09/02/2014   Pre-Op Dx: Cataract Right Eye  Post-Op Dx: Senile Combined Cataract Right  Eye,  Dx Code H60.737  Surgeon: Tonny Branch, M.D.  Assistants: None  Anesthesia: Topical with MAC  Indications: Painless, progressive loss of vision with compromise of daily activities.  Surgery: Cataract Extraction with Intraocular lens Implant Right Eye  Discription: The patient had dilating drops and viscous lidocaine placed into the Right eye in the pre-op holding area. After transfer to the operating room, a time out was performed. The patient was then prepped and draped. Beginning with a 75 degree blade a paracentesis port was made at the surgeon's 2 o'clock position. The anterior chamber was then filled with 1% non-preserved lidocaine. This was followed by filling the anterior chamber with Provisc.  A 2.6mm keratome blade was used to make a clear corneal incision at the temporal limbus.  A bent cystatome needle was used to create a continuous tear capsulotomy. Hydrodissection was performed with balanced salt solution on a Fine canula. The lens nucleus was then removed using the phacoemulsification handpiece. Residual cortex was removed with the I&A handpiece. The anterior chamber and capsular bag were refilled with Provisc. A posterior chamber intraocular lens was placed into the capsular bag with it's injector. The implant was positioned with the Kuglan hook. The Provisc was then removed from the anterior chamber and capsular bag with the I&A handpiece. Stromal hydration of the main incision and paracentesis port was performed with BSS on a Fine canula. The wounds were tested for leak which was negative. The patient tolerated the procedure well. There were no operative complications. The patient was then transferred to the recovery room in stable condition.  Complications: None  Specimen: None  EBL: None  Prosthetic device: Hoya iSert 250, power 21.5  D, SN [id number].

## 2014-09-02 NOTE — Anesthesia Preprocedure Evaluation (Signed)
Anesthesia Evaluation  Patient identified by MRN, date of birth, ID band Patient awake    Reviewed: Allergy & Precautions, NPO status , Patient's Chart, lab work & pertinent test results, reviewed documented beta blocker date and time   History of Anesthesia Complications (+) history of anesthetic complications ("woke up during surgery")  Airway Mallampati: II  TM Distance: >3 FB     Dental  (+) Teeth Intact   Pulmonary shortness of breath, former smoker,  breath sounds clear to auscultation        Cardiovascular hypertension, Pt. on medications and Pt. on home beta blockers + CAD and + Past MI + dysrhythmias Atrial Fibrillation Rhythm:Regular Rate:Normal     Neuro/Psych PSYCHIATRIC DISORDERS Depression TIACVA    GI/Hepatic hiatal hernia, PUD, GERD-  ,  Endo/Other  Hypothyroidism   Renal/GU      Musculoskeletal  (+) Arthritis -,   Abdominal   Peds  Hematology   Anesthesia Other Findings   Reproductive/Obstetrics                             Anesthesia Physical Anesthesia Plan  ASA: III  Anesthesia Plan: MAC   Post-op Pain Management:    Induction: Intravenous  Airway Management Planned: Nasal Cannula  Additional Equipment:   Intra-op Plan:   Post-operative Plan:   Informed Consent: I have reviewed the patients History and Physical, chart, labs and discussed the procedure including the risks, benefits and alternatives for the proposed anesthesia with the patient or authorized representative who has indicated his/her understanding and acceptance.     Plan Discussed with:   Anesthesia Plan Comments:         Anesthesia Quick Evaluation

## 2014-09-03 ENCOUNTER — Encounter (HOSPITAL_COMMUNITY): Payer: Self-pay | Admitting: Ophthalmology

## 2014-09-07 ENCOUNTER — Encounter (HOSPITAL_COMMUNITY): Payer: Self-pay

## 2014-09-07 ENCOUNTER — Encounter (HOSPITAL_COMMUNITY)
Admission: RE | Admit: 2014-09-07 | Discharge: 2014-09-07 | Disposition: A | Discharge status: Home / Self Care | Payer: BLUE CROSS/BLUE SHIELD | Source: Ambulatory Visit | Attending: Ophthalmology | Admitting: Ophthalmology

## 2014-09-10 MED ORDER — LIDOCAINE HCL (PF) 1 % IJ SOLN
INTRAMUSCULAR | Status: AC
  Filled 2014-09-10: qty 2

## 2014-09-10 MED ORDER — NEOMYCIN-POLYMYXIN-DEXAMETH 3.5-10000-0.1 OP SUSP
OPHTHALMIC | Status: AC
  Filled 2014-09-10: qty 5

## 2014-09-10 MED ORDER — CYCLOPENTOLATE-PHENYLEPHRINE OP SOLN OPTIME - NO CHARGE
OPHTHALMIC | Status: AC
  Filled 2014-09-10: qty 2

## 2014-09-10 MED ORDER — PHENYLEPHRINE HCL 2.5 % OP SOLN
OPHTHALMIC | Status: AC
  Filled 2014-09-10: qty 15

## 2014-09-10 MED ORDER — LIDOCAINE HCL 3.5 % OP GEL
OPHTHALMIC | Status: AC
  Filled 2014-09-10: qty 1

## 2014-09-10 MED ORDER — TETRACAINE HCL 0.5 % OP SOLN
OPHTHALMIC | Status: AC
  Filled 2014-09-10: qty 2

## 2014-09-13 ENCOUNTER — Ambulatory Visit (HOSPITAL_COMMUNITY): Payer: BLUE CROSS/BLUE SHIELD | Admitting: Anesthesiology

## 2014-09-13 ENCOUNTER — Encounter (HOSPITAL_COMMUNITY): Payer: Self-pay | Admitting: Ophthalmology

## 2014-09-13 ENCOUNTER — Encounter (HOSPITAL_COMMUNITY)
Admission: RE | Disposition: A | Discharge status: Home / Self Care | Payer: Self-pay | Source: Ambulatory Visit | Attending: Ophthalmology

## 2014-09-13 ENCOUNTER — Ambulatory Visit (HOSPITAL_COMMUNITY)
Admission: RE | Admit: 2014-09-13 | Discharge: 2014-09-13 | Disposition: A | Discharge status: Home / Self Care | Payer: BLUE CROSS/BLUE SHIELD | Source: Ambulatory Visit | Attending: Ophthalmology | Admitting: Ophthalmology

## 2014-09-13 DIAGNOSIS — H25812 Combined forms of age-related cataract, left eye: Secondary | ICD-10-CM | POA: Insufficient documentation

## 2014-09-13 HISTORY — PX: CATARACT EXTRACTION W/PHACO: SHX586

## 2014-09-13 SURGERY — PHACOEMULSIFICATION, CATARACT, WITH IOL INSERTION
Anesthesia: Monitor Anesthesia Care | Site: Eye | Laterality: Left

## 2014-09-13 MED ORDER — LIDOCAINE 3.5 % OP GEL OPTIME - NO CHARGE
OPHTHALMIC | Status: DC | PRN
  Administered 2014-09-13: 2 [drp] via OPHTHALMIC

## 2014-09-13 MED ORDER — PROVISC 10 MG/ML IO SOLN
INTRAOCULAR | Status: DC | PRN
  Administered 2014-09-13: 0.85 mL via INTRAOCULAR

## 2014-09-13 MED ORDER — POVIDONE-IODINE 5 % OP SOLN
OPHTHALMIC | Status: DC | PRN
  Administered 2014-09-13: 1 via OPHTHALMIC

## 2014-09-13 MED ORDER — FENTANYL CITRATE 0.05 MG/ML IJ SOLN
INTRAMUSCULAR | Status: AC
  Filled 2014-09-13: qty 2

## 2014-09-13 MED ORDER — CYCLOPENTOLATE-PHENYLEPHRINE 0.2-1 % OP SOLN
1.0000 [drp] | OPHTHALMIC | Status: AC
  Administered 2014-09-13 (×3): 1 [drp] via OPHTHALMIC

## 2014-09-13 MED ORDER — TETRACAINE HCL 0.5 % OP SOLN
1.0000 [drp] | OPHTHALMIC | Status: AC
  Administered 2014-09-13 (×3): 1 [drp] via OPHTHALMIC

## 2014-09-13 MED ORDER — PHENYLEPHRINE HCL 2.5 % OP SOLN
1.0000 [drp] | OPHTHALMIC | Status: AC
  Administered 2014-09-13 (×3): 1 [drp] via OPHTHALMIC

## 2014-09-13 MED ORDER — MIDAZOLAM HCL 2 MG/2ML IJ SOLN
1.0000 mg | INTRAMUSCULAR | Status: DC | PRN
  Administered 2014-09-13: 2 mg via INTRAVENOUS

## 2014-09-13 MED ORDER — NEOMYCIN-POLYMYXIN-DEXAMETH 3.5-10000-0.1 OP SUSP
OPHTHALMIC | Status: DC | PRN
  Administered 2014-09-13: 2 [drp] via OPHTHALMIC

## 2014-09-13 MED ORDER — EPINEPHRINE HCL 1 MG/ML IJ SOLN
INTRAOCULAR | Status: DC | PRN
  Administered 2014-09-13: 500 mL

## 2014-09-13 MED ORDER — EPINEPHRINE HCL 1 MG/ML IJ SOLN
INTRAMUSCULAR | Status: AC
  Filled 2014-09-13: qty 1

## 2014-09-13 MED ORDER — LACTATED RINGERS IV SOLN
INTRAVENOUS | Status: DC
  Administered 2014-09-13: 12:00:00 via INTRAVENOUS

## 2014-09-13 MED ORDER — LIDOCAINE HCL 3.5 % OP GEL: 1.0000 "application " | Freq: Once | OPHTHALMIC | Status: DC

## 2014-09-13 MED ORDER — FENTANYL CITRATE 0.05 MG/ML IJ SOLN
25.0000 ug | INTRAMUSCULAR | Status: AC
  Administered 2014-09-13 (×2): 25 ug via INTRAVENOUS

## 2014-09-13 MED ORDER — LIDOCAINE HCL (PF) 1 % IJ SOLN
INTRAMUSCULAR | Status: DC | PRN
  Administered 2014-09-13: .5 mL

## 2014-09-13 MED ORDER — MIDAZOLAM HCL 2 MG/2ML IJ SOLN
INTRAMUSCULAR | Status: AC
Start: 2014-09-13 — End: 2014-09-13
  Filled 2014-09-13: qty 2

## 2014-09-13 MED ORDER — BSS IO SOLN
INTRAOCULAR | Status: DC | PRN
  Administered 2014-09-13: 15 mL

## 2014-09-13 SURGICAL SUPPLY — 16 items
CLOTH BEACON ORANGE TIMEOUT ST (SAFETY) ×2 IMPLANT
EYE SHIELD UNIVERSAL CLEAR (GAUZE/BANDAGES/DRESSINGS) ×2 IMPLANT
GLOVE BIOGEL PI IND STRL 6.5 (GLOVE) IMPLANT
GLOVE BIOGEL PI IND STRL 7.0 (GLOVE) IMPLANT
GLOVE BIOGEL PI INDICATOR 6.5 (GLOVE) ×2
GLOVE BIOGEL PI INDICATOR 7.0 (GLOVE) ×2
GLOVE EXAM NITRILE MD LF STRL (GLOVE) ×2 IMPLANT
GLOVE SURG SS PI 7.5 STRL IVOR (GLOVE) ×2 IMPLANT
GOWN STRL REUS W/ TWL LRG LVL3 (GOWN DISPOSABLE) IMPLANT
GOWN STRL REUS W/TWL LRG LVL3 (GOWN DISPOSABLE) ×3
PAD ARMBOARD 7.5X6 YLW CONV (MISCELLANEOUS) ×2 IMPLANT
SIGHTPATH CAT PROC W REG LENS (Ophthalmic Related) ×3 IMPLANT
SYRINGE LUER LOK 1CC (MISCELLANEOUS) ×2 IMPLANT
TAPE SURG TRANSPORE 1 IN (GAUZE/BANDAGES/DRESSINGS) IMPLANT
TAPE SURGICAL TRANSPORE 1 IN (GAUZE/BANDAGES/DRESSINGS) ×2
WATER STERILE IRR 250ML POUR (IV SOLUTION) ×2 IMPLANT

## 2014-09-13 NOTE — Op Note (Signed)
Date of Admission: 09/13/2014  Date of Surgery: 09/13/2014   Pre-Op Dx: Cataract Left Eye  Post-Op Dx: Senile Combined Cataract Left  Eye,  Dx Code M03.491  Surgeon: Tonny Branch, M.D.  Assistants: None  Anesthesia: Topical with MAC  Indications: Painless, progressive loss of vision with compromise of daily activities.  Surgery: Cataract Extraction with Intraocular lens Implant Left Eye  Discription: The patient had dilating drops and viscous lidocaine placed into the Left eye in the pre-op holding area. After transfer to the operating room, a time out was performed. The patient was then prepped and draped. Beginning with a 60 degree blade a paracentesis port was made at the surgeon's 2 o'clock position. The anterior chamber was then filled with 1% non-preserved lidocaine. This was followed by filling the anterior chamber with Provisc.  A 2.36mm keratome blade was used to make a clear corneal incision at the temporal limbus.  A bent cystatome needle was used to create a continuous tear capsulotomy. Hydrodissection was performed with balanced salt solution on a Fine canula. The lens nucleus was then removed using the phacoemulsification handpiece. Residual cortex was removed with the I&A handpiece. The anterior chamber and capsular bag were refilled with Provisc. A posterior chamber intraocular lens was placed into the capsular bag with it's injector. The implant was positioned with the Kuglan hook. The Provisc was then removed from the anterior chamber and capsular bag with the I&A handpiece. Stromal hydration of the main incision and paracentesis port was performed with BSS on a Fine canula. The wounds were tested for leak which was negative. The patient tolerated the procedure well. There were no operative complications. The patient was then transferred to the recovery room in stable condition.  Complications: None  Specimen: None  EBL: None  Prosthetic device: Hoya iSert 250, power 20.5 D, SN  [id number].

## 2014-09-13 NOTE — H&P (Signed)
I have reviewed the H&P, the patient was re-examined, and I have identified no interval changes in medical condition and plan of care since the history and physical of record  

## 2014-09-13 NOTE — Anesthesia Preprocedure Evaluation (Signed)
Anesthesia Evaluation  Patient identified by MRN, date of birth, ID band Patient awake    Reviewed: Allergy & Precautions, NPO status , Patient's Chart, lab work & pertinent test results, reviewed documented beta blocker date and time   History of Anesthesia Complications (+) history of anesthetic complications ("woke up during surgery")  Airway Mallampati: II  TM Distance: >3 FB     Dental  (+) Teeth Intact   Pulmonary shortness of breath, former smoker,  breath sounds clear to auscultation        Cardiovascular hypertension, Pt. on medications and Pt. on home beta blockers + CAD and + Past MI + dysrhythmias Atrial Fibrillation Rhythm:Regular Rate:Normal     Neuro/Psych PSYCHIATRIC DISORDERS Depression TIACVA    GI/Hepatic hiatal hernia, PUD, GERD-  ,  Endo/Other  Hypothyroidism   Renal/GU      Musculoskeletal  (+) Arthritis -,   Abdominal   Peds  Hematology   Anesthesia Other Findings   Reproductive/Obstetrics                             Anesthesia Physical Anesthesia Plan  ASA: III  Anesthesia Plan: MAC   Post-op Pain Management:    Induction: Intravenous  Airway Management Planned: Nasal Cannula  Additional Equipment:   Intra-op Plan:   Post-operative Plan:   Informed Consent: I have reviewed the patients History and Physical, chart, labs and discussed the procedure including the risks, benefits and alternatives for the proposed anesthesia with the patient or authorized representative who has indicated his/her understanding and acceptance.     Plan Discussed with:   Anesthesia Plan Comments:         Anesthesia Quick Evaluation

## 2014-09-13 NOTE — Discharge Instructions (Signed)

## 2014-09-13 NOTE — Addendum Note (Signed)
Addendum  created 09/13/14 1230 by Mickel Baas, CRNA   Modules edited: Charges VN

## 2014-09-13 NOTE — Transfer of Care (Signed)
Immediate Anesthesia Transfer of Care Note  Patient: Selena [name]  Procedure(s) Performed: Procedure(s) with comments: CATARACT EXTRACTION PHACO AND INTRAOCULAR LENS PLACEMENT LEFT EYE (Left) - CDE 6.20  Patient Location: shortstay  Anesthesia Type:MAC  Level of Consciousness: awake, alert , oriented and patient cooperative  Airway & Oxygen Therapy: Patient Spontanous Breathing  Post-op Assessment: Post -op Vital signs reviewed and stable  Post vital signs: Reviewed and stable  Last Vitals:  Filed Vitals:   09/13/14 1146  BP: 120/61  Pulse: 93  Temp:   Resp: 21    Complications: No apparent anesthesia complications

## 2014-09-13 NOTE — Anesthesia Postprocedure Evaluation (Signed)
  Anesthesia Post-op Note  Patient: Selena [name]  Procedure(s) Performed: Procedure(s) with comments: CATARACT EXTRACTION PHACO AND INTRAOCULAR LENS PLACEMENT LEFT EYE (Left) - CDE 6.20  Patient Location: shortstay  Anesthesia Type:MAC  Level of Consciousness: awake, alert , oriented and patient cooperative  Airway and Oxygen Therapy: Patient Spontanous Breathing  Post-op Pain: none  Post-op Assessment: Post-op Vital signs reviewed, Patient's Cardiovascular Status Stable, Respiratory Function Stable, Patent Airway, No signs of Nausea or vomiting and Pain level controlled  Post-op Vital Signs: Reviewed and stable  Last Vitals:  Filed Vitals:   09/13/14 1146  BP: 120/61  Pulse: 93  Temp:   Resp: 21    Complications: No apparent anesthesia complications

## 2014-09-14 ENCOUNTER — Encounter (HOSPITAL_COMMUNITY): Payer: Self-pay | Admitting: Ophthalmology

## 2014-11-04 ENCOUNTER — Other Ambulatory Visit (HOSPITAL_COMMUNITY): Payer: Self-pay | Admitting: Radiology

## 2014-11-04 DIAGNOSIS — R0683 Snoring: Secondary | ICD-10-CM

## 2014-11-30 ENCOUNTER — Ambulatory Visit: Payer: BLUE CROSS/BLUE SHIELD | Attending: Physical Medicine and Rehabilitation | Admitting: Sleep Medicine

## 2014-11-30 DIAGNOSIS — R0683 Snoring: Secondary | ICD-10-CM | POA: Insufficient documentation

## 2014-12-03 NOTE — Sleep Study (Signed)
[place] A. Merlene Laughter, MD     [email or web address]        NOCTURNAL POLYSOMNOGRAM    LOCATION: SLEEP LAB FACILITY: [facility]   PHYSICIAN: Archie Shea A. Merlene Laughter, M.D.   DATE OF STUDY: 11/30/2014.   REFERRING PHYSICIAN: Margaretha Sheffield.   INDICATIONS: The patient is a 64-year-old female who presents with snoring, if currently sleeping, weight gain and daytime fatigue.  MEDICATIONS:  Prior to Admission medications   Medication Sig Start Date End Date Taking? Authorizing Provider  cyclobenzaprine (FLEXERIL) 10 MG tablet Take 10 mg by mouth 3 (three) times daily as needed for muscle spasms.  12/10/10   Historical Provider, MD  Fluticasone-Salmeterol (ADVAIR) 250-50 MCG/DOSE AEPB Inhale 1 puff into the lungs 2 (two) times daily.    Historical Provider, MD  furosemide (LASIX) 40 MG tablet Take 40 mg by mouth daily.  11/02/10   Historical Provider, MD  gabapentin (NEURONTIN) 600 MG tablet Take 600 mg by mouth 2 (two) times daily.     Historical Provider, MD  levothyroxine (SYNTHROID, LEVOTHROID) 137 MCG tablet Take 137 mcg by mouth daily before breakfast.    Historical Provider, MD  losartan (COZAAR) 100 MG tablet Take 100 mg by mouth daily.    Historical Provider, MD  methadone (DOLOPHINE) 10 MG tablet Take 10 mg by mouth 2 (two) times daily.  11/27/10   Historical Provider, MD  metoprolol tartrate (LOPRESSOR) 25 MG tablet Take 25 mg by mouth 2 (two) times daily.  11/23/10   Historical Provider, MD  nitroGLYCERIN (NITROSTAT) 0.4 MG SL tablet Place 0.4 mg under the tongue every 5 (five) minutes as needed for chest pain.     Historical Provider, MD  OxyCODONE (OXYCONTIN) 10 mg T12A 12 hr tablet Take 1 tablet (10 mg total) by mouth every 12 (twelve) hours. Patient not taking: Reported on 08/25/2014 04/03/14   Chriss Czar, PA-C  oxyCODONE-acetaminophen (ROXICET) 5-325 MG per tablet Take 1-2 tablets by mouth every 4 (four) hours as needed for severe pain. Patient not taking: Reported  on 08/25/2014 04/03/14   Chriss Czar, PA-C  pantoprazole (PROTONIX) 40 MG tablet Take 40 mg by mouth daily.  11/16/10   Historical Provider, MD  potassium chloride (K-DUR) 10 MEQ tablet Take 10 mEq by mouth 2 (two) times daily.     Historical Provider, MD  rivaroxaban (XARELTO) 10 MG TABS tablet Take 1 tablet (10 mg total) by mouth daily. Patient not taking: Reported on 08/25/2014 04/03/14   Chriss Czar, PA-C  simvastatin (ZOCOR) 40 MG tablet Take 40 mg by mouth daily.  11/24/10   Historical Provider, MD  Tetrahydrozoline HCl (VISINE OP) Place 1 drop into both eyes at bedtime as needed (dry eyes).    Historical Provider, MD  triamcinolone cream (KENALOG) 0.1 % Apply 1 application topically 2 (two) times daily. Apply to leg    Historical Provider, MD  zolpidem (AMBIEN) 5 MG tablet Take 5 mg by mouth at bedtime. 02/13/14   Historical Provider, MD      EPWORTH SLEEPINESS SCALE: 7.   BMI: 36.   ARCHITECTURAL SUMMARY: Total recording time was 368 minutes. Sleep efficiency 82 %. Sleep latency 16 minutes. REM latency 105 minutes. Stage NI 5 %, N2 77 % and N3 2 % and REM sleep 16 %.    RESPIRATORY DATA:  Baseline oxygen saturation is 94 %. The lowest saturation is 76 %. The diagnostic AHI is 5. The RDI is 5. The REM AHI is 10. The patient had prolonged episodes of  hypoventilation and saturation without obstructive events. Minutes spent below 88% is 104.  LIMB MOVEMENT SUMMARY: PLM index 0.   ELECTROCARDIOGRAM SUMMARY: Average heart rate is 77 with no significant dysrhythmias observed.   IMPRESSION:  1. Hypoventilation syndrome. Two L of nocturnal submental oxygen suggested. 2. Mild obstructive sleep apnea syndrome not requiring positive pressure treatment.  Thanks for this referral.  Pablo Mathurin A. Merlene Laughter, M.D. Diplomat, Tax adviser of Sleep Medicine.

## 2015-03-22 ENCOUNTER — Ambulatory Visit: Payer: BLUE CROSS/BLUE SHIELD | Admitting: Gastroenterology

## 2015-05-24 ENCOUNTER — Encounter: Payer: Self-pay | Admitting: Adult Health

## 2015-05-24 ENCOUNTER — Ambulatory Visit (INDEPENDENT_AMBULATORY_CARE_PROVIDER_SITE_OTHER): Payer: BLUE CROSS/BLUE SHIELD | Admitting: Adult Health

## 2015-05-24 VITALS — BP 140/70 | HR 82 | Ht 67.0 in | Wt 239.0 lb

## 2015-05-24 DIAGNOSIS — Z1212 Encounter for screening for malignant neoplasm of rectum: Secondary | ICD-10-CM

## 2015-05-24 DIAGNOSIS — N993 Prolapse of vaginal vault after hysterectomy: Secondary | ICD-10-CM | POA: Diagnosis not present

## 2015-05-24 DIAGNOSIS — R102 Pelvic and perineal pain: Secondary | ICD-10-CM | POA: Insufficient documentation

## 2015-05-24 HISTORY — DX: Prolapse of vaginal vault after hysterectomy: N99.3

## 2015-05-24 LAB — HEMOCCULT GUIAC POC 1CARD (OFFICE): FECAL OCCULT BLD: NEGATIVE

## 2015-05-24 NOTE — Patient Instructions (Signed)
Return in 1 day for [place] Then 2-3 days for pessary fitting with Dr Elonda Husky

## 2015-05-24 NOTE — Progress Notes (Signed)
Subjective:     Patient ID: Selena [name], female   DOB: 12-26-1950, 64 y.o.   MRN: [id number]  HPI Selena [name] is a 64 year old white female,married in complaining of pelvic pain and "feels like something falling out",had hysterectomy in 1983 for cervical cancer. She goes to pain clinic in [place] and is on Methadone for chronic back pain.She says she voids often and takes meds to help with bowel movements. PCP is Dr Willey Blade, and sees Dr Oneida Alar for GI.  Review of Systems + pain with standing,feels like something falling out, has chronic back pain and voids often,takes meds for BMs, all other systems negative Reviewed past medical,surgical, social and family history. Reviewed medications and allergies.     Objective:   Physical Exam BP 140/70 mmHg  Pulse 82  Ht 5\' 7"  (1.702 m)  Wt 239 lb (108.41 kg)  BMI 37.42 kg/m2 Skin warm and dry.Pelvic: external genitalia is normal in appearance no lesions, vagina:is pale with loss of moisture and rugae and has vaginal vault prolapse,urethra has no lesions or masses noted, cervix and uterus are absent, adnexa: no masses, bilateral tenderness noted. Bladder is non tender and no masses felt.On rectal exam has hemorrhoids,hemoccult was negative and no rectocele noted.   Dr Elonda Husky in for co exam, discussed getting [place] to evaluate pain and then come back for pessary fitting with Dr Elonda Husky, described pessary with pt. Face time 20 minutes with pt with 50 % counseling.  Assessment:     Vaginal vault prolapse Pelvic pain    Plan:     Return in 1 day for pelvic US then in 2-3 days for pessary fitting with Dr Elonda Husky

## 2015-05-25 ENCOUNTER — Ambulatory Visit (INDEPENDENT_AMBULATORY_CARE_PROVIDER_SITE_OTHER): Payer: BLUE CROSS/BLUE SHIELD | Admitting: Gastroenterology

## 2015-05-25 ENCOUNTER — Other Ambulatory Visit: Payer: Self-pay

## 2015-05-25 ENCOUNTER — Encounter: Payer: Self-pay | Admitting: Gastroenterology

## 2015-05-25 ENCOUNTER — Ambulatory Visit (INDEPENDENT_AMBULATORY_CARE_PROVIDER_SITE_OTHER): Payer: BLUE CROSS/BLUE SHIELD

## 2015-05-25 VITALS — BP 104/64 | HR 50 | Temp 98.3°F | Ht 66.0 in | Wt 241.2 lb

## 2015-05-25 DIAGNOSIS — R1310 Dysphagia, unspecified: Secondary | ICD-10-CM | POA: Diagnosis not present

## 2015-05-25 DIAGNOSIS — R102 Pelvic and perineal pain: Secondary | ICD-10-CM | POA: Diagnosis not present

## 2015-05-25 DIAGNOSIS — Z9889 Other specified postprocedural states: Secondary | ICD-10-CM | POA: Diagnosis not present

## 2015-05-25 DIAGNOSIS — K219 Gastro-esophageal reflux disease without esophagitis: Secondary | ICD-10-CM

## 2015-05-25 MED ORDER — PANTOPRAZOLE SODIUM 40 MG PO TBEC: 40.0000 mg | DELAYED_RELEASE_TABLET | Freq: Two times a day (BID) | ORAL | Status: DC

## 2015-05-25 NOTE — Progress Notes (Signed)
PELVIC US TV: normal vag cuff,normal rt ov,lt ov contains two simple cysts (#1) 1.7 x 1.9 x 1.7 cm, (#2) 2.8 x 1.5 x 1.8 cm (mobile),no free fluid,bilat adnexal pain during [place]

## 2015-05-25 NOTE — Patient Instructions (Signed)
COMPLETE BARIUM PILL ESOPHAGRAM WITHIN 7 DAYS.  CONTINUE YOUR WEIGHT LOSS EFFORTS. LOSE 10 LBS.  AVOID TRIGGERS FOR REFLUX. SEE INFO BELOW.  FOLLOW A LOW FAT DIET. MEATS SHOULD BE BAKED, BROILED, OR BOILED.  MEATS SHOULD BE CHOPPED OR GROUND ONLY. DO NOT EAT CHUNKS OF ANYTHING.   INCREASE PROTONIX. TAKE 30 MINUTES PRIOR TO MEALS TWICE DAILY.  FOLLOW UP IN 4 MOS.    Lifestyle and home remedies TO MANAGE REFLUX/REGURGITATION  You may eliminate or reduce the frequency of heartburn by making the following lifestyle changes:  . Control your weight. Being overweight is a major risk factor for heartburn and GERD. Excess pounds put pressure on your abdomen, pushing up your stomach and causing acid to back up into your esophagus.   . Eat smaller meals. 4 TO 6 MEALS A DAY. This reduces pressure on the lower esophageal sphincter, helping to prevent the valve from opening and acid from washing back into your esophagus.   Dolphus Jenny your belt. Clothes that fit tightly around your waist put pressure on your abdomen and the lower esophageal sphincter.   . Eliminate heartburn triggers. Everyone has specific triggers. Common triggers such as fatty or fried foods, spicy food, tomato sauce, carbonated beverages, alcohol, chocolate, mint, garlic, onion, caffeine and nicotine may make heartburn worse.   Marland Kitchen Avoid stooping or bending. Tying your shoes is OK. Bending over for longer periods to weed your garden isn't, especially soon after eating.   . Don't lie down after a meal. Wait at least three to four hours after eating before going to bed, and don't lie down right after eating.   Marland Kitchen PUT THE HEAD OF YOUR BED ON 6 INCH BLOCKS.   Alternative medicine . Several home remedies exist for treating GERD, but they provide only temporary relief. They include drinking baking soda (sodium bicarbonate) added to water or drinking other fluids such as baking soda mixed with cream of tartar and water.  . Although  these liquids create temporary relief by neutralizing, washing away or buffering acids, eventually they aggravate the situation by adding gas and fluid to your stomach, increasing pressure and causing more acid reflux. Further, adding more sodium to your diet may increase your blood pressure and add stress to your heart, and excessive bicarbonate ingestion can alter the acid-base balance in your body.      Low-Fat Diet BREADS, CEREALS, PASTA, RICE, DRIED PEAS, AND BEANS These products are high in carbohydrates and most are low in fat. Therefore, they can be increased in the diet as substitutes for fatty foods. They too, however, contain calories and should not be eaten in excess. Cereals can be eaten for snacks as well as for breakfast.   FRUITS AND VEGETABLES It is good to eat fruits and vegetables. Besides being sources of fiber, both are rich in vitamins and some minerals. They help you get the daily allowances of these nutrients. Fruits and vegetables can be used for snacks and desserts.  MEATS Limit lean meat, chicken, [place], and fish to no more than 6 ounces per day. Beef, Pork, and Lamb Use lean cuts of beef, pork, and lamb. Lean cuts include:  Extra-lean ground beef.  Arm roast.  Sirloin tip.  Center-cut ham.  Round steak.  Loin chops.  Rump roast.  Tenderloin.  Trim all fat off the outside of meats before cooking. It is not necessary to severely decrease the intake of red meat, but lean choices should be made. Lean meat is rich  in protein and contains a highly absorbable form of iron. Premenopausal women, in particular, should avoid reducing lean red meat because this could increase the risk for low red blood cells (iron-deficiency anemia).  Chicken and [place] These are good sources of protein. The fat of poultry can be reduced by removing the skin and underlying fat layers before cooking. Chicken and [place] can be substituted for lean red meat in the diet. Poultry should not be  fried or covered with high-fat sauces. Fish and Shellfish Fish is a good source of protein. Shellfish contain cholesterol, but they usually are low in saturated fatty acids. The preparation of fish is important. Like chicken and [place], they should not be fried or covered with high-fat sauces. EGGS Egg whites contain no fat or cholesterol. They can be eaten often. Try 1 to 2 egg whites instead of whole eggs in recipes or use egg substitutes that do not contain yolk. MILK AND DAIRY PRODUCTS Use skim or 1% milk instead of 2% or whole milk. Decrease whole milk, natural, and processed cheeses. Use nonfat or low-fat (2%) cottage cheese or low-fat cheeses made from vegetable oils. Choose nonfat or low-fat (1 to 2%) yogurt. Experiment with evaporated skim milk in recipes that call for heavy cream. Substitute low-fat yogurt or low-fat cottage cheese for sour cream in dips and salad dressings. Have at least 2 servings of low-fat dairy products, such as 2 glasses of skim (or 1%) milk each day to help get your daily calcium intake. FATS AND OILS Reduce the total intake of fats, especially saturated fat. Butterfat, lard, and beef fats are high in saturated fat and cholesterol. These should be avoided as much as possible. Vegetable fats do not contain cholesterol, but certain vegetable fats, such as coconut oil, palm oil, and palm kernel oil are very high in saturated fats. These should be limited. These fats are often used in bakery goods, processed foods, popcorn, oils, and nondairy creamers. Vegetable shortenings and some peanut butters contain hydrogenated oils, which are also saturated fats. Read the labels on these foods and check for saturated vegetable oils. Unsaturated vegetable oils and fats do not raise blood cholesterol. However, they should be limited because they are fats and are high in calories. Total fat should still be limited to 30% of your daily caloric intake. Desirable liquid vegetable oils are  corn oil, cottonseed oil, olive oil, canola oil, safflower oil, soybean oil, and sunflower oil. Peanut oil is not as good, but small amounts are acceptable. Buy a heart-healthy tub margarine that has no partially hydrogenated oils in the ingredients. Mayonnaise and salad dressings often are made from unsaturated fats, but they should also be limited because of their high calorie and fat content. Seeds, nuts, peanut butter, olives, and avocados are high in fat, but the fat is mainly the unsaturated type. These foods should be limited mainly to avoid excess calories and fat. OTHER EATING TIPS Snacks  Most sweets should be limited as snacks. They tend to be rich in calories and fats, and their caloric content outweighs their nutritional value. Some good choices in snacks are graham crackers, melba toast, soda crackers, bagels (no egg), English muffins, fruits, and vegetables. These snacks are preferable to snack crackers, [place] fries, TORTILLA CHIPS, and POTATO chips. Popcorn should be air-popped or cooked in small amounts of liquid vegetable oil. Desserts Eat fruit, low-fat yogurt, and fruit ices instead of pastries, cake, and cookies. Sherbet, angel food cake, gelatin dessert, frozen low-fat yogurt, or other  frozen products that do not contain saturated fat (pure fruit juice bars, frozen ice pops) are also acceptable.  COOKING METHODS Choose those methods that use little or no fat. They include: Poaching.  Braising.  Steaming.  Grilling.  Baking.  Stir-frying.  Broiling.  Microwaving.  Foods can be cooked in a nonstick pan without added fat, or use a nonfat cooking spray in regular cookware. Limit fried foods and avoid frying in saturated fat. Add moisture to lean meats by using water, broth, cooking wines, and other nonfat or low-fat sauces along with the cooking methods mentioned above. Soups and stews should be chilled after cooking. The fat that forms on top after a few hours in the  refrigerator should be skimmed off. When preparing meals, avoid using excess salt. Salt can contribute to raising blood pressure in some people.  EATING AWAY FROM HOME Order entres, potatoes, and vegetables without sauces or butter. When meat exceeds the size of a deck of cards (3 to 4 ounces), the rest can be taken home for another meal. Choose vegetable or fruit salads and ask for low-calorie salad dressings to be served on the side. Use dressings sparingly. Limit high-fat toppings, such as bacon, crumbled eggs, cheese, sunflower seeds, and olives. Ask for heart-healthy tub margarine instead of butter.

## 2015-05-25 NOTE — Assessment & Plan Note (Addendum)
SYMPTOMS NOT CONTROLLED. REVIEWED FILMS/OP NOTES FROM 2008 TO PRESENT. SYMPTOMS MOST LIKELY DUE TO: UNCONTROLLED GERD, LESS LIKELY PRIMARY ESOPHAGEAL MOTILITY DISORDER OR STRICTURE/MASS.   COMPLETE BARIUM PILL ESOPHAGRAM WITHIN 7 DAYS. CONSIDER EGD/DIL W/ MAC IF STRICTURE PRESENT. CONTINUE YOUR WEIGHT LOSS EFFORTS. LOSE 10 LBS. AVOID TRIGGERS FOR REFLUX.  FOLLOW A LOW FAT DIET. MEATS SHOULD BE BAKED, BROILED, OR BOILED.  MEATS SHOULD BE CHOPPED OR GROUND ONLY. DO NOT EAT CHUNKS OF ANYTHING.  INCREASE PROTONIX. TAKE 30 MINUTES PRIOR TO MEALS TWICE DAILY.  FOLLOW UP IN 4 MOS.  GREATER THAN 50% WAS SPENT IN COUNSELING & COORDINATION OF CARE WITH THE PATIENT: DISCUSSED DIFFERENTIAL DIAGNOSIS, PROCEDURE, BENEFITS, RISKS, AND MANAGEMENT OF DYSPHAGIA. TOTAL ENCOUNTER TIME: 40 MINS.

## 2015-05-25 NOTE — Progress Notes (Signed)
Subjective:    Patient ID: Selena [name], female    DOB: 12-25-1950, 64 y.o.   MRN: [id number]  Asencion Noble, MD  HPI LAST SEEN 2012: 192 LBS. RETIRED IN 2013. NOW 241 LBS. SINCE 2012 HAD 2 KNEE REPLACEMENTS AND INVASIVE PROCEDURES FOR BACK PAIN. HAS PROBLEMS WITH DROPPED UTERUS/VAGINA. REFLUX OUT OF CONTROL SINCE 2013. FEELS LIKE ITS GETTING WORSE SHE'S GOING TO CHOKE TO DEATH. REGURGITATION BOTHERS HER THE MOST. DRINKS A LITTLE BIT AND A FEW MINS LATER IT JUST COMES UP. WEIGHT UP-MOST IN 73 YRS. MAY WAKE UP WITH SUBJECTIVE CHILLS. TAKES MIRALAX PRN CONSTIPATION. CONSTIPATED: IF DOESN'T TAKE MIRALAX. RARE ICE CREAM OR MILK. GETS QUEAZY WHEN SHE HAS CONSTIPATION. PAIN: ALL OVER AND ESPECIALLY IN THE LOWER ABD. HAD U/S TODAY. PLANNING FOR PESSARY ON FRI. DOE. Problems swallowing: SOLIDS AND LIQUIDS, SITS AND FEELS TIGHTENING UP IN THROAT. "GETS FULL FAST".   PT DENIES FEVER, CHILLS, HEMATOCHEZIA, vomiting, melena, diarrhea, CHEST PAIN, SHORTNESS OF BREATH, CHANGE IN BOWEL IN HABITS, problems with sedation, OR heartburn.  Past Medical History  Diagnosis Date  . Cervical ca ([place]) 1984  . Constipation   . Hemorrhoids   . HTN (hypertension)   . Depression     w/ menopause  . Hyperlipemia   . Hypothyroidism   . Tachycardia     being evaluated recently by Dr. Rollene Fare  . TIA (transient ischemic attack) 1983  . GERD (gastroesophageal reflux disease)   . Bursitis   . Chronic abdominal pain     functional, seen @[facility] , extensive WU here benign   . Skin cancer     nose  . Chronic back pain     on methadone  . PUD (peptic ulcer disease) 1997/1998    negative h pylori  . Abnormal findings on imaging of biliary tract 1997-2000    CBD 40mm, Dr Albin Felling biliary/pancreatic manometry, EUS Dr. Ardis Hughs 09/18/07 dilated but otherwise normal extrahepatic duct, no masses  . S/P colonoscopy 09/10/07    Dr. Donald Pore sec to tortuous colon, followed by BE & flex sig normal  . CAD (coronary artery  disease)   . Shortness of breath     with exertion  . Stroke ([place])     1983  . H/O hiatal hernia   . Arthritis   . Complication of anesthesia     years ago woke up during surgery  . MI (myocardial infarction) ([place]) 2000  . Dysrhythmia     AFib  . 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 Pelvic pain in female 05/24/2015    Past Surgical History  Procedure Laterality Date  . Partial hysterectomy    . Cholecystectomy    . Appendectomy    . Right arm      nerve  . Inguinal hernia repair      left  . Tonsillectomy    . Arthroscopic knee    . Total knee arthroplasty  06/20/2012    Procedure: TOTAL KNEE ARTHROPLASTY;  Surgeon: Yvette Rack., MD;  Location: [facility];  Service: Orthopedics;  Laterality: Right;  . Tonsillectomy    . Abdominal hysterectomy    . Total knee arthroplasty Left 04/02/2014    Procedure: TOTAL KNEE ARTHROPLASTY;  Surgeon: Yvette Rack., MD;  Location: [facility];  Service: Orthopedics;  Laterality: Left;  . Joint replacement Bilateral     knees  . Cataract extraction w/phaco Right 09/02/2014    Procedure: CATARACT EXTRACTION PHACO AND INTRAOCULAR LENS PLACEMENT RIGHT EYE;  Surgeon: Tonny Branch, MD;  Location: [facility];  Service: Ophthalmology;  Laterality: Right;  CDE:8.96  . Cataract extraction w/phaco Left 09/13/2014    Procedure: CATARACT EXTRACTION PHACO AND INTRAOCULAR LENS PLACEMENT LEFT EYE;  Surgeon: Tonny Branch, MD;  Location: [facility];  Service: Ophthalmology;  Laterality: Left;  CDE 6.20   Allergies  Allergen Reactions  . Iohexol Swelling     Desc: Pt states she had Cardia Cath around 2000/ experienced SOB and swelling of upper body during cath procedure/ tsf   . Nubain [Nalbuphine Hcl] Other (See Comments)    Difficulty breathing  . Latex Rash  . Penicillins Itching and Rash   Current Outpatient Prescriptions  Medication Sig Dispense Refill  . ARTIFICIAL TEAR OP Apply to eye as needed.    . cyclobenzaprine (FLEXERIL) 10 MG tablet  Take 10 mg by mouth 3 (three) times daily as needed for muscle spasms.     . Fluticasone-Salmeterol (ADVAIR) 250-50 MCG/DOSE AEPB Inhale 1 puff into the lungs 2 (two) times daily.    . furosemide (LASIX) 40 MG tablet Take 40 mg by mouth daily.     Marland Kitchen gabapentin (NEURONTIN) 600 MG tablet Take 600 mg by mouth 2 (two) times daily.     Marland Kitchen levothyroxine (SYNTHROID, LEVOTHROID) 137 MCG tablet Take 137 mcg by mouth daily before breakfast.    . losartan (COZAAR) 100 MG tablet Take 100 mg by mouth daily.    . methadone (DOLOPHINE) 10 MG tablet Take 10 mg by mouth 3 (three) times daily.     . metoprolol tartrate (LOPRESSOR) 25 MG tablet Take 25 mg by mouth 2 (two) times daily.     . pantoprazole (PROTONIX) 40 MG tablet Take 40 mg by mouth daily.     . potassium chloride (K-DUR) 10 MEQ tablet Take 10 mEq by mouth 2 (two) times daily.     . simvastatin (ZOCOR) 40 MG tablet Take 40 mg by mouth daily.     Marland Kitchen triamcinolone cream (KENALOG) 0.1 % Apply 1 application topically 2 (two) times daily. Apply to leg    . zolpidem (AMBIEN) 5 MG tablet Take 5 mg by mouth at bedtime.    . nitroGLYCERIN (NITROSTAT) 0.4 MG SL tablet Place 0.4 mg under the tongue every 5 (five) minutes as needed for chest pain.      Review of Systems PER HPI OTHERWISE ALL SYSTEMS ARE NEGATIVE.    Objective:   Physical Exam  Constitutional: She is oriented to person, place, and time. She appears well-developed and well-nourished. No distress.  HENT:  Head: Normocephalic and atraumatic.  Mouth/Throat: Oropharynx is clear and moist. No oropharyngeal exudate.  Eyes: Pupils are equal, round, and reactive to light. No scleral icterus.  Neck: Normal range of motion. Neck supple.  Cardiovascular: Normal rate, regular rhythm and normal heart sounds.   Pulmonary/Chest: Effort normal and breath sounds normal. No respiratory distress.  Abdominal: Soft. Bowel sounds are normal. She exhibits no distension. There is no tenderness.  Musculoskeletal:  She exhibits edema (BIL LEs).  WALKS ASSISTED WITH A CANE.  Lymphadenopathy:    She has no cervical adenopathy.  Neurological: She is alert and oriented to person, place, and time.  NO  NEW FOCAL DEFICITS  Psychiatric:  SLIGHTLY ANXIOUS MOOD, NL AFFECT  Vitals reviewed.         Assessment & Plan:

## 2015-05-25 NOTE — Assessment & Plan Note (Signed)
OBTAIN COMPLETE TCS REPORT [facility]. PT NOT SURE WHERE OR IF SHE HAD ONE. NO RECORDS FROM [facility] IN CARE EVERYWHERE.

## 2015-05-25 NOTE — Assessment & Plan Note (Signed)
SYMPTOMS NOT CONTROLLED MOST LIKELY DUE TO LIFESTYLE FACTORS-WEIGHT UP 51 LBS SINCE 2012   CONTINUE YOUR WEIGHT LOSS EFFORTS. LOSE 10 LBS. AVOID TRIGGERS FOR REFLUX.  HANDOUT GIVEN. FOLLOW A LOW FAT DIET. MEATS SHOULD BE BAKED, BROILED, OR BOILED.  MEATS SHOULD BE CHOPPED OR GROUND ONLY. DO NOT EAT CHUNKS OF ANYTHING.  INCREASE PROTONIX. TAKE 30 MINUTES PRIOR TO MEALS TWICE DAILY.  FOLLOW UP IN 4 MOS.

## 2015-05-26 ENCOUNTER — Telehealth: Payer: Self-pay | Admitting: Adult Health

## 2015-05-26 ENCOUNTER — Telehealth: Payer: Self-pay | Admitting: *Deleted

## 2015-05-26 NOTE — Telephone Encounter (Signed)
Pt aware of ovarian cyst, will check CA125 and if normal do [place] in 6 months

## 2015-05-26 NOTE — Telephone Encounter (Signed)
Left message I called, call me back about US 

## 2015-05-26 NOTE — Telephone Encounter (Signed)
Left message to call about US  

## 2015-05-27 ENCOUNTER — Encounter: Payer: Self-pay | Admitting: Obstetrics & Gynecology

## 2015-05-27 ENCOUNTER — Ambulatory Visit (INDEPENDENT_AMBULATORY_CARE_PROVIDER_SITE_OTHER): Payer: BLUE CROSS/BLUE SHIELD | Admitting: Obstetrics & Gynecology

## 2015-05-27 VITALS — BP 112/80 | HR 72 | Ht 67.0 in | Wt 240.0 lb

## 2015-05-27 DIAGNOSIS — N993 Prolapse of vaginal vault after hysterectomy: Secondary | ICD-10-CM

## 2015-05-27 DIAGNOSIS — N838 Other noninflammatory disorders of ovary, fallopian tube and broad ligament: Secondary | ICD-10-CM

## 2015-05-27 NOTE — Progress Notes (Signed)
Patient ID: [id number] Selena [name], female   DOB: Mar 22, 1951, 64 y.o.   MRN: [id number] Chief Complaint  Patient presents with  . gyn visit    pessary fitting.    Blood pressure 112/80, pulse 72, height 5\' 7"  (1.702 m), weight 240 lb (108.863 kg).  64 y.o. No obstetric history on file. No LMP recorded. Patient has had a hysterectomy. The current method of family planning is status post hysterectomy.  Subjective Pt with vaginal vault, apex prolapse, midline, grade 2-3 Additionally has 2 small simple cysts left ovary likely benign and clinically insignificant   Objective GEN WDWN NAD Atrophic external genitalia Grade 2-3 vaginal apex prolapse No midline or adnexal masses  milex ring with support #3 is the best fit not perfect with foreshortened vagina  Pertinent ROS   Labs or studies     Impression Diagnoses this Encounter::   ICD-9-CM ICD-10-CM   1. Prolapse of vaginal vault after hysterectomy 618.5 N99.3   2. Ovarian mass, left 620.9 N83.9 CA 125    Established relevant diagnosis(es):   Plan/Recommendations: No orders of the defined types were placed in this encounter.    Labs or Scans Ordered: Orders Placed This Encounter  Procedures  . CA 125      Follow up 1 week to place her ordered pessary        All questions were answered.

## 2015-05-28 LAB — CA 125: CA 125: 24.6 U/mL (ref 0.0–38.1)

## 2015-06-01 ENCOUNTER — Telehealth: Payer: Self-pay | Admitting: Obstetrics & Gynecology

## 2015-06-01 ENCOUNTER — Inpatient Hospital Stay (HOSPITAL_COMMUNITY): Admission: RE | Admit: 2015-06-01 | Payer: BLUE CROSS/BLUE SHIELD | Source: Ambulatory Visit

## 2015-06-01 NOTE — Telephone Encounter (Signed)
Pt informed CA125 from 05/27/2015 WNL, Dr. Elonda Husky will discuss in more detail at pt next appt. Pt verbalized understanding.

## 2015-06-01 NOTE — Telephone Encounter (Signed)
No I am seeing her back this week to place her pessary so I was going to talk with her then

## 2015-06-03 ENCOUNTER — Encounter: Payer: Self-pay | Admitting: Obstetrics & Gynecology

## 2015-06-03 ENCOUNTER — Ambulatory Visit (INDEPENDENT_AMBULATORY_CARE_PROVIDER_SITE_OTHER): Payer: BLUE CROSS/BLUE SHIELD | Admitting: Obstetrics & Gynecology

## 2015-06-03 VITALS — BP 120/70 | HR 76 | Wt 238.0 lb

## 2015-06-03 DIAGNOSIS — N993 Prolapse of vaginal vault after hysterectomy: Secondary | ICD-10-CM | POA: Diagnosis not present

## 2015-06-03 NOTE — Progress Notes (Signed)
Patient ID: Selena [name], female   DOB: 1951-05-30, 64 y.o.   MRN: [id number]  Pt with grade II vaginal vault prolapse mild cystocoele primarily a vault issue  Was fitted for a Milex ring with support #3  Placed today  Good fit confirmed comfortable  atraumatic placement  Follow up in 1 month for recheck

## 2015-06-27 ENCOUNTER — Telehealth: Payer: Self-pay

## 2015-06-27 NOTE — Telephone Encounter (Signed)
Pt left Vm that her medication ( Protonix) was received from mail order and it was for once a day. She said it had been increased to bid. I left her a Vm to call and go over what she needs, she mentioned mail order and [employer].

## 2015-06-29 MED ORDER — PANTOPRAZOLE SODIUM 40 MG PO TBEC: 40.0000 mg | DELAYED_RELEASE_TABLET | Freq: Two times a day (BID) | ORAL | Status: DC

## 2015-06-29 NOTE — Telephone Encounter (Signed)
Pt said she cannot get the prescription for the Protonix 40 mg bid from [place], but she had received the prescription from mail order for once daily. I spoke to [place] and she said best thing to do is to send the prescription to mail order so they will be aware that it is prescribed now bid. Then if it requires a PA they will let her know. Sending to Refill box to have Rx sent to the mail order that is highlighted now.

## 2015-06-29 NOTE — Telephone Encounter (Signed)
Done

## 2015-07-04 ENCOUNTER — Ambulatory Visit: Payer: BLUE CROSS/BLUE SHIELD | Admitting: Obstetrics & Gynecology

## 2015-08-31 ENCOUNTER — Ambulatory Visit: Payer: BLUE CROSS/BLUE SHIELD | Admitting: Obstetrics & Gynecology

## 2015-10-21 ENCOUNTER — Ambulatory Visit: Payer: BLUE CROSS/BLUE SHIELD | Admitting: Obstetrics & Gynecology

## 2015-11-04 ENCOUNTER — Ambulatory Visit (HOSPITAL_COMMUNITY)
Admission: RE | Admit: 2015-11-04 | Discharge: 2015-11-04 | Disposition: A | Discharge status: Home / Self Care | Payer: BLUE CROSS/BLUE SHIELD | Source: Ambulatory Visit | Attending: Cardiology | Admitting: Cardiology

## 2015-11-04 ENCOUNTER — Other Ambulatory Visit (HOSPITAL_COMMUNITY): Payer: Self-pay | Admitting: Orthopedic Surgery

## 2015-11-04 DIAGNOSIS — M7989 Other specified soft tissue disorders: Secondary | ICD-10-CM

## 2015-11-04 DIAGNOSIS — I2510 Atherosclerotic heart disease of native coronary artery without angina pectoris: Secondary | ICD-10-CM | POA: Diagnosis not present

## 2015-11-04 DIAGNOSIS — E785 Hyperlipidemia, unspecified: Secondary | ICD-10-CM | POA: Insufficient documentation

## 2015-11-04 DIAGNOSIS — K219 Gastro-esophageal reflux disease without esophagitis: Secondary | ICD-10-CM | POA: Diagnosis not present

## 2015-11-04 DIAGNOSIS — I10 Essential (primary) hypertension: Secondary | ICD-10-CM | POA: Diagnosis not present

## 2015-12-08 ENCOUNTER — Other Ambulatory Visit: Payer: Self-pay | Admitting: Physical Medicine and Rehabilitation

## 2015-12-08 DIAGNOSIS — R51 Headache: Principal | ICD-10-CM

## 2015-12-08 DIAGNOSIS — R519 Headache, unspecified: Secondary | ICD-10-CM

## 2015-12-23 ENCOUNTER — Other Ambulatory Visit: Payer: BLUE CROSS/BLUE SHIELD

## 2016-01-10 ENCOUNTER — Other Ambulatory Visit (HOSPITAL_COMMUNITY): Payer: Self-pay | Admitting: Internal Medicine

## 2016-01-10 DIAGNOSIS — R51 Headache: Principal | ICD-10-CM

## 2016-01-10 DIAGNOSIS — R519 Headache, unspecified: Secondary | ICD-10-CM

## 2016-01-11 ENCOUNTER — Ambulatory Visit (HOSPITAL_COMMUNITY)
Admission: RE | Admit: 2016-01-11 | Discharge: 2016-01-11 | Disposition: A | Discharge status: Home / Self Care | Payer: BLUE CROSS/BLUE SHIELD | Source: Ambulatory Visit | Attending: Internal Medicine | Admitting: Internal Medicine

## 2016-01-11 DIAGNOSIS — R51 Headache: Principal | ICD-10-CM

## 2016-01-11 DIAGNOSIS — R519 Headache, unspecified: Secondary | ICD-10-CM

## 2016-02-22 ENCOUNTER — Ambulatory Visit: Payer: BLUE CROSS/BLUE SHIELD | Admitting: Obstetrics & Gynecology

## 2016-06-02 ENCOUNTER — Encounter (HOSPITAL_COMMUNITY): Payer: Self-pay | Admitting: *Deleted

## 2016-06-02 ENCOUNTER — Emergency Department (HOSPITAL_COMMUNITY)
Admission: EM | Admit: 2016-06-02 | Discharge: 2016-06-02 | Disposition: A | Discharge status: Home / Self Care | Payer: BLUE CROSS/BLUE SHIELD | Attending: Emergency Medicine | Admitting: Emergency Medicine

## 2016-06-02 ENCOUNTER — Emergency Department (HOSPITAL_COMMUNITY): Payer: BLUE CROSS/BLUE SHIELD

## 2016-06-02 DIAGNOSIS — Z8673 Personal history of transient ischemic attack (TIA), and cerebral infarction without residual deficits: Secondary | ICD-10-CM | POA: Diagnosis not present

## 2016-06-02 DIAGNOSIS — Z87891 Personal history of nicotine dependence: Secondary | ICD-10-CM | POA: Diagnosis not present

## 2016-06-02 DIAGNOSIS — Z79899 Other long term (current) drug therapy: Secondary | ICD-10-CM | POA: Diagnosis not present

## 2016-06-02 DIAGNOSIS — E039 Hypothyroidism, unspecified: Secondary | ICD-10-CM | POA: Diagnosis not present

## 2016-06-02 DIAGNOSIS — R079 Chest pain, unspecified: Secondary | ICD-10-CM | POA: Insufficient documentation

## 2016-06-02 DIAGNOSIS — I2510 Atherosclerotic heart disease of native coronary artery without angina pectoris: Secondary | ICD-10-CM | POA: Insufficient documentation

## 2016-06-02 DIAGNOSIS — I10 Essential (primary) hypertension: Secondary | ICD-10-CM | POA: Insufficient documentation

## 2016-06-02 LAB — CBC WITH DIFFERENTIAL/PLATELET
Basophils Absolute: 0 10*3/uL (ref 0.0–0.1)
Basophils Relative: 0 %
Eosinophils Absolute: 0.3 10*3/uL (ref 0.0–0.7)
Eosinophils Relative: 4 %
HCT: 41.9 % (ref 36.0–46.0)
Hemoglobin: 13.8 g/dL (ref 12.0–15.0)
Lymphocytes Relative: 38 %
Lymphs Abs: 2.6 10*3/uL (ref 0.7–4.0)
MCH: 28.4 pg (ref 26.0–34.0)
MCHC: 32.9 g/dL (ref 30.0–36.0)
MCV: 86.2 fL (ref 78.0–100.0)
Monocytes Absolute: 0.4 10*3/uL (ref 0.1–1.0)
Monocytes Relative: 6 %
Neutro Abs: 3.6 10*3/uL (ref 1.7–7.7)
Neutrophils Relative %: 52 %
Platelets: 274 10*3/uL (ref 150–400)
RBC: 4.86 MIL/uL (ref 3.87–5.11)
RDW: 14.9 % (ref 11.5–15.5)
WBC: 7 10*3/uL (ref 4.0–10.5)

## 2016-06-02 LAB — COMPREHENSIVE METABOLIC PANEL
ALT: 24 U/L (ref 14–54)
AST: 34 U/L (ref 15–41)
Albumin: 4.1 g/dL (ref 3.5–5.0)
Alkaline Phosphatase: 67 U/L (ref 38–126)
Anion gap: 6 (ref 5–15)
BUN: 13 mg/dL (ref 6–20)
CO2: 26 mmol/L (ref 22–32)
Calcium: 9.3 mg/dL (ref 8.9–10.3)
Chloride: 107 mmol/L (ref 101–111)
Creatinine, Ser: 1.01 mg/dL — ABNORMAL HIGH (ref 0.44–1.00)
GFR calc Af Amer: >60 mL/min (ref >60)
GFR calc non Af Amer: 58 mL/min — ABNORMAL LOW (ref >60)
Glucose, Bld: 128 mg/dL — ABNORMAL HIGH (ref 65–99)
Potassium: 3.9 mmol/L (ref 3.5–5.1)
Sodium: 139 mmol/L (ref 135–145)
Total Bilirubin: 0.5 mg/dL (ref 0.3–1.2)
Total Protein: 7.3 g/dL (ref 6.5–8.1)

## 2016-06-02 LAB — TROPONIN I: Troponin I: <0.03 ng/mL (ref <0.03)

## 2016-06-02 MED ORDER — HYDROMORPHONE HCL 1 MG/ML IJ SOLN
0.7500 mg | Freq: Once | INTRAMUSCULAR | Status: AC
  Administered 2016-06-02: 0.75 mg via INTRAVENOUS
  Filled 2016-06-02: qty 1

## 2016-06-02 NOTE — ED Provider Notes (Signed)
[facility] DEPT Provider Note   CSN: [id number] Arrival date & time: 06/02/16  1923     History   Chief Complaint Chief Complaint  Patient presents with  . Chest Pain    HPI ESTAFANI [name] is a 65 y.o. female.  HPI   65 year old female with chest pain. Patient describes a pressure of similar chest instruments daily down to her left upper extremity. She reports onset of her type symptoms intermittently for months to years. She takes nitroglycerin which improves her symptoms but they did not. She denies any other associated symptoms of stenosis pain, diaphoresis, nausea or palpitations. No clear exacerbating relieving factors. No fevers or chills. No cough. No unusual exercise.  Past Medical History:  Diagnosis Date  . Abnormal findings on imaging of biliary tract 1997-2000   CBD 37mm, Dr Albin Felling biliary/pancreatic manometry, EUS Dr. Ardis Hughs 09/18/07 dilated but otherwise normal extrahepatic duct, no masses  . Arthritis   . Bursitis   . CAD (coronary artery disease)   . Cervical ca ([place]) 1984  . Chronic abdominal pain    functional, seen @[facility] , extensive WU here benign   . 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 Complication of anesthesia    years ago woke up during surgery  . Constipation   . Depression    w/ menopause  . Dysrhythmia    AFib  . GERD (gastroesophageal reflux disease)   . H/O hiatal hernia   . Hemorrhoids   . HTN (hypertension)   . Hyperlipemia   . Hypothyroidism   . MI (myocardial infarction) 2000  . Pelvic pain in female 05/24/2015  . 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 PUD (peptic ulcer disease) 1997/1998   negative h pylori  . S/P colonoscopy 09/10/07   Dr. Donald Pore sec to tortuous colon, followed by BE & flex sig normal  . Shortness of breath    with exertion  . Skin cancer    nose  . Stroke ([place])    1983  . Tachycardia    being evaluated recently by Dr. Rollene Fare  . TIA (transient ischemic attack) 1983      Patient Active Problem List   Diagnosis Date Noted  . Hx of colonoscopy 05/25/2015  . 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 Pelvic pain in female 05/24/2015  . Pain in joint, lower leg 04/22/2014  . Osteoarthritis of left knee 04/02/2014  . Stiffness of joint, not elsewhere classified, lower leg 07/24/2012  . Difficulty in walking(719.7) 07/24/2012  . DJD (degenerative joint disease) of knee, end stage R knee 06/21/2012  . HTN (hypertension)   . Depression   . Hyperlipemia   . Hypothyroidism   . Chronic back pain   . PUD (peptic ulcer disease)   . CAD (coronary artery disease)   . Knee pain 01/07/2012  . Abnormality of gait 01/07/2012  . Muscle weakness (generalized) 01/07/2012  . Dysphagia 12/13/2010  . Abnormal finding of biliary tract 12/13/2010  . GERD (gastroesophageal reflux disease) 12/13/2010    Past Surgical History:  Procedure Laterality Date  . ABDOMINAL HYSTERECTOMY    . APPENDECTOMY    . arthroscopic knee    . CATARACT EXTRACTION W/PHACO Right 09/02/2014   Procedure: CATARACT EXTRACTION PHACO AND INTRAOCULAR LENS PLACEMENT RIGHT EYE;  Surgeon: Tonny Branch, MD;  Location: [facility];  Service: Ophthalmology;  Laterality: Right;  CDE:8.96  . CATARACT EXTRACTION W/PHACO Left 09/13/2014   Procedure: CATARACT EXTRACTION PHACO AND INTRAOCULAR LENS PLACEMENT  LEFT EYE;  Surgeon: Tonny Branch, MD;  Location: [facility];  Service: Ophthalmology;  Laterality: Left;  CDE 6.20  . CHOLECYSTECTOMY    . INGUINAL HERNIA REPAIR     left  . JOINT REPLACEMENT Bilateral    knees  . PARTIAL HYSTERECTOMY    . right arm     nerve  . TONSILLECTOMY    . TONSILLECTOMY    . TOTAL KNEE ARTHROPLASTY  06/20/2012   Procedure: TOTAL KNEE ARTHROPLASTY;  Surgeon: Yvette Rack., MD;  Location: [facility];  Service: Orthopedics;  Laterality: Right;  . TOTAL KNEE ARTHROPLASTY Left 04/02/2014   Procedure: TOTAL KNEE ARTHROPLASTY;  Surgeon: Yvette Rack., MD;  Location: [facility];   Service: Orthopedics;  Laterality: Left;    OB History    No data available       Home Medications    Prior to Admission medications   Medication Sig Start Date End Date Taking? Authorizing Provider  cyclobenzaprine (FLEXERIL) 10 MG tablet Take 10 mg by mouth 3 (three) times daily as needed for muscle spasms.  12/10/10  Yes Historical Provider, MD  diclofenac sodium (VOLTAREN) 1 % GEL Apply 2 g topically daily as needed (for pain/arthritis).  05/10/16  Yes Historical Provider, MD  Fluticasone-Salmeterol (ADVAIR) 250-50 MCG/DOSE AEPB Inhale 1 puff into the lungs 2 (two) times daily.   Yes Historical Provider, MD  furosemide (LASIX) 40 MG tablet Take 40 mg by mouth daily.  11/02/10  Yes Historical Provider, MD  gabapentin (NEURONTIN) 600 MG tablet Take 600 mg by mouth 2 (two) times daily.    Yes Historical Provider, MD  KLOR-CON M10 10 MEQ tablet Take 10 mEq by mouth 2 (two) times daily. 05/14/16  Yes Historical Provider, MD  levothyroxine (SYNTHROID, LEVOTHROID) 137 MCG tablet Take 137 mcg by mouth daily before breakfast.   Yes Historical Provider, MD  losartan (COZAAR) 100 MG tablet Take 100 mg by mouth daily.   Yes Historical Provider, MD  metoprolol tartrate (LOPRESSOR) 25 MG tablet Take 25 mg by mouth 2 (two) times daily.  11/23/10  Yes Historical Provider, MD  morphine (MSIR) 15 MG tablet Take 15 mg by mouth 4 (four) times daily. 05/15/16  Yes Historical Provider, MD  MOVANTIK 25 MG TABS tablet Take 25 mg by mouth daily as needed (for constipation).  04/02/16  Yes Historical Provider, MD  nitroGLYCERIN (NITROSTAT) 0.4 MG SL tablet Place 0.4 mg under the tongue every 5 (five) minutes as needed for chest pain.    Yes Historical Provider, MD  pantoprazole (PROTONIX) 40 MG tablet Take 1 tablet (40 mg total) by mouth 2 (two) times daily before a meal. 06/29/15  Yes Mahala Menghini, PA-C  simvastatin (ZOCOR) 40 MG tablet Take 40 mg by mouth daily.  11/24/10  Yes Historical Provider, MD  triamcinolone  cream (KENALOG) 0.1 % Apply 1 application topically 2 (two) times daily. Apply to leg   Yes Historical Provider, MD  zolpidem (AMBIEN) 5 MG tablet Take 5 mg by mouth at bedtime. 02/13/14  Yes Historical Provider, MD    Family History Family History  Problem Relation Age of Onset  . Ulcers Brother   . Heart attack Brother   . Ulcers Sister   . Thyroid disease Daughter   . Hypertension Daughter   . Other Daughter     nervous  . Hypertension Sister   . Heart disease Sister   . Thyroid disease Sister   . Hypertension Sister   . Heart  disease Sister   . Thyroid disease Sister   . Heart attack Brother   . Cancer Brother     prostate    Social History Social History  Substance Use Topics  . Smoking status: Former Smoker    Packs/day: 1.00    Years: 22.00    Types: Cigarettes    Quit date: 01/05/2004  . Smokeless tobacco: Never Used     Comment: Quit in 2005  . Alcohol use No     Allergies   Iohexol; Nubain [nalbuphine hcl]; Latex; and Penicillins   Review of Systems Review of Systems  All systems reviewed and negative, other than as noted in HPI.   Physical Exam Updated Vital Signs BP 122/59   Pulse 64   Temp 97.9 F (36.6 C)   Resp 15   Ht 5\' 3"  (1.6 m)   Wt 200 lb (90.7 kg)   SpO2 92%   BMI 35.43 kg/m   Physical Exam  Constitutional: She appears well-developed and well-nourished. No distress.  HENT:  Head: Normocephalic and atraumatic.  Eyes: Conjunctivae are normal. Right eye exhibits no discharge. Left eye exhibits no discharge.  Neck: Neck supple.  Cardiovascular: Normal rate, regular rhythm and normal heart sounds.  Exam reveals no gallop and no friction rub.   No murmur heard. Pulmonary/Chest: Effort normal and breath sounds normal. No respiratory distress.  Abdominal: Soft. She exhibits no distension. There is no tenderness.  Musculoskeletal: She exhibits no edema or tenderness.  Lower extremities symmetric as compared to each other. No calf  tenderness. Negative Homan's. No palpable cords.   Neurological: She is alert.  Skin: Skin is warm and dry.  Psychiatric: She has a normal mood and affect. Her behavior is normal. Thought content normal.  Nursing note and vitals reviewed.    ED Treatments / Results  Labs (all labs ordered are listed, but only abnormal results are displayed) Labs Reviewed  COMPREHENSIVE METABOLIC PANEL - Abnormal; Notable for the following:       Result Value   Glucose, Bld 128 (*)    Creatinine, Ser 1.01 (*)    GFR calc non Af Amer 58 (*)    All other components within normal limits  TROPONIN I  CBC WITH DIFFERENTIAL/PLATELET    EKG  EKG Interpretation  Date/Time:  Saturday June 02 2016 19:33:20 EDT Ventricular Rate:  114 PR Interval:    QRS Duration: 110 QT Interval:  345 QTC Calculation: 476 R Axis:   -154 Text Interpretation:  Sinus tachycardia Low voltage with right axis deviation Abnormal R-wave progression, late transition Abnormal inferior Q waves Since last tracing rate faster Confirmed by Winfred Leeds  MD, SAM [phone]) on 06/03/2016 2:59:04 PM       Radiology Dg Chest 2 View  Result Date: 06/02/2016 CLINICAL DATA:  Chest pain EXAM: CHEST  2 VIEW COMPARISON:  06/15/2013 FINDINGS: No acute infiltrate or effusion. Stable cardiomediastinal silhouette. Mild atelectasis or scar at the left base. No pneumothorax. Degenerative changes of the spine. IMPRESSION: Mild left basilar atelectasis or scar. No radiographic evidence for acute cardiopulmonary abnormality. Electronically Signed   By: Donavan Foil M.D.   On: 06/02/2016 20:58    Procedures Procedures (including critical care time)  Medications Ordered in ED Medications  HYDROmorphone (DILAUDID) injection 0.75 mg (0.75 mg Intravenous Given 06/02/16 2002)     Initial Impression / Assessment and Plan / ED Course  I have reviewed the triage vital signs and the nursing notes.  Pertinent labs & imaging results  that were  available during my care of the patient were reviewed by me and considered in my medical decision making (see chart for details).  Clinical Course    65 with chest pain. P Symptoms seem somewhat typical but she reports they are actually chronic and only thing different is today not relieved with nitro. He workup is fairly unremarkable. She is now pain-free. I actually doubt ACS. Doubt PE, dissection or other emergent process.  Final Clinical Impressions(s) / ED Diagnoses   Final diagnoses:  Nonspecific chest pain    New Prescriptions New Prescriptions   No medications on file     Virgel Manifold, MD 06/11/16 1111

## 2016-06-02 NOTE — ED Triage Notes (Signed)
Pt reports left sided chest pressure "for awhile" that radiates down her left arm.  Pt states she usually takes nitro and it gets rid of her chest pain/pressure but today the nitro hasn't helped. Pt denies any other symptoms.

## 2016-06-18 ENCOUNTER — Ambulatory Visit: Payer: BLUE CROSS/BLUE SHIELD | Admitting: Internal Medicine

## 2016-06-18 ENCOUNTER — Encounter: Payer: Self-pay | Admitting: Internal Medicine

## 2016-06-25 ENCOUNTER — Encounter: Payer: Self-pay | Admitting: Cardiovascular Disease

## 2016-06-25 ENCOUNTER — Ambulatory Visit (INDEPENDENT_AMBULATORY_CARE_PROVIDER_SITE_OTHER): Payer: BLUE CROSS/BLUE SHIELD | Admitting: Cardiovascular Disease

## 2016-06-25 VITALS — BP 142/80 | HR 77 | Ht 63.0 in | Wt 223.0 lb

## 2016-06-25 DIAGNOSIS — Z23 Encounter for immunization: Secondary | ICD-10-CM | POA: Diagnosis not present

## 2016-06-25 DIAGNOSIS — R072 Precordial pain: Secondary | ICD-10-CM

## 2016-06-25 DIAGNOSIS — Z9289 Personal history of other medical treatment: Secondary | ICD-10-CM

## 2016-06-25 DIAGNOSIS — I10 Essential (primary) hypertension: Secondary | ICD-10-CM | POA: Diagnosis not present

## 2016-06-25 NOTE — Progress Notes (Signed)
CARDIOLOGY CONSULT NOTE  Patient ID: Selena [name] MRN: [id number] DOB/AGE: Jan 02, 1951 65 y.o.  Admit date: (Not on file) Primary Physician: Asencion Noble, MD Referring Physician:   Reason for Consultation: chest pain  HPI: 65 year old woman referred for the evaluation of chest pain. Evaluated in the ED on 06/02/16. Complained of precordial pain radiating down the left arm. Nitroglycerin did not alleviate the pain. The pains were deemed somewhat typical but had been chronic. Troponin was normal. Chest x-ray was unremarkable (mild left basilar atelectasis vs scar) as was ECG (sinus tachycardia). CBC was normal. BUN 13, creatinine 1.01.  She had a negative submaximal stress test with mild left ventricular dilatation and normal left ventricular systolic function with no convincing evidence of myocardial ischemia or infarction dated 04/11/2005. As per PCP notes, she had a negative cardiac catheterization approximately 12 years ago.  She has had chest pressure for some time which is usually relieved with nitroglycerin. She has not had to use nitroglycerin for years. The chest pressure she experienced in late October was associated with shortness of breath. She denies any associated nausea, vomiting, lightheadedness, dizziness, or syncope.   She has bilateral knee pain and walks with a cane. She also has bilateral venous varicosities and sees a specialist for this. She also complains of stress in her life.   Current Outpatient Prescriptions  Medication Sig Dispense Refill  . cyclobenzaprine (FLEXERIL) 10 MG tablet Take 10 mg by mouth 3 (three) times daily as needed for muscle spasms.     . diclofenac sodium (VOLTAREN) 1 % GEL Apply 2 g topically daily as needed (for pain/arthritis).     . Fluticasone-Salmeterol (ADVAIR) 250-50 MCG/DOSE AEPB Inhale 1 puff into the lungs 2 (two) times daily.    . furosemide (LASIX) 40 MG tablet Take 40 mg by mouth daily.     Marland Kitchen gabapentin (NEURONTIN) 600 MG  tablet Take 600 mg by mouth 2 (two) times daily.     Marland Kitchen KLOR-CON M10 10 MEQ tablet Take 10 mEq by mouth 2 (two) times daily.    Marland Kitchen levothyroxine (SYNTHROID, LEVOTHROID) 137 MCG tablet Take 137 mcg by mouth daily before breakfast.    . losartan (COZAAR) 100 MG tablet Take 100 mg by mouth daily.    . metoprolol tartrate (LOPRESSOR) 25 MG tablet Take 25 mg by mouth 2 (two) times daily.     Marland Kitchen morphine (MSIR) 15 MG tablet Take 15 mg by mouth 4 (four) times daily.    Marland Kitchen MOVANTIK 25 MG TABS tablet Take 25 mg by mouth daily as needed (for constipation).     . nitroGLYCERIN (NITROSTAT) 0.4 MG SL tablet Place 0.4 mg under the tongue every 5 (five) minutes as needed for chest pain.     . pantoprazole (PROTONIX) 40 MG tablet Take 1 tablet (40 mg total) by mouth 2 (two) times daily before a meal. 180 tablet 3  . simvastatin (ZOCOR) 40 MG tablet Take 40 mg by mouth daily.     Marland Kitchen triamcinolone cream (KENALOG) 0.1 % Apply 1 application topically 2 (two) times daily. Apply to leg    . zolpidem (AMBIEN) 5 MG tablet Take 5 mg by mouth at bedtime.     No current facility-administered medications for this visit.     Past Medical History:  Diagnosis Date  . Abnormal findings on imaging of biliary tract 1997-2000   CBD 57mm, Dr Albin Felling biliary/pancreatic manometry, EUS Dr. Ardis Hughs 09/18/07 dilated but otherwise normal extrahepatic duct, no masses  .  Arthritis   . Bursitis   . CAD (coronary artery disease)   . Cervical ca ([place]) 1984  . Chronic abdominal pain    functional, seen @[facility] , extensive WU here benign   . 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 Complication of anesthesia    years ago woke up during surgery  . Constipation   . Depression    w/ menopause  . Dysrhythmia    AFib  . GERD (gastroesophageal reflux disease)   . H/O hiatal hernia   . Hemorrhoids   . HTN (hypertension)   . Hyperlipemia   . Hypothyroidism   . MI (myocardial infarction) 2000  . Pelvic pain in female 05/24/2015  . Prolapse of  vaginal vault after hysterectomy 05/24/2015  . PUD (peptic ulcer disease) 1997/1998   negative h pylori  . S/P colonoscopy 09/10/07   Dr. Donald Pore sec to tortuous colon, followed by BE & flex sig normal  . Shortness of breath    with exertion  . Skin cancer    nose  . Stroke ([place])    1983  . Tachycardia    being evaluated recently by Dr. Rollene Fare  . TIA (transient ischemic attack) 1983    Past Surgical History:  Procedure Laterality Date  . ABDOMINAL HYSTERECTOMY    . APPENDECTOMY    . arthroscopic knee    . CATARACT EXTRACTION W/PHACO Right 09/02/2014   Procedure: CATARACT EXTRACTION PHACO AND INTRAOCULAR LENS PLACEMENT RIGHT EYE;  Surgeon: Tonny Branch, MD;  Location: [facility];  Service: Ophthalmology;  Laterality: Right;  CDE:8.96  . CATARACT EXTRACTION W/PHACO Left 09/13/2014   Procedure: CATARACT EXTRACTION PHACO AND INTRAOCULAR LENS PLACEMENT LEFT EYE;  Surgeon: Tonny Branch, MD;  Location: [facility];  Service: Ophthalmology;  Laterality: Left;  CDE 6.20  . CHOLECYSTECTOMY    . INGUINAL HERNIA REPAIR     left  . JOINT REPLACEMENT Bilateral    knees  . PARTIAL HYSTERECTOMY    . right arm     nerve  . TONSILLECTOMY    . TONSILLECTOMY    . TOTAL KNEE ARTHROPLASTY  06/20/2012   Procedure: TOTAL KNEE ARTHROPLASTY;  Surgeon: Yvette Rack., MD;  Location: [facility];  Service: Orthopedics;  Laterality: Right;  . TOTAL KNEE ARTHROPLASTY Left 04/02/2014   Procedure: TOTAL KNEE ARTHROPLASTY;  Surgeon: Yvette Rack., MD;  Location: [facility];  Service: Orthopedics;  Laterality: Left;    Social History   Social History  . Marital status: Married    Spouse name: N/A  . Number of children: 1  . Years of education: N/A   Occupational History  . 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 Smoking status: Former Smoker    Packs/day: 1.00    Years: 22.00    Types: Cigarettes    Start date: 08/06/1981    Quit date: 01/05/2004  . Smokeless tobacco: Never Used      Comment: Quit in 2005  . Alcohol use No  . Drug use: No  . Sexual activity: No     Comment: hyst   Other Topics Concern  . Not on file   Social History Narrative  . No narrative on file     No family history of premature CAD in 1st degree relatives.  Prior to Admission medications   Medication Sig Start Date End Date Taking? Authorizing Adiva Boettner  cyclobenzaprine (FLEXERIL) 10 MG tablet Take 10 mg by mouth 3 (three) times daily  as needed for muscle spasms.  12/10/10   Historical Deosha Werden, MD  diclofenac sodium (VOLTAREN) 1 % GEL Apply 2 g topically daily as needed (for pain/arthritis).  05/10/16   Historical Cinsere Mizrahi, MD  Fluticasone-Salmeterol (ADVAIR) 250-50 MCG/DOSE AEPB Inhale 1 puff into the lungs 2 (two) times daily.    Historical Shanyce Daris, MD  furosemide (LASIX) 40 MG tablet Take 40 mg by mouth daily.  11/02/10   Historical Khary Schaben, MD  gabapentin (NEURONTIN) 600 MG tablet Take 600 mg by mouth 2 (two) times daily.     Historical Teighlor Korson, MD  KLOR-CON M10 10 MEQ tablet Take 10 mEq by mouth 2 (two) times daily. 05/14/16   Historical Leiann Sporer, MD  levothyroxine (SYNTHROID, LEVOTHROID) 137 MCG tablet Take 137 mcg by mouth daily before breakfast.    Historical Laksh Hinners, MD  losartan (COZAAR) 100 MG tablet Take 100 mg by mouth daily.    Historical Hailyn Zarr, MD  metoprolol tartrate (LOPRESSOR) 25 MG tablet Take 25 mg by mouth 2 (two) times daily.  11/23/10   Historical Liliana Dang, MD  morphine (MSIR) 15 MG tablet Take 15 mg by mouth 4 (four) times daily. 05/15/16   Historical Henrik Orihuela, MD  MOVANTIK 25 MG TABS tablet Take 25 mg by mouth daily as needed (for constipation).  04/02/16   Historical Athena Baltz, MD  nitroGLYCERIN (NITROSTAT) 0.4 MG SL tablet Place 0.4 mg under the tongue every 5 (five) minutes as needed for chest pain.     Historical Travius Crochet, MD  pantoprazole (PROTONIX) 40 MG tablet Take 1 tablet (40 mg total) by mouth 2 (two) times daily before a meal. 06/29/15   Mahala Menghini, PA-C    simvastatin (ZOCOR) 40 MG tablet Take 40 mg by mouth daily.  11/24/10   Historical Labrenda Lasky, MD  triamcinolone cream (KENALOG) 0.1 % Apply 1 application topically 2 (two) times daily. Apply to leg    Historical Rosamae Rocque, MD  zolpidem (AMBIEN) 5 MG tablet Take 5 mg by mouth at bedtime. 02/13/14   Historical Yoshiko Keleher, MD     Review of systems complete and found to be negative unless listed above in HPI     Physical exam Blood pressure (!) 142/80, pulse 77, height 5\' 3"  (1.6 m), weight 223 lb (101.2 kg), SpO2 93 %. General: NAD Neck: No JVD, no thyromegaly or thyroid nodule.  Lungs: Clear to auscultation bilaterally with normal respiratory effort. CV: Nondisplaced PMI. Regular rate and rhythm, normal S1/S2, no S3/S4, no murmur.  No peripheral edema. Bilateral venous varicosities in legs.  No carotid bruit.  Abdomen: Soft, nontender, obese, no bruits. Skin: Intact without lesions or rashes.  Neurologic: Alert and oriented x 3.  Psych: Normal affect. Extremities: No clubbing or cyanosis.  HEENT: Normal.   ECG: Most recent ECG reviewed.  Labs:   Lab Results  Component Value Date   WBC 7.0 06/02/2016   HGB 13.8 06/02/2016   HCT 41.9 06/02/2016   MCV 86.2 06/02/2016   PLT 274 06/02/2016   No results for input(s): NA, K, CL, CO2, BUN, CREATININE, CALCIUM, PROT, BILITOT, ALKPHOS, ALT, AST, GLUCOSE in the last 168 hours.  Invalid input(s): LABALBU Lab Results  Component Value Date   CKTOTAL 121 10/14/2009   CKMB 2.2 10/14/2009   TROPONINI <0.03 06/02/2016    Lab Results  Component Value Date   CHOL  05/14/2008    180        ATP III CLASSIFICATION:  <200     mg/dL   Desirable  200-239  mg/dL  Borderline High  >=240    mg/dL   High   Lab Results  Component Value Date   HDL 41 05/14/2008   Lab Results  Component Value Date   LDLCALC  05/14/2008    80        Total Cholesterol/HDL:CHD Risk Coronary Heart Disease Risk Table                     Men   Women  1/2 Average  Risk   3.4   3.3   Lab Results  Component Value Date   TRIG 296 (H) 05/14/2008   Lab Results  Component Value Date   CHOLHDL 4.4 05/14/2008   No results found for: LDLDIRECT       Studies: No results found.  ASSESSMENT AND PLAN:  1. Chest pain: I will proceed with a nuclear myocardial perfusion imaging study ([place]) to evaluate for ischemic heart disease. I will order a 2-D echocardiogram with Doppler to evaluate cardiac structure, function, and regional wall motion.  2. HTN: Mildly elevated. Will monitor.   Dispo: fu 6 weeks.    Signed: Kate Sable, M.D., F.A.C.C.  06/25/2016, 9:58 AM

## 2016-06-25 NOTE — Patient Instructions (Addendum)
Your physician wants you to follow-up in: 6 weeks with Dr Bronson Ing  Your physician has requested that you have a lexiscan myoview. For further information please visit [email or web address]. Please follow instruction sheet, as given.    Your physician has requested that you have an echocardiogram. Echocardiography is a painless test that uses sound waves to create images of your heart. It provides your doctor with information about the size and shape of your heart and how well your heart's chambers and valves are working. This procedure takes approximately one hour. There are no restrictions for this procedure.     Your physician recommends that you continue on your current medications as directed. Please refer to the Current Medication list given to you today.    Thank you for choosing [facility] !

## 2016-07-06 ENCOUNTER — Encounter (HOSPITAL_COMMUNITY): Payer: BLUE CROSS/BLUE SHIELD

## 2016-07-06 ENCOUNTER — Inpatient Hospital Stay (HOSPITAL_COMMUNITY): Admission: RE | Admit: 2016-07-06 | Payer: BLUE CROSS/BLUE SHIELD | Source: Ambulatory Visit

## 2016-07-06 ENCOUNTER — Ambulatory Visit (HOSPITAL_COMMUNITY): Payer: BLUE CROSS/BLUE SHIELD | Attending: Cardiovascular Disease

## 2016-07-26 ENCOUNTER — Other Ambulatory Visit: Payer: Self-pay

## 2016-07-27 MED ORDER — PANTOPRAZOLE SODIUM 40 MG PO TBEC: 40.0000 mg | DELAYED_RELEASE_TABLET | Freq: Two times a day (BID) | ORAL | 3 refills | Status: DC

## 2016-08-22 ENCOUNTER — Ambulatory Visit: Payer: BLUE CROSS/BLUE SHIELD | Admitting: Cardiovascular Disease

## 2016-09-22 ENCOUNTER — Encounter (HOSPITAL_COMMUNITY): Payer: Self-pay | Admitting: Emergency Medicine

## 2016-09-22 ENCOUNTER — Emergency Department (HOSPITAL_COMMUNITY)
Admission: EM | Admit: 2016-09-22 | Discharge: 2016-09-22 | Disposition: A | Discharge status: Home / Self Care | Payer: BLUE CROSS/BLUE SHIELD | Attending: Emergency Medicine | Admitting: Emergency Medicine

## 2016-09-22 ENCOUNTER — Emergency Department (HOSPITAL_COMMUNITY): Payer: BLUE CROSS/BLUE SHIELD

## 2016-09-22 DIAGNOSIS — Z79899 Other long term (current) drug therapy: Secondary | ICD-10-CM | POA: Diagnosis not present

## 2016-09-22 DIAGNOSIS — I2510 Atherosclerotic heart disease of native coronary artery without angina pectoris: Secondary | ICD-10-CM | POA: Insufficient documentation

## 2016-09-22 DIAGNOSIS — I10 Essential (primary) hypertension: Secondary | ICD-10-CM | POA: Diagnosis not present

## 2016-09-22 DIAGNOSIS — Z85828 Personal history of other malignant neoplasm of skin: Secondary | ICD-10-CM | POA: Diagnosis not present

## 2016-09-22 DIAGNOSIS — Z87891 Personal history of nicotine dependence: Secondary | ICD-10-CM | POA: Diagnosis not present

## 2016-09-22 DIAGNOSIS — E039 Hypothyroidism, unspecified: Secondary | ICD-10-CM | POA: Insufficient documentation

## 2016-09-22 DIAGNOSIS — B349 Viral infection, unspecified: Secondary | ICD-10-CM | POA: Diagnosis not present

## 2016-09-22 DIAGNOSIS — R05 Cough: Secondary | ICD-10-CM | POA: Diagnosis present

## 2016-09-22 MED ORDER — IPRATROPIUM-ALBUTEROL 0.5-2.5 (3) MG/3ML IN SOLN
3.0000 mL | Freq: Once | RESPIRATORY_TRACT | Status: AC
  Administered 2016-09-22: 3 mL via RESPIRATORY_TRACT
  Filled 2016-09-22: qty 3

## 2016-09-22 MED ORDER — ALBUTEROL SULFATE (2.5 MG/3ML) 0.083% IN NEBU
2.5000 mg | INHALATION_SOLUTION | Freq: Once | RESPIRATORY_TRACT | Status: AC
  Administered 2016-09-22: 2.5 mg via RESPIRATORY_TRACT
  Filled 2016-09-22: qty 3

## 2016-09-22 MED ORDER — ALBUTEROL SULFATE HFA 108 (90 BASE) MCG/ACT IN AERS: 2.0000 | INHALATION_SPRAY | RESPIRATORY_TRACT | 0 refills | Status: DC | PRN

## 2016-09-22 NOTE — ED Triage Notes (Signed)
Patient c/o congested cough with fever and body aches that started on Tuesday. Patient seen by PCP and diagnosed with bacterial bronchitis. Patient given advair inhaler, tessalon pearls, and doxycycline with no relief.

## 2016-09-22 NOTE — ED Notes (Signed)
Respiratory called

## 2016-09-22 NOTE — ED Provider Notes (Signed)
[facility] DEPT Provider Note   CSN: [id number] Arrival date & time: 09/22/16  1157     History   Chief Complaint Chief Complaint  Patient presents with  . Cough    HPI Selena [name] is a 66 y.o. female.  HPI  Pt was seen at 1220.  Per pt, c/o gradual onset and persistence of constant runny/stuffy nose, sinus congestion, and cough for the past 4 days. Has been associated with subjective fevers/chills and generalized body aches.  States pt was dx with "bacterial bronchitis" by her PMD, rx tessalon, advair, and doxycycline without improvement. Denies rash, no CP/SOB, no N/V/D, no abd pain.    Past Medical History:  Diagnosis Date  . Abnormal findings on imaging of biliary tract 1997-2000   CBD 26mm, Dr Albin Felling biliary/pancreatic manometry, EUS Dr. Ardis Hughs 09/18/07 dilated but otherwise normal extrahepatic duct, no masses  . Arthritis   . Bursitis   . CAD (coronary artery disease)   . Cervical ca ([place]) 1984  . Chronic abdominal pain    functional, seen @[facility] , extensive WU here benign   . 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 Complication of anesthesia    years ago woke up during surgery  . Constipation   . Depression    w/ menopause  . Dysrhythmia    AFib  . GERD (gastroesophageal reflux disease)   . H/O hiatal hernia   . Hemorrhoids   . HTN (hypertension)   . Hyperlipemia   . Hypothyroidism   . MI (myocardial infarction) 2000  . Pelvic pain in female 05/24/2015  . 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 PUD (peptic ulcer disease) 1997/1998   negative h pylori  . S/P colonoscopy 09/10/07   Dr. Donald Pore sec to tortuous colon, followed by BE & flex sig normal  . Shortness of breath    with exertion  . Skin cancer    nose  . Stroke ([place])    1983  . Tachycardia    being evaluated recently by Dr. Rollene Fare  . TIA (transient ischemic attack) 1983    Patient Active Problem List   Diagnosis Date Noted  . Hx of colonoscopy  05/25/2015  . 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 Pelvic pain in female 05/24/2015  . Pain in joint, lower leg 04/22/2014  . Osteoarthritis of left knee 04/02/2014  . Stiffness of joint, not elsewhere classified, lower leg 07/24/2012  . Difficulty in walking(719.7) 07/24/2012  . DJD (degenerative joint disease) of knee, end stage R knee 06/21/2012  . HTN (hypertension)   . Depression   . Hyperlipemia   . Hypothyroidism   . Chronic back pain   . PUD (peptic ulcer disease)   . CAD (coronary artery disease)   . Knee pain 01/07/2012  . Abnormality of gait 01/07/2012  . Muscle weakness (generalized) 01/07/2012  . Dysphagia 12/13/2010  . Abnormal finding of biliary tract 12/13/2010  . GERD (gastroesophageal reflux disease) 12/13/2010    Past Surgical History:  Procedure Laterality Date  . ABDOMINAL HYSTERECTOMY    . APPENDECTOMY    . arthroscopic knee    . CATARACT EXTRACTION W/PHACO Right 09/02/2014   Procedure: CATARACT EXTRACTION PHACO AND INTRAOCULAR LENS PLACEMENT RIGHT EYE;  Surgeon: Tonny Branch, MD;  Location: [facility];  Service: Ophthalmology;  Laterality: Right;  CDE:8.96  . CATARACT EXTRACTION W/PHACO Left 09/13/2014   Procedure: CATARACT EXTRACTION PHACO AND INTRAOCULAR LENS PLACEMENT LEFT EYE;  Surgeon: Tonny Branch, MD;  Location: [facility];  Service: Ophthalmology;  Laterality: Left;  CDE 6.20  . CHOLECYSTECTOMY    . INGUINAL HERNIA REPAIR     left  . JOINT REPLACEMENT Bilateral    knees  . PARTIAL HYSTERECTOMY    . right arm     nerve  . TONSILLECTOMY    . TONSILLECTOMY    . TOTAL KNEE ARTHROPLASTY  06/20/2012   Procedure: TOTAL KNEE ARTHROPLASTY;  Surgeon: Yvette Rack., MD;  Location: [facility];  Service: Orthopedics;  Laterality: Right;  . TOTAL KNEE ARTHROPLASTY Left 04/02/2014   Procedure: TOTAL KNEE ARTHROPLASTY;  Surgeon: Yvette Rack., MD;  Location: [facility];  Service: Orthopedics;  Laterality: Left;    OB History    No data available        Home Medications    Prior to Admission medications   Medication Sig Start Date End Date Taking? Authorizing Provider  benzonatate (TESSALON) 200 MG capsule Take 1 capsule by mouth 3 (three) times daily. 09/19/16  Yes Historical Provider, MD  cyclobenzaprine (FLEXERIL) 10 MG tablet Take 10 mg by mouth 3 (three) times daily as needed for muscle spasms.  12/10/10  Yes Historical Provider, MD  diclofenac sodium (VOLTAREN) 1 % GEL Apply 2 g topically daily as needed (for pain/arthritis).  05/10/16  Yes Historical Provider, MD  doxycycline (VIBRA-TABS) 100 MG tablet Take 1 tablet by mouth 2 (two) times daily. 09/19/16  Yes Historical Provider, MD  Fluticasone-Salmeterol (ADVAIR) 250-50 MCG/DOSE AEPB Inhale 1 puff into the lungs 2 (two) times daily.   Yes Historical Provider, MD  furosemide (LASIX) 40 MG tablet Take 40 mg by mouth daily.  11/02/10  Yes Historical Provider, MD  gabapentin (NEURONTIN) 600 MG tablet Take 600 mg by mouth 2 (two) times daily.    Yes Historical Provider, MD  KLOR-CON M10 10 MEQ tablet Take 10 mEq by mouth 2 (two) times daily. 05/14/16  Yes Historical Provider, MD  levothyroxine (SYNTHROID, LEVOTHROID) 137 MCG tablet Take 137 mcg by mouth daily before breakfast.   Yes Historical Provider, MD  losartan (COZAAR) 100 MG tablet Take 100 mg by mouth daily.   Yes Historical Provider, MD  metoprolol tartrate (LOPRESSOR) 25 MG tablet Take 25 mg by mouth 2 (two) times daily.  11/23/10  Yes Historical Provider, MD  morphine (MSIR) 15 MG tablet Take 15 mg by mouth 4 (four) times daily. 05/15/16  Yes Historical Provider, MD  pantoprazole (PROTONIX) 40 MG tablet Take 1 tablet (40 mg total) by mouth 2 (two) times daily before a meal. 07/27/16  Yes Annitta Needs, NP  simvastatin (ZOCOR) 40 MG tablet Take 40 mg by mouth daily.  11/24/10  Yes Historical Provider, MD  triamcinolone cream (KENALOG) 0.1 % Apply 1 application topically 2 (two) times daily. Apply to leg   Yes Historical Provider, MD   zolpidem (AMBIEN) 5 MG tablet Take 5 mg by mouth at bedtime. 02/13/14  Yes Historical Provider, MD  nitroGLYCERIN (NITROSTAT) 0.4 MG SL tablet Place 0.4 mg under the tongue every 5 (five) minutes as needed for chest pain.     Historical Provider, MD    Family History Family History  Problem Relation Age of Onset  . Ulcers Brother   . Heart attack Brother   . Ulcers Sister   . Thyroid disease Daughter   . Hypertension Daughter   . Other Daughter     nervous  . Hypertension Sister   . Heart disease Sister   . Thyroid  disease Sister   . Hypertension Sister   . Heart disease Sister   . Thyroid disease Sister   . Heart attack Brother   . Cancer Brother     prostate    Social History Social History  Substance Use Topics  . Smoking status: Former Smoker    Packs/day: 1.00    Years: 22.00    Types: Cigarettes    Start date: 08/06/1981    Quit date: 01/05/2004  . Smokeless tobacco: Never Used     Comment: Quit in 2005  . Alcohol use No     Allergies   Iohexol; Nubain [nalbuphine hcl]; Latex; and Penicillins   Review of Systems Review of Systems ROS: Statement: All systems negative except as marked or noted in the HPI; Constitutional: Negative for objective fever and +chills. ; ; Eyes: Negative for eye pain, redness and discharge. ; ; ENMT: Negative for ear pain, hoarseness, sore throat. +nasal congestion, sinus pressure and rhinorrhea. ; ; Cardiovascular: Negative for chest pain, palpitations, diaphoresis, dyspnea and peripheral edema. ; ; Respiratory: +cough. Negative for wheezing and stridor. ; ; Gastrointestinal: Negative for nausea, vomiting, diarrhea, abdominal pain, blood in stool, hematemesis, jaundice and rectal bleeding. . ; ; Genitourinary: Negative for dysuria, flank pain and hematuria. ; ; Musculoskeletal: Negative for back pain and neck pain. Negative for swelling and trauma.; ; Skin: Negative for pruritus, rash, abrasions, blisters, bruising and skin lesion.; ; Neuro:  Negative for headache, lightheadedness and neck stiffness. Negative for weakness, altered level of consciousness, altered mental status, extremity weakness, paresthesias, involuntary movement, seizure and syncope.       Physical Exam Updated Vital Signs BP 115/62   Pulse 96   Temp 98.1 F (36.7 C) (Oral)   Resp 16   Ht 5\' 4"  (1.626 m)   Wt 200 lb (90.7 kg)   SpO2 96%   BMI 34.33 kg/m   Physical Exam 1225: Physical examination:  Nursing notes reviewed; Vital signs and O2 SAT reviewed;  Constitutional: Well developed, Well nourished, Well hydrated, In no acute distress; Head:  Normocephalic, atraumatic; Eyes: EOMI, PERRL, No scleral icterus; ENMT: TM's clear bilat. +edemetous nasal turbinates bilat with clear rhinorrhea. Mouth and pharynx without lesions. No tonsillar exudates. No intra-oral edema. No submandibular or sublingual edema. No hoarse voice, no drooling, no stridor. No pain with manipulation of larynx. No trismus. Mouth and pharynx normal, Mucous membranes moist; Neck: Supple, Full range of motion, No lymphadenopathy; Cardiovascular: Regular rate and rhythm, No gallop; Respiratory: Breath sounds coarse & equal bilaterally, No wheezes. +moist cough during exam. Speaking full sentences with ease, Normal respiratory effort/excursion; Chest: Nontender, Movement normal; Abdomen: Soft, Nontender, Nondistended, Normal bowel sounds; Genitourinary: No CVA tenderness; Extremities: Pulses normal, No tenderness, No edema, No calf edema or asymmetry.; Neuro: AA&Ox3, Major CN grossly intact.  Speech clear. No gross focal motor or sensory deficits in extremities.; Skin: Color normal, Warm, Dry.   ED Treatments / Results  Labs (all labs ordered are listed, but only abnormal results are displayed)   EKG  EKG Interpretation None       Radiology   Procedures Procedures (including critical care time)  Medications Ordered in ED Medications  ipratropium-albuterol (DUONEB) 0.5-2.5 (3)  MG/3ML nebulizer solution 3 mL (3 mLs Nebulization Given 09/22/16 1252)  albuterol (PROVENTIL) (2.5 MG/3ML) 0.083% nebulizer solution 2.5 mg (2.5 mg Nebulization Given 09/22/16 1252)     Initial Impression / Assessment and Plan / ED Course  I have reviewed the triage vital signs and  the nursing notes.  Pertinent labs & imaging results that were available during my care of the patient were reviewed by me and considered in my medical decision making (see chart for details).  MDM Reviewed: previous chart, nursing note and vitals Interpretation: x-ray   Dg Chest 2 View Result Date: 09/22/2016 CLINICAL DATA:  Cough and congestion with fever for 4 days EXAM: CHEST  2 VIEW COMPARISON:  June 02, 2016 FINDINGS: There is mild bibasilar atelectasis. Lungs elsewhere are clear. Heart size and pulmonary vascularity are normal. No adenopathy. There is degenerative change in thoracic spine. IMPRESSION: Bibasilar atelectasis. Lungs elsewhere clear. Stable cardiac silhouette. Electronically Signed   By: Lowella Grip III M.D.   On: 09/22/2016 13:03    1400:  Feels improved after neb treatment. Out of window for tamiflu. Continue to tx symptomatically at this time. Dx and testing d/w pt and family.  Questions answered.  Verb understanding, agreeable to d/c home with outpt f/u.    Final Clinical Impressions(s) / ED Diagnoses   Final diagnoses:  None    New Prescriptions New Prescriptions   No medications on file     Francine Graven, DO 09/26/16 1622

## 2016-09-22 NOTE — ED Notes (Signed)
Awaiting reevaluation.

## 2016-09-22 NOTE — ED Notes (Signed)
Seen by Dr Willey Blade on Tuesday given antibiotics- Feels that she has had no improvement and reports that she has coughed up yellow expectorant

## 2016-09-22 NOTE — Discharge Instructions (Signed)
Take over the counter decongestant (safe to use with high blood pressure), as directed on packaging, for the next week.  Use over the counter normal saline nasal spray, with frequent nose blowing, several times per day for the next 2 weeks. Take your usual prescriptions as previously directed.  Use your albuterol inhaler (2 to 4 puffs) every 4 hours for the next 7 days, then as needed for cough, wheezing, or shortness of breath.  Call your regular medical doctor Monday to schedule a follow up appointment within the next 3 days.  Return to the Emergency Department immediately sooner if worsening.

## 2016-09-22 NOTE — ED Notes (Signed)
Dr Neena Rhymes in to assess

## 2016-09-22 NOTE — ED Notes (Signed)
Reports breathing better after treatment- facial features relaxed, speaking in complete sentences-awaiting recheck and disposition

## 2016-10-16 ENCOUNTER — Ambulatory Visit: Payer: BLUE CROSS/BLUE SHIELD | Admitting: Obstetrics & Gynecology

## 2016-12-03 ENCOUNTER — Other Ambulatory Visit (HOSPITAL_COMMUNITY): Payer: Self-pay | Admitting: Internal Medicine

## 2016-12-03 ENCOUNTER — Ambulatory Visit (HOSPITAL_COMMUNITY)
Admission: RE | Admit: 2016-12-03 | Discharge: 2016-12-03 | Disposition: A | Discharge status: Home / Self Care | Payer: BLUE CROSS/BLUE SHIELD | Source: Ambulatory Visit | Attending: Internal Medicine | Admitting: Internal Medicine

## 2016-12-03 DIAGNOSIS — J449 Chronic obstructive pulmonary disease, unspecified: Secondary | ICD-10-CM | POA: Diagnosis not present

## 2016-12-03 DIAGNOSIS — R059 Cough, unspecified: Secondary | ICD-10-CM

## 2016-12-03 DIAGNOSIS — R05 Cough: Secondary | ICD-10-CM

## 2017-01-11 ENCOUNTER — Encounter (INDEPENDENT_AMBULATORY_CARE_PROVIDER_SITE_OTHER): Payer: Self-pay

## 2017-01-11 ENCOUNTER — Encounter: Payer: Self-pay | Admitting: Obstetrics & Gynecology

## 2017-01-11 ENCOUNTER — Ambulatory Visit (INDEPENDENT_AMBULATORY_CARE_PROVIDER_SITE_OTHER): Payer: BLUE CROSS/BLUE SHIELD | Admitting: Obstetrics & Gynecology

## 2017-01-11 VITALS — BP 118/80 | HR 108 | Wt 215.0 lb

## 2017-01-11 DIAGNOSIS — N993 Prolapse of vaginal vault after hysterectomy: Secondary | ICD-10-CM | POA: Diagnosis not present

## 2017-01-11 DIAGNOSIS — Z4689 Encounter for fitting and adjustment of other specified devices: Secondary | ICD-10-CM | POA: Diagnosis not present

## 2017-01-11 NOTE — Progress Notes (Signed)
Chief Complaint  Patient presents with  . Follow-up    pessary    Blood pressure 118/80, pulse (!) 108, weight 215 lb (97.5 kg).  66 y.o. No obstetric history on file. No LMP recorded. Patient has had a hysterectomy. The current method of family planning is status post hysterectomy.  Outpatient Encounter Prescriptions as of 01/11/2017  Medication Sig Note  . albuterol (PROVENTIL HFA;VENTOLIN HFA) 108 (90 Base) MCG/ACT inhaler Inhale 2 puffs into the lungs every 4 (four) hours as needed for wheezing or shortness of breath.   . cyclobenzaprine (FLEXERIL) 10 MG tablet Take 10 mg by mouth 3 (three) times daily as needed for muscle spasms.    . diclofenac sodium (VOLTAREN) 1 % GEL Apply 2 g topically daily as needed (for pain/arthritis).  06/02/2016: Received from: External Pharmacy  . diphenhydrAMINE (BENADRYL) 25 mg capsule Take 25 mg by mouth every 6 (six) hours as needed.   . Fluticasone-Salmeterol (ADVAIR) 250-50 MCG/DOSE AEPB Inhale 1 puff into the lungs 2 (two) times daily.   . furosemide (LASIX) 40 MG tablet Take 40 mg by mouth daily.    Marland Kitchen gabapentin (NEURONTIN) 600 MG tablet Take 600 mg by mouth 2 (two) times daily.    Marland Kitchen KLOR-CON M10 10 MEQ tablet Take 10 mEq by mouth 2 (two) times daily. 06/02/2016: Received from: External Pharmacy  . levothyroxine (SYNTHROID, LEVOTHROID) 137 MCG tablet Take 137 mcg by mouth daily before breakfast.   . losartan (COZAAR) 100 MG tablet Take 100 mg by mouth daily.   . metoprolol tartrate (LOPRESSOR) 25 MG tablet Take 25 mg by mouth 2 (two) times daily.    Marland Kitchen morphine (MSIR) 15 MG tablet Take 15 mg by mouth 4 (four) times daily. 06/02/2016: Received from: External Pharmacy  . nitroGLYCERIN (NITROSTAT) 0.4 MG SL tablet Place 0.4 mg under the tongue every 5 (five) minutes as needed for chest pain.    . pantoprazole (PROTONIX) 40 MG tablet Take 1 tablet (40 mg total) by mouth 2 (two) times daily before a meal.   . simvastatin (ZOCOR) 40 MG tablet Take  40 mg by mouth daily.    Marland Kitchen triamcinolone cream (KENALOG) 0.1 % Apply 1 application topically 2 (two) times daily. Apply to leg   . zolpidem (AMBIEN) 5 MG tablet Take 5 mg by mouth at bedtime. 08/25/2014: Received from: [facility]:   . [DISCONTINUED] benzonatate (TESSALON) 200 MG capsule Take 1 capsule by mouth 3 (three) times daily.   . [DISCONTINUED] doxycycline (VIBRA-TABS) 100 MG tablet Take 1 tablet by mouth 2 (two) times daily.    No facility-administered encounter medications on file as of 01/11/2017.     Subjective Pt is here for routine pessary maintenance but is having pressure symptoms and concern the pessary is not working No bleeding No specific pain but just pressure type symptoms She uses a Milex ring with support #3 She has not been seen in 6 months  Objective On exam the pessary is fitting well No mucosal breakdown or pressure noted Seated properly Normal EFG  Pertinent ROS No burning with urination, frequency or urgency No nausea, vomiting or diarrhea Nor fever chills or other constitutional symptoms   Labs or studies none    Impression Diagnoses this Encounter::   ICD-10-CM   1. Pessary maintenance Z46.89   2. Prolapse of vaginal vault after hysterectomy N99.3     Established relevant diagnosis(es): Fibromyalgia, arthritis  Plan/Recommendations: Meds ordered this encounter  Medications  .  diphenhydrAMINE (BENADRYL) 25 mg capsule    Sig: Take 25 mg by mouth every 6 (six) hours as needed.    Labs or Scans Ordered: No orders of the defined types were placed in this encounter.   Management:: Spend about 25 minutes counseling pt regarding pelvic pain, arthritis fibromyalgia Her pessary is fitting properly and I do not think is contributing to her pelvic pressure pain symptoms Her prolapse would be better served to continue to use the pessary and I do not think surgery is in her best interest at this time eventaully she understands  and and will keep her pessary in  Follow up Return in about 3 months (around 04/13/2017) for Follow up, with Dr Elonda Husky.        Face to face time:  20 minutes  Greater than 50% of the visit time was spent in counseling and coordination of care with the patient.  The summary and outline of the counseling and care coordination is summarized in the note above.   All questions were answered.  Past Medical History:  Diagnosis Date  . Abnormal findings on imaging of biliary tract 1997-2000   CBD 74mm, Dr Albin Felling biliary/pancreatic manometry, EUS Dr. Ardis Hughs 09/18/07 dilated but otherwise normal extrahepatic duct, no masses  . Arthritis   . Bursitis   . CAD (coronary artery disease)   . Cervical ca ([place]) 1984  . Chronic abdominal pain    functional, seen @[facility] , extensive WU here benign   . 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 Complication of anesthesia    years ago woke up during surgery  . Constipation   . COPD (chronic obstructive pulmonary disease) ([place])   . Depression    w/ menopause  . Dysrhythmia    AFib  . GERD (gastroesophageal reflux disease)   . H/O hiatal hernia   . Hemorrhoids   . HTN (hypertension)   . Hyperlipemia   . Hypothyroidism   . MI (myocardial infarction) ([place]) 2000  . Pelvic pain in female 05/24/2015  . 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 PUD (peptic ulcer disease) 1997/1998   negative h pylori  . S/P colonoscopy 09/10/07   Dr. Donald Pore sec to tortuous colon, followed by BE & flex sig normal  . Shortness of breath    with exertion  . Skin cancer    nose  . Stroke ([place])    1983  . Tachycardia    being evaluated recently by Dr. Rollene Fare  . TIA (transient ischemic attack) 1983    Past Surgical History:  Procedure Laterality Date  . ABDOMINAL HYSTERECTOMY    . APPENDECTOMY    . arthroscopic knee    . CATARACT EXTRACTION W/PHACO Right 09/02/2014   Procedure: CATARACT EXTRACTION PHACO AND INTRAOCULAR LENS PLACEMENT  RIGHT EYE;  Surgeon: Tonny Branch, MD;  Location: [facility];  Service: Ophthalmology;  Laterality: Right;  CDE:8.96  . CATARACT EXTRACTION W/PHACO Left 09/13/2014   Procedure: CATARACT EXTRACTION PHACO AND INTRAOCULAR LENS PLACEMENT LEFT EYE;  Surgeon: Tonny Branch, MD;  Location: [facility];  Service: Ophthalmology;  Laterality: Left;  CDE 6.20  . CHOLECYSTECTOMY    . INGUINAL HERNIA REPAIR     left  . JOINT REPLACEMENT Bilateral    knees  . PARTIAL HYSTERECTOMY    . right arm     nerve  . TONSILLECTOMY    . TONSILLECTOMY    . TOTAL KNEE ARTHROPLASTY  06/20/2012   Procedure: TOTAL KNEE ARTHROPLASTY;  Surgeon:  Yvette Rack., MD;  Location: [facility];  Service: Orthopedics;  Laterality: Right;  . TOTAL KNEE ARTHROPLASTY Left 04/02/2014   Procedure: TOTAL KNEE ARTHROPLASTY;  Surgeon: Yvette Rack., MD;  Location: [facility];  Service: Orthopedics;  Laterality: Left;    OB History    No data available      Allergies  Allergen Reactions  . Iohexol Swelling     Desc: Pt states she had Cardia Cath around 2000/ experienced SOB and swelling of upper body during cath procedure/ tsf   . Nubain [Nalbuphine Hcl] Other (See Comments)    Difficulty breathing  . Latex Rash  . Penicillins Itching and Rash    Has patient had a PCN reaction causing immediate rash, facial/tongue/throat swelling, SOB or lightheadedness with hypotension: Yes Has patient had a PCN reaction causing severe rash involving mucus membranes or skin necrosis: No Has patient had a PCN reaction that required hospitalization No Has patient had a PCN reaction occurring within the last 10 years: No If all of the above answers are "NO", then may proceed with Cephalosporin use.     Social History   Social History  . Marital status: Married    Spouse name: N/A  . Number of children: 1  . Years of education: N/A   Occupational History  . 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 Smoking status: Former Smoker     Packs/day: 1.00    Years: 22.00    Types: Cigarettes    Start date: 08/06/1981    Quit date: 01/05/2004  . Smokeless tobacco: Never Used     Comment: Quit in 2005  . Alcohol use No  . Drug use: No  . Sexual activity: No     Comment: hyst   Other Topics Concern  . None   Social History Narrative  . None    Family History  Problem Relation Age of Onset  . Ulcers Brother   . Heart attack Brother   . Ulcers Sister   . Thyroid disease Daughter   . Hypertension Daughter   . Other Daughter        nervous  . Hypertension Sister   . Heart disease Sister   . Thyroid disease Sister   . Hypertension Sister   . Heart disease Sister   . Thyroid disease Sister   . Heart attack Brother   . Cancer Brother        prostate

## 2017-03-19 ENCOUNTER — Other Ambulatory Visit (HOSPITAL_COMMUNITY): Payer: Self-pay | Admitting: Orthopedic Surgery

## 2017-03-19 DIAGNOSIS — R52 Pain, unspecified: Secondary | ICD-10-CM

## 2017-03-19 DIAGNOSIS — R609 Edema, unspecified: Secondary | ICD-10-CM

## 2017-03-20 ENCOUNTER — Ambulatory Visit (HOSPITAL_COMMUNITY): Admission: RE | Admit: 2017-03-20 | Payer: BLUE CROSS/BLUE SHIELD | Source: Ambulatory Visit

## 2017-03-29 ENCOUNTER — Ambulatory Visit (HOSPITAL_COMMUNITY)
Admission: RE | Admit: 2017-03-29 | Discharge: 2017-03-29 | Disposition: A | Discharge status: Home / Self Care | Payer: BLUE CROSS/BLUE SHIELD | Source: Ambulatory Visit | Attending: Internal Medicine | Admitting: Internal Medicine

## 2017-03-29 DIAGNOSIS — R609 Edema, unspecified: Secondary | ICD-10-CM | POA: Diagnosis not present

## 2017-03-29 DIAGNOSIS — R52 Pain, unspecified: Secondary | ICD-10-CM | POA: Diagnosis not present

## 2017-04-15 ENCOUNTER — Ambulatory Visit: Payer: BLUE CROSS/BLUE SHIELD | Admitting: Obstetrics & Gynecology

## 2017-06-05 ENCOUNTER — Other Ambulatory Visit: Payer: Self-pay | Admitting: Neurosurgery

## 2017-06-05 DIAGNOSIS — M5440 Lumbago with sciatica, unspecified side: Secondary | ICD-10-CM

## 2017-06-22 ENCOUNTER — Ambulatory Visit
Admission: RE | Admit: 2017-06-22 | Discharge: 2017-06-22 | Disposition: A | Discharge status: Home / Self Care | Payer: BLUE CROSS/BLUE SHIELD | Source: Ambulatory Visit | Attending: Neurosurgery | Admitting: Neurosurgery

## 2017-06-22 DIAGNOSIS — M5440 Lumbago with sciatica, unspecified side: Secondary | ICD-10-CM

## 2017-07-10 ENCOUNTER — Ambulatory Visit: Payer: BLUE CROSS/BLUE SHIELD | Admitting: Physician Assistant

## 2017-07-10 ENCOUNTER — Encounter: Payer: Self-pay | Admitting: *Deleted

## 2017-07-10 ENCOUNTER — Encounter: Payer: Self-pay | Admitting: Physician Assistant

## 2017-07-10 ENCOUNTER — Other Ambulatory Visit (HOSPITAL_COMMUNITY): Payer: Self-pay | Admitting: Internal Medicine

## 2017-07-10 VITALS — BP 136/94 | HR 71 | Ht 63.0 in | Wt 218.8 lb

## 2017-07-10 DIAGNOSIS — I10 Essential (primary) hypertension: Secondary | ICD-10-CM | POA: Diagnosis not present

## 2017-07-10 DIAGNOSIS — Z87898 Personal history of other specified conditions: Secondary | ICD-10-CM | POA: Diagnosis not present

## 2017-07-10 DIAGNOSIS — E785 Hyperlipidemia, unspecified: Secondary | ICD-10-CM | POA: Diagnosis not present

## 2017-07-10 DIAGNOSIS — Z01818 Encounter for other preprocedural examination: Secondary | ICD-10-CM | POA: Diagnosis not present

## 2017-07-10 DIAGNOSIS — Z1231 Encounter for screening mammogram for malignant neoplasm of breast: Secondary | ICD-10-CM

## 2017-07-10 MED ORDER — ASPIRIN EC 81 MG PO TBEC: 81.0000 mg | DELAYED_RELEASE_TABLET | Freq: Every day | ORAL | 3 refills | Status: DC

## 2017-07-10 NOTE — Patient Instructions (Signed)
Medication Instructions:  Your physician recommends that you continue on your current medications as directed. Please refer to the Current Medication list given to you today.   Labwork: NONE   Testing/Procedures: Your physician has requested that you have a lexiscan myoview. For further information please visit [email or web address]. Please follow instruction sheet, as given.  Your physician has requested that you have an echocardiogram. Echocardiography is a painless test that uses sound waves to create images of your heart. It provides your doctor with information about the size and shape of your heart and how well your heart's chambers and valves are working. This procedure takes approximately one hour. There are no restrictions for this procedure.    Follow-Up: Your physician wants you to follow-up in: 1 Year with Dr. Bronson Ing. You will receive a reminder letter in the mail two months in advance. If you don't receive a letter, please call our office to schedule the follow-up appointment.   Any Other Special Instructions Will Be Listed Below (If Applicable).     If you need a refill on your cardiac medications before your next appointment, please call your pharmacy.  Thank you for choosing [facility]!

## 2017-07-10 NOTE — Progress Notes (Signed)
Cardiology Office Note    Date:  07/10/2017   ID:  Selena [name], DOB [birth year], MRN [id number]  PCP:  Asencion Noble, MD  Cardiologist: Dr. Bronson Ing  Chief Complaint  Patient presents with  . Pre-op Exam    History of Present Illness:  Selena [name] is a 66 y.o. female with history of chest pain who had a negative sub maximal stress test with mild left ventricular dilatation normal left ventricular systolic function with no convincing evidence of ischemia or infarction in 2006.  Negative cardiac catheterization approximately 13 ago per PCP notes.  2D echo in 2009 normal LVEF 55-60% no wall motion abnormalities but abnormal left ventricular relaxation.  Mild aortic regurgitation.  Patient saw Dr. Bronson Ing 06/2016 with recurrent chest pain and ordered a Lexiscan Myoview and echocardiogram that she never had done.  Patient comes in today accompanied by her daughter for cardiac clearance prior to undergoing back surgery by Dr. Saintclair Halsted.  She denies chest pain, dyspnea, dyspnea on exertion, dizziness or presyncope.  She says the last time she had chest pain was November of last year.  She described it as a tightness into her left shoulder after eating out.  She denies any recurrent chest tightness or pressure.  She is able to do work around the house but can only walk a short distance assisted because of severe back pain.  She is gained weight because of this.  She has had a prior stroke in 1983.  She has hypertension and hyperlipidemia that are treated.  She is a former smoker.  Her daughter states that she is to be extremely active but has really gone downhill in the past year as far as functional capacity.  Past Medical History:  Diagnosis Date  . Abnormal findings on imaging of biliary tract 1997-2000   CBD 4mm, Dr Albin Felling biliary/pancreatic manometry, EUS Dr. Ardis Hughs 09/18/07 dilated but otherwise normal extrahepatic duct, no masses  . Arthritis   . Bursitis   . CAD (coronary artery  disease)   . Cervical ca ([place]) 1984  . Chronic abdominal pain    functional, seen @[facility] , extensive WU here benign   . 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 Complication of anesthesia    years ago woke up during surgery  . Constipation   . COPD (chronic obstructive pulmonary disease) ([place])   . Depression    w/ menopause  . Dysrhythmia    AFib  . GERD (gastroesophageal reflux disease)   . H/O hiatal hernia   . Hemorrhoids   . HTN (hypertension)   . Hyperlipemia   . Hypothyroidism   . MI (myocardial infarction) ([place]) 2000  . Pelvic pain in female 05/24/2015  . 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 PUD (peptic ulcer disease) 1997/1998   negative h pylori  . S/P colonoscopy 09/10/07   Dr. Donald Pore sec to tortuous colon, followed by BE & flex sig normal  . Shortness of breath    with exertion  . Skin cancer    nose  . Stroke ([place])    1983  . Tachycardia    being evaluated recently by Dr. Rollene Fare  . TIA (transient ischemic attack) 1983    Past Surgical History:  Procedure Laterality Date  . ABDOMINAL HYSTERECTOMY    . APPENDECTOMY    . arthroscopic knee    . CATARACT EXTRACTION W/PHACO Right 09/02/2014   Procedure: CATARACT EXTRACTION PHACO AND INTRAOCULAR LENS PLACEMENT RIGHT EYE;  Surgeon: Levada Dy  Geoffry Paradise, MD;  Location: [facility];  Service: Ophthalmology;  Laterality: Right;  CDE:8.96  . CATARACT EXTRACTION W/PHACO Left 09/13/2014   Procedure: CATARACT EXTRACTION PHACO AND INTRAOCULAR LENS PLACEMENT LEFT EYE;  Surgeon: Tonny Branch, MD;  Location: [facility];  Service: Ophthalmology;  Laterality: Left;  CDE 6.20  . CHOLECYSTECTOMY    . INGUINAL HERNIA REPAIR     left  . JOINT REPLACEMENT Bilateral    knees  . PARTIAL HYSTERECTOMY    . right arm     nerve  . TONSILLECTOMY    . TONSILLECTOMY    . TOTAL KNEE ARTHROPLASTY  06/20/2012   Procedure: TOTAL KNEE ARTHROPLASTY;  Surgeon: Yvette Rack., MD;  Location: [facility];  Service: Orthopedics;   Laterality: Right;  . TOTAL KNEE ARTHROPLASTY Left 04/02/2014   Procedure: TOTAL KNEE ARTHROPLASTY;  Surgeon: Yvette Rack., MD;  Location: [facility];  Service: Orthopedics;  Laterality: Left;    Current Medications: Current Meds  Medication Sig  . albuterol (PROVENTIL HFA;VENTOLIN HFA) 108 (90 Base) MCG/ACT inhaler Inhale 2 puffs into the lungs every 4 (four) hours as needed for wheezing or shortness of breath.  . clobetasol (TEMOVATE) 0.05 % external solution Apply 1 application topically 2 (two) times daily as needed.  . cyclobenzaprine (FLEXERIL) 10 MG tablet Take 10 mg by mouth 3 (three) times daily as needed for muscle spasms.   Marland Kitchen desonide (DESOWEN) 0.05 % cream Apply 1 application topically 2 (two) times daily as needed.  . diclofenac sodium (VOLTAREN) 1 % GEL Apply 2 g topically daily as needed (for pain/arthritis).   Marland Kitchen diphenhydrAMINE (BENADRYL) 25 mg capsule Take 25 mg by mouth every 6 (six) hours as needed.  . Fluticasone-Salmeterol (ADVAIR) 250-50 MCG/DOSE AEPB Inhale 1 puff into the lungs 2 (two) times daily.  . furosemide (LASIX) 40 MG tablet Take 40 mg by mouth daily.   Marland Kitchen GRALISE 600 MG TABS Take 1 tablet by mouth daily.  Marland Kitchen KLOR-CON M10 10 MEQ tablet Take 10 mEq by mouth 2 (two) times daily.  Marland Kitchen levothyroxine (SYNTHROID, LEVOTHROID) 137 MCG tablet Take 137 mcg by mouth daily before breakfast.  . levothyroxine (SYNTHROID, LEVOTHROID) 150 MCG tablet Take 1 tablet by mouth daily.  Marland Kitchen losartan (COZAAR) 100 MG tablet Take 100 mg by mouth daily.  . metoprolol tartrate (LOPRESSOR) 25 MG tablet Take 25 mg by mouth 2 (two) times daily.   Marland Kitchen morphine (MSIR) 15 MG tablet Take 15 mg by mouth 4 (four) times daily.  Marland Kitchen MOVANTIK 25 MG TABS tablet Take 1 tablet by mouth daily.  . nitroGLYCERIN (NITROSTAT) 0.4 MG SL tablet Place 0.4 mg under the tongue every 5 (five) minutes as needed for chest pain.   . pantoprazole (PROTONIX) 40 MG tablet Take 1 tablet (40 mg total) by mouth 2 (two) times daily  before a meal.  . PREMARIN vaginal cream Place 0.5 g vaginally once a week.  . simvastatin (ZOCOR) 40 MG tablet Take 40 mg by mouth daily.   Marland Kitchen triamcinolone cream (KENALOG) 0.1 % Apply 1 application topically 2 (two) times daily. Apply to leg  . zolpidem (AMBIEN) 5 MG tablet Take 5 mg by mouth at bedtime.     Allergies:   Iohexol; Nubain [nalbuphine hcl]; Latex; and Penicillins   Social History   Socioeconomic History  . Marital status: Married    Spouse name: None  . Number of children: 1  . Years of education: None  . Highest education level: None  Social  Needs  . Financial resource strain: None  . Food insecurity - worry: None  . Food insecurity - inability: None  . Transportation needs - medical: None  . Transportation needs - non-medical: None  Occupational History  . Occupation: Training and development officer: [employer]  Tobacco Use  . Smoking status: Former Smoker    Packs/day: 1.00    Years: 22.00    Pack years: 22.00    Types: Cigarettes    Start date: 08/06/1981    Last attempt to quit: 01/05/2004    Years since quitting: 13.5  . Smokeless tobacco: Never Used  . Tobacco comment: Quit in 2005  Substance and Sexual Activity  . Alcohol use: No    Alcohol/week: 0.0 oz  . Drug use: No  . Sexual activity: No    Birth control/protection: Surgical    Comment: hyst  Other Topics Concern  . None  Social History Narrative  . None     Family History:  The patient's family history includes Cancer in her brother; Heart attack in her brother and brother; Heart disease in her sister and sister; Hypertension in her daughter, sister, and sister; Other in her daughter; Thyroid disease in her daughter, sister, and sister; Ulcers in her brother and sister.   ROS:   Please see the history of present illness.    Review of Systems  Constitution: Positive for weight gain.  HENT: Negative.   Eyes: Negative.   Cardiovascular: Negative.   Respiratory: Negative.     Hematologic/Lymphatic: Negative.   Musculoskeletal: Positive for back pain. Negative for joint pain.  Gastrointestinal: Negative.   Genitourinary: Negative.   Neurological: Negative.    All other systems reviewed and are negative.   PHYSICAL EXAM:   VS:  BP (!) 136/94   Pulse 71   Ht 5\' 3"  (1.6 m)   Wt 218 lb 12.8 oz (99.2 kg)   SpO2 97% Comment: on room air  BMI 38.76 kg/m   Physical Exam  GEN: Well nourished, well developed, in no acute distress  Neck: no JVD, carotid bruits, or masses Cardiac:RRR; 2/6 diastolic murmur at left sternal border Respiratory: Decreased breath sounds but clear to auscultation bilaterally, normal work of breathing GI: soft, nontender, nondistended, + BS Ext: without cyanosis, clubbing, or edema, Good distal pulses bilaterally Neuro:  Alert and Oriented x 3 Psych: euthymic mood, full affect  Wt Readings from Last 3 Encounters:  07/10/17 218 lb 12.8 oz (99.2 kg)  01/11/17 215 lb (97.5 kg)  09/22/16 200 lb (90.7 kg)      Studies/Labs Reviewed:   EKG:  EKG is  ordered today.  The ekg ordered today demonstrates normal sinus rhythm, no acute change  Recent Labs: No results found for requested labs within last 8760 hours.   Lipid Panel    Component Value Date/Time   CHOL  05/14/2008 0500    180        ATP III CLASSIFICATION:  <200     mg/dL   Desirable  200-239  mg/dL   Borderline High  >=240    mg/dL   High   TRIG 296 (H) 05/14/2008 0500   HDL 41 05/14/2008 0500   CHOLHDL 4.4 05/14/2008 0500   VLDL 59 (H) 05/14/2008 0500   LDLCALC  05/14/2008 0500    80        Total Cholesterol/HDL:CHD Risk Coronary Heart Disease Risk Table  Men   Women  1/2 Average Risk   3.4   3.3    Additional studies/ records that were reviewed today include:  2D echo 2009    ASSESSMENT:    1. Preoperative clearance   2. History of chest pain   3. Essential hypertension   4. Hyperlipidemia, unspecified hyperlipidemia type       PLAN:  In order of problems listed above:  Preoperative clearance before undergoing back surgery.  Patient has a long history of chest pain and had a remote negative cardiac catheterization approximately 13 years ago.  Had a negative sub-maximal stress test with mild left ventricular dilatation and normal systolic function in 1308 and last echo in 2009 normal LVEF 55-60% with abnormal left ventricular relaxation and mild aortic insufficiency.  Chest pressure 06/2016 recommended stress test and echo that she never had done.  Because of her history of chest pain and valvular heart disease and no recent workup recommend proceeding with Lexiscan Myoview and 2D echo prior to clearing her for surgery.  According to the Revised Cardiac Risk Index (RCRI), her Perioperative Risk of Major Cardiac Event is (%): 0.9  Her Functional Capacity in METs is: 6.05 according to the Duke Activity Status Index (DASI).  Long history of chest pain but no recent complaints since 06/2016.    Hypertension blood pressure is up today but she is in pain.  This is managed by primary care  Hyperlipidemia on atorvastatin managed by primary care.     Medication Adjustments/Labs and Tests Ordered: Current medicines are reviewed at length with the patient today.  Concerns regarding medicines are outlined above.  Medication changes, Labs and Tests ordered today are listed in the Patient Instructions below. Patient Instructions  Medication Instructions:  Your physician recommends that you continue on your current medications as directed. Please refer to the Current Medication list given to you today.   Labwork: NONE   Testing/Procedures: Your physician has requested that you have a lexiscan myoview. For further information please visit [email or web address]. Please follow instruction sheet, as given.  Your physician has requested that you have an echocardiogram. Echocardiography is a painless test that uses sound  waves to create images of your heart. It provides your doctor with information about the size and shape of your heart and how well your heart's chambers and valves are working. This procedure takes approximately one hour. There are no restrictions for this procedure.    Follow-Up: Your physician wants you to follow-up in: 1 Year with Dr. Bronson Ing. You will receive a reminder letter in the mail two months in advance. If you don't receive a letter, please call our office to schedule the follow-up appointment.   Any Other Special Instructions Will Be Listed Below (If Applicable).     If you need a refill on your cardiac medications before your next appointment, please call your pharmacy.  Thank you for choosing [facility]!      Signed, Ermalinda Barrios, PA-C  07/10/2017 1:13 PM    [facility] [place], [place], [place]  [address] Phone: [phone]; Fax: [phone]

## 2017-07-11 ENCOUNTER — Ambulatory Visit (HOSPITAL_COMMUNITY): Payer: BLUE CROSS/BLUE SHIELD

## 2017-07-18 ENCOUNTER — Encounter (HOSPITAL_COMMUNITY)
Admission: RE | Admit: 2017-07-18 | Discharge: 2017-07-18 | Disposition: A | Discharge status: Home / Self Care | Payer: BLUE CROSS/BLUE SHIELD | Source: Ambulatory Visit | Attending: Physician Assistant | Admitting: Physician Assistant

## 2017-07-18 ENCOUNTER — Encounter (HOSPITAL_BASED_OUTPATIENT_CLINIC_OR_DEPARTMENT_OTHER)
Admission: RE | Admit: 2017-07-18 | Discharge: 2017-07-18 | Disposition: A | Discharge status: Home / Self Care | Payer: BLUE CROSS/BLUE SHIELD | Source: Ambulatory Visit | Attending: Physician Assistant | Admitting: Physician Assistant

## 2017-07-18 ENCOUNTER — Encounter (HOSPITAL_COMMUNITY): Payer: Self-pay

## 2017-07-18 ENCOUNTER — Ambulatory Visit (HOSPITAL_BASED_OUTPATIENT_CLINIC_OR_DEPARTMENT_OTHER)
Admission: RE | Admit: 2017-07-18 | Discharge: 2017-07-18 | Disposition: A | Discharge status: Home / Self Care | Payer: BLUE CROSS/BLUE SHIELD | Source: Ambulatory Visit | Attending: Physician Assistant | Admitting: Physician Assistant

## 2017-07-18 DIAGNOSIS — Z01818 Encounter for other preprocedural examination: Secondary | ICD-10-CM

## 2017-07-18 DIAGNOSIS — E785 Hyperlipidemia, unspecified: Secondary | ICD-10-CM

## 2017-07-18 DIAGNOSIS — I2510 Atherosclerotic heart disease of native coronary artery without angina pectoris: Secondary | ICD-10-CM | POA: Insufficient documentation

## 2017-07-18 DIAGNOSIS — K219 Gastro-esophageal reflux disease without esophagitis: Secondary | ICD-10-CM | POA: Insufficient documentation

## 2017-07-18 DIAGNOSIS — I10 Essential (primary) hypertension: Secondary | ICD-10-CM | POA: Insufficient documentation

## 2017-07-18 LAB — ECHOCARDIOGRAM COMPLETE
AO mean calculated velocity dopler: 107 cm/s
AOPV: 0.5 m/s
AOVTI: 35.2 cm
AV Area VTI index: 0.42 cm2/m2
AV Area VTI: 0.89 cm2
AV Area mean vel: 0.83 cm2
AV Mean grad: 6 mmHg
AV Peak grad: 13 mmHg
AV VEL mean LVOT/AV: 0.47
AV pk vel: 177 cm/s
AV vel: 0.9
AVAREAMEANVIN: 0.39 cm2/m2
CHL CUP AV PEAK INDEX: 0.41
CHL CUP MV DEC (S): 444
CHL CUP RV SYS PRESS: 28 mmHg
CHL CUP STROKE VOLUME: 43 mL
CHL CUP TV REG PEAK VELOCITY: 250 cm/s
E decel time: 444 msec
E/e' ratio: 11.18
FS: 43 % (ref 28–44)
IV/PV OW: 1.02
LA vol A4C: 50.6 ml
LA vol index: 27.8 mL/m2
LADIAMINDEX: 1.53 cm/m2
LASIZE: 33 mm
LAVOL: 59.7 mL
LEFT ATRIUM END SYS DIAM: 33 mm
LV E/e'average: 11.18
LV dias vol index: 35 mL/m2
LV dias vol: 75 mL (ref 46–106)
LV e' LATERAL: 4.9 cm/s
LV sys vol: 32 mL
LVEEMED: 11.18
LVOT SV: 32 mL
LVOT VTI: 17.8 cm
LVOT area: 1.77 cm2
LVOT diameter: 15 mm
LVOT peak VTI: 0.51 cm
LVOT peak grad rest: 3 mmHg
LVOT peak vel: 88.8 cm/s
LVSYSVOLIN: 15 mL/m2
MV pk A vel: 84.8 m/s
MV pk E vel: 54.8 m/s
PW: 13.8 mm — AB (ref 0.6–1.1)
Simpson's disk: 58
TAPSE: 25.2 mm
TDI e' lateral: 4.9
TDI e' medial: 4.9
TRMAXVEL: 250 cm/s
Valve area index: 0.42
Valve area: 0.9 cm2

## 2017-07-18 LAB — NM MYOCAR MULTI W/SPECT W/WALL MOTION / EF
LHR: 0.47
LVDIAVOL: 72 mL (ref 46–106)
LVSYSVOL: 49 mL
Peak HR: 95 {beats}/min
Rest HR: 75 {beats}/min
SDS: 1
SRS: 4
SSS: 5
TID: 1.49

## 2017-07-18 MED ORDER — SODIUM CHLORIDE 0.9% FLUSH
INTRAVENOUS | Status: AC
  Administered 2017-07-18: 10 mL via INTRAVENOUS
  Filled 2017-07-18: qty 10

## 2017-07-18 MED ORDER — REGADENOSON 0.4 MG/5ML IV SOLN
INTRAVENOUS | Status: AC
  Administered 2017-07-18: 0.4 mg via INTRAVENOUS
  Filled 2017-07-18: qty 5

## 2017-07-18 MED ORDER — TECHNETIUM TC 99M TETROFOSMIN IV KIT
30.0000 | PACK | Freq: Once | INTRAVENOUS | Status: AC | PRN
  Administered 2017-07-18: 30 via INTRAVENOUS

## 2017-07-18 MED ORDER — TECHNETIUM TC 99M TETROFOSMIN IV KIT
10.0000 | PACK | Freq: Once | INTRAVENOUS | Status: AC | PRN
  Administered 2017-07-18: 10 via INTRAVENOUS

## 2017-07-18 NOTE — Progress Notes (Signed)
*  PRELIMINARY RESULTS* Echocardiogram 2D Echocardiogram has been performed.  Selena [name] 07/18/2017, 10:06 AM

## 2017-07-22 ENCOUNTER — Telehealth: Payer: Self-pay | Admitting: *Deleted

## 2017-07-22 NOTE — Telephone Encounter (Signed)
-----   Message from Imogene Burn, PA-C sent at 07/19/2017 12:01 PM EST ----- Can you let the patient know that her nuclear stress test had some mild ischemia. Please send a note to Dr. Saintclair Halsted that she's at an acceptable risk. 2Decho showed normal LV function. Dr. Bronson Ing reviewed it and the echo and feels the patient can proceed with back surgery .

## 2017-07-22 NOTE — Telephone Encounter (Signed)
Called patient with test results. No answer. Left message to call back.  

## 2017-07-24 ENCOUNTER — Telehealth: Payer: Self-pay | Admitting: Physician Assistant

## 2017-07-24 NOTE — Telephone Encounter (Signed)
Pt called stating that Dr. Saintclair Halsted hasn't heard anything concerning her surgical clearance

## 2017-07-31 ENCOUNTER — Other Ambulatory Visit (HOSPITAL_COMMUNITY): Payer: Self-pay | Admitting: Internal Medicine

## 2017-07-31 DIAGNOSIS — Z1231 Encounter for screening mammogram for malignant neoplasm of breast: Secondary | ICD-10-CM

## 2017-08-05 ENCOUNTER — Ambulatory Visit (HOSPITAL_COMMUNITY)
Admission: RE | Admit: 2017-08-05 | Discharge: 2017-08-05 | Disposition: A | Discharge status: Home / Self Care | Payer: BLUE CROSS/BLUE SHIELD | Source: Ambulatory Visit | Attending: Internal Medicine | Admitting: Internal Medicine

## 2017-08-05 DIAGNOSIS — Z1231 Encounter for screening mammogram for malignant neoplasm of breast: Secondary | ICD-10-CM | POA: Diagnosis present

## 2017-08-12 ENCOUNTER — Other Ambulatory Visit: Payer: Self-pay | Admitting: Neurosurgery

## 2017-08-14 ENCOUNTER — Telehealth: Payer: Self-pay | Admitting: *Deleted

## 2017-08-14 NOTE — Telephone Encounter (Signed)
Selena [name] with [facility] Notified that pt can proceed with back surgery. Echo and stress faxed to [phone].

## 2017-08-26 ENCOUNTER — Inpatient Hospital Stay (HOSPITAL_COMMUNITY): Admission: RE | Admit: 2017-08-26 | Payer: BLUE CROSS/BLUE SHIELD | Source: Ambulatory Visit

## 2017-08-27 NOTE — Pre-Procedure Instructions (Signed)
Selena [name]  08/27/2017      [id number] - [place], [place] [address] [place] [address] Phone: [phone] Fax: [phone]    Your procedure is scheduled on Mon. Jan. 28   Report to [facility] Admitting at 10:40 A.M.  Call this number if you have problems the morning of surgery:  [phone]   Remember:  Do not eat food or drink liquids after midnight on Sun. Jan. 27    Take these medicines the morning of surgery with A SIP OF WATER : albuterol inhaler-bring to hospital, cyclobenzaprine (flexeril) if needed, advir-bring to hospital, levothyroxine (synthroid), metoroplol tartrate (lopressor),mirabergron (myrbetriq) morphine if needed, nitroglycerine if needed, pantoprazole (protonix)             7 days prior to surgery STOP taking any Aspirin(unless otherwise instructed by your surgeon), Aleve, Naproxen, Ibuprofen, Motrin, Advil, Goody's, BC's, all herbal medications, fish oil, and all vitamins   Do not wear jewelry, make-up or nail polish.  Do not wear lotions, powders, or perfumes, or deodorant.  Do not shave 48 hours prior to surgery.  Men may shave face and neck.  Do not bring valuables to the hospital.  [facility] is not responsible for any belongings or valuables.  Contacts, dentures or bridgework may not be worn into surgery.  Leave your suitcase in the car.  After surgery it may be brought to your room.  For patients admitted to the hospital, discharge time will be determined by your treatment team.  Patients discharged the day of surgery will not be allowed to drive home.    Special instructions:  [facility]- Preparing For Surgery  Before surgery, you can play an important role. Because skin is not sterile, your skin needs to be as free of germs as possible. You can reduce the number of germs on your skin by washing with CHG (chlorahexidine gluconate) Soap before surgery.  CHG is an antiseptic  cleaner which kills germs and bonds with the skin to continue killing germs even after washing.  Please do not use if you have an allergy to CHG or antibacterial soaps. If your skin becomes reddened/irritated stop using the CHG.  Do not shave (including legs and underarms) for at least 48 hours prior to first CHG shower. It is OK to shave your face.  Please follow these instructions carefully.   1. Shower the NIGHT BEFORE SURGERY and the MORNING OF SURGERY with CHG.   2. If you chose to wash your hair, wash your hair first as usual with your normal shampoo.  3. After you shampoo, rinse your hair and body thoroughly to remove the shampoo.  4. Use CHG as you would any other liquid soap. You can apply CHG directly to the skin and wash gently with a scrungie or a clean washcloth.   5. Apply the CHG Soap to your body ONLY FROM THE NECK DOWN.  Do not use on open wounds or open sores. Avoid contact with your eyes, ears, mouth and genitals (private parts). Wash Face and genitals (private parts)  with your normal soap.  6. Wash thoroughly, paying special attention to the area where your surgery will be performed.  7. Thoroughly rinse your body with warm water from the neck down.  8. DO NOT shower/wash with your normal soap after using and rinsing off the CHG Soap.  9. Pat yourself dry with a CLEAN TOWEL.  10. Wear  CLEAN PAJAMAS to bed the night before surgery, wear comfortable clothes the morning of surgery  11. Place CLEAN SHEETS on your bed the night of your first shower and DO NOT SLEEP WITH PETS.    Day of Surgery: Do not apply any deodorants/lotions. Please wear clean clothes to the hospital/surgery center.      Please read over the following fact sheets that you were given. Coughing and Deep Breathing, MRSA Information and Surgical Site Infection Prevention

## 2017-08-28 ENCOUNTER — Encounter (HOSPITAL_COMMUNITY)
Admission: RE | Admit: 2017-08-28 | Discharge: 2017-08-28 | Disposition: A | Discharge status: Home / Self Care | Payer: BLUE CROSS/BLUE SHIELD | Source: Ambulatory Visit | Attending: Neurosurgery | Admitting: Neurosurgery

## 2017-08-28 ENCOUNTER — Encounter (HOSPITAL_COMMUNITY): Payer: Self-pay

## 2017-08-28 ENCOUNTER — Other Ambulatory Visit: Payer: Self-pay

## 2017-08-28 DIAGNOSIS — J449 Chronic obstructive pulmonary disease, unspecified: Secondary | ICD-10-CM | POA: Insufficient documentation

## 2017-08-28 DIAGNOSIS — Z79899 Other long term (current) drug therapy: Secondary | ICD-10-CM | POA: Insufficient documentation

## 2017-08-28 DIAGNOSIS — Z7982 Long term (current) use of aspirin: Secondary | ICD-10-CM | POA: Insufficient documentation

## 2017-08-28 DIAGNOSIS — Z01812 Encounter for preprocedural laboratory examination: Secondary | ICD-10-CM | POA: Diagnosis not present

## 2017-08-28 DIAGNOSIS — I2510 Atherosclerotic heart disease of native coronary artery without angina pectoris: Secondary | ICD-10-CM | POA: Insufficient documentation

## 2017-08-28 DIAGNOSIS — E039 Hypothyroidism, unspecified: Secondary | ICD-10-CM | POA: Diagnosis not present

## 2017-08-28 DIAGNOSIS — I10 Essential (primary) hypertension: Secondary | ICD-10-CM | POA: Insufficient documentation

## 2017-08-28 DIAGNOSIS — Z87891 Personal history of nicotine dependence: Secondary | ICD-10-CM | POA: Insufficient documentation

## 2017-08-28 DIAGNOSIS — E785 Hyperlipidemia, unspecified: Secondary | ICD-10-CM | POA: Diagnosis not present

## 2017-08-28 DIAGNOSIS — I4891 Unspecified atrial fibrillation: Secondary | ICD-10-CM | POA: Diagnosis not present

## 2017-08-28 HISTORY — DX: Cardiac murmur, unspecified: R01.1

## 2017-08-28 HISTORY — DX: Headache: R51

## 2017-08-28 HISTORY — DX: Headache, unspecified: R51.9

## 2017-08-28 LAB — TYPE AND SCREEN
ABO/RH(D): B POS
Antibody Screen: NEGATIVE

## 2017-08-28 LAB — BASIC METABOLIC PANEL
Anion gap: 12 (ref 5–15)
BUN: 17 mg/dL (ref 6–20)
CALCIUM: 9.6 mg/dL (ref 8.9–10.3)
CO2: 23 mmol/L (ref 22–32)
CREATININE: 0.94 mg/dL (ref 0.44–1.00)
Chloride: 105 mmol/L (ref 101–111)
GFR calc non Af Amer: >60 mL/min (ref >60)
GLUCOSE: 103 mg/dL — AB (ref 65–99)
Potassium: 4.2 mmol/L (ref 3.5–5.1)
Sodium: 140 mmol/L (ref 135–145)

## 2017-08-28 LAB — CBC
HCT: 43.8 % (ref 36.0–46.0)
Hemoglobin: 14.2 g/dL (ref 12.0–15.0)
MCH: 27 pg (ref 26.0–34.0)
MCHC: 32.4 g/dL (ref 30.0–36.0)
MCV: 83.3 fL (ref 78.0–100.0)
Platelets: 283 10*3/uL (ref 150–400)
RBC: 5.26 MIL/uL — AB (ref 3.87–5.11)
RDW: 14.9 % (ref 11.5–15.5)
WBC: 9.8 10*3/uL (ref 4.0–10.5)

## 2017-08-28 LAB — SURGICAL PCR SCREEN
MRSA, PCR: NEGATIVE
STAPHYLOCOCCUS AUREUS: NEGATIVE

## 2017-08-28 NOTE — Progress Notes (Signed)
PCP - Asencion Noble, last office note requested Cardiologist - Estella Husk, cardiac clearance in epic  Chest x-ray - 12/03/16 EKG - 07/10/17 Stress Test - 07/18/17 ECHO - 07/18/17 Cardiac Cath - 2005- under notes section 12/19/10  Sleep Study - 4/16, pt denies sleep apnea   Aspirin Instructions: Pt stopped Aspirin 1 week prior to surgery  Anesthesia review: cardiac hx, pt reports last chest pain was last November. No problems since.   Patient denies shortness of breath, fever, cough and chest pain at PAT appointment   Patient verbalized understanding of instructions that were given to them at the PAT appointment. Patient was also instructed that they will need to review over the PAT instructions again at home before surgery.

## 2017-08-29 NOTE — Progress Notes (Signed)
Anesthesia Chart Review:  Pt is a 67 year old female scheduled for posterior lumbar fusion, L4-5 laminectomy and foraminotomy on 09/02/2017 with Kary Kos, MD  - PCP is Asencion Noble, MD - Cardiologist is Kate Sable, MD. Last office visit 07/10/17 with Ermalinda Barrios, PA.  Pt cleared for surgery 07/22/17 after stress test and echo (results below)  PMH includes: CAD (normal coronaries by 03/21/04 cath)), HTN, hyperlipidemia, tachycardia, atrial fibrillation, hypothyroidism, COPD.  Former smoker.  BMI 36.  S/p cataract extraction 09/13/14 and 09/02/14. S/p L TKA 03/30/14. S/p R TKA 06/20/12  Medications include: Albuterol, ASA 81 mg, Advair, Lasix, potassium, levothyroxine, losartan, metoprolol, morphine, Protonix, simvastatin  BP 124/81   Pulse 94   Temp 36.7 C   Resp 20   Ht 5\' 4"  (1.626 m)   Wt 210 lb 8 oz (95.5 kg)   SpO2 95%   BMI 36.13 kg/m   Preoperative labs reviewed.    CXR 12/03/16: COPD without acute abnormality.  EKG 07/10/17: NSR.  Low voltage QRS.  Nuclear stress test 07/18/17:   There was no ST segment deviation noted during stress.  Findings consistent with very mild apical ischemia verse differences in apical thinning. Either finding alone is low risk from a perfusion standpoint.  This is a high risk study. High risk based on decreased LVEF and elevated TID of 1.49. There is no evidence of significant ischemia by perfusion imaging itself, however with decreased LVEF and elevated TID there may be balanced ischemia. Recommend correlating LVEF with echo.  The left ventricular ejection fraction is moderately decreased (30-44%). LVEF is 32%  Echo 07/18/17:  - Left ventricle: The cavity size was normal. Wall thickness was increased in a pattern of moderate LVH. Systolic function was normal. The estimated ejection fraction was in the range of 55% to 60%. Wall motion was normal; there were no regional wall motion abnormalities. Doppler parameters are consistent with abnormal  left ventricular relaxation (grade 1 diastolic dysfunction). - Aortic valve: There was mild regurgitation. Valve area (VTI): 1.92 cm^2. Valve area (Vmax): 1.91 cm^2. Valve area (Vmean): 1.78 cm^2. - Technically adequate study.  Cardiac cath 03/21/04:  1. Normal coronary angiography. 2. Abnormal electrocardiogram and questionable apical wall hypokinesis to akinesis of uncertain etiology.  If no changes, I anticipate pt can proceed with surgery as scheduled.   Willeen Cass, FNP-BC [facility]/Anesthesiology Phone: [phone] 08/29/2017 11:02 AM

## 2017-09-02 ENCOUNTER — Encounter (HOSPITAL_COMMUNITY): Payer: Self-pay

## 2017-09-02 ENCOUNTER — Other Ambulatory Visit: Payer: Self-pay

## 2017-09-02 ENCOUNTER — Inpatient Hospital Stay (HOSPITAL_COMMUNITY)
Admission: RE | Admit: 2017-09-02 | Discharge: 2017-09-03 | DRG: 455 | Disposition: A | Discharge status: Home / Self Care | Payer: BLUE CROSS/BLUE SHIELD | Source: Ambulatory Visit | Attending: Neurosurgery | Admitting: Neurosurgery

## 2017-09-02 ENCOUNTER — Inpatient Hospital Stay (HOSPITAL_COMMUNITY): Payer: BLUE CROSS/BLUE SHIELD | Admitting: Anesthesiology

## 2017-09-02 ENCOUNTER — Encounter (HOSPITAL_COMMUNITY)
Admission: RE | Disposition: A | Discharge status: Home / Self Care | Payer: Self-pay | Source: Ambulatory Visit | Attending: Neurosurgery

## 2017-09-02 ENCOUNTER — Inpatient Hospital Stay (HOSPITAL_COMMUNITY): Payer: BLUE CROSS/BLUE SHIELD

## 2017-09-02 ENCOUNTER — Inpatient Hospital Stay (HOSPITAL_COMMUNITY): Payer: BLUE CROSS/BLUE SHIELD | Admitting: Emergency Medicine

## 2017-09-02 DIAGNOSIS — Z885 Allergy status to narcotic agent status: Secondary | ICD-10-CM

## 2017-09-02 DIAGNOSIS — I252 Old myocardial infarction: Secondary | ICD-10-CM

## 2017-09-02 DIAGNOSIS — Z7951 Long term (current) use of inhaled steroids: Secondary | ICD-10-CM

## 2017-09-02 DIAGNOSIS — K219 Gastro-esophageal reflux disease without esophagitis: Secondary | ICD-10-CM | POA: Diagnosis present

## 2017-09-02 DIAGNOSIS — R402252 Coma scale, best verbal response, oriented, at arrival to emergency department: Secondary | ICD-10-CM | POA: Diagnosis present

## 2017-09-02 DIAGNOSIS — M4316 Spondylolisthesis, lumbar region: Secondary | ICD-10-CM | POA: Diagnosis present

## 2017-09-02 DIAGNOSIS — Z7982 Long term (current) use of aspirin: Secondary | ICD-10-CM

## 2017-09-02 DIAGNOSIS — Z8249 Family history of ischemic heart disease and other diseases of the circulatory system: Secondary | ICD-10-CM | POA: Diagnosis not present

## 2017-09-02 DIAGNOSIS — Z96653 Presence of artificial knee joint, bilateral: Secondary | ICD-10-CM | POA: Diagnosis present

## 2017-09-02 DIAGNOSIS — J449 Chronic obstructive pulmonary disease, unspecified: Secondary | ICD-10-CM | POA: Diagnosis present

## 2017-09-02 DIAGNOSIS — E785 Hyperlipidemia, unspecified: Secondary | ICD-10-CM | POA: Diagnosis present

## 2017-09-02 DIAGNOSIS — Z8711 Personal history of peptic ulcer disease: Secondary | ICD-10-CM | POA: Diagnosis not present

## 2017-09-02 DIAGNOSIS — Z8349 Family history of other endocrine, nutritional and metabolic diseases: Secondary | ICD-10-CM

## 2017-09-02 DIAGNOSIS — I4891 Unspecified atrial fibrillation: Secondary | ICD-10-CM | POA: Diagnosis present

## 2017-09-02 DIAGNOSIS — Z90710 Acquired absence of both cervix and uterus: Secondary | ICD-10-CM

## 2017-09-02 DIAGNOSIS — I10 Essential (primary) hypertension: Secondary | ICD-10-CM | POA: Diagnosis present

## 2017-09-02 DIAGNOSIS — Z9842 Cataract extraction status, left eye: Secondary | ICD-10-CM | POA: Diagnosis not present

## 2017-09-02 DIAGNOSIS — Z87891 Personal history of nicotine dependence: Secondary | ICD-10-CM

## 2017-09-02 DIAGNOSIS — Z85828 Personal history of other malignant neoplasm of skin: Secondary | ICD-10-CM

## 2017-09-02 DIAGNOSIS — M48061 Spinal stenosis, lumbar region without neurogenic claudication: Principal | ICD-10-CM | POA: Diagnosis present

## 2017-09-02 DIAGNOSIS — R402362 Coma scale, best motor response, obeys commands, at arrival to emergency department: Secondary | ICD-10-CM | POA: Diagnosis present

## 2017-09-02 DIAGNOSIS — E039 Hypothyroidism, unspecified: Secondary | ICD-10-CM | POA: Diagnosis present

## 2017-09-02 DIAGNOSIS — R402142 Coma scale, eyes open, spontaneous, at arrival to emergency department: Secondary | ICD-10-CM | POA: Diagnosis present

## 2017-09-02 DIAGNOSIS — Z9841 Cataract extraction status, right eye: Secondary | ICD-10-CM | POA: Diagnosis not present

## 2017-09-02 DIAGNOSIS — Z8541 Personal history of malignant neoplasm of cervix uteri: Secondary | ICD-10-CM | POA: Diagnosis not present

## 2017-09-02 DIAGNOSIS — Z9049 Acquired absence of other specified parts of digestive tract: Secondary | ICD-10-CM

## 2017-09-02 DIAGNOSIS — Z880 Allergy status to penicillin: Secondary | ICD-10-CM

## 2017-09-02 DIAGNOSIS — Z6836 Body mass index (BMI) 36.0-36.9, adult: Secondary | ICD-10-CM

## 2017-09-02 DIAGNOSIS — Z8042 Family history of malignant neoplasm of prostate: Secondary | ICD-10-CM

## 2017-09-02 DIAGNOSIS — Z79890 Hormone replacement therapy (postmenopausal): Secondary | ICD-10-CM

## 2017-09-02 DIAGNOSIS — M1990 Unspecified osteoarthritis, unspecified site: Secondary | ICD-10-CM | POA: Diagnosis present

## 2017-09-02 DIAGNOSIS — M79605 Pain in left leg: Secondary | ICD-10-CM | POA: Diagnosis present

## 2017-09-02 DIAGNOSIS — I2510 Atherosclerotic heart disease of native coronary artery without angina pectoris: Secondary | ICD-10-CM | POA: Diagnosis present

## 2017-09-02 DIAGNOSIS — Z888 Allergy status to other drugs, medicaments and biological substances status: Secondary | ICD-10-CM

## 2017-09-02 DIAGNOSIS — Z961 Presence of intraocular lens: Secondary | ICD-10-CM | POA: Diagnosis present

## 2017-09-02 DIAGNOSIS — F329 Major depressive disorder, single episode, unspecified: Secondary | ICD-10-CM | POA: Diagnosis present

## 2017-09-02 DIAGNOSIS — Z8673 Personal history of transient ischemic attack (TIA), and cerebral infarction without residual deficits: Secondary | ICD-10-CM

## 2017-09-02 DIAGNOSIS — Z419 Encounter for procedure for purposes other than remedying health state, unspecified: Secondary | ICD-10-CM

## 2017-09-02 DIAGNOSIS — Z91040 Latex allergy status: Secondary | ICD-10-CM

## 2017-09-02 LAB — POCT I-STAT 4, (NA,K, GLUC, HGB,HCT)
Glucose, Bld: 139 mg/dL — ABNORMAL HIGH (ref 65–99)
HCT: 31 % — ABNORMAL LOW (ref 36.0–46.0)
HEMOGLOBIN: 10.5 g/dL — AB (ref 12.0–15.0)
POTASSIUM: 4.2 mmol/L (ref 3.5–5.1)
SODIUM: 139 mmol/L (ref 135–145)

## 2017-09-02 SURGERY — POSTERIOR LUMBAR FUSION 1 LEVEL
Anesthesia: General | Site: Back

## 2017-09-02 MED ORDER — MIDAZOLAM HCL 2 MG/2ML IJ SOLN
INTRAMUSCULAR | Status: AC
  Filled 2017-09-02: qty 2

## 2017-09-02 MED ORDER — SODIUM CHLORIDE 0.9% FLUSH: 3.0000 mL | INTRAVENOUS | Status: DC | PRN

## 2017-09-02 MED ORDER — LOSARTAN POTASSIUM 50 MG PO TABS
100.0000 mg | ORAL_TABLET | Freq: Every day | ORAL | Status: DC
  Administered 2017-09-02: 100 mg via ORAL
  Filled 2017-09-02: qty 2

## 2017-09-02 MED ORDER — PANTOPRAZOLE SODIUM 40 MG PO TBEC
40.0000 mg | DELAYED_RELEASE_TABLET | Freq: Two times a day (BID) | ORAL | Status: DC
  Administered 2017-09-02 – 2017-09-03 (×2): 40 mg via ORAL
  Filled 2017-09-02 (×2): qty 1

## 2017-09-02 MED ORDER — MIDAZOLAM HCL 5 MG/5ML IJ SOLN
INTRAMUSCULAR | Status: DC | PRN
  Administered 2017-09-02: 2 mg via INTRAVENOUS

## 2017-09-02 MED ORDER — THROMBIN (RECOMBINANT) 5000 UNITS EX SOLR
CUTANEOUS | Status: AC
  Filled 2017-09-02: qty 5000

## 2017-09-02 MED ORDER — ESTROGENS, CONJUGATED 0.625 MG/GM VA CREA
0.5000 g | TOPICAL_CREAM | VAGINAL | Status: DC
  Filled 2017-09-02: qty 30

## 2017-09-02 MED ORDER — BUPIVACAINE LIPOSOME 1.3 % IJ SUSP
20.0000 mL | INTRAMUSCULAR | Status: DC
  Filled 2017-09-02: qty 20

## 2017-09-02 MED ORDER — CHLORHEXIDINE GLUCONATE CLOTH 2 % EX PADS: 6.0000 | MEDICATED_PAD | Freq: Once | CUTANEOUS | Status: DC

## 2017-09-02 MED ORDER — HYDROMORPHONE HCL 1 MG/ML IJ SOLN
INTRAMUSCULAR | Status: AC
  Filled 2017-09-02: qty 1

## 2017-09-02 MED ORDER — POTASSIUM CHLORIDE CRYS ER 10 MEQ PO TBCR: 10.0000 meq | EXTENDED_RELEASE_TABLET | Freq: Two times a day (BID) | ORAL | Status: DC

## 2017-09-02 MED ORDER — VANCOMYCIN HCL 1000 MG IV SOLR
INTRAVENOUS | Status: DC | PRN
  Administered 2017-09-02: 1000 mg via TOPICAL

## 2017-09-02 MED ORDER — CYCLOBENZAPRINE HCL 10 MG PO TABS: 10.0000 mg | ORAL_TABLET | Freq: Three times a day (TID) | ORAL | Status: DC | PRN

## 2017-09-02 MED ORDER — FLUTICASONE FUROATE-VILANTEROL 200-25 MCG/INH IN AEPB: 1.0000 | INHALATION_SPRAY | Freq: Every day | RESPIRATORY_TRACT | Status: DC

## 2017-09-02 MED ORDER — DICLOFENAC SODIUM 1 % TD GEL
2.0000 g | Freq: Three times a day (TID) | TRANSDERMAL | Status: DC | PRN
  Administered 2017-09-02: 22:00:00 2 g via TOPICAL
  Filled 2017-09-02: qty 100

## 2017-09-02 MED ORDER — CYCLOBENZAPRINE HCL 10 MG PO TABS
10.0000 mg | ORAL_TABLET | Freq: Three times a day (TID) | ORAL | Status: DC | PRN
  Administered 2017-09-02: 10 mg via ORAL

## 2017-09-02 MED ORDER — SODIUM CHLORIDE 0.9% FLUSH: 3.0000 mL | Freq: Two times a day (BID) | INTRAVENOUS | Status: DC

## 2017-09-02 MED ORDER — ROCURONIUM BROMIDE 100 MG/10ML IV SOLN
INTRAVENOUS | Status: DC | PRN
  Administered 2017-09-02 (×2): 10 mg via INTRAVENOUS
  Administered 2017-09-02: 50 mg via INTRAVENOUS
  Administered 2017-09-02: 10 mg via INTRAVENOUS
  Administered 2017-09-02: 20 mg via INTRAVENOUS

## 2017-09-02 MED ORDER — SIMVASTATIN 20 MG PO TABS
40.0000 mg | ORAL_TABLET | Freq: Every day | ORAL | Status: DC
  Administered 2017-09-02: 40 mg via ORAL
  Filled 2017-09-02: qty 2

## 2017-09-02 MED ORDER — 0.9 % SODIUM CHLORIDE (POUR BTL) OPTIME
TOPICAL | Status: DC | PRN
  Administered 2017-09-02: 1000 mL

## 2017-09-02 MED ORDER — FENTANYL CITRATE (PF) 250 MCG/5ML IJ SOLN
INTRAMUSCULAR | Status: AC
  Filled 2017-09-02: qty 5

## 2017-09-02 MED ORDER — LIDOCAINE 2% (20 MG/ML) 5 ML SYRINGE
INTRAMUSCULAR | Status: AC
  Filled 2017-09-02: qty 5

## 2017-09-02 MED ORDER — ONDANSETRON HCL 4 MG/2ML IJ SOLN
INTRAMUSCULAR | Status: DC | PRN
  Administered 2017-09-02: 4 mg via INTRAVENOUS

## 2017-09-02 MED ORDER — BUPIVACAINE LIPOSOME 1.3 % IJ SUSP
20.0000 mL | INTRAMUSCULAR | Status: AC
  Administered 2017-09-02: 20 mL
  Filled 2017-09-02: qty 20

## 2017-09-02 MED ORDER — ALUM & MAG HYDROXIDE-SIMETH 200-200-20 MG/5ML PO SUSP: 30.0000 mL | Freq: Four times a day (QID) | ORAL | Status: DC | PRN

## 2017-09-02 MED ORDER — METOPROLOL TARTRATE 25 MG PO TABS
25.0000 mg | ORAL_TABLET | Freq: Two times a day (BID) | ORAL | Status: DC
  Administered 2017-09-02: 25 mg via ORAL
  Filled 2017-09-02: qty 1

## 2017-09-02 MED ORDER — SURGIFOAM 100 EX MISC
CUTANEOUS | Status: DC | PRN
  Administered 2017-09-02: 20 mL via TOPICAL

## 2017-09-02 MED ORDER — LIDOCAINE HCL (CARDIAC) 20 MG/ML IV SOLN
INTRAVENOUS | Status: DC | PRN
  Administered 2017-09-02: 30 mg via INTRAVENOUS

## 2017-09-02 MED ORDER — ACETAMINOPHEN 650 MG RE SUPP
650.0000 mg | RECTAL | Status: DC | PRN
Start: 2017-09-02 — End: 2017-09-03

## 2017-09-02 MED ORDER — FUROSEMIDE 40 MG PO TABS
40.0000 mg | ORAL_TABLET | Freq: Every day | ORAL | Status: DC
  Filled 2017-09-02: qty 1

## 2017-09-02 MED ORDER — ACETAMINOPHEN 325 MG PO TABS
650.0000 mg | ORAL_TABLET | ORAL | Status: DC | PRN
Start: 2017-09-02 — End: 2017-09-03

## 2017-09-02 MED ORDER — SUGAMMADEX SODIUM 200 MG/2ML IV SOLN
INTRAVENOUS | Status: AC
  Filled 2017-09-02: qty 2

## 2017-09-02 MED ORDER — BUPIVACAINE HCL (PF) 0.25 % IJ SOLN
INTRAMUSCULAR | Status: DC | PRN
  Administered 2017-09-02: 5 mL

## 2017-09-02 MED ORDER — ONDANSETRON HCL 4 MG PO TABS: 4.0000 mg | ORAL_TABLET | Freq: Four times a day (QID) | ORAL | Status: DC | PRN

## 2017-09-02 MED ORDER — LACTATED RINGERS IV SOLN
INTRAVENOUS | Status: DC
  Administered 2017-09-02 (×3): via INTRAVENOUS

## 2017-09-02 MED ORDER — LIDOCAINE-EPINEPHRINE 1 %-1:100000 IJ SOLN
INTRAMUSCULAR | Status: AC
  Filled 2017-09-02: qty 1

## 2017-09-02 MED ORDER — SODIUM CHLORIDE 0.9 % IR SOLN
Status: DC | PRN
  Administered 2017-09-02: 500 mL

## 2017-09-02 MED ORDER — LEVOTHYROXINE SODIUM 75 MCG PO TABS
150.0000 ug | ORAL_TABLET | Freq: Every day | ORAL | Status: DC
  Administered 2017-09-03: 150 ug via ORAL
  Filled 2017-09-02: qty 2

## 2017-09-02 MED ORDER — HYDROMORPHONE HCL 1 MG/ML IJ SOLN
0.2500 mg | INTRAMUSCULAR | Status: DC | PRN
Start: 2017-09-02 — End: 2017-09-02
  Administered 2017-09-02 (×4): 0.5 mg via INTRAVENOUS

## 2017-09-02 MED ORDER — VANCOMYCIN HCL 1000 MG IV SOLR
INTRAVENOUS | Status: AC
  Filled 2017-09-02: qty 1000

## 2017-09-02 MED ORDER — TURMERIC 500 MG PO CAPS: 500.0000 mg | ORAL_CAPSULE | Freq: Every day | ORAL | Status: DC

## 2017-09-02 MED ORDER — MIRABEGRON ER 25 MG PO TB24
25.0000 mg | ORAL_TABLET | Freq: Every day | ORAL | Status: DC
  Filled 2017-09-02: qty 1

## 2017-09-02 MED ORDER — DEXAMETHASONE SODIUM PHOSPHATE 10 MG/ML IJ SOLN
10.0000 mg | INTRAMUSCULAR | Status: AC
  Administered 2017-09-02: 10 mg via INTRAVENOUS

## 2017-09-02 MED ORDER — ONDANSETRON HCL 4 MG/2ML IJ SOLN: 4.0000 mg | Freq: Four times a day (QID) | INTRAMUSCULAR | Status: DC | PRN

## 2017-09-02 MED ORDER — MENTHOL 3 MG MT LOZG: 1.0000 | LOZENGE | OROMUCOSAL | Status: DC | PRN

## 2017-09-02 MED ORDER — SODIUM CHLORIDE 0.9 % IV SOLN: 250.0000 mL | INTRAVENOUS | Status: DC

## 2017-09-02 MED ORDER — NITROGLYCERIN 0.4 MG SL SUBL: 0.4000 mg | SUBLINGUAL_TABLET | SUBLINGUAL | Status: DC | PRN

## 2017-09-02 MED ORDER — VANCOMYCIN HCL 10 G IV SOLR
1250.0000 mg | Freq: Two times a day (BID) | INTRAVENOUS | Status: DC
  Administered 2017-09-03: 1250 mg via INTRAVENOUS
  Filled 2017-09-02 (×2): qty 1250

## 2017-09-02 MED ORDER — ASPIRIN EC 81 MG PO TBEC
81.0000 mg | DELAYED_RELEASE_TABLET | Freq: Every day | ORAL | Status: DC
  Administered 2017-09-02: 81 mg via ORAL
  Filled 2017-09-02: qty 1

## 2017-09-02 MED ORDER — GABAPENTIN (ONCE-DAILY) 600 MG PO TABS: 1200.0000 mg | ORAL_TABLET | Freq: Every day | ORAL | Status: DC

## 2017-09-02 MED ORDER — PANTOPRAZOLE SODIUM 40 MG IV SOLR
40.0000 mg | Freq: Every day | INTRAVENOUS | Status: DC
  Administered 2017-09-02: 40 mg via INTRAVENOUS
  Filled 2017-09-02: qty 40

## 2017-09-02 MED ORDER — DIPHENHYDRAMINE HCL 25 MG PO CAPS: 25.0000 mg | ORAL_CAPSULE | Freq: Four times a day (QID) | ORAL | Status: DC | PRN

## 2017-09-02 MED ORDER — OXYCODONE HCL 5 MG PO TABS
10.0000 mg | ORAL_TABLET | ORAL | Status: DC | PRN
  Administered 2017-09-02 – 2017-09-03 (×4): 10 mg via ORAL
  Filled 2017-09-02 (×3): qty 2

## 2017-09-02 MED ORDER — PHENOL 1.4 % MT LIQD: 1.0000 | OROMUCOSAL | Status: DC | PRN

## 2017-09-02 MED ORDER — MORPHINE SULFATE 15 MG PO TABS: 15.0000 mg | ORAL_TABLET | Freq: Four times a day (QID) | ORAL | Status: DC | PRN

## 2017-09-02 MED ORDER — OXYCODONE HCL 5 MG PO TABS
ORAL_TABLET | ORAL | Status: AC
  Filled 2017-09-02: qty 2

## 2017-09-02 MED ORDER — CYCLOBENZAPRINE HCL 10 MG PO TABS
ORAL_TABLET | ORAL | Status: AC
  Filled 2017-09-02: qty 1

## 2017-09-02 MED ORDER — LACTATED RINGERS IV SOLN: INTRAVENOUS | Status: DC | PRN

## 2017-09-02 MED ORDER — ONDANSETRON HCL 4 MG/2ML IJ SOLN
INTRAMUSCULAR | Status: AC
  Filled 2017-09-02: qty 2

## 2017-09-02 MED ORDER — ZOLPIDEM TARTRATE 5 MG PO TABS
5.0000 mg | ORAL_TABLET | Freq: Every day | ORAL | Status: DC
  Administered 2017-09-02: 5 mg via ORAL
  Filled 2017-09-02: qty 1

## 2017-09-02 MED ORDER — THROMBIN (RECOMBINANT) 20000 UNITS EX SOLR
CUTANEOUS | Status: AC
  Filled 2017-09-02: qty 20000

## 2017-09-02 MED ORDER — ALBUMIN HUMAN 5 % IV SOLN
INTRAVENOUS | Status: DC | PRN
  Administered 2017-09-02: 16:00:00 via INTRAVENOUS

## 2017-09-02 MED ORDER — BUPIVACAINE HCL (PF) 0.25 % IJ SOLN
INTRAMUSCULAR | Status: AC
  Filled 2017-09-02: qty 30

## 2017-09-02 MED ORDER — LIDOCAINE-EPINEPHRINE 1 %-1:100000 IJ SOLN
INTRAMUSCULAR | Status: DC | PRN
  Administered 2017-09-02: 5 mL

## 2017-09-02 MED ORDER — PHENYLEPHRINE HCL 10 MG/ML IJ SOLN
INTRAVENOUS | Status: DC | PRN
  Administered 2017-09-02: 50 ug/min via INTRAVENOUS
  Administered 2017-09-02 (×2): 25 ug/min via INTRAVENOUS

## 2017-09-02 MED ORDER — GABAPENTIN 600 MG PO TABS
600.0000 mg | ORAL_TABLET | Freq: Once | ORAL | Status: AC
  Administered 2017-09-02: 600 mg via ORAL
  Filled 2017-09-02: qty 1

## 2017-09-02 MED ORDER — FLUTICASONE FUROATE-VILANTEROL 200-25 MCG/INH IN AEPB
1.0000 | INHALATION_SPRAY | Freq: Every day | RESPIRATORY_TRACT | Status: DC
  Filled 2017-09-02: qty 28

## 2017-09-02 MED ORDER — PROPOFOL 10 MG/ML IV BOLUS
INTRAVENOUS | Status: AC
  Filled 2017-09-02: qty 20

## 2017-09-02 MED ORDER — SUGAMMADEX SODIUM 200 MG/2ML IV SOLN
INTRAVENOUS | Status: DC | PRN
  Administered 2017-09-02: 191 mg via INTRAVENOUS

## 2017-09-02 MED ORDER — VITAMIN C 500 MG PO TABS
1000.0000 mg | ORAL_TABLET | Freq: Every day | ORAL | Status: DC
  Filled 2017-09-02: qty 2

## 2017-09-02 MED ORDER — VANCOMYCIN HCL IN DEXTROSE 1-5 GM/200ML-% IV SOLN
INTRAVENOUS | Status: AC
  Filled 2017-09-02: qty 200

## 2017-09-02 MED ORDER — HYDROMORPHONE HCL 1 MG/ML IJ SOLN
0.5000 mg | INTRAMUSCULAR | Status: DC | PRN
  Administered 2017-09-02: 0.5 mg via INTRAVENOUS
  Filled 2017-09-02: qty 0.5

## 2017-09-02 MED ORDER — DEXAMETHASONE SODIUM PHOSPHATE 10 MG/ML IJ SOLN
INTRAMUSCULAR | Status: AC
  Filled 2017-09-02: qty 1

## 2017-09-02 MED ORDER — FENTANYL CITRATE (PF) 100 MCG/2ML IJ SOLN
INTRAMUSCULAR | Status: DC | PRN
  Administered 2017-09-02 (×2): 50 ug via INTRAVENOUS
  Administered 2017-09-02: 150 ug via INTRAVENOUS
  Administered 2017-09-02: 100 ug via INTRAVENOUS
  Administered 2017-09-02 (×3): 50 ug via INTRAVENOUS

## 2017-09-02 MED ORDER — ROCURONIUM BROMIDE 10 MG/ML (PF) SYRINGE
PREFILLED_SYRINGE | INTRAVENOUS | Status: AC
  Filled 2017-09-02: qty 10

## 2017-09-02 MED ORDER — VANCOMYCIN HCL IN DEXTROSE 1-5 GM/200ML-% IV SOLN
1000.0000 mg | INTRAVENOUS | Status: AC
  Administered 2017-09-02: 1000 mg via INTRAVENOUS

## 2017-09-02 MED ORDER — ALBUTEROL SULFATE (2.5 MG/3ML) 0.083% IN NEBU: 2.5000 mg | INHALATION_SOLUTION | RESPIRATORY_TRACT | Status: DC | PRN

## 2017-09-02 MED ORDER — CLOBETASOL PROPIONATE 0.05 % EX SOLN: 1.0000 "application " | Freq: Two times a day (BID) | CUTANEOUS | Status: DC | PRN

## 2017-09-02 SURGICAL SUPPLY — 88 items
ADH SKN CLS APL DERMABOND .7 (GAUZE/BANDAGES/DRESSINGS) ×2
APL SKNCLS STERI-STRIP NONHPOA (GAUZE/BANDAGES/DRESSINGS) ×1
BAG DECANTER FOR FLEXI CONT (MISCELLANEOUS) ×3 IMPLANT
BENZOIN TINCTURE PRP APPL 2/3 (GAUZE/BANDAGES/DRESSINGS) ×3 IMPLANT
BIT DRILL 5.0/4.0 (BIT) IMPLANT
BLADE CLIPPER SURG (BLADE) IMPLANT
BLADE SURG 11 STRL SS (BLADE) ×3 IMPLANT
BONE VIVIGEN FORMABLE 5.4CC (Bone Implant) ×3 IMPLANT
BUR CUTTER 7.0 ROUND (BURR) ×3 IMPLANT
BUR MATCHSTICK NEURO 3.0 LAGG (BURR) ×3 IMPLANT
CANISTER SUCT 3000ML PPV (MISCELLANEOUS) ×3 IMPLANT
CAP LOCKING (Cap) ×8 IMPLANT
CAP LOCKING 5.5 CREO (Cap) IMPLANT
CARTRIDGE OIL MAESTRO DRILL (MISCELLANEOUS) ×1 IMPLANT
CLOSURE WOUND 1/2 X4 (GAUZE/BANDAGES/DRESSINGS) ×3
CONT SPEC 4OZ CLIKSEAL STRL BL (MISCELLANEOUS) ×3 IMPLANT
COVER BACK TABLE 60X90IN (DRAPES) ×3 IMPLANT
DECANTER SPIKE VIAL GLASS SM (MISCELLANEOUS) ×3 IMPLANT
DERMABOND ADVANCED (GAUZE/BANDAGES/DRESSINGS) ×4
DERMABOND ADVANCED .7 DNX12 (GAUZE/BANDAGES/DRESSINGS) ×1 IMPLANT
DIFFUSER DRILL AIR PNEUMATIC (MISCELLANEOUS) ×3 IMPLANT
DRAPE C-ARM 42X72 X-RAY (DRAPES) ×6 IMPLANT
DRAPE C-ARMOR (DRAPES) ×2 IMPLANT
DRAPE HALF SHEET 40X57 (DRAPES) IMPLANT
DRAPE LAPAROTOMY 100X72X124 (DRAPES) ×3 IMPLANT
DRAPE POUCH INSTRU U-SHP 10X18 (DRAPES) ×3 IMPLANT
DRAPE SURG 17X23 STRL (DRAPES) ×3 IMPLANT
DRILL 5.0/4.0 (BIT) ×3
DRSG OPSITE 4X5.5 SM (GAUZE/BANDAGES/DRESSINGS) ×4 IMPLANT
DRSG OPSITE POSTOP 4X10 (GAUZE/BANDAGES/DRESSINGS) ×2 IMPLANT
DURAPREP 26ML APPLICATOR (WOUND CARE) ×3 IMPLANT
ELECT REM PT RETURN 9FT ADLT (ELECTROSURGICAL) ×3
ELECTRODE REM PT RTRN 9FT ADLT (ELECTROSURGICAL) ×1 IMPLANT
EVACUATOR 1/8 PVC DRAIN (DRAIN) ×2 IMPLANT
EVACUATOR 3/16  PVC DRAIN (DRAIN) ×2
EVACUATOR 3/16 PVC DRAIN (DRAIN) IMPLANT
GAUZE SPONGE 4X4 12PLY STRL (GAUZE/BANDAGES/DRESSINGS) ×3 IMPLANT
GAUZE SPONGE 4X4 12PLY STRL LF (GAUZE/BANDAGES/DRESSINGS) ×2 IMPLANT
GAUZE SPONGE 4X4 16PLY XRAY LF (GAUZE/BANDAGES/DRESSINGS) ×6 IMPLANT
GLOVE BIO SURGEON STRL SZ7 (GLOVE) IMPLANT
GLOVE BIO SURGEON STRL SZ8 (GLOVE) ×2 IMPLANT
GLOVE BIOGEL PI IND STRL 7.0 (GLOVE) IMPLANT
GLOVE BIOGEL PI INDICATOR 7.0 (GLOVE)
GLOVE ECLIPSE 7.5 STRL STRAW (GLOVE) IMPLANT
GLOVE EXAM NITRILE LRG STRL (GLOVE) IMPLANT
GLOVE EXAM NITRILE XL STR (GLOVE) IMPLANT
GLOVE EXAM NITRILE XS STR PU (GLOVE) IMPLANT
GLOVE INDICATOR 8.5 STRL (GLOVE) ×2 IMPLANT
GLOVE SURG SS PI 6.5 STRL IVOR (GLOVE) ×6 IMPLANT
GLOVE SURG SS PI 7.0 STRL IVOR (GLOVE) ×4 IMPLANT
GLOVE SURG SS PI 8.0 STRL IVOR (GLOVE) ×4 IMPLANT
GOWN STRL REUS W/ TWL LRG LVL3 (GOWN DISPOSABLE) IMPLANT
GOWN STRL REUS W/ TWL XL LVL3 (GOWN DISPOSABLE) ×2 IMPLANT
GOWN STRL REUS W/TWL 2XL LVL3 (GOWN DISPOSABLE) IMPLANT
GOWN STRL REUS W/TWL LRG LVL3 (GOWN DISPOSABLE) ×6
GOWN STRL REUS W/TWL XL LVL3 (GOWN DISPOSABLE) ×6
GRAFT BNE MATRIX VG FRMBL MD 5 (Bone Implant) IMPLANT
HEMOSTAT POWDER KIT SURGIFOAM (HEMOSTASIS) IMPLANT
IMPL SPINE RISE 8-15-10-26M (Neuro Prosthesis/Implant) IMPLANT
IMPLANT SPINE RISE 8-15-10-26M (Neuro Prosthesis/Implant) ×6 IMPLANT
KIT BASIN OR (CUSTOM PROCEDURE TRAY) ×3 IMPLANT
KIT ROOM TURNOVER OR (KITS) ×3 IMPLANT
MILL MEDIUM DISP (BLADE) ×1 IMPLANT
NDL HYPO 21X1.5 SAFETY (NEEDLE) IMPLANT
NDL HYPO 25X1 1.5 SAFETY (NEEDLE) ×1 IMPLANT
NEEDLE HYPO 21X1.5 SAFETY (NEEDLE) ×3 IMPLANT
NEEDLE HYPO 25X1 1.5 SAFETY (NEEDLE) ×3 IMPLANT
NS IRRIG 1000ML POUR BTL (IV SOLUTION) ×5 IMPLANT
OIL CARTRIDGE MAESTRO DRILL (MISCELLANEOUS) ×3
PACK LAMINECTOMY NEURO (CUSTOM PROCEDURE TRAY) ×3 IMPLANT
PAD ARMBOARD 7.5X6 YLW CONV (MISCELLANEOUS) ×9 IMPLANT
ROD STRAIGHT 5.5X35MM (Rod) ×4 IMPLANT
SHAFT CREO 30MM (Neuro Prosthesis/Implant) ×8 IMPLANT
SPONGE LAP 4X18 X RAY DECT (DISPOSABLE) IMPLANT
SPONGE SURGIFOAM ABS GEL 100 (HEMOSTASIS) ×3 IMPLANT
STRIP CLOSURE SKIN 1/2X4 (GAUZE/BANDAGES/DRESSINGS) ×5 IMPLANT
SUT VIC AB 0 CT1 18XCR BRD8 (SUTURE) ×2 IMPLANT
SUT VIC AB 0 CT1 8-18 (SUTURE) ×6
SUT VIC AB 2-0 CT1 18 (SUTURE) ×3 IMPLANT
SUT VIC AB 4-0 PS2 18 (SUTURE) ×2 IMPLANT
SUT VIC AB 4-0 PS2 27 (SUTURE) ×3 IMPLANT
SYR CONTROL 10ML LL (SYRINGE) ×2 IMPLANT
TOWEL GREEN STERILE (TOWEL DISPOSABLE) ×3 IMPLANT
TOWEL GREEN STERILE FF (TOWEL DISPOSABLE) ×3 IMPLANT
TRAY FOLEY BAG SILVER LF 16FR (SET/KITS/TRAYS/PACK) ×2 IMPLANT
TRAY FOLEY W/METER SILVER 16FR (SET/KITS/TRAYS/PACK) ×1 IMPLANT
TULIP CREP AMP 5.5MM (Orthopedic Implant) ×8 IMPLANT
WATER STERILE IRR 1000ML POUR (IV SOLUTION) ×3 IMPLANT

## 2017-09-02 NOTE — Plan of Care (Signed)
  Progressing Safety: Ability to remain free from injury will improve 09/02/2017 2233 - Progressing by Charlena Cross, RN Activity: Ability to avoid complications of mobility impairment will improve 09/02/2017 2233 - Progressing by Charlena Cross, RN Ability to tolerate increased activity will improve 09/02/2017 2233 - Progressing by Charlena Cross, RN Will remain free from falls 09/02/2017 2233 - Progressing by Margot Chimes D, RN Bowel/Gastric: Gastrointestinal status for postoperative course will improve 09/02/2017 2233 - Progressing by Charlena Cross, RN Education: Ability to verbalize activity precautions or restrictions will improve 09/02/2017 2233 - Progressing by Charlena Cross, RN Knowledge of the prescribed therapeutic regimen will improve 09/02/2017 2233 - Progressing by Charlena Cross, RN Understanding of discharge needs will improve 09/02/2017 2233 - Progressing by Charlena Cross, RN Physical Regulation: Ability to maintain clinical measurements within normal limits will improve 09/02/2017 2233 - Progressing by Charlena Cross, RN Postoperative complications will be avoided or minimized 09/02/2017 2233 - Progressing by Charlena Cross, RN Diagnostic test results will improve 09/02/2017 2233 - Progressing by Charlena Cross, RN Pain Management: Pain level will decrease 09/02/2017 2233 - Progressing by Charlena Cross, RN Skin Integrity: Signs of wound healing will improve 09/02/2017 2233 - Progressing by Margot Chimes D, RN Health Behavior/Discharge Planning: Identification of resources available to assist in meeting health care needs will improve 09/02/2017 2233 - Progressing by Charlena Cross, RN Bladder/Genitourinary: Urinary functional status for postoperative course will improve 09/02/2017 2233 - Progressing by Charlena Cross, RN

## 2017-09-02 NOTE — Anesthesia Procedure Notes (Signed)
Procedure Name: Intubation Date/Time: 09/02/2017 1:54 PM Performed by: Eligha Bridegroom, CRNA Pre-anesthesia Checklist: Patient identified, Emergency Drugs available, Suction available, Patient being monitored and Timeout performed Patient Re-evaluated:Patient Re-evaluated prior to induction Oxygen Delivery Method: Circle system utilized Preoxygenation: Pre-oxygenation with 100% oxygen Induction Type: IV induction Ventilation: Mask ventilation without difficulty Laryngoscope Size: Mac and 3 Grade View: Grade I Tube type: Oral Tube size: 7.0 mm Number of attempts: 1 Airway Equipment and Method: Stylet Placement Confirmation: ETT inserted through vocal cords under direct vision,  positive ETCO2 and breath sounds checked- equal and bilateral Secured at: 21 cm Tube secured with: Tape Dental Injury: Teeth and Oropharynx as per pre-operative assessment

## 2017-09-02 NOTE — H&P (Signed)
Selena [name] is an 67 y.o. female.   Chief Complaint: Back and bilateral leg pain HPI: 67 year old female with progressive worsening back and bilateral leg pain radiating in both an L2-L3 and L5 nerve root pattern. Workup revealed spondylolisthesis that movement on flexion extension films at L2-3 and severe stenosis at L4-5. Due to patient's progression of clinical syndrome imaging findings of a conservative treatment I recommended decompressive laminectomy and fusion at L2-3 and decompressive laminectomy L4-5. I've extensively gone over the risks and benefits of those operations with her as well as perioperative course expectations of outcome and alternatives of surgery and she understands and agrees to proceed forward.  Past Medical History:  Diagnosis Date  . Abnormal findings on imaging of biliary tract 1997-2000   CBD 67mm, Dr Albin Felling biliary/pancreatic manometry, EUS Dr. Ardis Hughs 09/18/07 dilated but otherwise normal extrahepatic duct, no masses  . Arthritis   . Bursitis   . Bursitis    shoulder and both hips  . CAD (coronary artery disease)   . Cervical ca ([place]) 1984  . Chronic abdominal pain    functional, seen @[facility] , extensive WU here benign   . 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 Complication of anesthesia    years ago woke up during surgery  . Constipation   . COPD (chronic obstructive pulmonary disease) ([place])   . Depression    w/ menopause  . Dysrhythmia    AFib  . GERD (gastroesophageal reflux disease)   . H/O hiatal hernia   . Headache    hx of migraines in past  . Heart murmur   . Hemorrhoids   . HTN (hypertension)   . Hyperlipemia   . Hypothyroidism   . MI (myocardial infarction) ([place]) 2000  . Pelvic pain in female 05/24/2015  . 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 PUD (peptic ulcer disease) 1997/1998   negative h pylori  . S/P colonoscopy 09/10/07   Dr. Donald Pore sec to tortuous colon, followed by [facility] & flex sig normal  .  Shortness of breath    with exertion  . Skin cancer    nose  . Stroke ([place])    1983  . Tachycardia    being evaluated recently by Dr. Rollene Fare  . TIA (transient ischemic attack) 1983    Past Surgical History:  Procedure Laterality Date  . ABDOMINAL HYSTERECTOMY    . APPENDECTOMY    . arthroscopic knee    . CATARACT EXTRACTION W/PHACO Right 09/02/2014   Procedure: CATARACT EXTRACTION PHACO AND INTRAOCULAR LENS PLACEMENT RIGHT EYE;  Surgeon: Tonny Branch, MD;  Location: [facility];  Service: Ophthalmology;  Laterality: Right;  CDE:8.96  . CATARACT EXTRACTION W/PHACO Left 09/13/2014   Procedure: CATARACT EXTRACTION PHACO AND INTRAOCULAR LENS PLACEMENT LEFT EYE;  Surgeon: Tonny Branch, MD;  Location: [facility];  Service: Ophthalmology;  Laterality: Left;  CDE 6.20  . CHOLECYSTECTOMY    . INGUINAL HERNIA REPAIR     left  . JOINT REPLACEMENT Bilateral    knees  . PARTIAL HYSTERECTOMY    . right arm     nerve  . TONSILLECTOMY    . TONSILLECTOMY    . TOTAL KNEE ARTHROPLASTY  06/20/2012   Procedure: TOTAL KNEE ARTHROPLASTY;  Surgeon: Yvette Rack., MD;  Location: [place];  Service: Orthopedics;  Laterality: Right;  . TOTAL KNEE ARTHROPLASTY Left 04/02/2014   Procedure: TOTAL KNEE ARTHROPLASTY;  Surgeon: Yvette Rack., MD;  Location: [place];  Service:  Orthopedics;  Laterality: Left;    Family History  Problem Relation Age of Onset  . Ulcers Brother   . Heart attack Brother   . Ulcers Sister   . Thyroid disease Daughter   . Hypertension Daughter   . Other Daughter        nervous  . Hypertension Sister   . Heart disease Sister   . Thyroid disease Sister   . Hypertension Sister   . Heart disease Sister   . Thyroid disease Sister   . Heart attack Brother   . Cancer Brother        prostate   Social History:  reports that she quit smoking about 13 years ago. Her smoking use included cigarettes. She started smoking about 36 years ago. She has a 22.00 pack-year smoking history. she has never  used smokeless tobacco. She reports that she does not drink alcohol or use drugs.  Allergies:  Allergies  Allergen Reactions  . Nubain [Nalbuphine Hcl] Shortness Of Breath and Other (See Comments)    Difficulty breathing ??  DOSE RELATED  ??  . Penicillins Itching, Rash and Other (See Comments)    PATIENT HAS HAD A PCN REACTION WITH IMMEDIATE RASH, FACIAL/TONGUE/THROAT SWELLING, SOB, OR LIGHTHEADEDNESS WITH HYPOTENSION:  #  #  #  YES  #  #  #   Has patient had a PCN reaction causing severe rash involving mucus membranes or skin necrosis: No Has patient had a PCN reaction that required hospitalization No Has patient had a PCN reaction occurring within the last 10 years: No If all of the above answers are "NO", then may proceed with Cephalosporin use.   . Iohexol Swelling     Desc: Pt states she had Cardia Cath around 2000/ experienced SOB and swelling of upper body during cath procedure/ tsf   . Latex Rash    Medications Prior to Admission  Medication Sig Dispense Refill  . albuterol (PROVENTIL HFA;VENTOLIN HFA) 108 (90 Base) MCG/ACT inhaler Inhale 2 puffs into the lungs every 4 (four) hours as needed for wheezing or shortness of breath. 1 Inhaler 0  . Ascorbic Acid (VITAMIN C) 1000 MG tablet Take 1,000 mg by mouth daily.    Marland Kitchen aspirin EC 81 MG tablet Take 1 tablet (81 mg total) by mouth daily. 90 tablet 3  . clobetasol (TEMOVATE) 0.05 % external solution Apply 1 application topically 2 (two) times daily as needed (for psoriasis on on scalp).   3  . cyclobenzaprine (FLEXERIL) 10 MG tablet Take 10 mg by mouth 3 (three) times daily as needed for muscle spasms.     Marland Kitchen desonide (DESOWEN) 0.05 % cream Apply 1 application topically 2 (two) times daily as needed (for psoriasis on skin).   3  . diclofenac sodium (VOLTAREN) 1 % GEL Apply 2 g topically daily as needed (for knee pain/arthritis).     . Fluticasone-Salmeterol (ADVAIR) 250-50 MCG/DOSE AEPB Inhale 1 puff into the lungs 2 (two) times  daily.    . furosemide (LASIX) 40 MG tablet Take 40 mg by mouth daily.     Marland Kitchen GRALISE 600 MG TABS Take 1,200 mg by mouth daily after supper.     Marland Kitchen KLOR-CON M10 10 MEQ tablet Take 10 mEq by mouth 2 (two) times daily.    Marland Kitchen levothyroxine (SYNTHROID, LEVOTHROID) 150 MCG tablet Take 150 mcg by mouth daily.     Marland Kitchen losartan (COZAAR) 100 MG tablet Take 100 mg by mouth daily.    . metoprolol tartrate (  LOPRESSOR) 25 MG tablet Take 25 mg by mouth 2 (two) times daily.     . mirabegron ER (MYRBETRIQ) 25 MG TB24 tablet Take 25 mg by mouth daily.    Marland Kitchen morphine (MSIR) 15 MG tablet Take 15 mg by mouth every 6 (six) hours as needed (for pain.).     Marland Kitchen MOVANTIK 25 MG TABS tablet Take 25 mg by mouth daily.     . pantoprazole (PROTONIX) 40 MG tablet Take 1 tablet (40 mg total) by mouth 2 (two) times daily before a meal. 180 tablet 3  . PREMARIN vaginal cream Place 0.5 g vaginally 2 (two) times a week. Sunday & Wednesday  6  . simvastatin (ZOCOR) 40 MG tablet Take 40 mg by mouth daily.     Marland Kitchen triamcinolone cream (KENALOG) 0.1 % Apply 1 application topically 2 (two) times daily as needed (for skin irritation). Apply to leg     . Turmeric 500 MG CAPS Take 500 mg by mouth daily.    Marland Kitchen zolpidem (AMBIEN) 5 MG tablet Take 5 mg by mouth at bedtime.    . diphenhydrAMINE (BENADRYL) 25 mg capsule Take 25 mg by mouth every 6 (six) hours as needed (for seasonal allergies.).     Marland Kitchen nitroGLYCERIN (NITROSTAT) 0.4 MG SL tablet Place 0.4 mg under the tongue every 5 (five) minutes as needed for chest pain.       No results found for this or any previous visit (from the past 48 hour(s)). No results found.  Review of Systems  Musculoskeletal: Positive for back pain.  Neurological: Positive for tingling and sensory change.    Blood pressure (!) 150/91, pulse 82, temperature 98.3 F (36.8 C), temperature source Oral, resp. rate 20, height 5\' 4"  (1.626 m), weight 95.5 kg (210 lb 8 oz), SpO2 95 %. Physical Exam  Constitutional: She is  oriented to person, place, and time. She appears well-developed.  HENT:  Head: Normocephalic.  Eyes: Pupils are equal, round, and reactive to light.  Neck: Normal range of motion.  GI: Soft.  Musculoskeletal: Normal range of motion.  Neurological: She is alert and oriented to person, place, and time. She has normal strength. GCS eye subscore is 4. GCS verbal subscore is 5. GCS motor subscore is 6.  Strength out of 5 iliopsoas, quads, hip she's, gastric, into tibialis, EHL.     Assessment/Plan 67 year female presents for decompression in by fusion L2-3 and decompressive laminotomy L4-5.  Shaunita Seney P, MD 09/02/2017, 1:33 PM

## 2017-09-02 NOTE — Progress Notes (Signed)
Pharmacy Antibiotic Note  Selena [name] is a 67 y.o. female s/p back surgery with drain placement  Plan: vanc 1250 q12h F/u lot once drain removed  Height: 5\' 4"  (162.6 cm) Weight: 210 lb 8 oz (95.5 kg) IBW/kg (Calculated) : 54.7  Temp (24hrs), Avg:98 F (36.7 C), Min:97.7 F (36.5 C), Max:98.3 F (36.8 C)  Recent Labs  Lab 08/28/17 1447  WBC 9.8  CREATININE 0.94    Estimated Creatinine Clearance: 66 mL/min (by C-G formula based on SCr of 0.94 mg/dL).    Allergies  Allergen Reactions  . Nubain [Nalbuphine Hcl] Shortness Of Breath and Other (See Comments)    Difficulty breathing ??  DOSE RELATED  ??  . Penicillins Itching, Rash and Other (See Comments)    PATIENT HAS HAD A PCN REACTION WITH IMMEDIATE RASH, FACIAL/TONGUE/THROAT SWELLING, SOB, OR LIGHTHEADEDNESS WITH HYPOTENSION:  #  #  #  YES  #  #  #   Has patient had a PCN reaction causing severe rash involving mucus membranes or skin necrosis: No Has patient had a PCN reaction that required hospitalization No Has patient had a PCN reaction occurring within the last 10 years: No If all of the above answers are "NO", then may proceed with Cephalosporin use.   . Iohexol Swelling     Desc: Pt states she had Cardia Cath around 2000/ experienced SOB and swelling of upper body during cath procedure/ tsf   . Latex Rash   Levester Fresh, PharmD, BCPS, BCCCP Clinical Pharmacist Clinical phone for 09/02/2017 from 1430 [phone]: [phone] If after 2300, please call main pharmacy at: [phone] 09/02/2017 6:45 PM

## 2017-09-02 NOTE — Anesthesia Postprocedure Evaluation (Signed)
Anesthesia Post Note  Patient: JIRAH RIDER  Procedure(s) Performed: POSTERIOR LUMBAR INTERBODY FUSION LUMBAR TWO-THREE;  ;Laminectomy and Foraminotomy Lumbar four-five (N/A Back)     Patient location during evaluation: PACU Anesthesia Type: General Level of consciousness: awake and alert, awake and oriented Pain management: pain level controlled Vital Signs Assessment: post-procedure vital signs reviewed and stable Respiratory status: spontaneous breathing, nonlabored ventilation, respiratory function stable and patient connected to nasal cannula oxygen Cardiovascular status: blood pressure returned to baseline and stable Postop Assessment: no apparent nausea or vomiting Anesthetic complications: no    Last Vitals:  Vitals:   09/02/17 1839 09/02/17 2000  BP: (!) 119/92 130/70  Pulse: 83 82  Resp: 18 20  Temp: 36.5 C 36.8 C  SpO2: 94% 94%    Last Pain:  Vitals:   09/02/17 2010  TempSrc:   PainSc: 10-Worst pain ever                 Catalina Gravel

## 2017-09-02 NOTE — Op Note (Signed)
Diagnosis: Grade 1 spondylolisthesis with instability L2-3 and lumbar spinal stenosis L2-3 bilateral L2-L3 radiculopathies. Lumbar spinal stenosis L4-5 with bilateral L5 radicular this  Postoperative diagnosis: Same  Procedure: #1 decompressive lumbar laminectomy L2-3 in excess and requiring more work to would be needed with a standard interbody fusion with complete medial facetectomies radical foraminotomies of the L2 and L3 nerve roots.  #2 posterior lumbar interbody fusion L2-3 utilizing the globus rise expandable cage system packed with locally harvested autograft mixed with vivigen  #3 cortical screw fixation L2-3 utilizing the globus Creole modular cortical screw set  #4 posterior lateral arthrodesis L2-3 utilizing locally harvested autograft mixed with vivigen  #5 through a separate skin incision decompressive lumbar laminectomy bilateral L4-5 with foraminotomies of the L4 and L5 nerve roots  Surgeon: Dominica Severin Gottfried Standish  Asst.: Dayton Bailiff  Anesthesia: Gen.  EBL: Minimal  History of present illness: 67 year old female is a long-standing back and bilateral leg pain workup revealed a dynamic listhesis at L2-3 with severe spinal stenosis as well as severe stenosis at L4-5. Due to patient's failure conservative treatment imaging findings and progressive clinical syndrome or recommended decompressive laminectomies interbody fusion L2-3 and just a decompression at L4-5. I extensively reviewed the risks and benefits of the operation with the patient as well as perioperative course expectations of outcome alternatives of surgery and she understood and agreed to proceed word.  Operative procedure: Patient brought into the or was induced on general anesthesia positioned prone the Wilson frame her back was prepped and draped in routine sterile fashion 2 incisions were drawn out confirmed by x-ray overlying the L2-3 and L4-5 disc spaces. So first the L2-3 disc and incision was infiltrated and incised  subperiosteal dissections care lamina of L2-3 interoperative x-ray confirmed of case. Levels of the spinous process was removed central decompression was performed complete medial facetectomies were performed radical foraminotomies of the L2 and L3 nerve roots were carried out there was extensive amount of facet arthropathy and severe stenosis inferior the disc space on the medial aspect of facet joint this is all unroofed decompressing the L3 nerve root and then marching L out laterally the L2 nerve root was also decompressed. Aggressive undergoing the super tickling facet a ladder axis the lateral aspect of the disc space. Disc space was incised bilaterally sequentially distracted and opened up which also help reduce the deformity. With a 9 distractor in place is selected 8-15 expandable 10 lordotic cages. These cages were packed with locally harvested autograft mixed withvivigen and after adequate discectomy and plate progression was achieved were inserted. The local autograft mix was packed centrally between the 2 cages both cages were expanded. This significantly opened up the disc space reduce the deformity and decompress the L2 and L3 nerve roots. Then attention second cortical screw placement placement. Under fluoroscopy pilot holes were drilled, the holes within probed tapped with a 45 Probed again and 60/50 by 30 mm screws inserted bilaterally at L2 and L3. All screws except for just of postop fluoroscopy confirmed good position all the implants. Who was a go was irrigated meticulous he states was maintained aggressive decortication was carried on the lateral last the facet joints the remainder the local autograft was packed posterior laterally. I then selected 2 small rods anchored all the knots in place inspected all the foramina to confirm decompression patency and no migration of graft material. Laid Gelfoam over the dura inject X prone the fascia spica vancomycin powder in the wound and closed the  wound  in layers after placement of a medium large Hemovac drain with interrupted Vicryl. Skin was closed running 4 septic or Dermabond benzo and Steri-Strips. Then through separate skin incision I opened up the area over L4-5. Subperiosteal dissections care lamina of L4-5 interoperative x-ray confirmed location proper level I then removed the spinous processes at L4 can 4 and performed a central decompression there was marked hourglass pressure thecal sac and causing severe stenosis of the proximal 5 root as well as the undersurface of the 4 root. This is all under bitten around the medial gutter decompress the central canal and both L4 and L5 nerve roots bilaterally. After adequate decompression achieved tear Gelfoam was laid top of the dura a medium VAC drain was placed and the wounds closed in layers with after Vicryl running 4 subcuticular Dermabond benzo and Steri-Strips and a sterile dressing was applied patient recovered in stable condition. At the end of case all needle counts sponge counts were correct.

## 2017-09-02 NOTE — Anesthesia Preprocedure Evaluation (Addendum)
Anesthesia Evaluation  Patient identified by MRN, date of birth, ID band Patient awake    Reviewed: Allergy & Precautions, H&P , NPO status , Patient's Chart, lab work & pertinent test results, reviewed documented beta blocker date and time   Airway Mallampati: III  TM Distance: >3 FB Neck ROM: Full    Dental no notable dental hx. (+) Teeth Intact, Dental Advisory Given   Pulmonary COPD,  COPD inhaler, former smoker,    Pulmonary exam normal breath sounds clear to auscultation       Cardiovascular hypertension, Pt. on medications and Pt. on home beta blockers + CAD and + Past MI  + dysrhythmias  Rhythm:Regular Rate:Normal     Neuro/Psych  Headaches, Depression TIA   GI/Hepatic Neg liver ROS, hiatal hernia, GERD  Medicated and Controlled,  Endo/Other  Hypothyroidism Morbid obesity  Renal/GU negative Renal ROS  negative genitourinary   Musculoskeletal  (+) Arthritis , Osteoarthritis,    Abdominal   Peds  Hematology negative hematology ROS (+)   Anesthesia Other Findings   Reproductive/Obstetrics negative OB ROS                            Anesthesia Physical Anesthesia Plan  ASA: III  Anesthesia Plan: General   Post-op Pain Management:    Induction: Intravenous  PONV Risk Score and Plan: 4 or greater and Ondansetron, Dexamethasone and Midazolam  Airway Management Planned: Oral ETT  Additional Equipment:   Intra-op Plan:   Post-operative Plan: Extubation in OR  Informed Consent: I have reviewed the patients History and Physical, chart, labs and discussed the procedure including the risks, benefits and alternatives for the proposed anesthesia with the patient or authorized representative who has indicated his/her understanding and acceptance.   Dental advisory given  Plan Discussed with: CRNA  Anesthesia Plan Comments:         Anesthesia Quick Evaluation

## 2017-09-02 NOTE — Transfer of Care (Signed)
Immediate Anesthesia Transfer of Care Note  Patient: Selena [name]  Procedure(s) Performed: POSTERIOR LUMBAR INTERBODY FUSION LUMBAR TWO-THREE;  ;Laminectomy and Foraminotomy Lumbar four-five (N/A Back)  Patient Location: PACU  Anesthesia Type:General  Level of Consciousness: awake, alert  and oriented  Airway & Oxygen Therapy: Patient Spontanous Breathing and Patient connected to nasal cannula oxygen  Post-op Assessment: Report given to RN and Post -op Vital signs reviewed and stable  Post vital signs: Reviewed and stable  Last Vitals:  Vitals:   09/02/17 1154 09/02/17 1743  BP: (!) 150/91 (!) 129/109  Pulse:  86  Resp:  16  Temp:  36.7 C  SpO2:  96%    Last Pain:  Vitals:   09/02/17 1743  TempSrc:   PainSc: 10-Worst pain ever         Complications: No apparent anesthesia complications

## 2017-09-03 MED ORDER — CYCLOBENZAPRINE HCL 10 MG PO TABS: 10.0000 mg | ORAL_TABLET | Freq: Three times a day (TID) | ORAL | 0 refills | Status: DC | PRN

## 2017-09-03 NOTE — Evaluation (Signed)
Occupational Therapy Evaluation Patient Details Name: Selena [name] MRN: [id number] DOB: 1951-04-14 Today's Date: 09/03/2017    History of Present Illness 67 year old female who was medically yesterday the hospital underwent an L2-3 posterior lumbar interbody fusion L4-5 decompressive laminectomy.    Clinical Impression   PTA, pt was living with her husband and was independent. Currently, pt performing ADLs and functional mobility at supervision level. Provided education on back precautions, brace management, grooming, LB ADLs, toileting, and tub transfer; pt demonstrated understanding. Answered all pt questions. Recommend dc home once medically stable per physician. All acute OT needs met and will sign off. Thank you.    Follow Up Recommendations  No OT follow up;Supervision/Assistance - 24 hour    Equipment Recommendations  None recommended by OT    Recommendations for Other Services PT consult     Precautions / Restrictions Precautions Precautions: Back Precaution Booklet Issued: Yes (comment) Precaution Comments: Reviewed all back precautions and adhereance during ADLs. Pt able to recall 2/3 precautions with Min VCs for no lifting. Required Braces or Orthoses: Spinal Brace Spinal Brace: Thoracolumbosacral orthotic;Applied in sitting position Restrictions Weight Bearing Restrictions: No      Mobility Bed Mobility Overal bed mobility: Needs Assistance Bed Mobility: Rolling;Sidelying to Sit Rolling: Supervision Sidelying to sit: Supervision       General bed mobility comments: supervision for safety and VCs for log roll technique  Transfers Overall transfer level: Needs assistance Equipment used: None Transfers: Sit to/from Stand Sit to Stand: Supervision         General transfer comment: supervision for safety. Educated pt on safe sit<>Stand from toilet without grab bars    Balance Overall balance assessment: Needs assistance Sitting-balance support: No  upper extremity supported;Feet supported Sitting balance-Leahy Scale: Good     Standing balance support: No upper extremity supported;During functional activity Standing balance-Leahy Scale: Fair                             ADL either performed or assessed with clinical judgement   ADL Overall ADL's : Needs assistance/impaired                                       General ADL Comments: Pt performing ADLs and functional mobility at supervision level. Provided education on LB ADLs, back brace management, grooming, bed mobility, toileting, and tub transfer. Pt demonstrating understanding. PRovided handout and husband present throughout     Vision         Perception     Praxis      Pertinent Vitals/Pain Pain Assessment: Faces Faces Pain Scale: Hurts little more Pain Location: Back Pain Descriptors / Indicators: Constant;Discomfort;Grimacing Pain Intervention(s): Monitored during session;Limited activity within patient's tolerance;Repositioned     Hand Dominance Right   Extremity/Trunk Assessment Upper Extremity Assessment Upper Extremity Assessment: Overall WFL for tasks assessed   Lower Extremity Assessment Lower Extremity Assessment: Defer to PT evaluation   Cervical / Trunk Assessment Cervical / Trunk Assessment: Other exceptions Cervical / Trunk Exceptions: s/p L2-3 posterior lumbar interbody fusion L4-5 decompressive laminectomy   Communication Communication Communication: No difficulties   Cognition Arousal/Alertness: Awake/alert Behavior During Therapy: WFL for tasks assessed/performed Overall Cognitive Status: Within Functional Limits for tasks assessed  General Comments  Husband present throughout session    Exercises     Shoulder Instructions      Home Living Family/patient expects to be discharged to:: Private residence Living Arrangements: Spouse/significant  other Available Help at Discharge: Family;Available 24 hours/day Type of Home: House Home Access: Stairs to enter [employer] of Steps: 3   Home Layout: One level     Bathroom Shower/Tub: Tub/shower unit;Door   Bathroom Toilet: Handicapped height Bathroom Accessibility: Yes   Home Equipment: Cane - single point;Bedside commode;Walker - 2 wheels          Prior Functioning/Environment Level of Independence: Independent with assistive device(s)        Comments: Performs ADLs and uses cane for funcitonal mobility due to pain        OT Problem List: Decreased strength;Decreased range of motion;Decreased activity tolerance;Impaired balance (sitting and/or standing);Pain;Decreased knowledge of precautions;Decreased knowledge of use of DME or AE      OT Treatment/Interventions:      OT Goals(Current goals can be found in the care plan section) Acute Rehab OT Goals Patient Stated Goal: Go home today OT Goal Formulation: All assessment and education complete, DC therapy  OT Frequency:     Barriers to D/C:            Co-evaluation              AM-PAC PT "6 Clicks" Daily Activity     Outcome Measure Help from another person eating meals?: None Help from another person taking care of personal grooming?: None Help from another person toileting, which includes using toliet, bedpan, or urinal?: A Little Help from another person bathing (including washing, rinsing, drying)?: A Little Help from another person to put on and taking off regular upper body clothing?: A Little Help from another person to put on and taking off regular lower body clothing?: A Little 6 Click Score: 20   End of Session Equipment Utilized During Treatment: Back brace Nurse Communication: Mobility status;Precautions  Activity Tolerance: Patient tolerated treatment well Patient left: in chair;with call bell/phone within reach;with family/visitor present  OT Visit Diagnosis:  Unsteadiness on feet (R26.81);Pain;Muscle weakness (generalized) (M62.81) Pain - part of body: (Back)                Time: 1610-9604 OT Time Calculation (min): 27 min Charges:  OT General Charges $OT Visit: 1 Visit OT Evaluation $OT Eval Low Complexity: 1 Low OT Treatments $Self Care/Home Management : 8-22 mins G-Codes:     [place], OTR/L Acute Rehab Pager: [phone] Office: [place] 09/03/2017, 8:35 AM

## 2017-09-03 NOTE — Progress Notes (Signed)
Pt doing well. Pt and husband given D/C instructions with verbal understanding. Pt's IV and Hemovacs were removed prior to D/C. Pt D/C'd home via wheelchair @ 0840 per MD order. Pt is stable @ D/C and has no other needs at this time. Holli Humbles, RN

## 2017-09-03 NOTE — Evaluation (Signed)
Physical Therapy Evaluation Patient Details Name: Selena [name] MRN: [id number] DOB: 1950/10/20 Today's Date: 09/03/2017   History of Present Illness  67 year old female who was medically yesterday the hospital underwent an L2-3 posterior lumbar interbody fusion L4-5 decompressive laminectomy.   Clinical Impression  Pt admitted with above diagnosis. Pt currently with functional limitations due to the deficits listed below (see PT Problem List). At the time of PT eval pt was able to perform transfers and ambulation with gross supervision for safety. Pt completed stair training with husband present for education and instruction for assist. Pt and husband also educated on brace wearing schedule, car transfer, and general safety with mobility progression. Pt will benefit from skilled PT to increase their independence and safety with mobility to allow discharge to the venue listed below.       Follow Up Recommendations No PT follow up;Supervision for mobility/OOB    Equipment Recommendations  None recommended by PT    Recommendations for Other Services       Precautions / Restrictions Precautions Precautions: Fall;Back Precaution Booklet Issued: Yes (comment) Precaution Comments: Pt was cued for precautions during functional mobility Required Braces or Orthoses: Spinal Brace Spinal Brace: Lumbar corset;Applied in sitting position Restrictions Weight Bearing Restrictions: No      Mobility  Bed Mobility Overal bed mobility: Needs Assistance Bed Mobility: Rolling;Sidelying to Sit Rolling: Supervision Sidelying to sit: Supervision       General bed mobility comments: Pt was received sitting up in recliner upon PT arrival.   Transfers Overall transfer level: Needs assistance Equipment used: None Transfers: Sit to/from Stand Sit to Stand: Supervision         General transfer comment: Gross supervision for safety. Pt was cued for hand placement on seated surface for safety and  improved posture with elevation to full stand.   Ambulation/Gait Ambulation/Gait assistance: Supervision Ambulation Distance (Feet): 400 Feet Assistive device: None Gait Pattern/deviations: Step-through pattern;Decreased stride length;Trunk flexed Gait velocity: Decreased Gait velocity interpretation: Below normal speed for age/gender General Gait Details: VC's for improved posture and forward gaze. Pt with occasional large arm movement during ambulation for unknown reason - appeared to be due to habit and not due to LOB.   Stairs Stairs: Yes Stairs assistance: Min guard Stair Management: Step to pattern;Backwards;Forwards Number of Stairs: 5 General stair comments: Pt and husband instructed on stair negotiation. Husband assisted pt with HHA to ascend and descend stairs with therapist guarding on her other side. Pt also instructed on backwards ascend onto stair to simulate stepping up on running board to the truck.   Wheelchair Mobility    Modified Rankin (Stroke Patients Only)       Balance Overall balance assessment: Needs assistance Sitting-balance support: No upper extremity supported;Feet supported Sitting balance-Leahy Scale: Good     Standing balance support: No upper extremity supported;During functional activity Standing balance-Leahy Scale: Fair                               Pertinent Vitals/Pain Pain Assessment: Faces Faces Pain Scale: Hurts little more Pain Location: Back Pain Descriptors / Indicators: Constant;Discomfort;Grimacing Pain Intervention(s): Monitored during session    Home Living Family/patient expects to be discharged to:: Private residence Living Arrangements: Spouse/significant other Available Help at Discharge: Family;Available 24 hours/day Type of Home: House Home Access: Stairs to enter Entrance Stairs-Rails: None Entrance Stairs-Number of Steps: 3 Home Layout: One level Home Equipment: Cane - single point;Bedside  commode;Walker -  2 wheels      Prior Function Level of Independence: Independent with assistive device(s)         Comments: Performs ADLs and uses cane for funcitonal mobility due to pain     Hand Dominance   Dominant Hand: Right    Extremity/Trunk Assessment   Upper Extremity Assessment Upper Extremity Assessment: Overall WFL for tasks assessed    Lower Extremity Assessment Lower Extremity Assessment: Generalized weakness(Consistent with pre-op diagnosis)    Cervical / Trunk Assessment Cervical / Trunk Assessment: Other exceptions Cervical / Trunk Exceptions: s/p L2-3 posterior lumbar interbody fusion L4-5 decompressive laminectomy  Communication   Communication: No difficulties  Cognition Arousal/Alertness: Awake/alert Behavior During Therapy: WFL for tasks assessed/performed Overall Cognitive Status: Within Functional Limits for tasks assessed                                        General Comments General comments (skin integrity, edema, etc.): Husband present throughout session    Exercises     Assessment/Plan    PT Assessment Patient needs continued PT services  PT Problem List Decreased strength;Decreased range of motion;Decreased activity tolerance;Decreased balance;Decreased mobility;Decreased knowledge of use of DME;Decreased safety awareness;Decreased knowledge of precautions;Pain       PT Treatment Interventions DME instruction;Gait training;Stair training;Functional mobility training;Therapeutic activities;Therapeutic exercise;Neuromuscular re-education;Patient/family education    PT Goals (Current goals can be found in the Care Plan section)  Acute Rehab PT Goals Patient Stated Goal: Go home today PT Goal Formulation: With patient/family Time For Goal Achievement: 09/10/17 Potential to Achieve Goals: Good    Frequency Min 5X/week   Barriers to discharge        Co-evaluation               AM-PAC PT "6 Clicks" Daily  Activity  Outcome Measure Difficulty turning over in bed (including adjusting bedclothes, sheets and blankets)?: None Difficulty moving from lying on back to sitting on the side of the bed? : A Little Difficulty sitting down on and standing up from a chair with arms (e.g., wheelchair, bedside commode, etc,.)?: A Little Help needed moving to and from a bed to chair (including a wheelchair)?: A Little Help needed walking in hospital room?: A Little Help needed climbing 3-5 steps with a railing? : A Little 6 Click Score: 19    End of Session Equipment Utilized During Treatment: Back brace;Gait belt Activity Tolerance: Patient tolerated treatment well Patient left: in chair;with call bell/phone within reach;with family/visitor present Nurse Communication: Mobility status PT Visit Diagnosis: Unsteadiness on feet (R26.81);Pain;Other symptoms and signs involving the nervous system (R29.898) Pain - part of body: (Back)    Time: 8413-2440 PT Time Calculation (min) (ACUTE ONLY): 13 min   Charges:   PT Evaluation $PT Eval Moderate Complexity: 1 Mod     PT G Codes:        Rolinda Roan, PT, DPT Acute Rehabilitation Services Pager: [phone]   Thelma Comp 09/03/2017, 8:59 AM

## 2017-09-03 NOTE — Discharge Summary (Signed)
Physician Discharge Summary  Patient ID: Selena [name] MRN: [id number] DOB/AGE: 12-22-1950 67 y.o. Estimated body mass index is 36.13 kg/m as calculated from the following:   Height as of this encounter: 5\' 4"  (1.626 m).   Weight as of this encounter: 95.5 kg (210 lb 8 oz).   Admit date: 09/02/2017 Discharge date: 09/03/2017  Admission Diagnoses:lumbar spinal stenosis L2-3 L4-5 with a grade 1 spondylolisthesis at L2-3.  Discharge Diagnoses: same Active Problems:   Spondylolisthesis at L2-L3 level   Discharged Condition: good  Hospital Course: 67 year old female who was medically yesterday the hospital underwent an L2-3 posterior lumbar interbody fusion L4-5 decompressive laminectomy.  She's done very well postoperatively employed avoided pain well-managed will discharge home. Scheduled follow-up 1-2 weeks.  Consults: Significant Diagnostic Studies: Treatments:left L2-3 decompression L4-5 Discharge Exam: Blood pressure 120/60, pulse 87, temperature 98.8 F (37.1 C), temperature source Oral, resp. rate 18, height 5\' 4"  (1.626 m), weight 95.5 kg (210 lb 8 oz), SpO2 95 %. Strength out of 5 wound clean dry and intact  Disposition: home   Allergies as of 09/03/2017      Reactions   Nubain [nalbuphine Hcl] Shortness Of Breath, Other (See Comments)   Difficulty breathing ??  DOSE RELATED  ??   Penicillins Itching, Rash, Other (See Comments)   PATIENT HAS HAD A PCN REACTION WITH IMMEDIATE RASH, FACIAL/TONGUE/THROAT SWELLING, SOB, OR LIGHTHEADEDNESS WITH HYPOTENSION:  #  #  #  YES  #  #  #   Has patient had a PCN reaction causing severe rash involving mucus membranes or skin necrosis: No Has patient had a PCN reaction that required hospitalization No Has patient had a PCN reaction occurring within the last 10 years: No If all of the above answers are "NO", then may proceed with Cephalosporin use.   Iohexol Swelling    Desc: Pt states she had Cardia Cath around 2000/ experienced  SOB and swelling of upper body during cath procedure/ tsf   Latex Rash      Medication List    TAKE these medications   albuterol 108 (90 Base) MCG/ACT inhaler Commonly known as:  PROVENTIL HFA;VENTOLIN HFA Inhale 2 puffs into the lungs every 4 (four) hours as needed for wheezing or shortness of breath.   aspirin EC 81 MG tablet Take 1 tablet (81 mg total) by mouth daily.   BENADRYL 25 mg capsule Generic drug:  diphenhydrAMINE Take 25 mg by mouth every 6 (six) hours as needed (for seasonal allergies.).   clobetasol 0.05 % external solution Commonly known as:  TEMOVATE Apply 1 application topically 2 (two) times daily as needed (for psoriasis on on scalp).   cyclobenzaprine 10 MG tablet Commonly known as:  FLEXERIL Take 10 mg by mouth 3 (three) times daily as needed for muscle spasms. What changed:  Another medication with the same name was added. Make sure you understand how and when to take each.   cyclobenzaprine 10 MG tablet Commonly known as:  FLEXERIL Take 1 tablet (10 mg total) by mouth 3 (three) times daily as needed for muscle spasms. What changed:  You were already taking a medication with the same name, and this prescription was added. Make sure you understand how and when to take each.   desonide 0.05 % cream Commonly known as:  DESOWEN Apply 1 application topically 2 (two) times daily as needed (for psoriasis on skin).   diclofenac sodium 1 % Gel Commonly known as:  VOLTAREN Apply 2 g topically daily as  needed (for knee pain/arthritis).   Fluticasone-Salmeterol 250-50 MCG/DOSE Aepb Commonly known as:  ADVAIR Inhale 1 puff into the lungs 2 (two) times daily.   furosemide 40 MG tablet Commonly known as:  LASIX Take 40 mg by mouth daily.   GRALISE 600 MG Tabs Generic drug:  Gabapentin (Once-Daily) Take 1,200 mg by mouth daily after supper.   KLOR-CON M10 10 MEQ tablet Generic drug:  potassium chloride Take 10 mEq by mouth 2 (two) times daily.    levothyroxine 150 MCG tablet Commonly known as:  SYNTHROID, LEVOTHROID Take 150 mcg by mouth daily.   losartan 100 MG tablet Commonly known as:  COZAAR Take 100 mg by mouth daily.   metoprolol tartrate 25 MG tablet Commonly known as:  LOPRESSOR Take 25 mg by mouth 2 (two) times daily.   morphine 15 MG tablet Commonly known as:  MSIR Take 15 mg by mouth every 6 (six) hours as needed (for pain.).   MOVANTIK 25 MG Tabs tablet Generic drug:  naloxegol oxalate Take 25 mg by mouth daily.   MYRBETRIQ 25 MG Tb24 tablet Generic drug:  mirabegron ER Take 25 mg by mouth daily.   nitroGLYCERIN 0.4 MG SL tablet Commonly known as:  NITROSTAT Place 0.4 mg under the tongue every 5 (five) minutes as needed for chest pain.   pantoprazole 40 MG tablet Commonly known as:  PROTONIX Take 1 tablet (40 mg total) by mouth 2 (two) times daily before a meal.   PREMARIN vaginal cream Generic drug:  conjugated estrogens Place 0.5 g vaginally 2 (two) times a week. Sunday & Wednesday   simvastatin 40 MG tablet Commonly known as:  ZOCOR Take 40 mg by mouth daily.   triamcinolone cream 0.1 % Commonly known as:  KENALOG Apply 1 application topically 2 (two) times daily as needed (for skin irritation). Apply to leg   Turmeric 500 MG Caps Take 500 mg by mouth daily.   vitamin C 1000 MG tablet Take 1,000 mg by mouth daily.   zolpidem 5 MG tablet Commonly known as:  AMBIEN Take 5 mg by mouth at bedtime.        Signed: Vicente Weidler P 09/03/2017, 7:41 AM

## 2017-09-03 NOTE — Discharge Instructions (Signed)
No lifting no bending no twisting no driving a riding a car unless she has come back and forth to see me. Keep incision clean dry and intact. Cover the bandage with saran wrap her Selena [name] for showers only. Wear the brace when out of bed. Walk for only 10-15 minutes at a time.

## 2017-09-03 NOTE — Progress Notes (Signed)
Patient ID: Selena [name], female   DOB: Jan 20, 1951, 67 y.o.   MRN: [id number] Patient doing well complete resolution of leg pain and low back pain  Strength 5 out of 5  DC home

## 2017-12-04 ENCOUNTER — Telehealth: Payer: Self-pay | Admitting: Cardiovascular Disease

## 2017-12-04 NOTE — Telephone Encounter (Signed)
I haven't seen her since 2017. She saw Gerrianne Scale last year. I would have her see her PCP first.

## 2017-12-04 NOTE — Telephone Encounter (Signed)
Returned pt call. She sates that her blood pressure has been up for the past month. (162/90, 150/82, 168/92, 154/84 are some of her readings) She states that she has a dull headache almost daily and she thinks it could be from her blood pressure being up. She has had a few dizzy spells, but no chest pain or sob. She takes her medication as directed. She states that she has not changed her diet and that she does not eat fast food, as it contains too much sodium. "She thinks the medicine just needs to be changedas she has been on same doses for a while now. "

## 2017-12-04 NOTE — Telephone Encounter (Signed)
Per phone call-- pt has been having problems w/ her BP. She's been having headaches all day long for the past 2 months

## 2017-12-05 NOTE — Telephone Encounter (Signed)
I gave patient Dr.Koneswaran's message and she was not pleased.She stated she saw her pcp already and was told to keep taking her medication.She then stated if she had a stroke or heart attack, someone would be sued.She then ended call. I called back to offer her an apt with M lenze PA-C for 5/21 at 1230 and asked her to confirm if she wanted apt or not

## 2017-12-18 ENCOUNTER — Emergency Department (HOSPITAL_COMMUNITY)
Admission: EM | Admit: 2017-12-18 | Discharge: 2017-12-18 | Disposition: A | Discharge status: Home / Self Care | Payer: BLUE CROSS/BLUE SHIELD | Attending: Emergency Medicine | Admitting: Emergency Medicine

## 2017-12-18 ENCOUNTER — Other Ambulatory Visit: Payer: Self-pay

## 2017-12-18 ENCOUNTER — Encounter (HOSPITAL_COMMUNITY): Payer: Self-pay | Admitting: Emergency Medicine

## 2017-12-18 DIAGNOSIS — Z5321 Procedure and treatment not carried out due to patient leaving prior to being seen by health care provider: Secondary | ICD-10-CM | POA: Insufficient documentation

## 2017-12-18 DIAGNOSIS — I10 Essential (primary) hypertension: Secondary | ICD-10-CM | POA: Insufficient documentation

## 2017-12-18 NOTE — ED Notes (Signed)
Pt left at this time.

## 2017-12-18 NOTE — ED Triage Notes (Signed)
Pt reports HA and HTN for over a month, has asked her PCP about this and states to continue BP medications. Medications have not been adjusted in years. ASA at home for HA without improvement.

## 2017-12-23 ENCOUNTER — Ambulatory Visit: Payer: BLUE CROSS/BLUE SHIELD | Admitting: Physician Assistant

## 2017-12-25 ENCOUNTER — Other Ambulatory Visit (HOSPITAL_COMMUNITY): Payer: Self-pay | Admitting: Internal Medicine

## 2017-12-25 ENCOUNTER — Ambulatory Visit (HOSPITAL_COMMUNITY)
Admission: RE | Admit: 2017-12-25 | Discharge: 2017-12-25 | Disposition: A | Discharge status: Home / Self Care | Payer: BLUE CROSS/BLUE SHIELD | Source: Ambulatory Visit | Attending: Internal Medicine | Admitting: Internal Medicine

## 2017-12-25 DIAGNOSIS — R05 Cough: Secondary | ICD-10-CM | POA: Insufficient documentation

## 2017-12-25 DIAGNOSIS — R059 Cough, unspecified: Secondary | ICD-10-CM

## 2018-02-26 ENCOUNTER — Other Ambulatory Visit: Payer: Self-pay | Admitting: Student

## 2018-02-26 DIAGNOSIS — M545 Low back pain, unspecified: Secondary | ICD-10-CM

## 2018-03-10 ENCOUNTER — Ambulatory Visit
Admission: RE | Admit: 2018-03-10 | Discharge: 2018-03-10 | Disposition: A | Discharge status: Home / Self Care | Payer: BLUE CROSS/BLUE SHIELD | Source: Ambulatory Visit | Attending: Student | Admitting: Student

## 2018-03-10 DIAGNOSIS — M545 Low back pain, unspecified: Secondary | ICD-10-CM

## 2018-08-07 ENCOUNTER — Other Ambulatory Visit (HOSPITAL_COMMUNITY): Payer: Self-pay | Admitting: Internal Medicine

## 2018-08-07 DIAGNOSIS — Z1231 Encounter for screening mammogram for malignant neoplasm of breast: Secondary | ICD-10-CM

## 2018-08-14 ENCOUNTER — Ambulatory Visit (HOSPITAL_COMMUNITY): Payer: BLUE CROSS/BLUE SHIELD

## 2018-09-29 ENCOUNTER — Ambulatory Visit (HOSPITAL_COMMUNITY)
Admission: RE | Admit: 2018-09-29 | Discharge: 2018-09-29 | Disposition: A | Discharge status: Home / Self Care | Payer: BLUE CROSS/BLUE SHIELD | Source: Ambulatory Visit | Attending: Internal Medicine | Admitting: Internal Medicine

## 2018-09-29 DIAGNOSIS — Z1231 Encounter for screening mammogram for malignant neoplasm of breast: Secondary | ICD-10-CM | POA: Insufficient documentation

## 2018-10-07 ENCOUNTER — Ambulatory Visit: Payer: BLUE CROSS/BLUE SHIELD | Admitting: Cardiovascular Disease

## 2018-10-27 ENCOUNTER — Other Ambulatory Visit: Payer: Self-pay | Admitting: Neurosurgery

## 2018-10-27 DIAGNOSIS — M47817 Spondylosis without myelopathy or radiculopathy, lumbosacral region: Secondary | ICD-10-CM

## 2018-10-29 ENCOUNTER — Other Ambulatory Visit: Payer: BLUE CROSS/BLUE SHIELD

## 2018-12-01 ENCOUNTER — Other Ambulatory Visit: Payer: Self-pay

## 2018-12-01 DIAGNOSIS — R600 Localized edema: Secondary | ICD-10-CM

## 2018-12-09 ENCOUNTER — Other Ambulatory Visit: Payer: Self-pay

## 2018-12-09 ENCOUNTER — Encounter: Payer: Self-pay | Admitting: *Deleted

## 2018-12-09 ENCOUNTER — Ambulatory Visit: Payer: BLUE CROSS/BLUE SHIELD | Admitting: Vascular Surgery

## 2018-12-09 ENCOUNTER — Encounter: Payer: Self-pay | Admitting: Vascular Surgery

## 2018-12-09 ENCOUNTER — Ambulatory Visit (HOSPITAL_COMMUNITY)
Admission: RE | Admit: 2018-12-09 | Discharge: 2018-12-09 | Disposition: A | Discharge status: Home / Self Care | Payer: BLUE CROSS/BLUE SHIELD | Source: Ambulatory Visit | Attending: Vascular Surgery | Admitting: Vascular Surgery

## 2018-12-09 VITALS — BP 137/69 | HR 103 | Temp 98.6°F | Resp 20 | Ht 63.0 in | Wt 214.4 lb

## 2018-12-09 DIAGNOSIS — R600 Localized edema: Secondary | ICD-10-CM

## 2018-12-09 NOTE — Progress Notes (Signed)
Vascular and Vein Specialist of [place]  Patient name: Selena [name] MRN: [id number] DOB: [birth year] Sex: female  REASON FOR CONSULT: Evaluation bilateral lower extremity swelling  HPI: Selena [name] is a 68 y.o. female, who is here today for evaluation of bilateral lower extremity swelling.  She reports this is been present for many years.  She has had bilateral total knee replacements and reports some change in telangiectasia on her left leg in her pretibial area and ankle following this.  She has no history of DVT.  She does wear compression garments and sleeps in a recliner due to chronic back pain.  He has knee-high and thigh-high compression but mainly wears knee-high.  She has had some changes in her retention treatment and reports that she has noted more swelling following this.  Does take Lasix.  Past Medical History:  Diagnosis Date  . Abnormal findings on imaging of biliary tract 1997-2000   CBD 42mm, Dr Albin Felling biliary/pancreatic manometry, EUS Dr. Ardis Hughs 09/18/07 dilated but otherwise normal extrahepatic duct, no masses  . Arthritis   . Bursitis   . Bursitis    shoulder and both hips  . CAD (coronary artery disease)   . Cervical ca ([place]) 1984  . Chronic abdominal pain    functional, seen @[facility] , extensive WU here benign   . 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 Complication of anesthesia    years ago woke up during surgery  . Constipation   . COPD (chronic obstructive pulmonary disease) ([place])   . Depression    w/ menopause  . Dysrhythmia    AFib  . GERD (gastroesophageal reflux disease)   . H/O hiatal hernia   . Headache    hx of migraines in past  . Heart murmur   . Hemorrhoids   . HTN (hypertension)   . Hyperlipemia   . Hypothyroidism   . MI (myocardial infarction) ([place]) 2000  . Pelvic pain in female 05/24/2015  . 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 PUD (peptic ulcer disease) 1997/1998   negative h pylori  . S/P colonoscopy 09/10/07   Dr. Donald Pore sec to tortuous colon, followed by BE & flex sig normal  . Shortness of breath    with exertion  . Skin cancer    nose  . Stroke ([place])    1983  . Tachycardia    being evaluated recently by Dr. Rollene Fare  . TIA (transient ischemic attack) 1983    Family History  Problem Relation Age of Onset  . Ulcers Brother   . Heart attack Brother   . Ulcers Sister   . Thyroid disease Daughter   . Hypertension Daughter   . Other Daughter        nervous  . Hypertension Sister   . Heart disease Sister   . Thyroid disease Sister   . Hypertension Sister   . Heart disease Sister   . Thyroid disease Sister   . Heart attack Brother   . Cancer Brother        prostate    SOCIAL HISTORY: Social History   Socioeconomic History  . Marital status: Married    Spouse name: Not on file  . Number of children: 1  . Years of education: Not on file  . Highest education level: Not on file  Occupational History  . Occupation: Training and development officer: [employer]  Social Needs  . Financial resource strain: Not on file  .  Food insecurity:    Worry: Not on file    Inability: Not on file  . Transportation needs:    Medical: Not on file    Non-medical: Not on file  Tobacco Use  . Smoking status: Former Smoker    Packs/day: 1.00    Years: 22.00    Pack years: 22.00    Types: Cigarettes    Start date: 08/06/1981    Last attempt to quit: 01/05/2004    Years since quitting: 14.9  . Smokeless tobacco: Never Used  . Tobacco comment: Quit in 2005  Substance and Sexual Activity  . Alcohol use: No    Alcohol/week: 0.0 standard drinks  . Drug use: No  . Sexual activity: Never    Birth control/protection: Surgical    Comment: hyst  Lifestyle  . Physical activity:    Days per week: Not on file    Minutes per session: Not on file  . Stress: Not on file  Relationships  . Social connections:    Talks on phone: Not on file     Gets together: Not on file    Attends religious service: Not on file    Active member of club or organization: Not on file    Attends meetings of clubs or organizations: Not on file    Relationship status: Not on file  . Intimate partner violence:    Fear of current or ex partner: Not on file    Emotionally abused: Not on file    Physically abused: Not on file    Forced sexual activity: Not on file  Other Topics Concern  . Not on file  Social History Narrative  . Not on file    Allergies  Allergen Reactions  . Nubain [Nalbuphine Hcl] Shortness Of Breath and Other (See Comments)    Difficulty breathing ??  DOSE RELATED  ??  . Penicillins Itching, Rash and Other (See Comments)    PATIENT HAS HAD A PCN REACTION WITH IMMEDIATE RASH, FACIAL/TONGUE/THROAT SWELLING, SOB, OR LIGHTHEADEDNESS WITH HYPOTENSION:  #  #  #  YES  #  #  #   Has patient had a PCN reaction causing severe rash involving mucus membranes or skin necrosis: No Has patient had a PCN reaction that required hospitalization No Has patient had a PCN reaction occurring within the last 10 years: No If all of the above answers are "NO", then may proceed with Cephalosporin use.   . Iodinated Diagnostic Agents     Other reaction(s): SWELLING  . Iohexol Swelling     Desc: Pt states she had Cardia Cath around 2000/ experienced SOB and swelling of upper body during cath procedure/ tsf   . Latex Rash    Current Outpatient Medications  Medication Sig Dispense Refill  . albuterol (PROVENTIL HFA;VENTOLIN HFA) 108 (90 Base) MCG/ACT inhaler Inhale 2 puffs into the lungs every 4 (four) hours as needed for wheezing or shortness of breath. 1 Inhaler 0  . Ascorbic Acid (VITAMIN C) 1000 MG tablet Take 1,000 mg by mouth daily.    Marland Kitchen aspirin EC 81 MG tablet Take 1 tablet (81 mg total) by mouth daily. 90 tablet 3  . chlorthalidone (HYGROTON) 25 MG tablet Take 25 mg by mouth daily.    . clobetasol (TEMOVATE) 0.05 % external solution Apply 1  application topically 2 (two) times daily as needed (for psoriasis on on scalp).   3  . cyclobenzaprine (FLEXERIL) 10 MG tablet Take 10 mg by mouth 3 (three) times  daily as needed for muscle spasms.     Marland Kitchen desonide (DESOWEN) 0.05 % cream Apply 1 application topically 2 (two) times daily as needed (for psoriasis on skin).   3  . diclofenac sodium (VOLTAREN) 1 % GEL Apply 2 g topically daily as needed (for knee pain/arthritis).     Marland Kitchen diphenhydrAMINE (BENADRYL) 25 mg capsule Take 25 mg by mouth every 6 (six) hours as needed (for seasonal allergies.).     Marland Kitchen Fluticasone-Salmeterol (ADVAIR) 250-50 MCG/DOSE AEPB Inhale 1 puff into the lungs 2 (two) times daily.    . furosemide (LASIX) 40 MG tablet Take 40 mg by mouth daily.     Marland Kitchen GRALISE 600 MG TABS Take 1,200 mg by mouth daily after supper.     Marland Kitchen KLOR-CON M10 10 MEQ tablet Take 10 mEq by mouth 2 (two) times daily.    Marland Kitchen levothyroxine (SYNTHROID) 137 MCG tablet Take 137 mcg by mouth daily before breakfast.    . losartan (COZAAR) 100 MG tablet Take 100 mg by mouth daily.    . metoprolol tartrate (LOPRESSOR) 25 MG tablet Take 25 mg by mouth 2 (two) times daily.     . mirabegron ER (MYRBETRIQ) 25 MG TB24 tablet Take 25 mg by mouth daily.    Marland Kitchen morphine (MSIR) 15 MG tablet Take 15 mg by mouth every 6 (six) hours as needed (for pain.).     Marland Kitchen MOVANTIK 25 MG TABS tablet Take 25 mg by mouth daily.     . nitroGLYCERIN (NITROSTAT) 0.4 MG SL tablet Place 0.4 mg under the tongue every 5 (five) minutes as needed for chest pain.     . pantoprazole (PROTONIX) 40 MG tablet Take 1 tablet (40 mg total) by mouth 2 (two) times daily before a meal. 180 tablet 3  . PREMARIN vaginal cream Place 0.5 g vaginally 2 (two) times a week. Sunday & Wednesday  6  . simvastatin (ZOCOR) 40 MG tablet Take 40 mg by mouth daily.     Marland Kitchen triamcinolone cream (KENALOG) 0.1 % Apply 1 application topically 2 (two) times daily as needed (for skin irritation). Apply to leg     . Turmeric 500 MG CAPS  Take 500 mg by mouth daily.    Marland Kitchen zolpidem (AMBIEN) 5 MG tablet Take 5 mg by mouth at bedtime.     No current facility-administered medications for this visit.     REVIEW OF SYSTEMS:  [X]  denotes positive finding, [ ]  denotes negative finding Cardiac  Comments:  Chest pain or chest pressure:    Shortness of breath upon exertion: x   Short of breath when lying flat:    Irregular heart rhythm:        Vascular    Pain in calf, thigh, or hip brought on by ambulation:    Pain in feet at night that wakes you up from your sleep:     Blood clot in your veins:    Leg swelling:  x       Pulmonary    Oxygen at home:    Productive cough:     Wheezing:         Neurologic    Sudden weakness in arms or legs:     Sudden numbness in arms or legs:     Sudden onset of difficulty speaking or slurred speech:    Temporary loss of vision in one eye:     Problems with dizziness:         Gastrointestinal  Blood in stool:     Vomited blood:         Genitourinary    Burning when urinating:     Blood in urine:        Psychiatric    Major depression:         Hematologic    Bleeding problems:    Problems with blood clotting too easily:        Skin    Rashes or ulcers: x       Constitutional    Fever or chills:      PHYSICAL EXAM: There were no vitals filed for this visit.  GENERAL: The patient is a well-nourished female, in no acute distress. The vital signs are documented above. CARDIOVASCULAR: 2+ radial and 2+ dorsalis pedis pulses bilaterally.  Scattered raised telangiectasia on her left pretibial and left ankle region.  No evidence of varicosities PULMONARY: There is good air exchange  ABDOMEN: Soft and non-tender  MUSCULOSKELETAL: There are no major deformities or cyanosis. NEUROLOGIC: No focal weakness or paresthesias are detected. SKIN: There are no ulcers or rashes noted.  Mild erythema in her right leg distally but no hemosiderin deposits noted PSYCHIATRIC: The patient has  a normal affect.  DATA:  Noninvasive studies revealed no evidence of DVT.  She does have reflux in her common femoral vein on the right and throughout her saphenous vein in the distal thigh.  She has slightly dilated great saphenous vein bilaterally.  She has no reflux demonstrated in her left leg.  MEDICAL ISSUES: I discussed these findings at length with the patient.  I suspect that she does have mild reflux on the left although we were not able to identify it by duplex today.  I would not recommend any consideration of ablation at this time since I feel that it would give her minimal benefit.  I did explain the importance of elevation and compression which she is currently doing.  As progressive changes and more dilatation and reflux in her saphenous vein in the future could be considered for ablation treatment.  She was reassured with this discussion will see [place] again on an as-needed basis   Rosetta Posner, MD [facility] Vascular and Vein Specialists of [facility] Tel [phone] Pager [phone]

## 2018-12-25 ENCOUNTER — Ambulatory Visit
Admission: RE | Admit: 2018-12-25 | Discharge: 2018-12-25 | Disposition: A | Discharge status: Home / Self Care | Payer: BLUE CROSS/BLUE SHIELD | Source: Ambulatory Visit | Attending: Neurosurgery | Admitting: Neurosurgery

## 2018-12-25 ENCOUNTER — Other Ambulatory Visit: Payer: Self-pay

## 2018-12-25 DIAGNOSIS — M47817 Spondylosis without myelopathy or radiculopathy, lumbosacral region: Secondary | ICD-10-CM

## 2019-01-24 ENCOUNTER — Other Ambulatory Visit: Payer: Self-pay

## 2019-01-24 ENCOUNTER — Ambulatory Visit
Admission: RE | Admit: 2019-01-24 | Discharge: 2019-01-24 | Disposition: A | Discharge status: Home / Self Care | Payer: BLUE CROSS/BLUE SHIELD | Source: Ambulatory Visit | Attending: Neurosurgery | Admitting: Neurosurgery

## 2019-01-24 DIAGNOSIS — M47817 Spondylosis without myelopathy or radiculopathy, lumbosacral region: Secondary | ICD-10-CM

## 2019-04-09 ENCOUNTER — Ambulatory Visit: Payer: BC Managed Care – PPO | Admitting: Cardiovascular Disease

## 2019-04-09 ENCOUNTER — Encounter: Payer: Self-pay | Admitting: Cardiovascular Disease

## 2019-04-09 ENCOUNTER — Other Ambulatory Visit: Payer: Self-pay

## 2019-04-09 VITALS — BP 132/72 | HR 99 | Temp 96.9°F | Ht 65.0 in | Wt 212.0 lb

## 2019-04-09 DIAGNOSIS — I499 Cardiac arrhythmia, unspecified: Secondary | ICD-10-CM

## 2019-04-09 DIAGNOSIS — Z01818 Encounter for other preprocedural examination: Secondary | ICD-10-CM

## 2019-04-09 DIAGNOSIS — I10 Essential (primary) hypertension: Secondary | ICD-10-CM

## 2019-04-09 DIAGNOSIS — Z87898 Personal history of other specified conditions: Secondary | ICD-10-CM | POA: Diagnosis not present

## 2019-04-09 MED ORDER — METOPROLOL TARTRATE 50 MG PO TABS: 50.0000 mg | ORAL_TABLET | Freq: Two times a day (BID) | ORAL | 3 refills | Status: DC

## 2019-04-09 NOTE — Patient Instructions (Signed)
Medication Instructions: INCREASE lopressor to 50 mg twice a day  Labwork: NONE  Procedures/Testing: NONE  Follow-Up: As needed with Dr.Koneswaran  Any Additional Special Instructions Will Be Listed Below (If Applicable).     Thank you for choosing [facility] !

## 2019-04-09 NOTE — Progress Notes (Signed)
SUBJECTIVE: The patient presents for past due follow-up.  The only time I saw her was in 2017.  She saw Gerrianne Scale PA-C in 2018.  She has a history of chest pain.  She underwent a nuclear stress test on 07/18/2017 with findings suggestive of very mild apical ischemia versus differences in apical thinning.  It was deemed low risk from a perfusion standpoint but high risk based on decreased LVEF and elevated 3 times daily of 1.49.  LVEF calculated at 32%.  She then underwent an echocardiogram on 07/18/2017 which demonstrated normal left ventricular systolic function, LVEF 55 to 60%.  There was grade 1 diastolic dysfunction and mild aortic regurgitation.  She has a history of leg swelling due to venous insufficiency and has been evaluated by vascular surgery with no surgical interventions recommended.  She presents for Preoperative risk stratification as she needs back surgery again.  She underwent back surgery in January 2019 by Dr. Saintclair Halsted.  She has episodic chest pains at rest.  She has chest pressure when she is very stressed or anxious.  She does have periodic palpitations.  She has bilateral venous varicosities and occasional swelling with that.  She wears compression stockings when it is not hot outside.    Review of Systems: As per "subjective", otherwise negative.  Allergies  Allergen Reactions   Nubain [Nalbuphine Hcl] Shortness Of Breath and Other (See Comments)    Difficulty breathing ??  DOSE RELATED  ??   Penicillins Itching, Rash and Other (See Comments)    PATIENT HAS HAD A PCN REACTION WITH IMMEDIATE RASH, FACIAL/TONGUE/THROAT SWELLING, SOB, OR LIGHTHEADEDNESS WITH HYPOTENSION:  #  #  #  YES  #  #  #   Has patient had a PCN reaction causing severe rash involving mucus membranes or skin necrosis: No Has patient had a PCN reaction that required hospitalization No Has patient had a PCN reaction occurring within the last 10 years: No If all of the above answers are "NO",  then may proceed with Cephalosporin use.    Iodinated Diagnostic Agents     Other reaction(s): SWELLING   Iohexol Swelling     Desc: Pt states she had Cardia Cath around 2000/ experienced SOB and swelling of upper body during cath procedure/ tsf    Latex Rash    Current Outpatient Medications  Medication Sig Dispense Refill   albuterol (PROVENTIL HFA;VENTOLIN HFA) 108 (90 Base) MCG/ACT inhaler Inhale 2 puffs into the lungs every 4 (four) hours as needed for wheezing or shortness of breath. 1 Inhaler 0   Ascorbic Acid (VITAMIN C) 1000 MG tablet Take 1,000 mg by mouth daily.     aspirin EC 81 MG tablet Take 1 tablet (81 mg total) by mouth daily. 90 tablet 3   chlorthalidone (HYGROTON) 25 MG tablet Take 25 mg by mouth daily.     clobetasol (TEMOVATE) 0.05 % external solution Apply 1 application topically 2 (two) times daily as needed (for psoriasis on on scalp).   3   cyclobenzaprine (FLEXERIL) 10 MG tablet Take 10 mg by mouth 3 (three) times daily as needed for muscle spasms.      desonide (DESOWEN) 0.05 % cream Apply 1 application topically 2 (two) times daily as needed (for psoriasis on skin).   3   diclofenac sodium (VOLTAREN) 1 % GEL Apply 2 g topically daily as needed (for knee pain/arthritis).      diphenhydrAMINE (BENADRYL) 25 mg capsule Take 25 mg by mouth every  6 (six) hours as needed (for seasonal allergies.).      Fluticasone-Salmeterol (ADVAIR) 250-50 MCG/DOSE AEPB Inhale 1 puff into the lungs 2 (two) times daily.     furosemide (LASIX) 40 MG tablet Take 40 mg by mouth daily.      GRALISE 600 MG TABS Take 1,200 mg by mouth daily after supper.      KLOR-CON M10 10 MEQ tablet Take 10 mEq by mouth 2 (two) times daily.     levothyroxine (SYNTHROID) 137 MCG tablet Take 137 mcg by mouth daily before breakfast.     losartan (COZAAR) 100 MG tablet Take 100 mg by mouth daily.     metoprolol tartrate (LOPRESSOR) 25 MG tablet Take 25 mg by mouth 2 (two) times daily.        mirabegron ER (MYRBETRIQ) 25 MG TB24 tablet Take 25 mg by mouth daily.     morphine (MSIR) 15 MG tablet Take 15 mg by mouth every 6 (six) hours as needed (for pain.).      MOVANTIK 25 MG TABS tablet Take 25 mg by mouth daily.      nitroGLYCERIN (NITROSTAT) 0.4 MG SL tablet Place 0.4 mg under the tongue every 5 (five) minutes as needed for chest pain.      pantoprazole (PROTONIX) 40 MG tablet Take 1 tablet (40 mg total) by mouth 2 (two) times daily before a meal. 180 tablet 3   PREMARIN vaginal cream Place 0.5 g vaginally 2 (two) times a week. Sunday & Wednesday  6   simvastatin (ZOCOR) 40 MG tablet Take 40 mg by mouth daily.      triamcinolone cream (KENALOG) 0.1 % Apply 1 application topically 2 (two) times daily as needed (for skin irritation). Apply to leg      Turmeric 500 MG CAPS Take 500 mg by mouth daily.     zolpidem (AMBIEN) 5 MG tablet Take 5 mg by mouth at bedtime.     No current facility-administered medications for this visit.     Past Medical History:  Diagnosis Date   Abnormal findings on imaging of biliary tract 1997-2000   CBD 90mm, Dr Albin Felling biliary/pancreatic manometry, EUS Dr. Ardis Hughs 09/18/07 dilated but otherwise normal extrahepatic duct, no masses   Arthritis    Bursitis    Bursitis    shoulder and both hips   CAD (coronary artery disease)    Cervical ca [facility]) 1984   Chronic abdominal pain    functional, seen @[facility] , extensive WU here benign    Chronic back pain    on methadone   Complication of anesthesia    years ago woke up during surgery   Constipation    COPD (chronic obstructive pulmonary disease) ([place])    Depression    w/ menopause   Dysrhythmia    AFib   GERD (gastroesophageal reflux disease)    H/O hiatal hernia    Headache    hx of migraines in past   Heart murmur    Hemorrhoids    HTN (hypertension)    Hyperlipemia    Hypothyroidism    MI (myocardial infarction) ([place]) 2000   Pelvic pain in female  05/24/2015   Prolapse of vaginal vault after hysterectomy 05/24/2015   PUD (peptic ulcer disease) 1997/1998   negative h pylori   S/P colonoscopy 09/10/07   Dr. Donald Pore sec to tortuous colon, followed by BE & flex sig normal   Shortness of breath    with exertion   Skin cancer  nose   Stroke ([place])    1983   Tachycardia    being evaluated recently by Dr. Rollene Fare   TIA (transient ischemic attack) 1983    Past Surgical History:  Procedure Laterality Date   ABDOMINAL HYSTERECTOMY     APPENDECTOMY     arthroscopic knee     CATARACT EXTRACTION W/PHACO Right 09/02/2014   Procedure: CATARACT EXTRACTION PHACO AND INTRAOCULAR LENS PLACEMENT RIGHT EYE;  Surgeon: Tonny Branch, MD;  Location: [facility];  Service: Ophthalmology;  Laterality: Right;  CDE:8.96   CATARACT EXTRACTION W/PHACO Left 09/13/2014   Procedure: CATARACT EXTRACTION PHACO AND INTRAOCULAR LENS PLACEMENT LEFT EYE;  Surgeon: Tonny Branch, MD;  Location: [facility];  Service: Ophthalmology;  Laterality: Left;  CDE 6.20   CHOLECYSTECTOMY     INGUINAL HERNIA REPAIR     left   JOINT REPLACEMENT Bilateral    knees   PARTIAL HYSTERECTOMY     right arm     nerve   TONSILLECTOMY     TONSILLECTOMY     TOTAL KNEE ARTHROPLASTY  06/20/2012   Procedure: TOTAL KNEE ARTHROPLASTY;  Surgeon: Yvette Rack., MD;  Location: [place];  Service: Orthopedics;  Laterality: Right;   TOTAL KNEE ARTHROPLASTY Left 04/02/2014   Procedure: TOTAL KNEE ARTHROPLASTY;  Surgeon: Yvette Rack., MD;  Location: [place];  Service: Orthopedics;  Laterality: Left;    Social History   Socioeconomic History   Marital status: Married    Spouse name: Not on file   Number of children: 1   Years of education: Not on file   Highest education level: Not on file  Occupational History   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: Not on file   Food insecurity    Worry: Not on file      Inability: Not on file   Transportation needs    Medical: Not on file    Non-medical: Not on file  Tobacco Use   Smoking status: Former Smoker    Packs/day: 1.00    Years: 22.00    Pack years: 22.00    Types: Cigarettes    Start date: 08/06/1981    Quit date: 01/05/2004    Years since quitting: 15.2   Smokeless tobacco: Never Used   Tobacco comment: Quit in 2005  Substance and Sexual Activity   Alcohol use: No    Alcohol/week: 0.0 standard drinks   Drug use: No   Sexual activity: Never    Birth control/protection: Surgical    Comment: hyst  Lifestyle   Physical activity    Days per week: Not on file    Minutes per session: Not on file   Stress: Not on file  Relationships   Social connections    Talks on phone: Not on file    Gets together: Not on file    Attends religious service: Not on file    Active member of club or organization: Not on file    Attends meetings of clubs or organizations: Not on file    Relationship status: Not on file   Intimate partner violence    Fear of current or ex partner: Not on file    Emotionally abused: Not on file    Physically abused: Not on file    Forced sexual activity: Not on file  Other Topics Concern   Not on file  Social History Narrative   Not on file  Vitals:   04/09/19 0920  BP: 132/72  Pulse: 99  Temp: (!) 96.9 F (36.1 C)  SpO2: 96%  Weight: 212 lb (96.2 kg)  Height: 5\' 5"  (1.651 m)    Wt Readings from Last 3 Encounters:  04/09/19 212 lb (96.2 kg)  12/09/18 214 lb 6.4 oz (97.3 kg)  12/18/17 212 lb (96.2 kg)     PHYSICAL EXAM General: NAD HEENT: Normal. Neck: No JVD, no thyromegaly. Lungs: Clear to auscultation bilaterally with normal respiratory effort. CV: Tachycardic, irregular rhythm, normal S1/S2, no S3/S4, no murmur. No pretibial or periankle edema.  Bilateral venous varicosities.  No carotid bruits. Abdomen: Soft, nontender, no distention.  Neurologic: Alert and oriented.   Psych: Normal affect. Skin: Normal. Musculoskeletal: No gross deformities.    ECG: Sinus tachycardia with PVCs, 112 bpm.   Labs: Lab Results  Component Value Date/Time   K 4.2 09/02/2017 05:08 PM   BUN 17 08/28/2017 02:47 PM   CREATININE 0.94 08/28/2017 02:47 PM   ALT 24 06/02/2016 07:36 PM   TSH 4.096 06/21/2012 10:00 AM   HGB 10.5 (L) 09/02/2017 05:08 PM     Lipids: Lab Results  Component Value Date/Time   LDLCALC  05/14/2008 05:00 AM    80        Total Cholesterol/HDL:CHD Risk Coronary Heart Disease Risk Table                     Men   Women  1/2 Average Risk   3.4   3.3   CHOL  05/14/2008 05:00 AM    180        ATP III CLASSIFICATION:  <200     mg/dL   Desirable  200-239  mg/dL   Borderline High  >=240    mg/dL   High   TRIG 296 (H) 05/14/2008 05:00 AM   HDL 41 05/14/2008 05:00 AM       ASSESSMENT AND PLAN: 1.  Arrhythmia: She has sinus tachycardia with PVCs.  She is on Lopressor 25 mg twice daily I will increase to 50 mg twice daily.  2.  Hypertension: Blood pressures controlled.  Continue to monitor given increase of Lopressor.  3.  Chest pain: This appears to be atypical. She underwent a low risk nuclear stress test as per myocardial perfusion in 2018 and has normal LV systolic function echocardiogram at that time.  No further cardiac testing is indicated.    4.  Preoperative risk stratification: She had no cardiac issues with prior back surgery in January 2019.  She underwent a low risk nuclear stress test as per myocardial perfusion in 2018 and has normal LV systolic function echocardiogram at that time.  No further cardiac testing is indicated.  She can proceed with surgery with acceptable level of risk.    Disposition: Follow up prn   Kate Sable, M.D., F.A.C.C.

## 2019-06-29 ENCOUNTER — Other Ambulatory Visit: Payer: Self-pay | Admitting: Neurosurgery

## 2019-06-30 ENCOUNTER — Telehealth: Payer: Self-pay | Admitting: *Deleted

## 2019-06-30 ENCOUNTER — Other Ambulatory Visit: Payer: Self-pay | Admitting: *Deleted

## 2019-06-30 NOTE — Telephone Encounter (Signed)
Close encounter 

## 2019-07-22 ENCOUNTER — Encounter: Payer: Self-pay | Admitting: Vascular Surgery

## 2019-08-04 ENCOUNTER — Ambulatory Visit: Payer: BC Managed Care – PPO | Attending: Internal Medicine

## 2019-08-04 ENCOUNTER — Other Ambulatory Visit: Payer: Self-pay

## 2019-08-04 DIAGNOSIS — Z20822 Contact with and (suspected) exposure to covid-19: Secondary | ICD-10-CM

## 2019-08-06 LAB — NOVEL CORONAVIRUS, NAA: SARS-CoV-2, NAA: NOT DETECTED

## 2019-08-13 DIAGNOSIS — T402X5A Adverse effect of other opioids, initial encounter: Secondary | ICD-10-CM | POA: Diagnosis present

## 2019-08-18 ENCOUNTER — Encounter: Payer: BC Managed Care – PPO | Admitting: Vascular Surgery

## 2019-08-25 ENCOUNTER — Encounter: Payer: BC Managed Care – PPO | Admitting: Vascular Surgery

## 2019-08-27 ENCOUNTER — Inpatient Hospital Stay (HOSPITAL_COMMUNITY): Admission: RE | Admit: 2019-08-27 | Payer: BC Managed Care – PPO | Source: Ambulatory Visit

## 2019-08-27 ENCOUNTER — Other Ambulatory Visit (HOSPITAL_COMMUNITY): Payer: BC Managed Care – PPO

## 2019-08-31 ENCOUNTER — Inpatient Hospital Stay (HOSPITAL_COMMUNITY): Admission: RE | Admit: 2019-08-31 | Payer: BC Managed Care – PPO | Source: Home / Self Care | Admitting: Neurosurgery

## 2019-08-31 ENCOUNTER — Encounter (HOSPITAL_COMMUNITY): Admission: RE | Payer: Self-pay | Source: Home / Self Care

## 2019-08-31 SURGERY — ANTERIOR LUMBAR FUSION 1 LEVEL
Anesthesia: General

## 2019-09-02 ENCOUNTER — Other Ambulatory Visit: Payer: Self-pay | Admitting: Neurosurgery

## 2019-09-03 ENCOUNTER — Other Ambulatory Visit: Payer: Self-pay

## 2019-09-09 ENCOUNTER — Telehealth: Payer: Self-pay

## 2019-09-09 NOTE — Telephone Encounter (Signed)
Tried to reach pt several times, no answer. Left VM to confirm she has an appt on 2/23 to see Dr. Carlis Abbott prior to her ALIF surgery.

## 2019-09-29 ENCOUNTER — Encounter: Payer: Self-pay | Admitting: Vascular Surgery

## 2019-09-29 ENCOUNTER — Other Ambulatory Visit: Payer: Self-pay

## 2019-09-29 ENCOUNTER — Telehealth: Payer: Self-pay | Admitting: Cardiovascular Disease

## 2019-09-29 ENCOUNTER — Ambulatory Visit: Payer: BC Managed Care – PPO | Admitting: Vascular Surgery

## 2019-09-29 VITALS — BP 145/96 | HR 99 | Temp 97.9°F | Resp 16 | Ht 65.0 in | Wt 215.0 lb

## 2019-09-29 DIAGNOSIS — G8929 Other chronic pain: Secondary | ICD-10-CM | POA: Diagnosis not present

## 2019-09-29 DIAGNOSIS — M545 Low back pain, unspecified: Secondary | ICD-10-CM

## 2019-09-29 NOTE — Telephone Encounter (Signed)
Pt left message stating she's been having Chest Pain

## 2019-09-29 NOTE — Progress Notes (Signed)
Patient name: Selena [name] MRN: [id number] DOB: 1950-08-13 Sex: female  REASON FOR CONSULT: L5-S1 ALIF  HPI: Selena [name] is a 69 y.o. female, with history of COPD, chronic back pain, hypertension, hyperlipidemia, coronary artery disease status post remote MI, previous TIA stroke, obesity that presents for preop evaluation prior to planned L5-S1 ALIF with Dr. Saintclair Halsted.  Patient reports that she has had lower back pain for years.  She previously underwent surgery with Dr. Saintclair Halsted from posterior approach on 09/02/17.  This included L2-L3 lumbar laminectomies with posterior lumbar interbody fusion at L2-L3 and decompressive lumbar laminectomy at L4-L5 bilaterally.   Ultimately Dr. Saintclair Halsted has now recommended an L5-S1 anterior approach given her ongoing issues and chronic back pain.  She reports previous abdominal surgery includes laparoscopic cholecystectomy as well as laparoscopic partial tubal ligation.  No other large abdominal incisions.  She is in fairly significant pain today and states she has trouble even getting on the table.  Former smoker, quit 2005.  Past Medical History:  Diagnosis Date  . Abnormal findings on imaging of biliary tract 1997-2000   CBD 14mm, Dr Albin Felling biliary/pancreatic manometry, EUS Dr. Ardis Hughs 09/18/07 dilated but otherwise normal extrahepatic duct, no masses  . Arthritis   . Bursitis   . Bursitis    shoulder and both hips  . CAD (coronary artery disease)   . Cervical ca ([place]) 1984  . Chronic abdominal pain    functional, seen @[facility] , extensive WU here benign   . 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 Complication of anesthesia    years ago woke up during surgery  . Constipation   . COPD (chronic obstructive pulmonary disease) ([place])   . Depression    w/ menopause  . Dysrhythmia    AFib  . GERD (gastroesophageal reflux disease)   . H/O hiatal hernia   . Headache    hx of migraines in past  . Heart murmur   . Hemorrhoids   . HTN (hypertension)   .  Hyperlipemia   . Hypothyroidism   . MI (myocardial infarction) ([place]) 2000  . Pelvic pain in female 05/24/2015  . 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 PUD (peptic ulcer disease) 1997/1998   negative h pylori  . S/P colonoscopy 09/10/07   Dr. Donald Pore sec to tortuous colon, followed by [facility] & flex sig normal  . Shortness of breath    with exertion  . Skin cancer    nose  . Stroke ([place])    1983  . Tachycardia    being evaluated recently by Dr. Rollene Fare  . TIA (transient ischemic attack) 1983    Past Surgical History:  Procedure Laterality Date  . ABDOMINAL HYSTERECTOMY    . APPENDECTOMY    . arthroscopic knee    . CATARACT EXTRACTION W/PHACO Right 09/02/2014   Procedure: CATARACT EXTRACTION PHACO AND INTRAOCULAR LENS PLACEMENT RIGHT EYE;  Surgeon: Tonny Branch, MD;  Location: [facility];  Service: Ophthalmology;  Laterality: Right;  CDE:8.96  . CATARACT EXTRACTION W/PHACO Left 09/13/2014   Procedure: CATARACT EXTRACTION PHACO AND INTRAOCULAR LENS PLACEMENT LEFT EYE;  Surgeon: Tonny Branch, MD;  Location: [facility];  Service: Ophthalmology;  Laterality: Left;  CDE 6.20  . CHOLECYSTECTOMY    . INGUINAL HERNIA REPAIR     left  . JOINT REPLACEMENT Bilateral    knees  . PARTIAL HYSTERECTOMY    . right arm     nerve  . TONSILLECTOMY    .  TONSILLECTOMY    . TOTAL KNEE ARTHROPLASTY  06/20/2012   Procedure: TOTAL KNEE ARTHROPLASTY;  Surgeon: Yvette Rack., MD;  Location: [place];  Service: Orthopedics;  Laterality: Right;  . TOTAL KNEE ARTHROPLASTY Left 04/02/2014   Procedure: TOTAL KNEE ARTHROPLASTY;  Surgeon: Yvette Rack., MD;  Location: [place];  Service: Orthopedics;  Laterality: Left;    Family History  Problem Relation Age of Onset  . Ulcers Brother   . Heart attack Brother   . Ulcers Sister   . Thyroid disease Daughter   . Hypertension Daughter   . Other Daughter        nervous  . Hypertension Sister   . Heart disease Sister   . Thyroid disease Sister    . Hypertension Sister   . Heart disease Sister   . Thyroid disease Sister   . Heart attack Brother   . Cancer Brother        prostate    SOCIAL HISTORY: Social History   Socioeconomic History  . Marital status: Married    Spouse name: Not on file  . Number of children: 1  . Years of education: Not on file  . Highest education level: Not on file  Occupational History  . Occupation: Training and development officer: [employer]  Tobacco Use  . Smoking status: Former Smoker    Packs/day: 1.00    Years: 22.00    Pack years: 22.00    Types: Cigarettes    Start date: 08/06/1981    Quit date: 01/05/2004    Years since quitting: 15.7  . Smokeless tobacco: Never Used  . Tobacco comment: Quit in 2005  Substance and Sexual Activity  . Alcohol use: No    Alcohol/week: 0.0 standard drinks  . Drug use: No  . Sexual activity: Never    Birth control/protection: Surgical    Comment: hyst  Other Topics Concern  . Not on file  Social History Narrative  . Not on file   Social Determinants of Health   Financial Resource Strain:   . Difficulty of Paying Living Expenses: Not on file  Food Insecurity:   . Worried About Charity fundraiser in the Last Year: Not on file  . Ran Out of Food in the Last Year: Not on file  Transportation Needs:   . Lack of Transportation (Medical): Not on file  . Lack of Transportation (Non-Medical): Not on file  Physical Activity:   . Days of Exercise per Week: Not on file  . Minutes of Exercise per Session: Not on file  Stress:   . Feeling of Stress : Not on file  Social Connections:   . Frequency of Communication with Friends and Family: Not on file  . Frequency of Social Gatherings with Friends and Family: Not on file  . Attends Religious Services: Not on file  . Active Member of Clubs or Organizations: Not on file  . Attends Archivist Meetings: Not on file  . Marital Status: Not on file  Intimate Partner Violence:   . Fear of Current or  Ex-Partner: Not on file  . Emotionally Abused: Not on file  . Physically Abused: Not on file  . Sexually Abused: Not on file    Allergies  Allergen Reactions  . Iohexol Shortness Of Breath and Swelling     Desc: Pt states she had Cardia Cath around 2000/ experienced SOB and swelling of upper body during cath procedure/ tsf   .  Nubain [Nalbuphine Hcl] Shortness Of Breath and Other (See Comments)    ??  DOSE RELATED  ??  . Penicillins Itching, Rash and Other (See Comments)    PATIENT HAS HAD A PCN REACTION WITH IMMEDIATE RASH, FACIAL/TONGUE/THROAT SWELLING, SOB, OR LIGHTHEADEDNESS WITH HYPOTENSION:  #  #  #  YES  #  #  #   Has patient had a PCN reaction causing severe rash involving mucus membranes or skin necrosis: No Has patient had a PCN reaction that required hospitalization No Has patient had a PCN reaction occurring within the last 10 years: No If all of the above answers are "NO", then may proceed with Cephalosporin use.   . Iodinated Diagnostic Agents Swelling  . Latex Rash    Current Outpatient Medications  Medication Sig Dispense Refill  . albuterol (PROVENTIL HFA;VENTOLIN HFA) 108 (90 Base) MCG/ACT inhaler Inhale 2 puffs into the lungs every 4 (four) hours as needed for wheezing or shortness of breath. 1 Inhaler 0  . aspirin EC 81 MG tablet Take 1 tablet (81 mg total) by mouth daily. 90 tablet 3  . Biotin (BIOTIN 5000) 5 MG CAPS Take 5 mg by mouth daily.    . chlorthalidone (HYGROTON) 25 MG tablet Take 25 mg by mouth daily.    . clobetasol (TEMOVATE) 0.05 % external solution Apply 1 application topically 2 (two) times daily as needed (psoriasis on scalp).   3  . cyclobenzaprine (FLEXERIL) 10 MG tablet Take 10 mg by mouth 3 (three) times daily as needed for muscle spasms.     Marland Kitchen desonide (DESOWEN) 0.05 % cream Apply 1 application topically 2 (two) times daily as needed (for psoriasis on skin).   3  . diclofenac sodium (VOLTAREN) 1 % GEL Apply 2 g topically daily as needed (for  knee pain/arthritis).     Marland Kitchen ELDERBERRY PO Take 2 tablets by mouth daily.    . Ginkgo Biloba Extract (GNP GINGKO BILOBA EXTRACT) 60 MG CAPS Take 120 mg by mouth daily.    Marland Kitchen levothyroxine (SYNTHROID) 137 MCG tablet Take 137 mcg by mouth daily before breakfast.    . losartan (COZAAR) 100 MG tablet Take 100 mg by mouth daily.    . metoprolol tartrate (LOPRESSOR) 25 MG tablet Take 25 mg by mouth 2 (two) times daily.    . metoprolol tartrate (LOPRESSOR) 50 MG tablet Take 1 tablet (50 mg total) by mouth 2 (two) times daily. 180 tablet 3  . morphine (MS CONTIN) 30 MG 12 hr tablet Take 30 mg by mouth every 12 (twelve) hours.    Marland Kitchen morphine (MSIR) 15 MG tablet Take 30 mg by mouth 3 (three) times daily as needed for severe pain.     Marland Kitchen MOVANTIK 25 MG TABS tablet Take 25 mg by mouth daily.     . Multiple Vitamin (MULTIVITAMIN WITH MINERALS) TABS tablet Take 1 tablet by mouth daily.    . nitroGLYCERIN (NITROSTAT) 0.4 MG SL tablet Place 0.4 mg under the tongue every 5 (five) minutes as needed for chest pain.     . pantoprazole (PROTONIX) 40 MG tablet Take 1 tablet (40 mg total) by mouth 2 (two) times daily before a meal. 180 tablet 3  . simvastatin (ZOCOR) 20 MG tablet Take 20 mg by mouth at bedtime.     Marland Kitchen zinc gluconate 50 MG tablet Take 50 mg by mouth daily.    Marland Kitchen zolpidem (AMBIEN) 5 MG tablet Take 5 mg by mouth at bedtime as needed for sleep.  No current facility-administered medications for this visit.    REVIEW OF SYSTEMS:  [X]  denotes positive finding, [ ]  denotes negative finding Cardiac  Comments:  Chest pain or chest pressure:    Shortness of breath upon exertion:    Short of breath when lying flat:    Irregular heart rhythm:        Vascular    Pain in calf, thigh, or hip brought on by ambulation:    Pain in feet at night that wakes you up from your sleep:     Blood clot in your veins:    Leg swelling:         Pulmonary    Oxygen at home:    Productive cough:     Wheezing:           Neurologic    Sudden weakness in arms or legs:     Sudden numbness in arms or legs:     Sudden onset of difficulty speaking or slurred speech:    Temporary loss of vision in one eye:     Problems with dizziness:     Back pain x   Gastrointestinal    Blood in stool:     Vomited blood:         Genitourinary    Burning when urinating:     Blood in urine:        Psychiatric    Major depression:         Hematologic    Bleeding problems:    Problems with blood clotting too easily:        Skin    Rashes or ulcers:        Constitutional    Fever or chills:      PHYSICAL EXAM: Vitals:   09/29/19 1059  BP: (!) 145/96  Pulse: 99  Resp: 16  Temp: 97.9 F (36.6 C)  TempSrc: Temporal  SpO2: 93%  Weight: 215 lb (97.5 kg)  Height: 5\' 5"  (1.651 m)    GENERAL: The patient is a well-nourished female, in no acute distress. The vital signs are documented above. CARDIAC: There is a regular rate and rhythm.  VASCULAR:  1+ femoral pulses palpable bilaterally. No palpable pedal pulses. No lower extremity tissue loss. PULMONARY: There is good air exchange bilaterally without wheezing or rales. ABDOMEN: Soft and non-tender with normal pitched bowel sounds. Obese.  Laparoscopic incision at umbilicus. MUSCULOSKELETAL: There are no major deformities or cyanosis. NEUROLOGIC: No focal weakness or paresthesias are detected. SKIN: There are no ulcers or rashes noted. PSYCHIATRIC: The patient has a normal affect.  DATA:   I independently reviewed her MRI from 01/24/2019 and the aortic bifurcation appears to be at L4 with the iliac vein bifurcation slightly lower at L4-L5 disc space.  There is a plane between the left iliac vein and the anterior spine.  Assessment/Plan:  69 year old female with multiple medical comorbidities as listed above that presents for preoperative evaluation prior to planned L5-S1 ALIF with Dr. Saintclair Halsted.  After review of her MRI as well as examining her, I think should  be a very good candidate for anterior approach.  I discussed the steps of surgery in detail including transverse incision over the left rectus muscle with mobilization of the peritoneum/intestines as well as the left ureter and mobilization of left iliac artery and vein if necessary.  We discussed risks and benefits including injury to the above structures.  I look forward helping Dr. Saintclair Halsted.  All questions answered and  discussed with her husband as well.   [name], MD [facility] Office: [phone]

## 2019-09-29 NOTE — Telephone Encounter (Signed)
Attempted to reach, phone went direct to voicemail, asked her to return my call.

## 2019-09-30 NOTE — Progress Notes (Signed)
[id number] - [place], [place] [address] [place] [address] Phone: [phone] Fax: [phone]      Your procedure is scheduled on Monday, October 05, 2019.  Report to [facility] Main Entrance "A" at 5:30 A.M., and check in at the Admitting office.  Call this number if you have problems the morning of surgery:  [phone]  Call [phone] if you have any questions prior to your surgery date Monday-Friday 8am-4pm    Remember:  Do not eat or drink after midnight the night before your surgery    Take these medicines the morning of surgery with A SIP OF WATER: levothyroxine (SYNTHROID) metoprolol tartrate (LOPRESSOR) morphine (MS CONTIN) pantoprazole (PROTONIX) albuterol (PROVENTIL HFA;VENTOLIN HFA) 108 - if needed cyclobenzaprine (FLEXERIL) - if needed nitroGLYCERIN (NITROSTAT)  - if needed  7 days prior to surgery STOP taking any Aspirin (unless otherwise instructed by your surgeon), Aleve, Naproxen, Ibuprofen, Motrin, Advil, Goody's, BC's, all herbal medications, fish oil, and all vitamins.    The Morning of Surgery  Do not wear jewelry, make-up or nail polish.  Do not wear lotions, powders, perfumes, or deodorant  Do not shave 48 hours prior to surgery.  Do not bring valuables to the hospital.  [facility] is not responsible for any belongings or valuables.  If you are a smoker, DO NOT Smoke 24 hours prior to surgery  If you wear a CPAP at night please bring your mask the morning of surgery   Remember that you must have someone to transport you home after your surgery, and remain with you for 24 hours if you are discharged the same day.   Please bring cases for contacts, glasses, hearing aids, dentures or bridgework because it cannot be worn into surgery.    Leave your suitcase in the car.  After surgery it may be brought to your room.  For patients admitted to the hospital, discharge time will be determined by  your treatment team.  Patients discharged the day of surgery will not be allowed to drive home.    Special instructions:   [facility]- Preparing For Surgery  Before surgery, you can play an important role. Because skin is not sterile, your skin needs to be as free of germs as possible. You can reduce the number of germs on your skin by washing with CHG (chlorahexidine gluconate) Soap before surgery.  CHG is an antiseptic cleaner which kills germs and bonds with the skin to continue killing germs even after washing.    Oral Hygiene is also important to reduce your risk of infection.  Remember - BRUSH YOUR TEETH THE MORNING OF SURGERY WITH YOUR REGULAR TOOTHPASTE  Please do not use if you have an allergy to CHG or antibacterial soaps. If your skin becomes reddened/irritated stop using the CHG.  Do not shave (including legs and underarms) for at least 48 hours prior to first CHG shower. It is OK to shave your face.  Please follow these instructions carefully.   1. Shower the NIGHT BEFORE SURGERY and the MORNING OF SURGERY with CHG Soap.   2. If you chose to wash your hair, wash your hair first as usual with your normal shampoo.  3. After you shampoo, rinse your hair and body thoroughly to remove the shampoo.  4. Use CHG as you would any other liquid soap. You can apply CHG directly to the skin and wash gently with a scrungie or a clean washcloth.  5. Apply the CHG Soap to your body ONLY FROM THE NECK DOWN.  Do not use on open wounds or open sores. Avoid contact with your eyes, ears, mouth and genitals (private parts). Wash Face and genitals (private parts)  with your normal soap.   6. Wash thoroughly, paying special attention to the area where your surgery will be performed.  7. Thoroughly rinse your body with warm water from the neck down.  8. DO NOT shower/wash with your normal soap after using and rinsing off the CHG Soap.  9. Pat yourself dry with a CLEAN TOWEL.  10. Wear CLEAN  PAJAMAS to bed the night before surgery, wear comfortable clothes the morning of surgery  11. Place CLEAN SHEETS on your bed the night of your first shower and DO NOT SLEEP WITH PETS.    Day of Surgery:  Please shower the morning of surgery with the CHG soap Do not apply any deodorants/lotions. Please wear clean clothes to the hospital/surgery center.   Remember to brush your teeth WITH YOUR REGULAR TOOTHPASTE.   Please read over the following fact sheets that you were given.

## 2019-10-01 ENCOUNTER — Other Ambulatory Visit: Payer: Self-pay

## 2019-10-01 ENCOUNTER — Encounter (HOSPITAL_COMMUNITY)
Admission: RE | Admit: 2019-10-01 | Discharge: 2019-10-01 | Disposition: A | Discharge status: Home / Self Care | Payer: BC Managed Care – PPO | Source: Ambulatory Visit | Attending: Neurosurgery | Admitting: Neurosurgery

## 2019-10-01 ENCOUNTER — Encounter (HOSPITAL_COMMUNITY): Payer: Self-pay

## 2019-10-01 ENCOUNTER — Other Ambulatory Visit (HOSPITAL_COMMUNITY)
Admission: RE | Admit: 2019-10-01 | Discharge: 2019-10-01 | Disposition: A | Discharge status: Home / Self Care | Payer: BC Managed Care – PPO | Source: Ambulatory Visit | Attending: Neurosurgery | Admitting: Neurosurgery

## 2019-10-01 DIAGNOSIS — I10 Essential (primary) hypertension: Secondary | ICD-10-CM | POA: Diagnosis not present

## 2019-10-01 DIAGNOSIS — Z01818 Encounter for other preprocedural examination: Secondary | ICD-10-CM | POA: Insufficient documentation

## 2019-10-01 DIAGNOSIS — Z20822 Contact with and (suspected) exposure to covid-19: Secondary | ICD-10-CM | POA: Diagnosis not present

## 2019-10-01 DIAGNOSIS — Z01812 Encounter for preprocedural laboratory examination: Secondary | ICD-10-CM | POA: Diagnosis not present

## 2019-10-01 HISTORY — DX: Unspecified asthma, uncomplicated: J45.909

## 2019-10-01 HISTORY — DX: Pneumonia, unspecified organism: J18.9

## 2019-10-01 LAB — BASIC METABOLIC PANEL
Anion gap: 14 (ref 5–15)
BUN: 19 mg/dL (ref 8–23)
CO2: 23 mmol/L (ref 22–32)
Calcium: 10.2 mg/dL (ref 8.9–10.3)
Chloride: 103 mmol/L (ref 98–111)
Creatinine, Ser: 0.82 mg/dL (ref 0.44–1.00)
GFR calc Af Amer: >60 mL/min (ref >60)
GFR calc non Af Amer: >60 mL/min (ref >60)
Glucose, Bld: 133 mg/dL — ABNORMAL HIGH (ref 70–99)
Potassium: 3.7 mmol/L (ref 3.5–5.1)
Sodium: 140 mmol/L (ref 135–145)

## 2019-10-01 LAB — SURGICAL PCR SCREEN
MRSA, PCR: NEGATIVE
Staphylococcus aureus: NEGATIVE

## 2019-10-01 LAB — CBC
HCT: 47.4 % — ABNORMAL HIGH (ref 36.0–46.0)
Hemoglobin: 14.8 g/dL (ref 12.0–15.0)
MCH: 27.2 pg (ref 26.0–34.0)
MCHC: 31.2 g/dL (ref 30.0–36.0)
MCV: 87 fL (ref 80.0–100.0)
Platelets: 321 10*3/uL (ref 150–400)
RBC: 5.45 MIL/uL — ABNORMAL HIGH (ref 3.87–5.11)
RDW: 14.2 % (ref 11.5–15.5)
WBC: 8.1 10*3/uL (ref 4.0–10.5)
nRBC: 0 % (ref 0.0–0.2)

## 2019-10-01 LAB — SARS CORONAVIRUS 2 (TAT 6-24 HRS): SARS Coronavirus 2: NEGATIVE

## 2019-10-01 LAB — TYPE AND SCREEN
ABO/RH(D): B POS
Antibody Screen: NEGATIVE

## 2019-10-01 NOTE — Progress Notes (Signed)
PCP - Asencion Noble, MD Cardiologist - Kate Sable, MD  PPM/ICD - Denies  Chest x-ray - N/A EKG - 05/06/19 Stress Test - 07/18/17 ECHO - 07/18/17 Cardiac Cath - 03/21/04  Sleep Study - Yes, per patient negative for OSA.  Patient denies being a diabetic.  Blood Thinner Instructions: N/A Aspirin Instructions: Per patient, she stopped ASA 09/21/19  ERAS Protcol - N/A PRE-SURGERY Ensure or G2- N/A  COVID TEST- 10/01/19   Anesthesia review: Yes, Cardia hx.  Patient denies shortness of breath, fever, cough and chest pain at PAT appointment   All instructions explained to the patient, with a verbal understanding of the material. Patient agrees to go over the instructions while at home for a better understanding. Patient also instructed to self quarantine after being tested for COVID-19. The opportunity to ask questions was provided.

## 2019-10-02 NOTE — Anesthesia Preprocedure Evaluation (Addendum)
Anesthesia Evaluation  Patient identified by MRN, date of birth, ID band Patient awake    Reviewed: Allergy & Precautions, NPO status , Patient's Chart, lab work & pertinent test results  Airway Mallampati: II  TM Distance: >3 FB Neck ROM: Full    Dental  (+) Teeth Intact, Dental Advisory Given,    Pulmonary former smoker,    breath sounds clear to auscultation       Cardiovascular hypertension,  Rhythm:Regular Rate:Normal     Neuro/Psych    GI/Hepatic   Endo/Other    Renal/GU      Musculoskeletal   Abdominal   Peds  Hematology   Anesthesia Other Findings   Reproductive/Obstetrics                            Anesthesia Physical Anesthesia Plan  ASA: III  Anesthesia Plan: General   Post-op Pain Management:    Induction: Intravenous  PONV Risk Score and Plan: Ondansetron and Dexamethasone  Airway Management Planned: Oral ETT  Additional Equipment: Arterial line  Intra-op Plan:   Post-operative Plan:   Informed Consent: I have reviewed the patients History and Physical, chart, labs and discussed the procedure including the risks, benefits and alternatives for the proposed anesthesia with the patient or authorized representative who has indicated his/her understanding and acceptance.     Dental advisory given  Plan Discussed with: CRNA and Anesthesiologist  Anesthesia Plan Comments: (Pt follows with cardiology for hx of tachycardia, HTN, chest pain.  Remote Cath in 2005 showed normal coronaries. She underwent a nuclear stress test 07/18/2017 with findings suggestive of very mild apical ischemia versus differences in apical thinning.  It was deemed low risk from a perfusion standpoint but high risk based on decreased LVEF and elevated tid 1.49.  LVEF calculated at 32%. She then had an echo 07/18/2017 which demonstrated normal left ventricular systolic function, LVEF 55 to 60%.   There was grade 1 diastolic dysfunction and mild aortic regurgitation.he has a history of leg swelling due to venous insufficiency and has been evaluated by vascular surgery with no surgical interventions recommended.  Pt was last seen by Dr. Bronson Ing 04/09/19 for preop clearance. Per note, "She presents for Preoperative risk stratification as she needs back surgery again.  She underwent back surgery in January 2019 by Dr. Saintclair Halsted. She has episodic chest pains at rest.  She has chest pressure when she is very stressed or anxious.  She does have periodic palpitations.  She has bilateral venous varicosities and occasional swelling with that.  She wears compression stockings when it is not hot outside/. Preoperative risk stratification: She had no cardiac issues with prior back surgery in January 2019.  She underwent a low risk nuclear stress test as per myocardial perfusion in 2018 and has normal LV systolic function echocardiogram at that time.  No further cardiac testing is indicated.  She can proceed with surgery with acceptable level of risk." She was advised to followup with cardiology PRN  Preop labs reviewed, unremarkable.   ECG 04/09/19: Sinus tachycardia with PVCs, 112 bpm.  TTE 07/18/17: - Left ventricle: The cavity size was normal. Wall thickness was  increased in a pattern of moderate LVH. Systolic function was  normal. The estimated ejection fraction was in the range of 55%  to 60%. Wall motion was normal; there were no regional wall  motion abnormalities. Doppler parameters are consistent with  abnormal left ventricular relaxation (grade 1 diastolic  dysfunction).  -  Aortic valve: There was mild regurgitation. Valve area (VTI):  1.92 cm^2. Valve area (Vmax): 1.91 cm^2. Valve area (Vmean): 1.78  cm^2.  - Technically adequate study.   Nuclear stress 07/18/17: There was no ST segment deviation noted during stress. Findings consistent with very mild apical ischemia verse  differences in apical thinning. Either finding alone is low risk from a perfusion standpoint. This is a high risk study. High risk based on decreased LVEF and elevated TID of 1.49. There is no evidence of significant ischemia by perfusion imaging itself, however with decreased LVEF and elevated TID there may be balanced ischemia. Recommend correlating LVEF with echo. The left ventricular ejection fraction is moderately decreased (30-44%). LVEF is 32% )       Anesthesia Quick Evaluation

## 2019-10-02 NOTE — Progress Notes (Signed)
Anesthesia Chart Review:  Pt follows with cardiology for hx of tachycardia, HTN, chest pain.  Remote Cath in 2005 showed normal coronaries. She underwent a nuclear stress test 07/18/2017 with findings suggestive of very mild apical ischemia versus differences in apical thinning.  It was deemed low risk from a perfusion standpoint but high risk based on decreased LVEF and elevated tid 1.49.  LVEF calculated at 32%. She then had an echo 07/18/2017 which demonstrated normal left ventricular systolic function, LVEF 55 to 60%.  There was grade 1 diastolic dysfunction and mild aortic regurgitation.he has a history of leg swelling due to venous insufficiency and has been evaluated by vascular surgery with no surgical interventions recommended.  Pt was last seen by Dr. Bronson Ing 04/09/19 for preop clearance. Per note, "She presents for Preoperative risk stratification as she needs back surgery again.  She underwent back surgery in January 2019 by Dr. Saintclair Halsted. She has episodic chest pains at rest.  She has chest pressure when she is very stressed or anxious.  She does have periodic palpitations.  She has bilateral venous varicosities and occasional swelling with that.  She wears compression stockings when it is not hot outside/. Preoperative risk stratification: She had no cardiac issues with prior back surgery in January 2019.  She underwent a low risk nuclear stress test as per myocardial perfusion in 2018 and has normal LV systolic function echocardiogram at that time.  No further cardiac testing is indicated.  She can proceed with surgery with acceptable level of risk." She was advised to followup with cardiology PRN  Preop labs reviewed, unremarkable.   ECG 04/09/19: Sinus tachycardia with PVCs, 112 bpm.  TTE 07/18/17: - Left ventricle: The cavity size was normal. Wall thickness was  increased in a pattern of moderate LVH. Systolic function was  normal. The estimated ejection fraction was in the range of 55%   to 60%. Wall motion was normal; there were no regional wall  motion abnormalities. Doppler parameters are consistent with  abnormal left ventricular relaxation (grade 1 diastolic  dysfunction).  - Aortic valve: There was mild regurgitation. Valve area (VTI):  1.92 cm^2. Valve area (Vmax): 1.91 cm^2. Valve area (Vmean): 1.78  cm^2.  - Technically adequate study.   Nuclear stress 07/18/17:  There was no ST segment deviation noted during stress.  Findings consistent with very mild apical ischemia verse differences in apical thinning. Either finding alone is low risk from a perfusion standpoint.  This is a high risk study. High risk based on decreased LVEF and elevated TID of 1.49. There is no evidence of significant ischemia by perfusion imaging itself, however with decreased LVEF and elevated TID there may be balanced ischemia. Recommend correlating LVEF with echo.  The left ventricular ejection fraction is moderately decreased (30-44%). LVEF is 32%  Wynonia Musty [facility] Short Stay Center/Anesthesiology Phone [phone] 10/02/2019 8:42 AM

## 2019-10-05 ENCOUNTER — Inpatient Hospital Stay (HOSPITAL_COMMUNITY): Payer: BC Managed Care – PPO

## 2019-10-05 ENCOUNTER — Encounter (HOSPITAL_COMMUNITY)
Admission: RE | Disposition: A | Discharge status: Home / Self Care | Payer: Self-pay | Source: Home / Self Care | Attending: Neurosurgery

## 2019-10-05 ENCOUNTER — Encounter (HOSPITAL_COMMUNITY): Payer: Self-pay | Admitting: Neurosurgery

## 2019-10-05 ENCOUNTER — Inpatient Hospital Stay (HOSPITAL_COMMUNITY): Payer: BC Managed Care – PPO | Admitting: Anesthesiology

## 2019-10-05 ENCOUNTER — Inpatient Hospital Stay (HOSPITAL_COMMUNITY): Payer: BC Managed Care – PPO | Admitting: Physician Assistant

## 2019-10-05 ENCOUNTER — Other Ambulatory Visit: Payer: Self-pay

## 2019-10-05 ENCOUNTER — Inpatient Hospital Stay (HOSPITAL_COMMUNITY)
Admission: RE | Admit: 2019-10-05 | Discharge: 2019-10-06 | DRG: 460 | Disposition: A | Discharge status: Home / Self Care | Payer: BC Managed Care – PPO | Attending: Neurosurgery | Admitting: Neurosurgery

## 2019-10-05 DIAGNOSIS — Z419 Encounter for procedure for purposes other than remedying health state, unspecified: Secondary | ICD-10-CM

## 2019-10-05 DIAGNOSIS — Z8711 Personal history of peptic ulcer disease: Secondary | ICD-10-CM | POA: Diagnosis not present

## 2019-10-05 DIAGNOSIS — E669 Obesity, unspecified: Secondary | ICD-10-CM | POA: Diagnosis present

## 2019-10-05 DIAGNOSIS — Z8673 Personal history of transient ischemic attack (TIA), and cerebral infarction without residual deficits: Secondary | ICD-10-CM

## 2019-10-05 DIAGNOSIS — G8929 Other chronic pain: Secondary | ICD-10-CM | POA: Diagnosis present

## 2019-10-05 DIAGNOSIS — Z91041 Radiographic dye allergy status: Secondary | ICD-10-CM

## 2019-10-05 DIAGNOSIS — M2578 Osteophyte, vertebrae: Secondary | ICD-10-CM | POA: Diagnosis present

## 2019-10-05 DIAGNOSIS — M5117 Intervertebral disc disorders with radiculopathy, lumbosacral region: Secondary | ICD-10-CM | POA: Diagnosis present

## 2019-10-05 DIAGNOSIS — M4317 Spondylolisthesis, lumbosacral region: Secondary | ICD-10-CM | POA: Diagnosis present

## 2019-10-05 DIAGNOSIS — E785 Hyperlipidemia, unspecified: Secondary | ICD-10-CM | POA: Diagnosis present

## 2019-10-05 DIAGNOSIS — Z87891 Personal history of nicotine dependence: Secondary | ICD-10-CM | POA: Diagnosis not present

## 2019-10-05 DIAGNOSIS — I10 Essential (primary) hypertension: Secondary | ICD-10-CM | POA: Diagnosis present

## 2019-10-05 DIAGNOSIS — Z79899 Other long term (current) drug therapy: Secondary | ICD-10-CM | POA: Diagnosis not present

## 2019-10-05 DIAGNOSIS — Z79890 Hormone replacement therapy (postmenopausal): Secondary | ICD-10-CM

## 2019-10-05 DIAGNOSIS — I2510 Atherosclerotic heart disease of native coronary artery without angina pectoris: Secondary | ICD-10-CM | POA: Diagnosis present

## 2019-10-05 DIAGNOSIS — Z880 Allergy status to penicillin: Secondary | ICD-10-CM

## 2019-10-05 DIAGNOSIS — Z6835 Body mass index (BMI) 35.0-35.9, adult: Secondary | ICD-10-CM

## 2019-10-05 DIAGNOSIS — Z85828 Personal history of other malignant neoplasm of skin: Secondary | ICD-10-CM

## 2019-10-05 DIAGNOSIS — M1990 Unspecified osteoarthritis, unspecified site: Secondary | ICD-10-CM | POA: Diagnosis present

## 2019-10-05 DIAGNOSIS — E039 Hypothyroidism, unspecified: Secondary | ICD-10-CM | POA: Diagnosis present

## 2019-10-05 DIAGNOSIS — Z8249 Family history of ischemic heart disease and other diseases of the circulatory system: Secondary | ICD-10-CM

## 2019-10-05 DIAGNOSIS — Z7982 Long term (current) use of aspirin: Secondary | ICD-10-CM | POA: Diagnosis not present

## 2019-10-05 DIAGNOSIS — Z8701 Personal history of pneumonia (recurrent): Secondary | ICD-10-CM

## 2019-10-05 DIAGNOSIS — I252 Old myocardial infarction: Secondary | ICD-10-CM

## 2019-10-05 DIAGNOSIS — K219 Gastro-esophageal reflux disease without esophagitis: Secondary | ICD-10-CM | POA: Diagnosis present

## 2019-10-05 DIAGNOSIS — Z981 Arthrodesis status: Secondary | ICD-10-CM | POA: Diagnosis not present

## 2019-10-05 DIAGNOSIS — M532X7 Spinal instabilities, lumbosacral region: Secondary | ICD-10-CM | POA: Diagnosis not present

## 2019-10-05 DIAGNOSIS — M5137 Other intervertebral disc degeneration, lumbosacral region: Secondary | ICD-10-CM | POA: Diagnosis not present

## 2019-10-05 DIAGNOSIS — I4891 Unspecified atrial fibrillation: Secondary | ICD-10-CM | POA: Diagnosis present

## 2019-10-05 DIAGNOSIS — J449 Chronic obstructive pulmonary disease, unspecified: Secondary | ICD-10-CM | POA: Diagnosis present

## 2019-10-05 DIAGNOSIS — Z91040 Latex allergy status: Secondary | ICD-10-CM

## 2019-10-05 DIAGNOSIS — Z8541 Personal history of malignant neoplasm of cervix uteri: Secondary | ICD-10-CM | POA: Diagnosis not present

## 2019-10-05 DIAGNOSIS — Z888 Allergy status to other drugs, medicaments and biological substances status: Secondary | ICD-10-CM

## 2019-10-05 HISTORY — PX: ABDOMINAL EXPOSURE: SHX5708

## 2019-10-05 HISTORY — PX: ANTERIOR LUMBAR FUSION: SHX1170

## 2019-10-05 SURGERY — ANTERIOR LUMBAR FUSION 1 LEVEL
Anesthesia: General | Site: Spine Lumbar

## 2019-10-05 MED ORDER — DEXAMETHASONE SODIUM PHOSPHATE 10 MG/ML IJ SOLN
INTRAMUSCULAR | Status: AC
  Filled 2019-10-05: qty 1

## 2019-10-05 MED ORDER — THROMBIN 20000 UNITS EX SOLR
CUTANEOUS | Status: AC
  Filled 2019-10-05: qty 20000

## 2019-10-05 MED ORDER — CEFAZOLIN SODIUM-DEXTROSE 2-4 GM/100ML-% IV SOLN: 2.0000 g | Freq: Three times a day (TID) | INTRAVENOUS | Status: DC

## 2019-10-05 MED ORDER — ONDANSETRON HCL 4 MG PO TABS: 4.0000 mg | ORAL_TABLET | Freq: Four times a day (QID) | ORAL | Status: DC | PRN

## 2019-10-05 MED ORDER — PANTOPRAZOLE SODIUM 40 MG PO TBEC
40.0000 mg | DELAYED_RELEASE_TABLET | Freq: Two times a day (BID) | ORAL | Status: DC
  Administered 2019-10-05 – 2019-10-06 (×2): 40 mg via ORAL
  Filled 2019-10-05 (×2): qty 1

## 2019-10-05 MED ORDER — CYCLOBENZAPRINE HCL 10 MG PO TABS: 10.0000 mg | ORAL_TABLET | Freq: Three times a day (TID) | ORAL | Status: DC | PRN

## 2019-10-05 MED ORDER — ZINC GLUCONATE 50 MG PO TABS: 50.0000 mg | ORAL_TABLET | Freq: Every day | ORAL | Status: DC

## 2019-10-05 MED ORDER — ACETAMINOPHEN 650 MG RE SUPP: 650.0000 mg | RECTAL | Status: DC | PRN

## 2019-10-05 MED ORDER — HYDROCORTISONE 1 % EX CREA
TOPICAL_CREAM | Freq: Two times a day (BID) | CUTANEOUS | Status: DC
  Filled 2019-10-05: qty 28

## 2019-10-05 MED ORDER — LOSARTAN POTASSIUM 50 MG PO TABS
100.0000 mg | ORAL_TABLET | Freq: Every day | ORAL | Status: DC
  Administered 2019-10-06: 100 mg via ORAL
  Filled 2019-10-05: qty 2

## 2019-10-05 MED ORDER — ACETAMINOPHEN 325 MG PO TABS: 650.0000 mg | ORAL_TABLET | ORAL | Status: DC | PRN

## 2019-10-05 MED ORDER — CYCLOBENZAPRINE HCL 10 MG PO TABS
10.0000 mg | ORAL_TABLET | Freq: Three times a day (TID) | ORAL | Status: DC | PRN
  Administered 2019-10-05 – 2019-10-06 (×2): 10 mg via ORAL
  Filled 2019-10-05 (×2): qty 1

## 2019-10-05 MED ORDER — SUGAMMADEX SODIUM 200 MG/2ML IV SOLN
INTRAVENOUS | Status: DC | PRN
  Administered 2019-10-05: 200 mg via INTRAVENOUS

## 2019-10-05 MED ORDER — SODIUM CHLORIDE 0.9 % IV SOLN: 250.0000 mL | INTRAVENOUS | Status: DC

## 2019-10-05 MED ORDER — THROMBIN 5000 UNITS EX SOLR
OROMUCOSAL | Status: DC | PRN
  Administered 2019-10-05: 5 mL
  Administered 2019-10-05: 5 mL via TOPICAL

## 2019-10-05 MED ORDER — PROPOFOL 10 MG/ML IV BOLUS
INTRAVENOUS | Status: DC | PRN
  Administered 2019-10-05: 120 mg via INTRAVENOUS

## 2019-10-05 MED ORDER — VANCOMYCIN HCL IN DEXTROSE 1-5 GM/200ML-% IV SOLN
1000.0000 mg | INTRAVENOUS | Status: AC
  Administered 2019-10-05: 1000 mg via INTRAVENOUS
  Filled 2019-10-05: qty 200

## 2019-10-05 MED ORDER — SODIUM CHLORIDE 0.9% FLUSH: 3.0000 mL | INTRAVENOUS | Status: DC | PRN

## 2019-10-05 MED ORDER — PHENYLEPHRINE HCL-NACL 10-0.9 MG/250ML-% IV SOLN
INTRAVENOUS | Status: DC | PRN
  Administered 2019-10-05: 20 ug/min via INTRAVENOUS

## 2019-10-05 MED ORDER — 0.9 % SODIUM CHLORIDE (POUR BTL) OPTIME
TOPICAL | Status: DC | PRN
  Administered 2019-10-05 (×3): 1000 mL

## 2019-10-05 MED ORDER — METOPROLOL TARTRATE 25 MG PO TABS: 50.0000 mg | ORAL_TABLET | Freq: Two times a day (BID) | ORAL | Status: DC

## 2019-10-05 MED ORDER — THROMBIN 5000 UNITS EX SOLR
CUTANEOUS | Status: AC
  Filled 2019-10-05: qty 5000

## 2019-10-05 MED ORDER — METOPROLOL TARTRATE 5 MG/5ML IV SOLN
INTRAVENOUS | Status: DC | PRN
  Administered 2019-10-05 (×5): 1 mg via INTRAVENOUS

## 2019-10-05 MED ORDER — FENTANYL CITRATE (PF) 250 MCG/5ML IJ SOLN
INTRAMUSCULAR | Status: AC
  Filled 2019-10-05: qty 5

## 2019-10-05 MED ORDER — CLOBETASOL PROPIONATE 0.05 % EX SOLN: 1.0000 "application " | Freq: Two times a day (BID) | CUTANEOUS | Status: DC | PRN

## 2019-10-05 MED ORDER — PHENYLEPHRINE 40 MCG/ML (10ML) SYRINGE FOR IV PUSH (FOR BLOOD PRESSURE SUPPORT)
PREFILLED_SYRINGE | INTRAVENOUS | Status: AC
  Filled 2019-10-05: qty 10

## 2019-10-05 MED ORDER — PANTOPRAZOLE SODIUM 40 MG IV SOLR: 40.0000 mg | Freq: Every day | INTRAVENOUS | Status: DC

## 2019-10-05 MED ORDER — LEVOTHYROXINE SODIUM 137 MCG PO TABS
137.0000 ug | ORAL_TABLET | Freq: Every day | ORAL | Status: DC
  Administered 2019-10-06: 137 ug via ORAL
  Filled 2019-10-05: qty 1

## 2019-10-05 MED ORDER — SODIUM CHLORIDE 0.9 % IV SOLN
INTRAVENOUS | Status: DC | PRN
  Administered 2019-10-05: 500 mL

## 2019-10-05 MED ORDER — ASPIRIN EC 81 MG PO TBEC
81.0000 mg | DELAYED_RELEASE_TABLET | Freq: Every day | ORAL | Status: DC
  Administered 2019-10-06: 81 mg via ORAL
  Filled 2019-10-05: qty 1

## 2019-10-05 MED ORDER — MIDAZOLAM HCL 2 MG/2ML IJ SOLN
INTRAMUSCULAR | Status: AC
  Filled 2019-10-05: qty 2

## 2019-10-05 MED ORDER — ONDANSETRON HCL 4 MG/2ML IJ SOLN: 4.0000 mg | Freq: Four times a day (QID) | INTRAMUSCULAR | Status: DC | PRN

## 2019-10-05 MED ORDER — ESMOLOL HCL 100 MG/10ML IV SOLN
INTRAVENOUS | Status: AC
  Filled 2019-10-05: qty 10

## 2019-10-05 MED ORDER — SIMVASTATIN 20 MG PO TABS
20.0000 mg | ORAL_TABLET | Freq: Every day | ORAL | Status: DC
  Administered 2019-10-05: 20 mg via ORAL
  Filled 2019-10-05: qty 1

## 2019-10-05 MED ORDER — VANCOMYCIN HCL 1000 MG IV SOLR
INTRAVENOUS | Status: DC | PRN
  Administered 2019-10-05: 08:00:00 1000 mg via INTRAVENOUS

## 2019-10-05 MED ORDER — CHLORHEXIDINE GLUCONATE 4 % EX LIQD: 60.0000 mL | Freq: Once | CUTANEOUS | Status: DC

## 2019-10-05 MED ORDER — NITROGLYCERIN 0.4 MG SL SUBL: 0.4000 mg | SUBLINGUAL_TABLET | SUBLINGUAL | Status: DC | PRN

## 2019-10-05 MED ORDER — ELDERBERRY 575 MG/5ML PO SYRP: ORAL_SOLUTION | Freq: Every day | ORAL | Status: DC

## 2019-10-05 MED ORDER — ONDANSETRON HCL 4 MG/2ML IJ SOLN
INTRAMUSCULAR | Status: DC | PRN
  Administered 2019-10-05: 4 mg via INTRAVENOUS

## 2019-10-05 MED ORDER — ROCURONIUM BROMIDE 10 MG/ML (PF) SYRINGE
PREFILLED_SYRINGE | INTRAVENOUS | Status: AC
  Filled 2019-10-05: qty 10

## 2019-10-05 MED ORDER — PHENYLEPHRINE 40 MCG/ML (10ML) SYRINGE FOR IV PUSH (FOR BLOOD PRESSURE SUPPORT)
PREFILLED_SYRINGE | INTRAVENOUS | Status: DC | PRN
  Administered 2019-10-05 (×3): 80 ug via INTRAVENOUS

## 2019-10-05 MED ORDER — ALBUMIN HUMAN 5 % IV SOLN: INTRAVENOUS | Status: DC | PRN

## 2019-10-05 MED ORDER — ALUM & MAG HYDROXIDE-SIMETH 200-200-20 MG/5ML PO SUSP: 30.0000 mL | Freq: Four times a day (QID) | ORAL | Status: DC | PRN

## 2019-10-05 MED ORDER — LACTATED RINGERS IV SOLN: INTRAVENOUS | Status: DC

## 2019-10-05 MED ORDER — ACETAMINOPHEN 10 MG/ML IV SOLN
INTRAVENOUS | Status: DC | PRN
  Administered 2019-10-05: 1000 mg via INTRAVENOUS

## 2019-10-05 MED ORDER — THROMBIN 20000 UNITS EX SOLR
CUTANEOUS | Status: DC | PRN
  Administered 2019-10-05 (×2): 20 mL

## 2019-10-05 MED ORDER — HEMOSTATIC AGENTS (NO CHARGE) OPTIME
TOPICAL | Status: DC | PRN
  Administered 2019-10-05: 1

## 2019-10-05 MED ORDER — LACTATED RINGERS IV SOLN: INTRAVENOUS | Status: DC | PRN

## 2019-10-05 MED ORDER — ONDANSETRON HCL 4 MG/2ML IJ SOLN: 4.0000 mg | Freq: Once | INTRAMUSCULAR | Status: DC | PRN

## 2019-10-05 MED ORDER — MENTHOL 3 MG MT LOZG: 1.0000 | LOZENGE | OROMUCOSAL | Status: DC | PRN

## 2019-10-05 MED ORDER — LIDOCAINE 2% (20 MG/ML) 5 ML SYRINGE
INTRAMUSCULAR | Status: AC
  Filled 2019-10-05: qty 5

## 2019-10-05 MED ORDER — DEXAMETHASONE SODIUM PHOSPHATE 10 MG/ML IJ SOLN
INTRAMUSCULAR | Status: DC | PRN
  Administered 2019-10-05: 10 mg via INTRAVENOUS

## 2019-10-05 MED ORDER — PHENOL 1.4 % MT LIQD: 1.0000 | OROMUCOSAL | Status: DC | PRN

## 2019-10-05 MED ORDER — BIOTIN 5 MG PO CAPS: 5.0000 mg | ORAL_CAPSULE | Freq: Every day | ORAL | Status: DC

## 2019-10-05 MED ORDER — KETAMINE HCL 10 MG/ML IJ SOLN
INTRAMUSCULAR | Status: DC | PRN
  Administered 2019-10-05: 10 mg via INTRAVENOUS
  Administered 2019-10-05: 20 mg via INTRAVENOUS
  Administered 2019-10-05 (×2): 10 mg via INTRAVENOUS

## 2019-10-05 MED ORDER — ACETAMINOPHEN 10 MG/ML IV SOLN
INTRAVENOUS | Status: AC
  Filled 2019-10-05: qty 100

## 2019-10-05 MED ORDER — VANCOMYCIN HCL IN DEXTROSE 1-5 GM/200ML-% IV SOLN
1000.0000 mg | Freq: Once | INTRAVENOUS | Status: AC
  Administered 2019-10-05: 21:00:00 1000 mg via INTRAVENOUS
  Filled 2019-10-05: qty 200

## 2019-10-05 MED ORDER — SODIUM CHLORIDE 0.9 % IV SOLN: INTRAVENOUS | Status: DC | PRN

## 2019-10-05 MED ORDER — ESMOLOL HCL 100 MG/10ML IV SOLN
INTRAVENOUS | Status: DC | PRN
  Administered 2019-10-05: 20 mg via INTRAVENOUS

## 2019-10-05 MED ORDER — HYDROMORPHONE HCL 1 MG/ML IJ SOLN
0.5000 mg | INTRAMUSCULAR | Status: DC | PRN
  Administered 2019-10-05 – 2019-10-06 (×3): 0.5 mg via INTRAVENOUS
  Filled 2019-10-05 (×4): qty 0.5

## 2019-10-05 MED ORDER — ADULT MULTIVITAMIN W/MINERALS CH: 1.0000 | ORAL_TABLET | Freq: Every day | ORAL | Status: DC

## 2019-10-05 MED ORDER — METOPROLOL TARTRATE 25 MG PO TABS
25.0000 mg | ORAL_TABLET | Freq: Two times a day (BID) | ORAL | Status: DC
  Administered 2019-10-05 – 2019-10-06 (×2): 25 mg via ORAL
  Filled 2019-10-05 (×2): qty 1

## 2019-10-05 MED ORDER — CHLORHEXIDINE GLUCONATE CLOTH 2 % EX PADS: 6.0000 | MEDICATED_PAD | Freq: Once | CUTANEOUS | Status: DC

## 2019-10-05 MED ORDER — CHLORTHALIDONE 25 MG PO TABS
25.0000 mg | ORAL_TABLET | Freq: Every day | ORAL | Status: DC
  Administered 2019-10-06: 25 mg via ORAL
  Filled 2019-10-05: qty 1

## 2019-10-05 MED ORDER — ALBUTEROL SULFATE (2.5 MG/3ML) 0.083% IN NEBU: 3.0000 mL | INHALATION_SOLUTION | RESPIRATORY_TRACT | Status: DC | PRN

## 2019-10-05 MED ORDER — METOPROLOL TARTRATE 5 MG/5ML IV SOLN
INTRAVENOUS | Status: AC
  Filled 2019-10-05: qty 5

## 2019-10-05 MED ORDER — MORPHINE SULFATE ER 30 MG PO TBCR
30.0000 mg | EXTENDED_RELEASE_TABLET | Freq: Two times a day (BID) | ORAL | Status: DC
  Administered 2019-10-05 – 2019-10-06 (×2): 30 mg via ORAL
  Filled 2019-10-05 (×2): qty 1

## 2019-10-05 MED ORDER — SODIUM CHLORIDE 0.9% FLUSH
3.0000 mL | Freq: Two times a day (BID) | INTRAVENOUS | Status: DC
  Administered 2019-10-05: 3 mL via INTRAVENOUS

## 2019-10-05 MED ORDER — FENTANYL CITRATE (PF) 100 MCG/2ML IJ SOLN
INTRAMUSCULAR | Status: AC
  Filled 2019-10-05: qty 2

## 2019-10-05 MED ORDER — PROPOFOL 10 MG/ML IV BOLUS
INTRAVENOUS | Status: AC
  Filled 2019-10-05: qty 20

## 2019-10-05 MED ORDER — DEXAMETHASONE SODIUM PHOSPHATE 10 MG/ML IJ SOLN
10.0000 mg | Freq: Once | INTRAMUSCULAR | Status: DC
  Filled 2019-10-05: qty 1

## 2019-10-05 MED ORDER — KETAMINE HCL 50 MG/5ML IJ SOSY
PREFILLED_SYRINGE | INTRAMUSCULAR | Status: AC
  Filled 2019-10-05: qty 5

## 2019-10-05 MED ORDER — ROCURONIUM BROMIDE 10 MG/ML (PF) SYRINGE
PREFILLED_SYRINGE | INTRAVENOUS | Status: DC | PRN
  Administered 2019-10-05: 50 mg via INTRAVENOUS
  Administered 2019-10-05: 20 mg via INTRAVENOUS
  Administered 2019-10-05: 50 mg via INTRAVENOUS

## 2019-10-05 MED ORDER — MIDAZOLAM HCL 5 MG/5ML IJ SOLN
INTRAMUSCULAR | Status: DC | PRN
  Administered 2019-10-05 (×2): 2 mg via INTRAVENOUS

## 2019-10-05 MED ORDER — LIDOCAINE 2% (20 MG/ML) 5 ML SYRINGE
INTRAMUSCULAR | Status: DC | PRN
  Administered 2019-10-05: 40 mg via INTRAVENOUS

## 2019-10-05 MED ORDER — ONDANSETRON HCL 4 MG/2ML IJ SOLN
INTRAMUSCULAR | Status: AC
  Filled 2019-10-05: qty 2

## 2019-10-05 MED ORDER — FENTANYL CITRATE (PF) 250 MCG/5ML IJ SOLN
INTRAMUSCULAR | Status: DC | PRN
  Administered 2019-10-05: 100 ug via INTRAVENOUS
  Administered 2019-10-05 (×3): 50 ug via INTRAVENOUS

## 2019-10-05 MED ORDER — NALOXEGOL OXALATE 25 MG PO TABS
25.0000 mg | ORAL_TABLET | Freq: Every day | ORAL | Status: DC
  Administered 2019-10-05 – 2019-10-06 (×2): 25 mg via ORAL
  Filled 2019-10-05 (×2): qty 1

## 2019-10-05 MED ORDER — ZOLPIDEM TARTRATE 5 MG PO TABS: 5.0000 mg | ORAL_TABLET | Freq: Every evening | ORAL | Status: DC | PRN

## 2019-10-05 MED ORDER — FENTANYL CITRATE (PF) 100 MCG/2ML IJ SOLN
25.0000 ug | INTRAMUSCULAR | Status: DC | PRN
  Administered 2019-10-05 (×2): 50 ug via INTRAVENOUS

## 2019-10-05 MED ORDER — GINKGO BILOBA EXTRACT 60 MG PO CAPS: 120.0000 mg | ORAL_CAPSULE | Freq: Every day | ORAL | Status: DC

## 2019-10-05 MED ORDER — MORPHINE SULFATE 15 MG PO TABS
30.0000 mg | ORAL_TABLET | Freq: Three times a day (TID) | ORAL | Status: DC | PRN
  Administered 2019-10-05: 16:00:00 30 mg via ORAL
  Filled 2019-10-05: qty 2

## 2019-10-05 SURGICAL SUPPLY — 98 items
ADH SKN CLS APL DERMABOND .7 (GAUZE/BANDAGES/DRESSINGS) ×2
ANCH SPNL 27 LMBR MIS (Anchor) ×2 IMPLANT
ANCHOR LUMBAR 27 (Anchor) ×1 IMPLANT
ANCHOR LUMBAR 27MM (Anchor) ×1 IMPLANT
ANCHOR LUMBAR MIS 30 (Anchor) ×4 IMPLANT
APL SKNCLS STERI-STRIP NONHPOA (GAUZE/BANDAGES/DRESSINGS) ×2
APPLIER CLIP 11 MED OPEN (CLIP) ×8
APR CLP MED 11 20 MLT OPN (CLIP) ×4
BAG DECANTER FOR FLEXI CONT (MISCELLANEOUS) ×4 IMPLANT
BENZOIN TINCTURE PRP APPL 2/3 (GAUZE/BANDAGES/DRESSINGS) ×2 IMPLANT
BONE TRINITY ELITE 1.2CC SM (Bone Implant) ×2 IMPLANT
BUR MATCHSTICK NEURO 3.0 LAGG (BURR) IMPLANT
CANISTER SUCT 3000ML PPV (MISCELLANEOUS) ×4 IMPLANT
CARTRIDGE OIL MAESTRO DRILL (MISCELLANEOUS) ×2 IMPLANT
CLIP APPLIE 11 MED OPEN (CLIP) ×4 IMPLANT
CLIP LIGATING EXTRA MED SLVR (CLIP) ×4 IMPLANT
CLIP LIGATING EXTRA SM BLUE (MISCELLANEOUS) ×4 IMPLANT
CLOSURE WOUND 1/2 X4 (GAUZE/BANDAGES/DRESSINGS) ×1
COVER WAND RF STERILE (DRAPES) ×8 IMPLANT
DERMABOND ADVANCED (GAUZE/BANDAGES/DRESSINGS) ×2
DERMABOND ADVANCED .7 DNX12 (GAUZE/BANDAGES/DRESSINGS) ×2 IMPLANT
DIFFUSER DRILL AIR PNEUMATIC (MISCELLANEOUS) ×4 IMPLANT
DRAPE C-ARM 42X72 X-RAY (DRAPES) ×12 IMPLANT
DRAPE LAPAROTOMY 100X72X124 (DRAPES) ×4 IMPLANT
DRSG OPSITE POSTOP 4X6 (GAUZE/BANDAGES/DRESSINGS) ×2 IMPLANT
ELECT BLADE 4.0 EZ CLEAN MEGAD (MISCELLANEOUS) ×4
ELECT BLADE 6.5 EXT (BLADE) ×4 IMPLANT
ELECT REM PT RETURN 9FT ADLT (ELECTROSURGICAL) ×4
ELECTRODE BLDE 4.0 EZ CLN MEGD (MISCELLANEOUS) ×2 IMPLANT
ELECTRODE REM PT RTRN 9FT ADLT (ELECTROSURGICAL) ×2 IMPLANT
GAUZE 4X4 16PLY RFD (DISPOSABLE) IMPLANT
GAUZE SPONGE 4X4 12PLY STRL (GAUZE/BANDAGES/DRESSINGS) ×4 IMPLANT
GLOVE BIO SURGEON STRL SZ7 (GLOVE) IMPLANT
GLOVE BIO SURGEON STRL SZ7.5 (GLOVE) ×4 IMPLANT
GLOVE BIO SURGEON STRL SZ8 (GLOVE) ×4 IMPLANT
GLOVE BIOGEL PI IND STRL 7.0 (GLOVE) IMPLANT
GLOVE BIOGEL PI IND STRL 8 (GLOVE) ×2 IMPLANT
GLOVE BIOGEL PI IND STRL 8.5 (GLOVE) IMPLANT
GLOVE BIOGEL PI INDICATOR 7.0 (GLOVE)
GLOVE BIOGEL PI INDICATOR 8 (GLOVE) ×2
GLOVE BIOGEL PI INDICATOR 8.5 (GLOVE) ×2
GLOVE EXAM NITRILE XL STR (GLOVE) IMPLANT
GLOVE INDICATOR 8.5 STRL (GLOVE) ×4 IMPLANT
GLOVE SS BIOGEL STRL SZ 7.5 (GLOVE) ×2 IMPLANT
GLOVE SUPERSENSE BIOGEL SZ 7.5 (GLOVE) ×2
GLOVE SURG SS PI 8.0 STRL IVOR (GLOVE) ×2 IMPLANT
GOWN STRL REUS W/ TWL LRG LVL3 (GOWN DISPOSABLE) IMPLANT
GOWN STRL REUS W/ TWL XL LVL3 (GOWN DISPOSABLE) ×4 IMPLANT
GOWN STRL REUS W/TWL 2XL LVL3 (GOWN DISPOSABLE) ×4 IMPLANT
GOWN STRL REUS W/TWL LRG LVL3 (GOWN DISPOSABLE)
GOWN STRL REUS W/TWL XL LVL3 (GOWN DISPOSABLE) ×8
GRAFT TRIN ELITE MED MUSC TRAN (Graft) ×2 IMPLANT
HEMOSTAT POWDER KIT SURGIFOAM (HEMOSTASIS) ×4 IMPLANT
HEMOSTAT SNOW SURGICEL 2X4 (HEMOSTASIS) IMPLANT
HEMOSTAT SURGICEL 2X14 (HEMOSTASIS) ×2 IMPLANT
INSERT FOGARTY 61MM (MISCELLANEOUS) IMPLANT
INSERT FOGARTY SM (MISCELLANEOUS) IMPLANT
KIT BASIN OR (CUSTOM PROCEDURE TRAY) ×4 IMPLANT
KIT INFUSE XX SMALL 0.7CC (Orthopedic Implant) ×2 IMPLANT
LOOP VESSEL MAXI BLUE (MISCELLANEOUS) IMPLANT
LOOP VESSEL MINI RED (MISCELLANEOUS) IMPLANT
NDL SPNL 18GX3.5 QUINCKE PK (NEEDLE) ×2 IMPLANT
NEEDLE SPNL 18GX3.5 QUINCKE PK (NEEDLE) ×4 IMPLANT
NS IRRIG 1000ML POUR BTL (IV SOLUTION) ×8 IMPLANT
OIL CARTRIDGE MAESTRO DRILL (MISCELLANEOUS) ×4
PACK LAMINECTOMY NEURO (CUSTOM PROCEDURE TRAY) ×4 IMPLANT
PAD ARMBOARD 7.5X6 YLW CONV (MISCELLANEOUS) ×8 IMPLANT
PUTTY BONE DBX 2.5 MIS (Bone Implant) ×2 IMPLANT
SPACER HEDRON IA 29X39X11 8D (Spacer) ×2 IMPLANT
SPONGE INTESTINAL PEANUT (DISPOSABLE) ×10 IMPLANT
SPONGE LAP 18X18 RF (DISPOSABLE) IMPLANT
SPONGE LAP 4X18 RFD (DISPOSABLE) IMPLANT
SPONGE SURGIFOAM ABS GEL 100 (HEMOSTASIS) ×10 IMPLANT
STAPLER VISISTAT 35W (STAPLE) ×4 IMPLANT
STRIP CLOSURE SKIN 1/2X4 (GAUZE/BANDAGES/DRESSINGS) ×1 IMPLANT
SUT PROLENE 4 0 RB 1 (SUTURE)
SUT PROLENE 4-0 RB1 .5 CRCL 36 (SUTURE) IMPLANT
SUT PROLENE 5 0 CC1 (SUTURE) IMPLANT
SUT PROLENE 6 0 C 1 30 (SUTURE) ×4 IMPLANT
SUT PROLENE 6 0 CC (SUTURE) IMPLANT
SUT SILK 0 TIES 10X30 (SUTURE) ×4 IMPLANT
SUT SILK 2 0 TIES 10X30 (SUTURE) ×4 IMPLANT
SUT SILK 2 0SH CR/8 30 (SUTURE) IMPLANT
SUT SILK 3 0 SH CR/8 (SUTURE) IMPLANT
SUT SILK 3 0 TIES 17X18 (SUTURE) ×4
SUT SILK 3 0SH CR/8 30 (SUTURE) IMPLANT
SUT SILK 3-0 18XBRD TIE BLK (SUTURE) ×2 IMPLANT
SUT VIC AB 0 CT1 18XCR BRD8 (SUTURE) ×2 IMPLANT
SUT VIC AB 0 CT1 27 (SUTURE) ×12
SUT VIC AB 0 CT1 27XBRD ANBCTR (SUTURE) ×4 IMPLANT
SUT VIC AB 0 CT1 27XBRD ANTBC (SUTURE) IMPLANT
SUT VIC AB 0 CT1 8-18 (SUTURE) ×4
SUT VIC AB 2-0 CT1 18 (SUTURE) ×4 IMPLANT
SUT VIC AB 4-0 PS2 27 (SUTURE) ×4 IMPLANT
TOWEL GREEN STERILE (TOWEL DISPOSABLE) ×8 IMPLANT
TOWEL GREEN STERILE FF (TOWEL DISPOSABLE) ×4 IMPLANT
TRAY FOLEY MTR SLVR 16FR STAT (SET/KITS/TRAYS/PACK) ×4 IMPLANT
WATER STERILE IRR 1000ML POUR (IV SOLUTION) ×4 IMPLANT

## 2019-10-05 NOTE — Anesthesia Postprocedure Evaluation (Signed)
Anesthesia Post Note  Patient: Selena [name]  Procedure(s) Performed: Anterior Lumbar Interbody Fusion - Lumbar Five-Sacral One (N/A Spine Lumbar) ABDOMINAL EXPOSURE (N/A )     Patient location during evaluation: PACU Anesthesia Type: General Level of consciousness: awake and alert Pain management: pain level controlled Vital Signs Assessment: post-procedure vital signs reviewed and stable Respiratory status: spontaneous breathing, nonlabored ventilation, respiratory function stable and patient connected to nasal cannula oxygen Cardiovascular status: blood pressure returned to baseline and stable Postop Assessment: no apparent nausea or vomiting Anesthetic complications: no    Last Vitals:  Vitals:   10/05/19 1205 10/05/19 1302  BP: (!) 90/46 134/73  Pulse: (!) 58 70  Resp: 20 20  Temp: 36.5 C   SpO2: 94% 94%    Last Pain:  Vitals:   10/05/19 1205  TempSrc: Oral  PainSc:                  Kip Kautzman COKER

## 2019-10-05 NOTE — Anesthesia Procedure Notes (Signed)
Procedure Name: Intubation Date/Time: 10/05/2019 7:47 AM Performed by: Renato Shin, CRNA Pre-anesthesia Checklist: Patient identified, Emergency Drugs available, Suction available and Patient being monitored Patient Re-evaluated:Patient Re-evaluated prior to induction Oxygen Delivery Method: Circle system utilized Preoxygenation: Pre-oxygenation with 100% oxygen Induction Type: IV induction Ventilation: Mask ventilation without difficulty Laryngoscope Size: Miller and 3 Grade View: Grade I Tube type: Oral Tube size: 7.0 mm Number of attempts: 1 Airway Equipment and Method: Stylet and Oral airway Placement Confirmation: ETT inserted through vocal cords under direct vision,  positive ETCO2 and breath sounds checked- equal and bilateral Secured at: 21 cm Tube secured with: Tape Dental Injury: Teeth and Oropharynx as per pre-operative assessment  Comments: Poor dentition. Remains unchanged after intubation.

## 2019-10-05 NOTE — H&P (Signed)
Selena [name] is an 69 y.o. female.   Chief Complaint: Back pain bilateral hip and leg pain HPI: 69 year old female with longstanding back bilateral hip and leg pain referable to L5-S1.  Patient failed all forms of conservative treatment with physical therapy steroid injections facet blocks they worked both diagnostically and therapeutically for a short period of time.  Due to her progression of clinical syndrome imaging findings and failed conservative treatment I recommended anterior lumbar interbody fusion L5-S1.  I have extensively gone over the risks and benefits of that operation with her as well as perioperative course expectations of outcome and alternatives of surgery and she understands and agrees to proceed forward.  Past Medical History:  Diagnosis Date  . Abnormal findings on imaging of biliary tract 1997-2000   CBD 80mm, Dr Albin Felling biliary/pancreatic manometry, EUS Dr. Ardis Hughs 09/18/07 dilated but otherwise normal extrahepatic duct, no masses  . Arthritis   . Asthma   . Bursitis   . Bursitis    shoulder and both hips  . CAD (coronary artery disease)   . Cervical ca ([place]) 1984  . Chronic abdominal pain    functional, seen @[facility] , extensive WU here benign   . 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 Complication of anesthesia    years ago woke up during surgery  . Constipation   . COPD (chronic obstructive pulmonary disease) ([place])   . Depression    w/ menopause  . Dysrhythmia    AFib  . GERD (gastroesophageal reflux disease)   . H/O hiatal hernia   . Headache    hx of migraines in past  . Heart murmur   . Hemorrhoids   . HTN (hypertension)   . Hyperlipemia   . Hypothyroidism   . MI (myocardial infarction) ([place]) 2000  . Pelvic pain in female 05/24/2015  . Pneumonia   . 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 PUD (peptic ulcer disease) 1997/1998   negative h pylori  . S/P colonoscopy 09/10/07   Dr. Donald Pore sec to tortuous colon, followed by  BE & flex sig normal  . Shortness of breath    with exertion  . Skin cancer    nose  . Stroke ([place])    1983  . Tachycardia    being evaluated recently by Dr. Rollene Fare  . TIA (transient ischemic attack) 1983    Past Surgical History:  Procedure Laterality Date  . ABDOMINAL HYSTERECTOMY    . APPENDECTOMY    . arthroscopic knee    . BACK SURGERY    . CARDIAC CATHETERIZATION  2005  . CATARACT EXTRACTION W/PHACO Right 09/02/2014   Procedure: CATARACT EXTRACTION PHACO AND INTRAOCULAR LENS PLACEMENT RIGHT EYE;  Surgeon: Tonny Branch, MD;  Location: [facility];  Service: Ophthalmology;  Laterality: Right;  CDE:8.96  . CATARACT EXTRACTION W/PHACO Left 09/13/2014   Procedure: CATARACT EXTRACTION PHACO AND INTRAOCULAR LENS PLACEMENT LEFT EYE;  Surgeon: Tonny Branch, MD;  Location: [facility];  Service: Ophthalmology;  Laterality: Left;  CDE 6.20  . CHOLECYSTECTOMY    . EYE SURGERY Bilateral    cataract removal  . INGUINAL HERNIA REPAIR     left  . JOINT REPLACEMENT Bilateral    knees  . PARTIAL HYSTERECTOMY    . right arm     nerve  . TONSILLECTOMY    . TONSILLECTOMY    . TOTAL KNEE ARTHROPLASTY  06/20/2012   Procedure: TOTAL KNEE ARTHROPLASTY;  Surgeon: Yvette Rack., MD;  Location:  [place] [facility];  Service: Orthopedics;  Laterality: Right;  . TOTAL KNEE ARTHROPLASTY Left 04/02/2014   Procedure: TOTAL KNEE ARTHROPLASTY;  Surgeon: Yvette Rack., MD;  Location: [facility];  Service: Orthopedics;  Laterality: Left;    Family History  Problem Relation Age of Onset  . Ulcers Brother   . Heart attack Brother   . Ulcers Sister   . Thyroid disease Daughter   . Hypertension Daughter   . Other Daughter        nervous  . Hypertension Sister   . Heart disease Sister   . Thyroid disease Sister   . Hypertension Sister   . Heart disease Sister   . Thyroid disease Sister   . Heart attack Brother   . Cancer Brother        prostate   Social History:  reports that she quit smoking about 15 years ago. Her  smoking use included cigarettes. She started smoking about 38 years ago. She has a 22.00 pack-year smoking history. She has never used smokeless tobacco. She reports that she does not drink alcohol or use drugs.  Allergies:  Allergies  Allergen Reactions  . Iohexol Shortness Of Breath and Swelling     Desc: Pt states she had Cardia Cath around 2000/ experienced SOB and swelling of upper body during cath procedure/ tsf   . Nubain [Nalbuphine Hcl] Shortness Of Breath and Other (See Comments)    ??  DOSE RELATED  ??  . Penicillins Itching, Rash and Other (See Comments)    PATIENT HAS HAD A PCN REACTION WITH IMMEDIATE RASH, FACIAL/TONGUE/THROAT SWELLING, SOB, OR LIGHTHEADEDNESS WITH HYPOTENSION:  #  #  #  YES  #  #  #   Has patient had a PCN reaction causing severe rash involving mucus membranes or skin necrosis: No Has patient had a PCN reaction that required hospitalization No Has patient had a PCN reaction occurring within the last 10 years: No If all of the above answers are "NO", then may proceed with Cephalosporin use.   . Iodinated Diagnostic Agents Swelling  . Latex Rash    Medications Prior to Admission  Medication Sig Dispense Refill  . albuterol (PROVENTIL HFA;VENTOLIN HFA) 108 (90 Base) MCG/ACT inhaler Inhale 2 puffs into the lungs every 4 (four) hours as needed for wheezing or shortness of breath. 1 Inhaler 0  . aspirin EC 81 MG tablet Take 1 tablet (81 mg total) by mouth daily. 90 tablet 3  . Biotin (BIOTIN 5000) 5 MG CAPS Take 5 mg by mouth daily.    . chlorthalidone (HYGROTON) 25 MG tablet Take 25 mg by mouth daily.    . clobetasol (TEMOVATE) 0.05 % external solution Apply 1 application topically 2 (two) times daily as needed (psoriasis on scalp).   3  . cyclobenzaprine (FLEXERIL) 10 MG tablet Take 10 mg by mouth 3 (three) times daily as needed for muscle spasms.     Marland Kitchen desonide (DESOWEN) 0.05 % cream Apply 1 application topically 2 (two) times daily as needed (for psoriasis  on skin).   3  . diclofenac sodium (VOLTAREN) 1 % GEL Apply 2 g topically daily as needed (for knee pain/arthritis).     Marland Kitchen ELDERBERRY PO Take 2 tablets by mouth daily.    . Ginkgo Biloba Extract (GNP GINGKO BILOBA EXTRACT) 60 MG CAPS Take 120 mg by mouth daily.    Marland Kitchen levothyroxine (SYNTHROID) 137 MCG tablet Take 137 mcg by mouth daily before breakfast.    . losartan (  COZAAR) 100 MG tablet Take 100 mg by mouth daily.    . metoprolol tartrate (LOPRESSOR) 25 MG tablet Take 25 mg by mouth 2 (two) times daily.    Marland Kitchen morphine (MS CONTIN) 30 MG 12 hr tablet Take 30 mg by mouth every 12 (twelve) hours.    Marland Kitchen morphine (MSIR) 15 MG tablet Take 30 mg by mouth 3 (three) times daily as needed for severe pain.     Marland Kitchen MOVANTIK 25 MG TABS tablet Take 25 mg by mouth daily.     . Multiple Vitamin (MULTIVITAMIN WITH MINERALS) TABS tablet Take 1 tablet by mouth daily.    . nitroGLYCERIN (NITROSTAT) 0.4 MG SL tablet Place 0.4 mg under the tongue every 5 (five) minutes as needed for chest pain.     . pantoprazole (PROTONIX) 40 MG tablet Take 1 tablet (40 mg total) by mouth 2 (two) times daily before a meal. 180 tablet 3  . simvastatin (ZOCOR) 20 MG tablet Take 20 mg by mouth at bedtime.     Marland Kitchen zinc gluconate 50 MG tablet Take 50 mg by mouth daily.    Marland Kitchen zolpidem (AMBIEN) 5 MG tablet Take 5 mg by mouth at bedtime as needed for sleep.     . metoprolol tartrate (LOPRESSOR) 50 MG tablet Take 1 tablet (50 mg total) by mouth 2 (two) times daily. 180 tablet 3    No results found for this or any previous visit (from the past 48 hour(s)). No results found.  Review of Systems  Musculoskeletal: Positive for back pain and joint swelling.    Blood pressure (!) 161/65, pulse 83, temperature 98.3 F (36.8 C), temperature source Oral, resp. rate 18, height 5\' 5"  (1.651 m), weight 97.1 kg, SpO2 93 %. Physical Exam  Constitutional: She appears well-developed.  HENT:  Head: Normocephalic.  Eyes: Pupils are equal, round, and  reactive to light.  Cardiovascular: Normal rate.  Respiratory: Effort normal.  GI: Soft.  Musculoskeletal:        General: Normal range of motion.     Cervical back: Normal range of motion.  Neurological: She is alert. She has normal strength. GCS eye subscore is 4. GCS verbal subscore is 5. GCS motor subscore is 6.  Strength 5 out of 5 iliopsoas, quads, hamstrings, gastrocs, anterior tibialis, and EHL.  Skin: Skin is warm.     Assessment/Plan 69 year old presents for anterior lumbar interbody fusion L5-S1  Kadar Chance P, MD 10/05/2019, 7:21 AM

## 2019-10-05 NOTE — H&P (Signed)
History and Physical Interval Note:  10/05/2019 7:18 AM  Selena [name]  has presented today for surgery, with the diagnosis of DDD - Stenosis.  The various methods of treatment have been discussed with the patient and family. After consideration of risks, benefits and other options for treatment, the patient has consented to  Procedure(s): ALIF - L5-S1 (N/A) ABDOMINAL EXPOSURE (N/A) as a surgical intervention.  The patient's history has been reviewed, patient examined, no change in status, stable for surgery.  I have reviewed the patient's chart and labs.  Questions were answered to the patient's satisfaction.    L5-S1 ALIF  Selena [name]  Patient name: Selena [name] MRN: [id number] DOB: 28-Mar-1951 Sex: female  REASON FOR CONSULT: L5-S1 ALIF  HPI:  Selena [name] is a 69 y.o. female, with history of COPD, chronic back pain, hypertension, hyperlipidemia, coronary artery disease status post remote MI, previous TIA stroke, obesity that presents for preop evaluation prior to planned L5-S1 ALIF with Dr. Saintclair Halsted. Patient reports that she has had lower back pain for years. She previously underwent surgery with Dr. Saintclair Halsted from posterior approach on 09/02/17. This included L2-L3 lumbar laminectomies with posterior lumbar interbody fusion at L2-L3 and decompressive lumbar laminectomy at L4-L5 bilaterally. Ultimately Dr. Saintclair Halsted has now recommended an L5-S1 anterior approach given her ongoing issues and chronic back pain. She reports previous abdominal surgery includes laparoscopic cholecystectomy as well as laparoscopic partial tubal ligation. No other large abdominal incisions. She is in fairly significant pain today and states she has trouble even getting on the table. Former smoker, quit 2005.      Past Medical History:  Diagnosis Date  . Abnormal findings on imaging of biliary tract 1997-2000   CBD 92mm, Dr Albin Felling biliary/pancreatic manometry, EUS Dr. Ardis Hughs 09/18/07 dilated but otherwise normal  extrahepatic duct, no masses  . Arthritis   . Bursitis   . Bursitis    shoulder and both hips  . CAD (coronary artery disease)   . Cervical ca ([place]) 1984  . Chronic abdominal pain    functional, seen @[facility] , extensive WU here benign   . 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 Complication of anesthesia    years ago woke up during surgery  . Constipation   . COPD (chronic obstructive pulmonary disease) ([place])   . Depression    w/ menopause  . Dysrhythmia    AFib  . GERD (gastroesophageal reflux disease)   . H/O hiatal hernia   . Headache    hx of migraines in past  . Heart murmur   . Hemorrhoids   . HTN (hypertension)   . Hyperlipemia   . Hypothyroidism   . MI (myocardial infarction) ([place]) 2000  . Pelvic pain in female 05/24/2015  . 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 PUD (peptic ulcer disease) 1997/1998   negative h pylori  . S/P colonoscopy 09/10/07   Dr. Donald Pore sec to tortuous colon, followed by [facility] & flex sig normal  . Shortness of breath    with exertion  . Skin cancer    nose  . Stroke ([place])    1983  . Tachycardia    being evaluated recently by Dr. Rollene Fare  . TIA (transient ischemic attack) 1983        Past Surgical History:  Procedure Laterality Date  . ABDOMINAL HYSTERECTOMY    . APPENDECTOMY    . arthroscopic knee    . CATARACT EXTRACTION W/PHACO Right 09/02/2014  Procedure: CATARACT EXTRACTION PHACO AND INTRAOCULAR LENS PLACEMENT RIGHT EYE; Surgeon: Tonny Branch, MD; Location: [facility]; Service: Ophthalmology; Laterality: Right; CDE:8.96  . CATARACT EXTRACTION W/PHACO Left 09/13/2014   Procedure: CATARACT EXTRACTION PHACO AND INTRAOCULAR LENS PLACEMENT LEFT EYE; Surgeon: Tonny Branch, MD; Location: [facility]; Service: Ophthalmology; Laterality: Left; CDE 6.20  . CHOLECYSTECTOMY    . INGUINAL HERNIA REPAIR     left  . JOINT REPLACEMENT Bilateral    knees  . PARTIAL HYSTERECTOMY    . right arm     nerve  . TONSILLECTOMY    .  TONSILLECTOMY    . TOTAL KNEE ARTHROPLASTY  06/20/2012   Procedure: TOTAL KNEE ARTHROPLASTY; Surgeon: Yvette Rack., MD; Location: [facility]; Service: Orthopedics; Laterality: Right;  . TOTAL KNEE ARTHROPLASTY Left 04/02/2014   Procedure: TOTAL KNEE ARTHROPLASTY; Surgeon: Yvette Rack., MD; Location: [place]; Service: Orthopedics; Laterality: Left;        Family History  Problem Relation Age of Onset  . Ulcers Brother   . Heart attack Brother   . Ulcers Sister   . Thyroid disease Daughter   . Hypertension Daughter   . Other Daughter    nervous  . Hypertension Sister   . Heart disease Sister   . Thyroid disease Sister   . Hypertension Sister   . Heart disease Sister   . Thyroid disease Sister   . Heart attack Brother   . Cancer Brother    prostate   SOCIAL HISTORY:  Social History        Socioeconomic History  . Marital status: Married    Spouse name: Not on file  . Number of children: 1  . Years of education: Not on file  . Highest education level: Not on file  Occupational History  . Occupation: Training and development officer: [employer]  Tobacco Use  . Smoking status: Former Smoker    Packs/day: 1.00    Years: 22.00    Pack years: 22.00    Types: Cigarettes    Start date: 08/06/1981    Quit date: 01/05/2004    Years since quitting: 15.7  . Smokeless tobacco: Never Used  . Tobacco comment: Quit in 2005  Substance and Sexual Activity  . Alcohol use: No    Alcohol/week: 0.0 standard drinks  . Drug use: No  . Sexual activity: Never    Birth control/protection: Surgical    Comment: hyst  Other Topics Concern  . Not on file  Social History Narrative  . Not on file   Social Determinants of Health      Financial Resource Strain:   . Difficulty of Paying Living Expenses: Not on file  Food Insecurity:   . Worried About Charity fundraiser in the Last Year: Not on file  . Ran Out of Food in the Last Year: Not on file  Transportation Needs:   . Lack of  Transportation (Medical): Not on file  . Lack of Transportation (Non-Medical): Not on file  Physical Activity:   . Days of Exercise per Week: Not on file  . Minutes of Exercise per Session: Not on file  Stress:   . Feeling of Stress : Not on file  Social Connections:   . Frequency of Communication with Friends and Family: Not on file  . Frequency of Social Gatherings with Friends and Family: Not on file  . Attends Religious Services: Not on file  . Active Member of Clubs or Organizations: Not on  file  . Attends Archivist Meetings: Not on file  . Marital Status: Not on file  Intimate Partner Violence:   . Fear of Current or Ex-Partner: Not on file  . Emotionally Abused: Not on file  . Physically Abused: Not on file  . Sexually Abused: Not on file        Allergies  Allergen Reactions  . Iohexol Shortness Of Breath and Swelling    Desc: Pt states she had Cardia Cath around 2000/ experienced SOB and swelling of upper body during cath procedure/ tsf   . Nubain [Nalbuphine Hcl] Shortness Of Breath and Other (See Comments)    ?? DOSE RELATED ??  . Penicillins Itching, Rash and Other (See Comments)    PATIENT HAS HAD A PCN REACTION WITH IMMEDIATE RASH, FACIAL/TONGUE/THROAT SWELLING, SOB, OR LIGHTHEADEDNESS WITH HYPOTENSION: # # # YES # # #  Has patient had a PCN reaction causing severe rash involving mucus membranes or skin necrosis: No  Has patient had a PCN reaction that required hospitalization No  Has patient had a PCN reaction occurring within the last 10 years: No  If all of the above answers are "NO", then may proceed with Cephalosporin use.   . Iodinated Diagnostic Agents Swelling  . Latex Rash         Current Outpatient Medications  Medication Sig Dispense Refill  . albuterol (PROVENTIL HFA;VENTOLIN HFA) 108 (90 Base) MCG/ACT inhaler Inhale 2 puffs into the lungs every 4 (four) hours as needed for wheezing or shortness of breath. 1 Inhaler 0  . aspirin EC 81 MG  tablet Take 1 tablet (81 mg total) by mouth daily. 90 tablet 3  . Biotin (BIOTIN 5000) 5 MG CAPS Take 5 mg by mouth daily.    . chlorthalidone (HYGROTON) 25 MG tablet Take 25 mg by mouth daily.    . clobetasol (TEMOVATE) 0.05 % external solution Apply 1 application topically 2 (two) times daily as needed (psoriasis on scalp).   3  . cyclobenzaprine (FLEXERIL) 10 MG tablet Take 10 mg by mouth 3 (three) times daily as needed for muscle spasms.     Marland Kitchen desonide (DESOWEN) 0.05 % cream Apply 1 application topically 2 (two) times daily as needed (for psoriasis on skin).   3  . diclofenac sodium (VOLTAREN) 1 % GEL Apply 2 g topically daily as needed (for knee pain/arthritis).     Marland Kitchen ELDERBERRY PO Take 2 tablets by mouth daily.    . Ginkgo Biloba Extract (GNP GINGKO BILOBA EXTRACT) 60 MG CAPS Take 120 mg by mouth daily.    Marland Kitchen levothyroxine (SYNTHROID) 137 MCG tablet Take 137 mcg by mouth daily before breakfast.    . losartan (COZAAR) 100 MG tablet Take 100 mg by mouth daily.    . metoprolol tartrate (LOPRESSOR) 25 MG tablet Take 25 mg by mouth 2 (two) times daily.    . metoprolol tartrate (LOPRESSOR) 50 MG tablet Take 1 tablet (50 mg total) by mouth 2 (two) times daily. 180 tablet 3  . morphine (MS CONTIN) 30 MG 12 hr tablet Take 30 mg by mouth every 12 (twelve) hours.    Marland Kitchen morphine (MSIR) 15 MG tablet Take 30 mg by mouth 3 (three) times daily as needed for severe pain.     Marland Kitchen MOVANTIK 25 MG TABS tablet Take 25 mg by mouth daily.     . Multiple Vitamin (MULTIVITAMIN WITH MINERALS) TABS tablet Take 1 tablet by mouth daily.    . nitroGLYCERIN (NITROSTAT) 0.4  MG SL tablet Place 0.4 mg under the tongue every 5 (five) minutes as needed for chest pain.     . pantoprazole (PROTONIX) 40 MG tablet Take 1 tablet (40 mg total) by mouth 2 (two) times daily before a meal. 180 tablet 3  . simvastatin (ZOCOR) 20 MG tablet Take 20 mg by mouth at bedtime.     Marland Kitchen zinc gluconate 50 MG tablet Take 50 mg by mouth daily.    Marland Kitchen  zolpidem (AMBIEN) 5 MG tablet Take 5 mg by mouth at bedtime as needed for sleep.      No current facility-administered medications for this visit.   REVIEW OF SYSTEMS:  [X]  denotes positive finding, [ ]  denotes negative finding  Cardiac  Comments:  Chest pain or chest pressure:    Shortness of breath upon exertion:    Short of breath when lying flat:    Irregular heart rhythm:        Vascular    Pain in calf, thigh, or hip brought on by ambulation:    Pain in feet at night that wakes you up from your sleep:     Blood clot in your veins:    Leg swelling:         Pulmonary    Oxygen at home:    Productive cough:     Wheezing:         Neurologic    Sudden weakness in arms or legs:     Sudden numbness in arms or legs:     Sudden onset of difficulty speaking or slurred speech:    Temporary loss of vision in one eye:     Problems with dizziness:     Back pain x   Gastrointestinal    Blood in stool:     Vomited blood:         Genitourinary    Burning when urinating:     Blood in urine:        Psychiatric    Major depression:         Hematologic    Bleeding problems:    Problems with blood clotting too easily:        Skin    Rashes or ulcers:        Constitutional    Fever or chills:    PHYSICAL EXAM:     Vitals:   09/29/19 1059  BP: (!) 145/96  Pulse: 99  Resp: 16  Temp: 97.9 F (36.6 C)  TempSrc: Temporal  SpO2: 93%  Weight: 215 lb (97.5 kg)  Height: 5\' 5"  (1.651 m)   GENERAL: The patient is a well-nourished female, in no acute distress. The vital signs are documented above.  CARDIAC: There is a regular rate and rhythm.  VASCULAR:  1+ femoral pulses palpable bilaterally.  No palpable pedal pulses.  No lower extremity tissue loss.  PULMONARY: There is good air exchange bilaterally without wheezing or rales.  ABDOMEN: Soft and non-tender with normal pitched bowel sounds. Obese. Laparoscopic incision at umbilicus.  MUSCULOSKELETAL: There are no major  deformities or cyanosis.  NEUROLOGIC: No focal weakness or paresthesias are detected.  SKIN: There are no ulcers or rashes noted.  PSYCHIATRIC: The patient has a normal affect.  DATA:  I independently reviewed her MRI from 01/24/2019 and the aortic bifurcation appears to be at L4 with the iliac vein bifurcation slightly lower at L4-L5 disc space. There is a plane between the left iliac vein and the anterior spine.  Assessment/Plan:  69 year old female with multiple medical comorbidities as listed above that presents for preoperative evaluation prior to planned L5-S1 ALIF with Dr. Saintclair Halsted. After review of her MRI as well as examining her, I think should be a very good candidate for anterior approach. I discussed the steps of surgery in detail including transverse incision over the left rectus muscle with mobilization of the peritoneum/intestines as well as the left ureter and mobilization of left iliac artery and vein if necessary. We discussed risks and benefits including injury to the above structures. I look forward helping Dr. Saintclair Halsted. All questions answered and discussed with her husband as well.  Selena Heck, MD  [facility]  Office: [phone]

## 2019-10-05 NOTE — Op Note (Signed)
Date: October 05, 2019  Preoperative diagnosis: Chronic lower back pain with bilateral lower extremity radiculopathy  Postoperative diagnosis: Same  Procedure: Anterior spine exposure at the L5-S1 disc space via left retroperitoneal approach  Surgeon: Dr. [name], MD  Co-surgeon: Dr. Kary Kos, MD  Indications: Patient is a 69 year old female the presents with chronic lower back pain with bilateral lower extremity radiculopathy.  Ultimately she has been under the care of Dr. Saintclair Halsted and has failed conservative management.  He recommends anterior L5-S1 ALIF today after risk benefits were discussed.  Vascular surgery was asked to assist with anterior spine exposure.    Findings: Transverse incision over the left rectus muscle at the level of L5-S1 disc space.  Entered the retroperitoneal space mobilized the peritoneum as well as the left ureter medially to expose the L5-S1 disc space anteriorly.  This did require mobilization of the left iliac vein.  Multiple middle sacral vessels were clipped between vessel clips and divided.  The left rectus was circumferentially divided.  There was some slow venous oozing in the pelvis that ultimately we were able to control with hemostatic agent.  We confirmed that we were at the correct level with a lateral fluoroscopic image and a needle in the disc space at L5-S1.  Anesthesia: General  Details: Patient was taken to the operating room after informed consent was obtained.  Placed on operative table in supine position.  General endotracheal anesthesia was induced.  Ultimately patient got preoperative antibiotics.  We subsequently used the fluoroscopic C arm in the lateral position and marked L5-S1 disc space on the anterior abdominal wall.  The abdominal wall was then prepped and draped in usual sterile fashion.  A preop timeout was performed.  Initially made a transverse incision over the left rectus muscle at the level of the L5-S1 marking.  Dissected  down with Bovie cautery to open the subcutaneous tissue until we encountered the anterior rectus sheath.  Cerebellum retractors were used for added visualization.  The anterior rectus sheath was then opened transversely with Bovie cautery.  I then went lateral and entered the retroperitoneal space and ultimately the peritoneum was mobilized across midline including the left ureter.  I then came back and circumferentially mobilized the left rectus and then this was moved latearl.  That time Dr. Saintclair Halsted used long Wiley hand-held retractors to pull the peritoneum across midline including the left ureter.  I then used a Presenter, broadcasting and a wet lap in the craniocaudal direction and then continued to mobilize across the L5-S1 disc base anteriorly.  Ultimately the L5-S1 disc base was visualized.  I used right angle clamp to get around multiple middle sacral vessels and these were divided between clips and divided.  I then used Kd with suction and bluntly mobilized the left iliac vein in order to get to the left side of the disc space and then mobilized to the opposite side of the disc space as well for retractor placement.  That point in time I then inserted a fixed Thompson retractor.  150 reverse lip retractors were placed on each side the disc spase and then I used a 200 malleable caudally and a 150 malleable cranially.  We inserted a spinal needle and confirmed we were at the correct level on lateral fluoroscopy.  There was some persistent venous oozing from the pelvis where he had mobilized multiple middle sacral vessels.  Ultimately tried to further visualize this and put additional clips.  Ultimately we elected to control this  with hemostatic agent and used thrombin Gelfoam as well as Surgicel snow to get hemostasis.  That point in time the case was turned over to Dr. Saintclair Halsted.  Please see his dictation for the remainder of the case.  Complication: None  Condition: Stable  [name], MD [facility] Office: [place]

## 2019-10-05 NOTE — Evaluation (Signed)
Physical Therapy Evaluation Patient Details Name: Selena [name] MRN: [id number] DOB: 03-25-1951 Today's Date: 10/05/2019   History of Present Illness  Pt is a 69 y/o female who presents s/p L5-S1 ALIF on 10/05/2019. PMH significant for CVA/TIA, tachycardia, MI, hypothyroidism, HTN, COPD, cervical CA, CAD, B TKR, prior back surgery.  Clinical Impression  Pt admitted with above diagnosis. At the time of PT eval, pt was able to demonstrate transfers and ambulation with gross min guard assist to supervision for safety. Pt was educated on precautions, brace application/wearing schedule, appropriate activity progression, and positioning recommendations. Pt currently with functional limitations due to the deficits listed below (see PT Problem List). Pt will benefit from skilled PT to increase their independence and safety with mobility to allow discharge to the venue listed below.      Follow Up Recommendations No PT follow up;Supervision for mobility/OOB    Equipment Recommendations  None recommended by PT    Recommendations for Other Services       Precautions / Restrictions Precautions Precautions: Fall;Back Precaution Booklet Issued: Yes (comment) Precaution Comments: Reviewed precautions verbally as pt familiar with back precautions from prior surgery.  Required Braces or Orthoses: Spinal Brace Spinal Brace: Lumbar corset;Applied in sitting position Restrictions Weight Bearing Restrictions: No      Mobility  Bed Mobility               General bed mobility comments: Pt was received standing in room with NT and husband present.   Transfers Overall transfer level: Needs assistance Equipment used: Rolling walker (2 wheeled) Transfers: Sit to/from Stand Sit to Stand: Min guard         General transfer comment: VC's for hand placement on seated surface for safety. No assist required however close hands on guarding provided.   Ambulation/Gait Ambulation/Gait assistance: Min  guard Gait Distance (Feet): 175 Feet Assistive device: Rolling walker (2 wheeled) Gait Pattern/deviations: Step-through pattern;Decreased stride length;Trunk flexed Gait velocity: Decreased Gait velocity interpretation: <1.31 ft/sec, indicative of household ambulator General Gait Details: VC's for improved posture, closer walker proximity, and forward gaze. Pt moving slow but generally steady with the RW for support.   Stairs            Wheelchair Mobility    Modified Rankin (Stroke Patients Only)       Balance Overall balance assessment: Needs assistance Sitting-balance support: Feet supported;No upper extremity supported Sitting balance-Leahy Scale: Fair     Standing balance support: No upper extremity supported;Bilateral upper extremity supported Standing balance-Leahy Scale: Poor Standing balance comment: Fair with static standing; poor with dynamic activity.                              Pertinent Vitals/Pain Pain Assessment: Faces Faces Pain Scale: Hurts little more Pain Location: abdomen  Pain Descriptors / Indicators: Operative site guarding Pain Intervention(s): Limited activity within patient's tolerance;Monitored during session;Repositioned    Home Living Family/patient expects to be discharged to:: Private residence Living Arrangements: Spouse/significant other Available Help at Discharge: Family;Available 24 hours/day Type of Home: House Home Access: Stairs to enter Entrance Stairs-Rails: None Entrance Stairs-Number of Steps: 3 Home Layout: One level Home Equipment: Cane - single point;Walker - 2 wheels;Bedside commode      Prior Function Level of Independence: Independent               Hand Dominance   Dominant Hand: Right    Extremity/Trunk Assessment   Upper  Extremity Assessment Upper Extremity Assessment: Defer to OT evaluation    Lower Extremity Assessment Lower Extremity Assessment: Generalized weakness(Consistent  with pre-op diagnosis)    Cervical / Trunk Assessment Cervical / Trunk Assessment: Other exceptions Cervical / Trunk Exceptions: s/p surgery  Communication   Communication: No difficulties  Cognition Arousal/Alertness: Awake/alert Behavior During Therapy: WFL for tasks assessed/performed Overall Cognitive Status: Within Functional Limits for tasks assessed                                        General Comments      Exercises     Assessment/Plan    PT Assessment Patient needs continued PT services  PT Problem List Decreased strength;Decreased activity tolerance;Decreased balance;Decreased mobility;Decreased knowledge of use of DME;Decreased safety awareness;Decreased knowledge of precautions;Pain       PT Treatment Interventions DME instruction;Gait training;Stair training;Functional mobility training;Therapeutic activities;Therapeutic exercise;Neuromuscular re-education;Patient/family education    PT Goals (Current goals can be found in the Care Plan section)  Acute Rehab PT Goals Patient Stated Goal: Home tomorrow if able PT Goal Formulation: With patient Time For Goal Achievement: 10/12/19 Potential to Achieve Goals: Good    Frequency Min 5X/week   Barriers to discharge        Co-evaluation               AM-PAC PT "6 Clicks" Mobility  Outcome Measure Help needed turning from your back to your side while in a flat bed without using bedrails?: None Help needed moving from lying on your back to sitting on the side of a flat bed without using bedrails?: None Help needed moving to and from a bed to a chair (including a wheelchair)?: A Little Help needed standing up from a chair using your arms (e.g., wheelchair or bedside chair)?: A Little Help needed to walk in hospital room?: A Little Help needed climbing 3-5 steps with a railing? : A Little 6 Click Score: 20    End of Session Equipment Utilized During Treatment: Back brace Activity  Tolerance: Patient tolerated treatment well Patient left: in chair;with call bell/phone within reach;with family/visitor present Nurse Communication: Mobility status PT Visit Diagnosis: Unsteadiness on feet (R26.81);Pain;Other symptoms and signs involving the nervous system (R29.898) Pain - part of body: (back)    Time: [id number] PT Time Calculation (min) (ACUTE ONLY): 19 min   Charges:   PT Evaluation $PT Eval Moderate Complexity: 1 Mod          Rolinda Roan, PT, DPT Acute Rehabilitation Services Pager: [phone] Office: [phone]   Thelma Comp 10/05/2019, 2:54 PM

## 2019-10-05 NOTE — Anesthesia Procedure Notes (Signed)
Arterial Line Insertion Start/End3/08/2019 7:00 AM, 10/05/2019 7:05 AM Performed by: CRNA  Preanesthetic checklist: patient identified, IV checked, site marked, risks and benefits discussed, surgical consent, monitors and equipment checked, pre-op evaluation, timeout performed and anesthesia consent Lidocaine 1% used for infiltration Left, radial was placed Catheter size: 20 G Hand hygiene performed , maximum sterile barriers used  and Seldinger technique used Allen's test indicative of satisfactory collateral circulation Attempts: 1 Procedure performed without using ultrasound guided technique. Following insertion, dressing applied and Biopatch. Post procedure assessment: normal  Patient tolerated the procedure well with no immediate complications.

## 2019-10-05 NOTE — Op Note (Signed)
Reoperative diagnosis: Degenerative disc disease lumbar instability L5-S1 and grade 1 spondylolisthesis L5-S1  Postoperative diagnosis: Same  Procedure: Anterior lumbar interbody fusion L5-S1 utilizing the globus titanium cage with integrated shanks and Trinity bone graft augmented by BMP and DBX mix  Surgeon Dominica Severin Hinata Diener  Co-surgeon Dr. Fortunato Curling  Anesthesia: General  EBL: 400 with 250 of Cell Saver given back  HPI: 69 year old female longstanding back pain bilateral hip and leg pain referable to L5-S1 failed all forms conservative treatment anti-inflammatories physical therapy steroid injections and facet blocks.  Due to her progression of clinical syndrome imaging findings and failed conservative treatment I recommended anterior lumbar interbody fusion at L5-S1.  I extensively reviewed the risks and benefits of that operation with her as well as perioperative course expectations of outcome and alternatives of surgery and she understands and agrees to proceed forward.  Operative procedure: After exposure we had been achieved by vascular surgery Dr. Carlis Abbott which will be dictated in a separate operative note, intraoperative x-ray confirmed the L5-S1 disc base disc base was then incised and cleaned out pituitary rongeurs curettes and a 2 and 3 Miller Kerrison punch.  Working down the posterior annulus was freed up I then under bit both endplates identified the posterior margin of the disc base confirmed by intraoperative x-ray remove anterior osteophytes with a [place] rongeur squared off the endplates and then utilizing distractors and sequential dilators I selected an 11 mm tall 8 degree large footprint implant.  This was then packed with the Trinity bone graft with BMP sandwiched in between and DBX mix supplemented.  I then inserted this under fluoroscopy and deployed the [place] all thanks again used to [place] at L5 1 and S1 30 mm at L5 27 at S1.  Fluoroscopy confirmed good position of all the  implants [place].  I then packed some additional bone graft and the remainder the BMP laterally postop fluoroscopy and x-rays look good wound was then copiously irrigated meticulous hemostasis was maintained the checks were locked in place and the wound was closed in layers with a running Prolene in the fascia of the rectus and external oblique and then closed the subcutaneous tissue with running Vicryl.  Dermabond and a sterile dressing was applied patient recovery in stable condition.  At the end the case on needle count sponge counts were correct.

## 2019-10-05 NOTE — Transfer of Care (Signed)
Immediate Anesthesia Transfer of Care Note  Patient: Selena [name]  Procedure(s) Performed: Anterior Lumbar Interbody Fusion - Lumbar Five-Sacral One (N/A Spine Lumbar) ABDOMINAL EXPOSURE (N/A )  Patient Location: PACU  Anesthesia Type:General  Level of Consciousness: awake, drowsy and patient cooperative  Airway & Oxygen Therapy: Patient Spontanous Breathing and Patient connected to face mask oxygen  Post-op Assessment: Report given to RN and Post -op Vital signs reviewed and stable  Post vital signs: Reviewed and stable  Last Vitals:  Vitals Value Taken Time  BP 127/82 10/05/19 1050  Temp 37.1 C 10/05/19 1050  Pulse 70 10/05/19 1057  Resp 15 10/05/19 1057  SpO2 98 % 10/05/19 1057  Vitals shown include unvalidated device data.  Last Pain:  Vitals:   10/05/19 0558  TempSrc:   PainSc: 9       Patients Stated Pain Goal: 3 ([id number])  Complications: No apparent anesthesia complications

## 2019-10-06 MED ORDER — CYCLOBENZAPRINE HCL 10 MG PO TABS: 10.0000 mg | ORAL_TABLET | Freq: Three times a day (TID) | ORAL | 0 refills | Status: DC | PRN

## 2019-10-06 MED FILL — Thrombin For Soln 5000 Unit: CUTANEOUS | Qty: 5000 | Status: AC

## 2019-10-06 MED FILL — Thrombin For Soln 20000 Unit: CUTANEOUS | Qty: 1 | Status: AC

## 2019-10-06 NOTE — Care Management (Signed)
Consult acknowledged. No HH or DME needs or orders at this time.

## 2019-10-06 NOTE — Discharge Instructions (Signed)

## 2019-10-06 NOTE — Progress Notes (Signed)
Pt doing well. Pt given D/C instructions with verbal understanding. Incision is clean and dry with no sign of infection. Pt's IV was removed prior to D/C. Pt D/C'd home via wheelchair per MD order. Pt is stable @ D/C and has no other needs at this time. Necie Wilcoxson, RN  

## 2019-10-06 NOTE — Evaluation (Signed)
Occupational Therapy Evaluation Patient Details Name: Selena [name] MRN: [id number] DOB: [birth year] Today's Date: 10/06/2019    History of Present Illness Pt is a 69 y/o female who presents s/p L5-S1 ALIF on 10/05/2019. PMH significant for CVA/TIA, tachycardia, MI, hypothyroidism, HTN, COPD, cervical CA, CAD, B TKR, prior back surgery.   Clinical Impression   Pt PTA: Pt living with spouse and reports independence with ADL and mobility. Pt currently performing LB ADL with figure four technique and good safety awareness at sink for light grooming. Pt able to state back precautions. Back handout provided and reviewed ADL in detail. Pt educated on: clothing between brace, never sleep in brace, set an alarm at night for medication, avoid sitting for long periods of time, correct bed positioning for sleeping, correct sequence for bed mobility, avoiding lifting more than 5 pounds and never wash directly over incision. All education is complete and patient indicates understanding. Pt does not require continued OT skilled services. OT d/c today.     Follow Up Recommendations  No OT follow up    Equipment Recommendations  None recommended by OT    Recommendations for Other Services       Precautions / Restrictions Precautions Precautions: Fall;Back Precaution Booklet Issued: Yes (comment) Precaution Comments: Reviewed precautions verbally as pt familiar with back precautions from prior surgery.  Required Braces or Orthoses: Spinal Brace Spinal Brace: Lumbar corset;Applied in standing position Restrictions Weight Bearing Restrictions: No      Mobility Bed Mobility Overal bed mobility: Modified Independent             General bed mobility comments: log roll to EOB  Transfers Overall transfer level: Needs assistance Equipment used: Rolling walker (2 wheeled) Transfers: Sit to/from Stand Sit to Stand: Supervision              Balance Overall balance assessment: Needs  assistance Sitting-balance support: Feet supported;No upper extremity supported Sitting balance-Leahy Scale: Fair     Standing balance support: No upper extremity supported;Bilateral upper extremity supported Standing balance-Leahy Scale: Fair                             ADL either performed or assessed with clinical judgement   ADL Overall ADL's : At baseline;Modified independent                                     Functional mobility during ADLs: Supervision/safety;Rolling walker;Cueing for safety General ADL Comments: Pt performing LB ADL with figure four technique and good safety awareness at sink for light grooming.     Vision Baseline Vision/History: No visual deficits Patient Visual Report: No change from baseline Vision Assessment?: No apparent visual deficits     Perception     Praxis      Pertinent Vitals/Pain Pain Assessment: Faces Faces Pain Scale: Hurts a little bit Pain Location: low back Pain Descriptors / Indicators: Discomfort Pain Intervention(s): Monitored during session;Ice applied     Hand Dominance Right   Extremity/Trunk Assessment Upper Extremity Assessment Upper Extremity Assessment: Overall WFL for tasks assessed   Lower Extremity Assessment Lower Extremity Assessment: Overall WFL for tasks assessed   Cervical / Trunk Assessment Cervical / Trunk Assessment: Other exceptions Cervical / Trunk Exceptions: s/p surgery   Communication Communication Communication: No difficulties   Cognition Arousal/Alertness: Awake/alert Behavior During Therapy: WFL for tasks assessed/performed Overall Cognitive Status:  Within Functional Limits for tasks assessed                                     General Comments       Exercises     Shoulder Instructions      Home Living Family/patient expects to be discharged to:: Private residence Living Arrangements: Spouse/significant other Available Help at  Discharge: Family;Available 24 hours/day Type of Home: House Home Access: Stairs to enter [employer] of Steps: 3 Entrance Stairs-Rails: None Home Layout: One level     Bathroom Shower/Tub: Teacher, early years/pre: Handicapped height Bathroom Accessibility: Yes   Home Equipment: Cane - single point;Walker - 2 wheels;Bedside commode          Prior Functioning/Environment Level of Independence: Independent                 OT Problem List: Decreased activity tolerance      OT Treatment/Interventions:      OT Goals(Current goals can be found in the care plan section)    OT Frequency:     Barriers to D/C:            Co-evaluation              AM-PAC OT "6 Clicks" Daily Activity     Outcome Measure Help from another person eating meals?: None Help from another person taking care of personal grooming?: None Help from another person toileting, which includes using toliet, bedpan, or urinal?: None Help from another person bathing (including washing, rinsing, drying)?: A Little Help from another person to put on and taking off regular upper body clothing?: None Help from another person to put on and taking off regular lower body clothing?: None 6 Click Score: 23   End of Session Equipment Utilized During Treatment: Rolling walker;Back brace Nurse Communication: Mobility status  Activity Tolerance: Patient tolerated treatment well Patient left: in chair;with call bell/phone within reach  OT Visit Diagnosis: Unsteadiness on feet (R26.81);Pain Pain - part of body: (back)                Time: [id number] OT Time Calculation (min): 20 min Charges:  OT General Charges $OT Visit: 1 Visit OT Evaluation $OT Eval Moderate Complexity: 1 Mod  Jefferey Pica, OTR/L Acute Rehabilitation Services Pager: [phone] Office: [phone]   Brinsley Wence C 10/06/2019, 3:03 PM

## 2019-10-06 NOTE — Discharge Summary (Signed)
Physician Discharge Summary  Patient ID: Selena [name] MRN: [id number] DOB/AGE: 1951/04/13 69 y.o.  Admit date: 10/05/2019 Discharge date: 10/06/2019  Admission Diagnoses: Degenerative disc disease lumbar instability L5-S1 and grade 1 spondylolisthesis L5-S1    Discharge Diagnoses: same   Discharged Condition: good  Hospital Course: The patient was admitted on 10/05/2019 and taken to the operating room where the patient underwent alfi L5-S1. The patient tolerated the procedure well and was taken to the recovery room and then to the floor in stable condition. The hospital course was routine. There were no complications. The wound remained clean dry and intact. Pt had appropriate back soreness. No complaints of leg pain or new N/T/W. The patient remained afebrile with stable vital signs, and tolerated a regular diet. The patient continued to increase activities, and pain was well controlled with oral pain medications.   Consults: None  Significant Diagnostic Studies:  Results for orders placed or performed during the hospital encounter of 10/01/19  SARS CORONAVIRUS 2 (TAT 6-24 HRS) Nasopharyngeal Nasopharyngeal Swab   Specimen: Nasopharyngeal Swab  Result Value Ref Range   SARS Coronavirus 2 NEGATIVE NEGATIVE    DG Lumbar Spine 2-3 Views  Result Date: 10/05/2019 CLINICAL DATA:  L5-S1 anterior lumbar interbody fusion. EXAM: DG C-ARM 1-60 MIN; LUMBAR SPINE - 2-3 VIEW COMPARISON:  Lumbar MRI 01/24/2019. FINDINGS: C-arm fluoroscopy was provided in the operating room. 52 seconds fluoroscopy time. AP and lateral spot fluoroscopic images of the lower lumbar spine are submitted. These demonstrate postsurgical changes consistent with interval anterior discectomy and interbody fusion at L5-S1. The hardware is well positioned. Chronic disc space narrowing is noted at L3-4 and L4-5. No complications are identified. IMPRESSION: Intraoperative views during L5-S1 fusion. Electronically Signed   By: Richardean Sale M.D.   On: 10/05/2019 10:49   DG C-Arm 1-60 Min  Result Date: 10/05/2019 CLINICAL DATA:  L5-S1 anterior lumbar interbody fusion. EXAM: DG C-ARM 1-60 MIN; LUMBAR SPINE - 2-3 VIEW COMPARISON:  Lumbar MRI 01/24/2019. FINDINGS: C-arm fluoroscopy was provided in the operating room. 52 seconds fluoroscopy time. AP and lateral spot fluoroscopic images of the lower lumbar spine are submitted. These demonstrate postsurgical changes consistent with interval anterior discectomy and interbody fusion at L5-S1. The hardware is well positioned. Chronic disc space narrowing is noted at L3-4 and L4-5. No complications are identified. IMPRESSION: Intraoperative views during L5-S1 fusion. Electronically Signed   By: Richardean Sale M.D.   On: 10/05/2019 10:49   DG OR LOCAL ABDOMEN  Result Date: 10/05/2019 CLINICAL DATA:  69 year old female undergoing lumbosacral junction anterior interbody fusion. Instrument count. EXAM: OR LOCAL ABDOMEN COMPARISON:  Intraoperative lumbar images 12819. FINDINGS: Portable AP supine view at 1020 hours. Anterior type interbody implant demonstrated at the lumbosacral junction. Small adjacent surgical clips or cerclage wire. But no retained surgical instruments. Partially visible lumbar scoliosis. Pelvis appears intact. Negative visible bowel gas pattern. IMPRESSION: L5-S1 interbody implant with no retained surgical instruments. Study discussed by telephone with OR personnel on 10/05/2019 at 10:33 . Electronically Signed   By: Genevie Ann M.D.   On: 10/05/2019 10:33    Antibiotics:  Anti-infectives (From admission, onward)   Start     Dose/Rate Route Frequency Ordered Stop   10/05/19 2000  vancomycin (VANCOCIN) IVPB 1000 mg/200 mL premix     1,000 mg 200 mL/hr over 60 Minutes Intravenous  Once 10/05/19 1218 10/05/19 2137   10/05/19 1200  ceFAZolin (ANCEF) IVPB 2g/100 mL premix  Status:  Discontinued  2 g 200 mL/hr over 30 Minutes Intravenous Every 8 hours 10/05/19 1157 10/05/19 1218    10/05/19 0817  bacitracin 50,000 Units in sodium chloride 0.9 % 500 mL irrigation  Status:  Discontinued       As needed 10/05/19 0817 10/05/19 1040   10/05/19 0600  vancomycin (VANCOCIN) IVPB 1000 mg/200 mL premix     1,000 mg 200 mL/hr over 60 Minutes Intravenous On call to O.R. 10/05/19 0544 10/05/19 1239      Discharge Exam: Blood pressure 128/61, pulse 66, temperature 97.6 F (36.4 C), temperature source Oral, resp. rate 18, height 5\' 5"  (1.651 m), weight 97.1 kg, SpO2 92 %. Neurologic: Grossly normal Ambulating and voiding well, incision cdi  Discharge Medications:   Allergies as of 10/06/2019      Reactions   Iohexol Shortness Of Breath, Swelling    Desc: Pt states she had Cardia Cath around 2000/ experienced SOB and swelling of upper body during cath procedure/ tsf   Nubain [nalbuphine Hcl] Shortness Of Breath, Other (See Comments)   ??  DOSE RELATED  ??   Penicillins Itching, Rash, Other (See Comments)   PATIENT HAS HAD A PCN REACTION WITH IMMEDIATE RASH, FACIAL/TONGUE/THROAT SWELLING, SOB, OR LIGHTHEADEDNESS WITH HYPOTENSION:  #  #  #  YES  #  #  #   Has patient had a PCN reaction causing severe rash involving mucus membranes or skin necrosis: No Has patient had a PCN reaction that required hospitalization No Has patient had a PCN reaction occurring within the last 10 years: No If all of the above answers are "NO", then may proceed with Cephalosporin use.   Iodinated Diagnostic Agents Swelling   Latex Rash      Medication List    TAKE these medications   albuterol 108 (90 Base) MCG/ACT inhaler Commonly known as: VENTOLIN HFA Inhale 2 puffs into the lungs every 4 (four) hours as needed for wheezing or shortness of breath.   aspirin EC 81 MG tablet Take 1 tablet (81 mg total) by mouth daily.   Biotin 5000 5 MG Caps Generic drug: Biotin Take 5 mg by mouth daily.   chlorthalidone 25 MG tablet Commonly known as: HYGROTON Take 25 mg by mouth daily.   clobetasol 0.05  % external solution Commonly known as: TEMOVATE Apply 1 application topically 2 (two) times daily as needed (psoriasis on scalp).   cyclobenzaprine 10 MG tablet Commonly known as: FLEXERIL Take 1 tablet (10 mg total) by mouth 3 (three) times daily as needed for muscle spasms.   desonide 0.05 % cream Commonly known as: DESOWEN Apply 1 application topically 2 (two) times daily as needed (for psoriasis on skin).   diclofenac sodium 1 % Gel Commonly known as: VOLTAREN Apply 2 g topically daily as needed (for knee pain/arthritis).   ELDERBERRY PO Take 2 tablets by mouth daily.   GNP Gingko Biloba Extract 60 MG Caps Generic drug: Ginkgo Biloba Extract Take 120 mg by mouth daily.   levothyroxine 137 MCG tablet Commonly known as: SYNTHROID Take 137 mcg by mouth daily before breakfast.   losartan 100 MG tablet Commonly known as: COZAAR Take 100 mg by mouth daily.   metoprolol tartrate 25 MG tablet Commonly known as: LOPRESSOR Take 25 mg by mouth 2 (two) times daily.   metoprolol tartrate 50 MG tablet Commonly known as: LOPRESSOR Take 1 tablet (50 mg total) by mouth 2 (two) times daily.   morphine 30 MG 12 hr tablet Commonly known as: MS  CONTIN Take 30 mg by mouth every 12 (twelve) hours.   morphine 15 MG tablet Commonly known as: MSIR Take 30 mg by mouth 3 (three) times daily as needed for severe pain.   Movantik 25 MG Tabs tablet Generic drug: naloxegol oxalate Take 25 mg by mouth daily.   multivitamin with minerals Tabs tablet Take 1 tablet by mouth daily.   nitroGLYCERIN 0.4 MG SL tablet Commonly known as: NITROSTAT Place 0.4 mg under the tongue every 5 (five) minutes as needed for chest pain.   pantoprazole 40 MG tablet Commonly known as: PROTONIX Take 1 tablet (40 mg total) by mouth 2 (two) times daily before a meal.   simvastatin 20 MG tablet Commonly known as: ZOCOR Take 20 mg by mouth at bedtime.   zinc gluconate 50 MG tablet Take 50 mg by mouth  daily.   zolpidem 5 MG tablet Commonly known as: AMBIEN Take 5 mg by mouth at bedtime as needed for sleep.       Disposition: home  Final Dx: alif L5-S  Discharge Instructions     Remove dressing in 72 hours   Complete by: As directed    Call MD for:  difficulty breathing, headache or visual disturbances   Complete by: As directed    Call MD for:  hives   Complete by: As directed    Call MD for:  persistant nausea and vomiting   Complete by: As directed    Call MD for:  redness, tenderness, or signs of infection (pain, swelling, redness, odor or green/yellow discharge around incision site)   Complete by: As directed    Call MD for:  severe uncontrolled pain   Complete by: As directed    Call MD for:  temperature >100.4   Complete by: As directed    Diet - low sodium heart healthy   Complete by: As directed    Driving Restrictions   Complete by: As directed    No driving for 2 weeks, no riding in the car for 1 week   Increase activity slowly   Complete by: As directed    Lifting restrictions   Complete by: As directed    No lifting more than 8 lbs         Signed: Ocie Cornfield Erasmo Vertz 10/06/2019, 8:19 AM

## 2019-10-06 NOTE — Progress Notes (Signed)
Physical Therapy Treatment Patient Details Name: Selena [name] MRN: [id number] DOB: 1951-01-15 Today's Date: 10/06/2019    History of Present Illness Pt is a 69 y/o female who presents s/p L5-S1 ALIF on 10/05/2019. PMH significant for CVA/TIA, tachycardia, MI, hypothyroidism, HTN, COPD, cervical CA, CAD, B TKR, prior back surgery.    PT Comments    Pt progressing well with post-op mobility. She was able to demonstrate transfers and ambulation with gross modified independence and no AD in the room/RW for hallway ambulation. Min assist provided on the stairs as pt does not have railings, and educated on safe assist from husband for entrance into her home. Pt was also educated on precautions, brace application/wearing schedule, appropriate activity progression, and car transfer. Will continue to follow.      Follow Up Recommendations  No PT follow up;Supervision for mobility/OOB     Equipment Recommendations  None recommended by PT    Recommendations for Other Services       Precautions / Restrictions Precautions Precautions: Fall;Back Precaution Booklet Issued: Yes (comment) Precaution Comments: Reviewed precautions verbally as pt familiar with back precautions from prior surgery.  Required Braces or Orthoses: Spinal Brace Spinal Brace: Lumbar corset;Applied in standing position(As pt was already up in bathroom. ) Restrictions Weight Bearing Restrictions: No    Mobility  Bed Mobility Overal bed mobility: Modified Independent             General bed mobility comments: Pt was received standing in room on her way to the bathroom  Transfers Overall transfer level: Modified independent Equipment used: None Transfers: Sit to/from Stand Sit to Stand: Modified independent (Device/Increase time)         General transfer comment: Pt demonstrated proper hand placement on seated surface for safety. No assist required.   Ambulation/Gait Ambulation/Gait assistance: Modified  independent (Device/Increase time) Gait Distance (Feet): 250 Feet Assistive device: Rolling walker (2 wheeled) Gait Pattern/deviations: Step-through pattern;Decreased stride length;Trunk flexed Gait velocity: Decreased Gait velocity interpretation: 1.31 - 2.62 ft/sec, indicative of limited community ambulator General Gait Details: VC's for improved posture, closer walker proximity, and forward gaze. Pt moving slow but generally steady with the RW. She was able to negotiate around the hospital room without AD.    Stairs Stairs: Yes Stairs assistance: Min assist Stair Management: Step to pattern;Forwards Number of Stairs: 10 General stair comments: HHA on the right and no railings to simulate home environment.    Wheelchair Mobility    Modified Rankin (Stroke Patients Only)       Balance Overall balance assessment: Needs assistance Sitting-balance support: Feet supported;No upper extremity supported Sitting balance-Leahy Scale: Fair     Standing balance support: No upper extremity supported;Bilateral upper extremity supported Standing balance-Leahy Scale: Fair                              Cognition Arousal/Alertness: Awake/alert Behavior During Therapy: WFL for tasks assessed/performed Overall Cognitive Status: Within Functional Limits for tasks assessed                                        Exercises      General Comments        Pertinent Vitals/Pain Pain Assessment: Faces Faces Pain Scale: Hurts a little bit Pain Location: Abdomen Pain Descriptors / Indicators: Operative site guarding Pain Intervention(s): Ice applied;Monitored during session;Repositioned  Home Living Family/patient expects to be discharged to:: Private residence Living Arrangements: Spouse/significant other Available Help at Discharge: Family;Available 24 hours/day Type of Home: House Home Access: Stairs to enter Entrance Stairs-Rails: None Home Layout: One  level Home Equipment: Cane - single point;Walker - 2 wheels;Bedside commode      Prior Function Level of Independence: Independent          PT Goals (current goals can now be found in the care plan section) Acute Rehab PT Goals Patient Stated Goal: Home this morning PT Goal Formulation: With patient Time For Goal Achievement: 10/12/19 Potential to Achieve Goals: Good Progress towards PT goals: Progressing toward goals    Frequency    Min 5X/week      PT Plan Current plan remains appropriate    Co-evaluation              AM-PAC PT "6 Clicks" Mobility   Outcome Measure  Help needed turning from your back to your side while in a flat bed without using bedrails?: None Help needed moving from lying on your back to sitting on the side of a flat bed without using bedrails?: None Help needed moving to and from a bed to a chair (including a wheelchair)?: None Help needed standing up from a chair using your arms (e.g., wheelchair or bedside chair)?: None Help needed to walk in hospital room?: None Help needed climbing 3-5 steps with a railing? : A Little 6 Click Score: 23    End of Session Equipment Utilized During Treatment: Back brace Activity Tolerance: Patient tolerated treatment well Patient left: in chair;with call bell/phone within reach;with family/visitor present Nurse Communication: Mobility status PT Visit Diagnosis: Unsteadiness on feet (R26.81);Pain;Other symptoms and signs involving the nervous system (R29.898) Pain - part of body: (abdomen)     Time: [id number] PT Time Calculation (min) (ACUTE ONLY): 15 min  Charges:  $Gait Training: 8-22 mins                     Rolinda Roan, PT, DPT Acute Rehabilitation Services Pager: [phone] Office: [phone]    Thelma Comp 10/06/2019, 10:25 AM

## 2019-10-06 NOTE — Progress Notes (Signed)
[facility]  Subjective  - pain much better controlled today.  Walking in hall.  Tolerating PO.   Objective 128/61 66 97.6 F (36.4 C) (Oral) 18 92%  Intake/Output Summary (Last 24 hours) at 10/06/2019 0806 Last data filed at 10/05/2019 2123 Gross per 24 hour  Intake 2353 ml  Output 750 ml  Net 1603 ml    Left abdominal incision c/d/i Abdomen soft - appropriate post-op incisional tenderness Palpable left DP pulse  Laboratory Lab Results: No results for input(s): WBC, HGB, HCT, PLT in the last 72 hours. BMET No results for input(s): NA, K, CL, CO2, GLUCOSE, BUN, CREATININE, CALCIUM in the last 72 hours.  COAG Lab Results  Component Value Date   INR 0.96 03/24/2014   INR 0.95 06/13/2012   No results found for: PTT  Assessment/Planning:  Doing well postop day 1 status post L5-S1 ALIF.  Incision looks good.  Palpable left DP pulse.  Tolerating p.o. and passing gas.  Okay for discharge from vascular standpoint.  Hemodynamics stable overnight.  [name] 10/06/2019 8:06 AM --

## 2019-10-07 NOTE — Telephone Encounter (Signed)
Called pt. No answer. I left detailed message asking pt to return  Call.

## 2019-10-09 ENCOUNTER — Encounter: Payer: Self-pay | Admitting: *Deleted

## 2019-10-12 MED FILL — Sodium Chloride IV Soln 0.9%: INTRAVENOUS | Qty: 1000 | Status: AC

## 2019-10-12 MED FILL — Heparin Sodium (Porcine) Inj 1000 Unit/ML: INTRAMUSCULAR | Qty: 30 | Status: AC

## 2019-11-02 ENCOUNTER — Ambulatory Visit: Payer: BC Managed Care – PPO | Attending: Internal Medicine

## 2019-12-02 ENCOUNTER — Encounter (HOSPITAL_COMMUNITY): Payer: Self-pay

## 2019-12-02 ENCOUNTER — Other Ambulatory Visit: Payer: Self-pay

## 2019-12-02 ENCOUNTER — Emergency Department (HOSPITAL_COMMUNITY): Payer: BC Managed Care – PPO

## 2019-12-02 ENCOUNTER — Emergency Department (HOSPITAL_COMMUNITY)
Admission: EM | Admit: 2019-12-02 | Discharge: 2019-12-02 | Disposition: A | Discharge status: Home / Self Care | Payer: BC Managed Care – PPO | Attending: Emergency Medicine | Admitting: Emergency Medicine

## 2019-12-02 DIAGNOSIS — Z23 Encounter for immunization: Secondary | ICD-10-CM | POA: Diagnosis not present

## 2019-12-02 DIAGNOSIS — I2510 Atherosclerotic heart disease of native coronary artery without angina pectoris: Secondary | ICD-10-CM | POA: Insufficient documentation

## 2019-12-02 DIAGNOSIS — E039 Hypothyroidism, unspecified: Secondary | ICD-10-CM | POA: Insufficient documentation

## 2019-12-02 DIAGNOSIS — S0083XA Contusion of other part of head, initial encounter: Secondary | ICD-10-CM | POA: Insufficient documentation

## 2019-12-02 DIAGNOSIS — S51812A Laceration without foreign body of left forearm, initial encounter: Secondary | ICD-10-CM | POA: Diagnosis not present

## 2019-12-02 DIAGNOSIS — Z8673 Personal history of transient ischemic attack (TIA), and cerebral infarction without residual deficits: Secondary | ICD-10-CM | POA: Diagnosis not present

## 2019-12-02 DIAGNOSIS — Z79899 Other long term (current) drug therapy: Secondary | ICD-10-CM | POA: Insufficient documentation

## 2019-12-02 DIAGNOSIS — S40022A Contusion of left upper arm, initial encounter: Secondary | ICD-10-CM | POA: Diagnosis not present

## 2019-12-02 DIAGNOSIS — Y9389 Activity, other specified: Secondary | ICD-10-CM | POA: Insufficient documentation

## 2019-12-02 DIAGNOSIS — Z87891 Personal history of nicotine dependence: Secondary | ICD-10-CM | POA: Insufficient documentation

## 2019-12-02 DIAGNOSIS — Y92019 Unspecified place in single-family (private) house as the place of occurrence of the external cause: Secondary | ICD-10-CM | POA: Insufficient documentation

## 2019-12-02 DIAGNOSIS — S81011A Laceration without foreign body, right knee, initial encounter: Secondary | ICD-10-CM | POA: Diagnosis present

## 2019-12-02 DIAGNOSIS — Y999 Unspecified external cause status: Secondary | ICD-10-CM | POA: Diagnosis not present

## 2019-12-02 DIAGNOSIS — I10 Essential (primary) hypertension: Secondary | ICD-10-CM | POA: Diagnosis not present

## 2019-12-02 DIAGNOSIS — W109XXA Fall (on) (from) unspecified stairs and steps, initial encounter: Secondary | ICD-10-CM | POA: Diagnosis not present

## 2019-12-02 DIAGNOSIS — W19XXXA Unspecified fall, initial encounter: Secondary | ICD-10-CM

## 2019-12-02 MED ORDER — MORPHINE SULFATE (PF) 4 MG/ML IV SOLN
4.0000 mg | Freq: Once | INTRAVENOUS | Status: AC
  Administered 2019-12-02: 4 mg via INTRAVENOUS
  Filled 2019-12-02: qty 1

## 2019-12-02 MED ORDER — MORPHINE SULFATE (PF) 4 MG/ML IV SOLN: 4.0000 mg | Freq: Once | INTRAVENOUS | Status: AC

## 2019-12-02 MED ORDER — MORPHINE SULFATE (PF) 4 MG/ML IV SOLN
INTRAVENOUS | Status: AC
  Administered 2019-12-02: 16:00:00 4 mg via INTRAVENOUS
  Filled 2019-12-02: qty 1

## 2019-12-02 MED ORDER — CEPHALEXIN 500 MG PO CAPS: 500.0000 mg | ORAL_CAPSULE | Freq: Two times a day (BID) | ORAL | 0 refills | Status: DC

## 2019-12-02 MED ORDER — CEFAZOLIN SODIUM 1 G IJ SOLR
1.0000 g | Freq: Once | INTRAMUSCULAR | Status: AC
  Administered 2019-12-02: 1 g via INTRAMUSCULAR
  Filled 2019-12-02: qty 10

## 2019-12-02 MED ORDER — LIDOCAINE-EPINEPHRINE (PF) 2 %-1:200000 IJ SOLN
20.0000 mL | Freq: Once | INTRAMUSCULAR | Status: AC
  Administered 2019-12-02: 20 mL
  Filled 2019-12-02: qty 20

## 2019-12-02 MED ORDER — TETANUS-DIPHTH-ACELL PERTUSSIS 5-2.5-18.5 LF-MCG/0.5 IM SUSP
0.5000 mL | Freq: Once | INTRAMUSCULAR | Status: AC
  Administered 2019-12-02: 0.5 mL via INTRAMUSCULAR
  Filled 2019-12-02: qty 0.5

## 2019-12-02 MED ORDER — HYDROCODONE-ACETAMINOPHEN 5-325 MG PO TABS: 1.0000 | ORAL_TABLET | Freq: Four times a day (QID) | ORAL | 0 refills | Status: DC | PRN

## 2019-12-02 NOTE — ED Provider Notes (Signed)
[facility] EMERGENCY DEPARTMENT Provider Note   CSN: [id number] Arrival date & time: 12/02/19  1241     History Chief Complaint  Patient presents with  . Fall    Selena [name] is a 69 y.o. female.  Patient with history of back surgery, high blood pressure, TIA, chronic back surgery, peptic ulcer disease presents with right knee pain and laceration and left arm pain.  Patient had mechanical fall and tripped down 2 steps.  Patient recalls details no significant head injury.  No syncope.  No neurologic complaints.  Pain with any range of motion.  Bleeding from the right knee laceration.        Past Medical History:  Diagnosis Date  . Abnormal findings on imaging of biliary tract 1997-2000   CBD 27mm, Dr Albin Felling biliary/pancreatic manometry, EUS Dr. Ardis Hughs 09/18/07 dilated but otherwise normal extrahepatic duct, no masses  . Arthritis   . Asthma   . Bursitis   . Bursitis    shoulder and both hips  . CAD (coronary artery disease)   . Cervical ca ([place]) 1984  . Chronic abdominal pain    functional, seen @[facility] , extensive WU here benign   . 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 Complication of anesthesia    years ago woke up during surgery  . Constipation   . COPD (chronic obstructive pulmonary disease) ([place])   . Depression    w/ menopause  . Dysrhythmia    AFib  . GERD (gastroesophageal reflux disease)   . H/O hiatal hernia   . Headache    hx of migraines in past  . Heart murmur   . Hemorrhoids   . HTN (hypertension)   . Hyperlipemia   . Hypothyroidism   . MI (myocardial infarction) ([place]) 2000  . Pelvic pain in female 05/24/2015  . Pneumonia   . 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 PUD (peptic ulcer disease) 1997/1998   negative h pylori  . S/P colonoscopy 09/10/07   Dr. Donald Pore sec to tortuous colon, followed by [facility] & flex sig normal  . Shortness of breath    with exertion  . Skin cancer    nose  . Stroke ([place])    1983  .  Tachycardia    being evaluated recently by Dr. Rollene Fare  . TIA (transient ischemic attack) 1983    Patient Active Problem List   Diagnosis Date Noted  . Spondylolisthesis at L5-S1 level 10/05/2019  . Spondylolisthesis at L2-L3 level 09/02/2017  . Preoperative clearance 07/10/2017  . History of chest pain 07/10/2017  . Hx of colonoscopy 05/25/2015  . 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 Pelvic pain in female 05/24/2015  . Pain in joint, lower leg 04/22/2014  . Osteoarthritis of left knee 04/02/2014  . Stiffness of joint, not elsewhere classified, lower leg 07/24/2012  . Difficulty in walking(719.7) 07/24/2012  . DJD (degenerative joint disease) of knee, end stage R knee 06/21/2012  . HTN (hypertension)   . Depression   . Hyperlipemia   . Hypothyroidism   . Chronic back pain   . PUD (peptic ulcer disease)   . CAD (coronary artery disease)   . Knee pain 01/07/2012  . Abnormality of gait 01/07/2012  . Muscle weakness (generalized) 01/07/2012  . Dysphagia 12/13/2010  . Abnormal finding of biliary tract 12/13/2010  . GERD (gastroesophageal reflux disease) 12/13/2010    Past Surgical History:  Procedure Laterality Date  . ABDOMINAL EXPOSURE N/A 10/05/2019  Procedure: ABDOMINAL EXPOSURE;  Surgeon: [name], MD;  Location: [facility];  Service: Vascular;  Laterality: N/A;  . ABDOMINAL HYSTERECTOMY    . ANTERIOR LUMBAR FUSION N/A 10/05/2019   Procedure: Anterior Lumbar Interbody Fusion - Lumbar Five-Sacral One;  Surgeon: Kary Kos, MD;  Location: [place];  Service: Neurosurgery;  Laterality: N/A;  Anterior Lumbar Interbody Fusion - Lumbar Five-Sacral One  . APPENDECTOMY    . arthroscopic knee    . BACK SURGERY    . CARDIAC CATHETERIZATION  2005  . CATARACT EXTRACTION W/PHACO Right 09/02/2014   Procedure: CATARACT EXTRACTION PHACO AND INTRAOCULAR LENS PLACEMENT RIGHT EYE;  Surgeon: Tonny Branch, MD;  Location: [facility];  Service: Ophthalmology;  Laterality:  Right;  CDE:8.96  . CATARACT EXTRACTION W/PHACO Left 09/13/2014   Procedure: CATARACT EXTRACTION PHACO AND INTRAOCULAR LENS PLACEMENT LEFT EYE;  Surgeon: Tonny Branch, MD;  Location: [facility];  Service: Ophthalmology;  Laterality: Left;  CDE 6.20  . CHOLECYSTECTOMY    . EYE SURGERY Bilateral    cataract removal  . INGUINAL HERNIA REPAIR     left  . JOINT REPLACEMENT Bilateral    knees  . PARTIAL HYSTERECTOMY    . right arm     nerve  . TONSILLECTOMY    . TONSILLECTOMY    . TOTAL KNEE ARTHROPLASTY  06/20/2012   Procedure: TOTAL KNEE ARTHROPLASTY;  Surgeon: Yvette Rack., MD;  Location: [place];  Service: Orthopedics;  Laterality: Right;  . TOTAL KNEE ARTHROPLASTY Left 04/02/2014   Procedure: TOTAL KNEE ARTHROPLASTY;  Surgeon: Yvette Rack., MD;  Location: [place];  Service: Orthopedics;  Laterality: Left;     OB History   No obstetric history on file.     Family History  Problem Relation Age of Onset  . Ulcers Brother   . Heart attack Brother   . Ulcers Sister   . Thyroid disease Daughter   . Hypertension Daughter   . Other Daughter        nervous  . Hypertension Sister   . Heart disease Sister   . Thyroid disease Sister   . Hypertension Sister   . Heart disease Sister   . Thyroid disease Sister   . Heart attack Brother   . Cancer Brother        prostate    Social History   Tobacco Use  . Smoking status: Former Smoker    Packs/day: 1.00    Years: 22.00    Pack years: 22.00    Types: Cigarettes    Start date: 08/06/1981    Quit date: 01/05/2004    Years since quitting: 15.9  . Smokeless tobacco: Never Used  . Tobacco comment: Quit in 2005  Substance Use Topics  . Alcohol use: No    Alcohol/week: 0.0 standard drinks  . Drug use: No    Home Medications Prior to Admission medications   Medication Sig Start Date End Date Taking? Authorizing Provider  albuterol (PROVENTIL HFA;VENTOLIN HFA) 108 (90 Base) MCG/ACT inhaler Inhale 2 puffs into the lungs every 4 (four)  hours as needed for wheezing or shortness of breath. 09/22/16   Francine Graven, DO  aspirin EC 81 MG tablet Take 1 tablet (81 mg total) by mouth daily. 07/10/17   Imogene Burn, PA-C  Biotin (BIOTIN 5000) 5 MG CAPS Take 5 mg by mouth daily.    [provider]  cephALEXin (KEFLEX) 500 MG capsule Take 1 capsule (500 mg total) by mouth 2 (two)  times daily. 12/02/19   Elnora Morrison, MD  chlorthalidone (HYGROTON) 25 MG tablet Take 25 mg by mouth daily.    [provider]  clobetasol (TEMOVATE) 0.05 % external solution Apply 1 application topically 2 (two) times daily as needed (psoriasis on scalp).  06/24/17   [provider]  cyclobenzaprine (FLEXERIL) 10 MG tablet Take 1 tablet (10 mg total) by mouth 3 (three) times daily as needed for muscle spasms. 10/06/19   Meyran, Ocie Cornfield, NP  desonide (DESOWEN) 0.05 % cream Apply 1 application topically 2 (two) times daily as needed (for psoriasis on skin).  04/11/17   [provider]  diclofenac sodium (VOLTAREN) 1 % GEL Apply 2 g topically daily as needed (for knee pain/arthritis).  05/10/16   [provider]  ELDERBERRY PO Take 2 tablets by mouth daily.    [provider]  Ginkgo Biloba Extract (GNP GINGKO BILOBA EXTRACT) 60 MG CAPS Take 120 mg by mouth daily.    [provider]  HYDROcodone-acetaminophen (NORCO) 5-325 MG tablet Take 1-2 tablets by mouth every 6 (six) hours as needed for severe pain. 12/02/19   Elnora Morrison, MD  levothyroxine (SYNTHROID) 137 MCG tablet Take 137 mcg by mouth daily before breakfast.    [provider]  losartan (COZAAR) 100 MG tablet Take 100 mg by mouth daily.    [provider]  metoprolol tartrate (LOPRESSOR) 25 MG tablet Take 25 mg by mouth 2 (two) times daily.    [provider]  metoprolol tartrate (LOPRESSOR) 50 MG tablet Take 1 tablet (50 mg total) by mouth 2 (two) times daily. 04/09/19   Herminio Commons, MD  morphine (MS  CONTIN) 30 MG 12 hr tablet Take 30 mg by mouth every 12 (twelve) hours.    [provider]  morphine (MSIR) 15 MG tablet Take 30 mg by mouth 3 (three) times daily as needed for severe pain.  05/15/16   [provider]  MOVANTIK 25 MG TABS tablet Take 25 mg by mouth daily.  06/11/17   [provider]  Multiple Vitamin (MULTIVITAMIN WITH MINERALS) TABS tablet Take 1 tablet by mouth daily.    [provider]  nitroGLYCERIN (NITROSTAT) 0.4 MG SL tablet Place 0.4 mg under the tongue every 5 (five) minutes as needed for chest pain.     [provider]  pantoprazole (PROTONIX) 40 MG tablet Take 1 tablet (40 mg total) by mouth 2 (two) times daily before a meal. 07/27/16   Annitta Needs, NP  simvastatin (ZOCOR) 20 MG tablet Take 20 mg by mouth at bedtime.  11/24/10   [provider]  zinc gluconate 50 MG tablet Take 50 mg by mouth daily.    [provider]  zolpidem (AMBIEN) 5 MG tablet Take 5 mg by mouth at bedtime as needed for sleep.  02/13/14   [provider]    Allergies    Iohexol, Nubain [nalbuphine hcl], Penicillins, Iodinated diagnostic agents, and Latex  Review of Systems   Review of Systems  Constitutional: Negative for chills and fever.  HENT: Negative for congestion.   Eyes: Negative for visual disturbance.  Respiratory: Negative for shortness of breath.   Cardiovascular: Negative for chest pain.  Gastrointestinal: Negative for abdominal pain and vomiting.  Genitourinary: Negative for dysuria and flank pain.  Musculoskeletal: Positive for gait problem and joint swelling. Negative for back pain, neck pain and neck stiffness.  Skin: Positive for rash and wound.  Neurological: Negative for light-headedness and  headaches.    Physical Exam Updated Vital Signs BP (!) 113/93   Pulse 91   Temp 98.5 F (36.9 C) (Oral)   Resp 18   Ht 5\' 3"  (1.6 m)   Wt 98.9 kg   SpO2 94%   BMI 38.62 kg/m   Physical Exam Vitals  and nursing note reviewed.  Constitutional:      Appearance: She is well-developed.  HENT:     Head: Normocephalic and atraumatic.  Eyes:     General:        Right eye: No discharge.        Left eye: No discharge.     Conjunctiva/sclera: Conjunctivae normal.  Neck:     Trachea: No tracheal deviation.  Cardiovascular:     Rate and Rhythm: Normal rate and regular rhythm.  Pulmonary:     Effort: Pulmonary effort is normal.     Breath sounds: Normal breath sounds.  Abdominal:     General: There is no distension.     Palpations: Abdomen is soft.     Tenderness: There is no abdominal tenderness. There is no guarding.  Musculoskeletal:        General: Swelling, tenderness and signs of injury present.     Cervical back: Normal range of motion and neck supple.     Comments: Patient has significant tenderness and mild swelling right knee joint with gaping laceration approximately 12 cm curved right lateral knee, adipose visualized.  Difficult knee exam due to pain.  No hip tenderness bilateral.  Patient has no midline thoracic or cervical tenderness however has mild paraspinal and midline lumbar tenderness.  Patient has tenderness mid left forearm with ecchymosis and swelling, compartments soft, neurovascular intact.  Superficial abrasions and skin tears to dorsal aspect of left forearm.  Mild bony tenderness to ulna and radius mid aspect.  Skin:    General: Skin is warm.  Neurological:     General: No focal deficit present.     Mental Status: She is alert and oriented to person, place, and time.  Psychiatric:        Mood and Affect: Mood is anxious.     ED Results / Procedures / Treatments   Labs (all labs ordered are listed, but only abnormal results are displayed) Labs Reviewed - No data to display  EKG None  Radiology DG Lumbar Spine Complete  Result Date: 12/02/2019 CLINICAL DATA:  Fall today with low back pain. EXAM: LUMBAR SPINE - COMPLETE 4+ VIEW COMPARISON:  11/17/2019  FINDINGS: Subtle curvature of the lumbar spine convex right unchanged. There is mild spondylosis throughout the lumbar spine to include moderate facet arthropathy. Vertebral body heights are maintained without evidence of acute compression fracture. Posterior fusion hardware with interbody fusion unchanged at the L2-3 level. Interbody fusion at the L5-S1 level unchanged. Severe disc space narrowing at the L4-5 level. Exam is otherwise unchanged IMPRESSION: 1.  No acute findings. 2. Mild spondylosis of the lumbar spine with severe disc disease at the L4-5 level. Stable postsurgical changes at the L2-3 and L5-S1 levels. Electronically Signed   By: Marin Olp M.D.   On: 12/02/2019 15:06   DG Forearm Left  Result Date: 12/02/2019 CLINICAL DATA:  Fall today with left forearm pain. EXAM: LEFT FOREARM - 2 VIEW COMPARISON:  None. FINDINGS: There is no evidence of fracture or other focal bone lesions. Soft tissues are unremarkable. IMPRESSION: No acute findings. Electronically Signed   By: Marin Olp M.D.   On: 12/02/2019 15:02  DG Knee Complete 4 Views Right  Result Date: 12/02/2019 CLINICAL DATA:  Fall today with right knee pain. EXAM: RIGHT KNEE - COMPLETE 4+ VIEW COMPARISON:  None. FINDINGS: Evidence of right total knee arthroplasty intact. No evidence of acute fracture or dislocation. No significant joint effusion. IMPRESSION: No acute findings. Electronically Signed   By: Marin Olp M.D.   On: 12/02/2019 15:01    Procedures .Marland KitchenLaceration Repair  Date/Time: 12/02/2019 3:54 PM Performed by: Elnora Morrison, MD Authorized by: Elnora Morrison, MD   Consent:    Consent obtained:  Verbal   Consent given by:  Patient   Risks discussed:  Infection, pain, need for additional repair, poor cosmetic result, poor wound healing, nerve damage, retained foreign body, tendon damage and vascular damage   Alternatives discussed:  No treatment Anesthesia (see MAR for exact dosages):    Anesthesia method:   Local infiltration   Local anesthetic:  Lidocaine 1% WITH epi Laceration details:    Location:  Leg   Leg location:  R knee   Length (cm):  12   Depth (mm):  15 Repair type:    Repair type:  Complex Pre-procedure details:    Preparation:  Patient was prepped and draped in usual sterile fashion and imaging obtained to evaluate for foreign bodies Exploration:    Hemostasis achieved with:  Direct pressure and epinephrine   Wound exploration: wound explored through full range of motion and entire depth of wound probed and visualized     Wound extent: areolar tissue violated     Wound extent: no foreign bodies/material noted and no underlying fracture noted     Contaminated: no   Treatment:    Area cleansed with:  Betadine and saline   Amount of cleaning:  Extensive   Irrigation solution:  Sterile saline   Irrigation volume:  500   Irrigation method:  Syringe   Visualized foreign bodies/material removed: no     Debridement:  Minimal   Undermining:  Minimal   Scar revision: no   Skin repair:    Repair method:  Sutures   Suture size:  3-0   Suture material:  Prolene   Suture technique:  Simple interrupted   Number of sutures:  11 Approximation:    Approximation:  Close Post-procedure details:    Dressing:  Non-adherent dressing and sterile dressing   Patient tolerance of procedure:  Tolerated well, no immediate complications .Marland KitchenLaceration Repair  Date/Time: 12/02/2019 3:56 PM Performed by: Elnora Morrison, MD Authorized by: Elnora Morrison, MD   Consent:    Consent obtained:  Verbal   Consent given by:  Patient   Risks discussed:  Infection, pain, need for additional repair, nerve damage, poor wound healing, poor cosmetic result, retained foreign body, tendon damage and vascular damage   Alternatives discussed:  No treatment Anesthesia (see MAR for exact dosages):    Anesthesia method:  None Laceration details:    Location:  Shoulder/arm   Shoulder/arm location:  L lower  arm   Length (cm):  10   Depth (mm):  1 Repair type:    Repair type:  Simple Pre-procedure details:    Preparation:  Patient was prepped and draped in usual sterile fashion Exploration:    Hemostasis achieved with:  Direct pressure   Wound extent: no muscle damage noted and no vascular damage noted     Contaminated: no   Approximation:    Approximation:  Loose Post-procedure details:    Dressing:  Non-adherent dressing and sterile dressing  Patient tolerance of procedure:  Tolerated well, no immediate complications Comments:     Multiple skin tears left forearm   (including critical care time)  Medications Ordered in ED Medications  ceFAZolin (ANCEF) injection 1 g (has no administration in time range)  Tdap (BOOSTRIX) injection 0.5 mL (has no administration in time range)  lidocaine-EPINEPHrine (XYLOCAINE W/EPI) 2 %-1:200000 (PF) injection 20 mL (20 mLs Infiltration Given by Other 12/02/19 1338)  morphine 4 MG/ML injection 4 mg (4 mg Intravenous Given 12/02/19 1337)  morphine 4 MG/ML injection 4 mg (4 mg Intravenous Given 12/02/19 1530)    ED Course  I have reviewed the triage vital signs and the nursing notes.  Pertinent labs & imaging results that were available during my care of the patient were reviewed by me and considered in my medical decision making (see chart for details).    MDM Rules/Calculators/A&P                      Patient presents after a mechanical fall leading to left forearm injury with contusion and superficial abrasions and skin tears.  Wound care provided and nonadherent dressing for the left arm.  Patient has significant deep gaping laceration of the right lateral knee, knee replacement history.  X-ray reviewed no acute fracture.  Wound extensively irrigated, tetanus antibiotics ordered and sutures nonabsorbable placed.  Discussed importance of follow-up with primary doctor, wound care and orthopedics.  Patient and husband agree with the plan.  Pain meds  given in the ER.  X-rays reviewed no acute fracture or foreign body. Knee immobilizer and crutches to help with wound care and support. Results and differential diagnosis were discussed with the patient/parent/guardian. Xrays were independently reviewed by myself.  Close follow up outpatient was discussed, comfortable with the plan.   Medications  ceFAZolin (ANCEF) injection 1 g (has no administration in time range)  Tdap (BOOSTRIX) injection 0.5 mL (has no administration in time range)  lidocaine-EPINEPHrine (XYLOCAINE W/EPI) 2 %-1:200000 (PF) injection 20 mL (20 mLs Infiltration Given by Other 12/02/19 1338)  morphine 4 MG/ML injection 4 mg (4 mg Intravenous Given 12/02/19 1337)  morphine 4 MG/ML injection 4 mg (4 mg Intravenous Given 12/02/19 1530)    Vitals:   12/02/19 1300 12/02/19 1315 12/02/19 1400 12/02/19 1530  BP: (!) 113/93     Pulse: 83 87 83 91  Resp: 18  16 18   Temp:      TempSrc:      SpO2:  96% 97% 94%  Weight:      Height:        Final diagnoses:  Knee laceration, right, initial encounter  Fall, initial encounter  Facial contusion, initial encounter  Arm contusion, left, initial encounter  Skin tear of forearm without complication, left, initial encounter    Final Clinical Impression(s) / ED Diagnoses Final diagnoses:  Knee laceration, right, initial encounter  Fall, initial encounter  Facial contusion, initial encounter  Arm contusion, left, initial encounter  Skin tear of forearm without complication, left, initial encounter    Rx / DC Orders ED Discharge Orders         Ordered    HYDROcodone-acetaminophen (NORCO) 5-325 MG tablet  Every 6 hours PRN     12/02/19 1551    cephALEXin (KEFLEX) 500 MG capsule  2 times daily     12/02/19 1551           Elnora Morrison, MD 12/02/19 1559

## 2019-12-02 NOTE — ED Triage Notes (Signed)
EMS reports pt tripped down her steps going to water plants.  Reports had recent back surgery.  PT fell to knees.  PT has laceration to left arm and r leg.  EMS dressed pta.  EMS gave 4mg  morphine PTA.  Pt says r leg hurts the worst.

## 2019-12-02 NOTE — Discharge Instructions (Addendum)
Follow-up with your primary doctor and orthopedics as discussed. Take antibiotics as directed and keep wound clean with gentle soap and water. No hot tubs or swimming pools. Your x-rays did not show any broken bones. Sutures will likely need to be removed in 10 to 14 days, they are NOT absorbable.  For severe pain take norco or vicodin however realize they have the potential for addiction and it can make you sleepy and has tylenol in it.  No operating machinery while taking.

## 2020-08-31 ENCOUNTER — Encounter: Payer: Self-pay | Admitting: Cardiology

## 2020-08-31 ENCOUNTER — Telehealth: Payer: Self-pay | Admitting: Cardiovascular Disease

## 2020-08-31 NOTE — Telephone Encounter (Signed)
Returned call to pt. No answer.

## 2020-08-31 NOTE — Telephone Encounter (Signed)
Returned call to pt. No answer. Left msg to call back.  

## 2020-08-31 NOTE — Telephone Encounter (Signed)
Pt c/o of Chest Pain: STAT if CP now or developed within 24 hours  1. Are you having CP right now?  No   2. Are you experiencing any other symptoms (ex. SOB, nausea, vomiting, sweating)? Sob with exertion , hr in the 120's ,  Pain in arm ,  She said she took multiple nitro to get the pain to go away   3. How long have you been experiencing CP? Recently for a week or so   4. Is your CP continuous or coming and going? Comes and goes   5. Have you taken Nitroglycerin?  Took nitro yesterday at 5pm   Set up appt in [place] with Domenic Polite  ?

## 2020-08-31 NOTE — Progress Notes (Signed)
Cardiology Office Note  Date: 09/01/2020   ID: Selena, [name] 13-May-1951, MRN [id number]  PCP:  Asencion Noble, MD  Cardiologist:  Rozann Lesches, MD Electrophysiologist:  None   Chief Complaint  Patient presents with  . Cardiac follow-up    History of Present Illness: Selena [name] is a 70 y.o. female former patient of Dr. Bronson Ing now presenting to establish follow-up with me.  I reviewed her records and updated the chart.  She was last seen in September 2020.  She presents today reporting recent episodes of recurrent chest tightness.  She was cleaning the inside of her shower on one occasion felt her heart rate elevate with chest pressure radiating to left arm.  She has had exertional symptoms similar to this on other occasions including grocery shopping.  She has not had any syncope.  Record review finds previous cardiac catheterization from 2005 demonstrating normal coronary arteries.  She reports a severe contrast dye allergy, although review of the discharge summary from her assessment in 2005 finds that she did tolerate cardiac catheterization with pretreatment with Solu-Medrol and Benadryl.  She is very worried about undergoing a follow-up cardiac catheterization however.  I reviewed her medications today which are outlined below.  I also reviewed her last Myoview study from 2018.  Study indicated very mild apical ischemia but increased TID ratio which could reflect balanced multivessel distribution ischemia.  She was managed medically by Dr. Bronson Ing, LVEF was normal by concurrent echocardiogram.  Today I talked with her about considering a follow-up cardiac catheterization in light of her recent symptoms and also the abnormal Myoview from 2018.  She is very worried about proceeding with a cardiac catheterization and prefers medical therapy titration for now.  I personally reviewed her ECG which shows sinus rhythm with PVCs and PAC.  Past Medical History:  Diagnosis  Date  . Abnormal findings on imaging of biliary tract 1997-2000   CBD 33mm, Dr Albin Felling biliary/pancreatic manometry, EUS Dr. Ardis Hughs 09/18/07 dilated but otherwise normal extrahepatic duct, no masses  . Arthritis   . Asthma   . Bursitis   . Cervical ca ([place]) 1984  . Chronic abdominal pain    Functional, seen @[facility] , extensive WU here benign   . Chronic back pain    On methadone  . Constipation   . COPD (chronic obstructive pulmonary disease) ([place])   . Depression    w/ menopause  . Essential hypertension   . GERD (gastroesophageal reflux disease)   . H/O hiatal hernia   . Headache    Hx of migraines in past  . Hemorrhoids   . Hyperlipemia   . Hypothyroidism   . MI (myocardial infarction) ([place]) 2000   Details not clear  . Palpitations    PVCs  . Pneumonia   . 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 PUD (peptic ulcer disease) 1997/1998  . S/P colonoscopy 09/10/07   Dr. Donald Pore sec to tortuous colon, followed by [facility] & flex sig normal  . Skin cancer    Nose  . TIA (transient ischemic attack) 1983    Past Surgical History:  Procedure Laterality Date  . ABDOMINAL EXPOSURE N/A 10/05/2019   Procedure: ABDOMINAL EXPOSURE;  Surgeon: [name], MD;  Location: [facility];  Service: Vascular;  Laterality: N/A;  . ABDOMINAL HYSTERECTOMY    . ANTERIOR LUMBAR FUSION N/A 10/05/2019   Procedure: Anterior Lumbar Interbody Fusion - Lumbar Five-Sacral One;  Surgeon: Kary Kos, MD;  Location: [facility];  Service: Neurosurgery;  Laterality: N/A;  Anterior Lumbar Interbody Fusion - Lumbar Five-Sacral One  . APPENDECTOMY    . arthroscopic knee    . BACK SURGERY    . CARDIAC CATHETERIZATION  2005  . CATARACT EXTRACTION W/PHACO Right 09/02/2014   Procedure: CATARACT EXTRACTION PHACO AND INTRAOCULAR LENS PLACEMENT RIGHT EYE;  Surgeon: Tonny Branch, MD;  Location: [facility];  Service: Ophthalmology;  Laterality: Right;  CDE:8.96  . CATARACT EXTRACTION W/PHACO Left 09/13/2014    Procedure: CATARACT EXTRACTION PHACO AND INTRAOCULAR LENS PLACEMENT LEFT EYE;  Surgeon: Tonny Branch, MD;  Location: [facility];  Service: Ophthalmology;  Laterality: Left;  CDE 6.20  . CHOLECYSTECTOMY    . EYE SURGERY Bilateral    cataract removal  . INGUINAL HERNIA REPAIR     left  . JOINT REPLACEMENT Bilateral    knees  . PARTIAL HYSTERECTOMY    . right arm     nerve  . TONSILLECTOMY    . TONSILLECTOMY    . TOTAL KNEE ARTHROPLASTY  06/20/2012   Procedure: TOTAL KNEE ARTHROPLASTY;  Surgeon: Yvette Rack., MD;  Location: [facility];  Service: Orthopedics;  Laterality: Right;  . TOTAL KNEE ARTHROPLASTY Left 04/02/2014   Procedure: TOTAL KNEE ARTHROPLASTY;  Surgeon: Yvette Rack., MD;  Location: [facility];  Service: Orthopedics;  Laterality: Left;    Current Outpatient Medications  Medication Sig Dispense Refill  . albuterol (PROVENTIL HFA;VENTOLIN HFA) 108 (90 Base) MCG/ACT inhaler Inhale 2 puffs into the lungs every 4 (four) hours as needed for wheezing or shortness of breath. 1 Inhaler 0  . aspirin EC 81 MG tablet Take 1 tablet (81 mg total) by mouth daily. 90 tablet 3  . bisacodyl (DULCOLAX) 5 MG EC tablet Take 5 mg by mouth daily as needed for moderate constipation.    . chlorthalidone (HYGROTON) 25 MG tablet Take 25 mg by mouth daily.    . clobetasol (TEMOVATE) 0.05 % external solution Apply 1 application topically 2 (two) times daily as needed (psoriasis on scalp).   3  . cyclobenzaprine (FLEXERIL) 10 MG tablet Take 1 tablet (10 mg total) by mouth 3 (three) times daily as needed for muscle spasms. (Patient taking differently: Take 10 mg by mouth 3 (three) times daily as needed for muscle spasms.) 30 tablet 0  . desonide (DESOWEN) 0.05 % cream Apply 1 application topically 2 (two) times daily as needed (for psoriasis on skin).   3  . diclofenac sodium (VOLTAREN) 1 % GEL Apply 2 g topically daily as needed (for knee pain/arthritis).     Marland Kitchen diphenhydrAMINE (BENADRYL) 25 MG tablet Take 25 mg by  mouth every 6 (six) hours as needed.    Marland Kitchen ELDERBERRY PO Take 2 tablets by mouth daily.    . isosorbide mononitrate (IMDUR) 30 MG 24 hr tablet Take 1 tablet (30 mg total) by mouth daily. 90 tablet 3  . KLOR-CON M10 10 MEQ tablet Take 20 mEq by mouth daily at 6 (six) AM.    . levothyroxine (SYNTHROID) 137 MCG tablet Take 137 mcg by mouth daily before breakfast.    . losartan (COZAAR) 100 MG tablet Take 100 mg by mouth daily.    . metoprolol tartrate (LOPRESSOR) 50 MG tablet Take 1 tablet (50 mg total) by mouth 2 (two) times daily. 180 tablet 3  . morphine (MS CONTIN) 30 MG 12 hr tablet Take 30 mg by mouth every 12 (twelve) hours.    Marland Kitchen morphine (MSIR) 15 MG tablet Take 30  mg by mouth 3 (three) times daily as needed for severe pain.     Marland Kitchen MOVANTIK 25 MG TABS tablet Take 25 mg by mouth daily.     . nitroGLYCERIN (NITROSTAT) 0.4 MG SL tablet Place 0.4 mg under the tongue every 5 (five) minutes as needed for chest pain.    . pantoprazole (PROTONIX) 40 MG tablet Take 1 tablet (40 mg total) by mouth 2 (two) times daily before a meal. 180 tablet 3  . RELISTOR 150 MG TABS Take 2 tablets by mouth every morning.    . simvastatin (ZOCOR) 20 MG tablet Take 20 mg by mouth at bedtime.     Marland Kitchen zolpidem (AMBIEN) 5 MG tablet Take 5 mg by mouth at bedtime as needed for sleep.      No current facility-administered medications for this visit.   Allergies:  Iohexol, Nubain [nalbuphine hcl], Penicillins, Iodinated diagnostic agents, and Latex   ROS: Recurrent palpitations.  No syncope.  Chronic back pain, uses a cane.  Physical Exam: VS:  BP (!) 148/80   Pulse 86   Ht 5\' 5"  (1.651 m)   Wt 224 lb 6.4 oz (101.8 kg)   SpO2 98%   BMI 37.34 kg/m , BMI Body mass index is 37.34 kg/m.  Wt Readings from Last 3 Encounters:  09/01/20 224 lb 6.4 oz (101.8 kg)  12/02/19 218 lb (98.9 kg)  10/05/19 214 lb (97.1 kg)    General: Patient appears comfortable at rest. HEENT: Conjunctiva and lids normal, wearing a  mask. Neck: Supple, no elevated JVP or carotid bruits, no thyromegaly. Lungs: Clear to auscultation, nonlabored breathing at rest. Cardiac: Regular rate and rhythm, no S3, soft systolic murmur, no pericardial rub. Extremities: No pitting edema.  ECG:  An ECG dated 04/09/2019 was personally reviewed today and demonstrated:  Sinus tachycardia with intermittent fusion beats and rightward axis.  Recent Labwork: 10/01/2019: BUN 19; Creatinine, Ser 0.82; Hemoglobin 14.8; Platelets 321; Potassium 3.7; Sodium 140   Other Studies Reviewed Today:  Lexiscan Myoview 07/18/2017:  There was no ST segment deviation noted during stress.  Findings consistent with very mild apical ischemia verse differences in apical thinning. Either finding alone is low risk from a perfusion standpoint.  This is a high risk study. High risk based on decreased LVEF and elevated TID of 1.49. There is no evidence of significant ischemia by perfusion imaging itself, however with decreased LVEF and elevated TID there may be balanced ischemia. Recommend correlating LVEF with echo.  The left ventricular ejection fraction is moderately decreased (30-44%). LVEF is 32%  Echocardiogram 07/18/2017: - Left ventricle: The cavity size was normal. Wall thickness was  increased in a pattern of moderate LVH. Systolic function was  normal. The estimated ejection fraction was in the range of 55%  to 60%. Wall motion was normal; there were no regional wall  motion abnormalities. Doppler parameters are consistent with  abnormal left ventricular relaxation (grade 1 diastolic  dysfunction).  - Aortic valve: There was mild regurgitation. Valve area (VTI):  1.92 cm^2. Valve area (Vmax): 1.91 cm^2. Valve area (Vmean): 1.78  cm^2.  - Technically adequate study.   Assessment and Plan:  1.  Recurrent chest discomfort, potentially angina based on description although with associated palpitations and known history of symptomatic  PVCs.  She did undergo a cardiac catheterization in 2005 that demonstrated normal coronary arteries, there was suspicion of possible vasospasm at that time.  She reports a severe contrast dye allergy, looks to have been successfully pretreated  with steroids and Benadryl in 2005 however per chart review.  She had an abnormal Myoview in 2018 as noted above.  She has a family history of cardiac disease, also personal history of hypertension, hyperlipidemia, and remote TIA.  ECG reviewed.  I discussed possibility of a follow-up cardiac catheterization with appropriate pretreatment for contrast dye allergy, she is very hesitant to pursue this however.  We have agreed on medication titration for now, start Imdur 15 mg daily with up titration to 30 mg daily if tolerated, increase Lopressor to 50 mg twice daily.  Continue aspirin, Cozaar, and Lipitor.  Office follow-up arranged.  2.  History of symptomatic PVCs, beta-blocker being uptitrated.  3.  Mixed hyperlipidemia, on Zocor.  She follows with Dr. Willey Blade.  4.  Essential hypertension, systolic 915 today.  Medications being adjusted as noted above.  Medication Adjustments/Labs and Tests Ordered: Current medicines are reviewed at length with the patient today.  Concerns regarding medicines are outlined above.   Tests Ordered: Orders Placed This Encounter  Procedures  . EKG 12-Lead    Medication Changes: Meds ordered this encounter  Medications  . isosorbide mononitrate (IMDUR) 30 MG 24 hr tablet    Sig: Take 1 tablet (30 mg total) by mouth daily.    Dispense:  90 tablet    Refill:  3    09/01/2020 dose increase    Disposition:  Follow up 6 weeks in the [place] office.  Signed, Satira Sark, MD, [facility] 09/01/2020 10:24 AM    [place] [facility] [place], [place],  [address] Phone: [phone]; Fax: [phone]

## 2020-09-01 ENCOUNTER — Ambulatory Visit (INDEPENDENT_AMBULATORY_CARE_PROVIDER_SITE_OTHER): Payer: BC Managed Care – PPO | Admitting: Cardiology

## 2020-09-01 ENCOUNTER — Encounter: Payer: Self-pay | Admitting: Cardiology

## 2020-09-01 ENCOUNTER — Other Ambulatory Visit: Payer: Self-pay

## 2020-09-01 VITALS — BP 148/80 | HR 86 | Ht 65.0 in | Wt 224.4 lb

## 2020-09-01 DIAGNOSIS — Z91041 Radiographic dye allergy status: Secondary | ICD-10-CM | POA: Diagnosis not present

## 2020-09-01 DIAGNOSIS — Z87898 Personal history of other specified conditions: Secondary | ICD-10-CM

## 2020-09-01 DIAGNOSIS — R079 Chest pain, unspecified: Secondary | ICD-10-CM

## 2020-09-01 DIAGNOSIS — E782 Mixed hyperlipidemia: Secondary | ICD-10-CM

## 2020-09-01 DIAGNOSIS — I10 Essential (primary) hypertension: Secondary | ICD-10-CM

## 2020-09-01 MED ORDER — ISOSORBIDE MONONITRATE ER 30 MG PO TB24
30.0000 mg | ORAL_TABLET | Freq: Every day | ORAL | 3 refills | Status: DC
Start: 2020-09-01 — End: 2020-10-19

## 2020-09-01 NOTE — Patient Instructions (Addendum)
Medication Instructions:   Your physician has recommended you make the following change in your medication:   Start isosorbide mononitrate 15 mg (1/2 tablet) daily in the evening. After 2 weeks, if you are able to tolerate this, increase to 30 mg (whole tablet) daily.   Increase metoprolol tartrate to 50 mg twice daily  Continue other medications the same  Labwork:  none  Testing/Procedures:  none  Follow-Up:  Your physician recommends that you schedule a follow-up appointment in: 6 weeks at the [place] office.   Any Other Special Instructions Will Be Listed Below (If Applicable).  If you need a refill on your cardiac medications before your next appointment, please call your pharmacy.

## 2020-09-01 NOTE — Telephone Encounter (Signed)
Patient has apt with Dr.McDowell today at 9:40 am in the [place] office.I will close encounter.

## 2020-09-13 ENCOUNTER — Other Ambulatory Visit (HOSPITAL_COMMUNITY): Payer: Self-pay | Admitting: Internal Medicine

## 2020-09-13 DIAGNOSIS — Z1231 Encounter for screening mammogram for malignant neoplasm of breast: Secondary | ICD-10-CM

## 2020-09-21 ENCOUNTER — Ambulatory Visit (HOSPITAL_COMMUNITY)
Admission: RE | Admit: 2020-09-21 | Discharge: 2020-09-21 | Disposition: A | Discharge status: Home / Self Care | Payer: BC Managed Care – PPO | Source: Ambulatory Visit | Attending: Internal Medicine | Admitting: Internal Medicine

## 2020-09-21 ENCOUNTER — Other Ambulatory Visit: Payer: Self-pay

## 2020-09-21 DIAGNOSIS — Z1231 Encounter for screening mammogram for malignant neoplasm of breast: Secondary | ICD-10-CM | POA: Insufficient documentation

## 2020-09-28 ENCOUNTER — Other Ambulatory Visit (HOSPITAL_COMMUNITY): Payer: Self-pay | Admitting: Internal Medicine

## 2020-09-28 DIAGNOSIS — R928 Other abnormal and inconclusive findings on diagnostic imaging of breast: Secondary | ICD-10-CM

## 2020-09-30 ENCOUNTER — Ambulatory Visit (HOSPITAL_COMMUNITY)
Admission: RE | Admit: 2020-09-30 | Discharge: 2020-09-30 | Disposition: A | Discharge status: Home / Self Care | Payer: BC Managed Care – PPO | Source: Ambulatory Visit | Attending: Internal Medicine | Admitting: Internal Medicine

## 2020-09-30 ENCOUNTER — Other Ambulatory Visit: Payer: Self-pay

## 2020-09-30 DIAGNOSIS — R928 Other abnormal and inconclusive findings on diagnostic imaging of breast: Secondary | ICD-10-CM | POA: Insufficient documentation

## 2020-10-03 ENCOUNTER — Ambulatory Visit (HOSPITAL_COMMUNITY): Payer: BC Managed Care – PPO

## 2020-10-12 NOTE — Progress Notes (Signed)
Cardiology Office Note    Date:  10/13/2020   ID:  Kenzie, Thoreson Oct 09, 1950, MRN [id number]  PCP:  Asencion Noble, MD  Cardiologist: Rozann Lesches, MD    Chief Complaint  Patient presents with  . Follow-up    6 week visit    History of Pre0sent Illness:    Selena [name] is a 70 y.o. female with past medical history of chest pain (normal cors by cath in 2005, NST in 2018 showing mild apical ischemia but increased TID ratio which could reflect balanced multivessel ischemia), palpitations (PVC's by prior monitor), HTN, HLD and GERD who presents to the office today for 6-week follow-up.  She was examined by Dr. Domenic Polite on 09/01/2020 and reported intermittent episodes of chest tightness which would radiate into her left arm at times and typically occurred with exertion. The possibility of a follow-up cardiac catheterization was reviewed with the patient but she was very hesitant to pursue this given her history of a severe contrast allergy. She previously tolerated catheterization in the past with pretreatment including steroids and Benadryl. At the time of her visit, she preferred medical therapy and was started on Imdur 15 mg daily (with plans to titrate to 30mg  daily) and Lopressor was increased to 50 mg twice daily. She was continued on ASA, Lipitor and Losartan.  In talking with the patient today, she continues to have episodes of chest tightness which radiate into her left arm and can occur at any time. She notices symptoms on days that she is more active and sits down to rest. Reports having a headache since initiation of Imdur but did titrate this to 30 mg daily approximately 2 weeks ago. She continues to have chest pain and has not noticed a significant improvement in her symptoms following medication adjustment. She denies any associated orthopnea, PND or lower extremity edema.  She has talked with her husband and is open to proceeding with a cardiac catheterization at this  time.  Past Medical History:  Diagnosis Date  . Abnormal findings on imaging of biliary tract 1997-2000   CBD 50mm, Dr Albin Felling biliary/pancreatic manometry, EUS Dr. Ardis Hughs 09/18/07 dilated but otherwise normal extrahepatic duct, no masses  . Arthritis   . Asthma   . Bursitis   . Cervical ca ([place]) 1984  . Chronic abdominal pain    Functional, seen @[facility] , extensive WU here benign   . Chronic back pain    On methadone  . Constipation   . COPD (chronic obstructive pulmonary disease) ([place])   . Depression    w/ menopause  . Essential hypertension   . GERD (gastroesophageal reflux disease)   . H/O hiatal hernia   . Headache    Hx of migraines in past  . Hemorrhoids   . Hyperlipemia   . Hypothyroidism   . MI (myocardial infarction) ([place]) 2000   Details not clear  . Palpitations    PVCs  . Pneumonia   . 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 PUD (peptic ulcer disease) 1997/1998  . S/P colonoscopy 09/10/07   Dr. Donald Pore sec to tortuous colon, followed by BE & flex sig normal  . Skin cancer    Nose  . TIA (transient ischemic attack) 1983    Past Surgical History:  Procedure Laterality Date  . ABDOMINAL EXPOSURE N/A 10/05/2019   Procedure: ABDOMINAL EXPOSURE;  Surgeon: [name], MD;  Location: [facility];  Service: Vascular;  Laterality: N/A;  . ABDOMINAL HYSTERECTOMY    .  ANTERIOR LUMBAR FUSION N/A 10/05/2019   Procedure: Anterior Lumbar Interbody Fusion - Lumbar Five-Sacral One;  Surgeon: Kary Kos, MD;  Location: [facility];  Service: Neurosurgery;  Laterality: N/A;  Anterior Lumbar Interbody Fusion - Lumbar Five-Sacral One  . APPENDECTOMY    . arthroscopic knee    . BACK SURGERY    . CARDIAC CATHETERIZATION  2005  . CATARACT EXTRACTION W/PHACO Right 09/02/2014   Procedure: CATARACT EXTRACTION PHACO AND INTRAOCULAR LENS PLACEMENT RIGHT EYE;  Surgeon: Tonny Branch, MD;  Location: [facility];  Service: Ophthalmology;  Laterality: Right;  CDE:8.96  .  CATARACT EXTRACTION W/PHACO Left 09/13/2014   Procedure: CATARACT EXTRACTION PHACO AND INTRAOCULAR LENS PLACEMENT LEFT EYE;  Surgeon: Tonny Branch, MD;  Location: [facility];  Service: Ophthalmology;  Laterality: Left;  CDE 6.20  . CHOLECYSTECTOMY    . EYE SURGERY Bilateral    cataract removal  . INGUINAL HERNIA REPAIR     left  . JOINT REPLACEMENT Bilateral    knees  . PARTIAL HYSTERECTOMY    . right arm     nerve  . TONSILLECTOMY    . TONSILLECTOMY    . TOTAL KNEE ARTHROPLASTY  06/20/2012   Procedure: TOTAL KNEE ARTHROPLASTY;  Surgeon: Yvette Rack., MD;  Location: [facility];  Service: Orthopedics;  Laterality: Right;  . TOTAL KNEE ARTHROPLASTY Left 04/02/2014   Procedure: TOTAL KNEE ARTHROPLASTY;  Surgeon: Yvette Rack., MD;  Location: [facility];  Service: Orthopedics;  Laterality: Left;    Current Medications: Outpatient Medications Prior to Visit  Medication Sig Dispense Refill  . albuterol (PROVENTIL HFA;VENTOLIN HFA) 108 (90 Base) MCG/ACT inhaler Inhale 2 puffs into the lungs every 4 (four) hours as needed for wheezing or shortness of breath. 1 Inhaler 0  . aspirin EC 81 MG tablet Take 1 tablet (81 mg total) by mouth daily. 90 tablet 3  . bisacodyl (DULCOLAX) 5 MG EC tablet Take 5 mg by mouth daily as needed for moderate constipation.    . chlorthalidone (HYGROTON) 25 MG tablet Take 25 mg by mouth daily.    . clobetasol (TEMOVATE) 0.05 % external solution Apply 1 application topically 2 (two) times daily as needed (psoriasis on scalp).   3  . cyclobenzaprine (FLEXERIL) 10 MG tablet Take 1 tablet (10 mg total) by mouth 3 (three) times daily as needed for muscle spasms. (Patient taking differently: Take 10 mg by mouth 3 (three) times daily as needed for muscle spasms.) 30 tablet 0  . desonide (DESOWEN) 0.05 % cream Apply 1 application topically 2 (two) times daily as needed (for psoriasis on skin).   3  . diclofenac sodium (VOLTAREN) 1 % GEL Apply 2 g topically daily as needed (for knee  pain/arthritis).     Marland Kitchen diphenhydrAMINE (BENADRYL) 25 MG tablet Take 25 mg by mouth every 6 (six) hours as needed.    Marland Kitchen ELDERBERRY PO Take 2 tablets by mouth daily.    . isosorbide mononitrate (IMDUR) 30 MG 24 hr tablet Take 1 tablet (30 mg total) by mouth daily. 90 tablet 3  . KLOR-CON M10 10 MEQ tablet Take 20 mEq by mouth daily at 6 (six) AM.    . levothyroxine (SYNTHROID) 137 MCG tablet Take 137 mcg by mouth daily before breakfast.    . losartan (COZAAR) 100 MG tablet Take 100 mg by mouth daily.    . metoprolol tartrate (LOPRESSOR) 50 MG tablet Take 1 tablet (50 mg total) by mouth 2 (two) times daily. 180 tablet 3  .  morphine (MS CONTIN) 30 MG 12 hr tablet Take 30 mg by mouth every 12 (twelve) hours.    Marland Kitchen morphine (MSIR) 15 MG tablet Take 30 mg by mouth 3 (three) times daily as needed for severe pain.     Marland Kitchen MOVANTIK 25 MG TABS tablet Take 25 mg by mouth daily.     . nitroGLYCERIN (NITROSTAT) 0.4 MG SL tablet Place 0.4 mg under the tongue every 5 (five) minutes as needed for chest pain.    . pantoprazole (PROTONIX) 40 MG tablet Take 1 tablet (40 mg total) by mouth 2 (two) times daily before a meal. 180 tablet 3  . RELISTOR 150 MG TABS Take 2 tablets by mouth every morning.    . simvastatin (ZOCOR) 20 MG tablet Take 20 mg by mouth at bedtime.     Marland Kitchen zolpidem (AMBIEN) 5 MG tablet Take 5 mg by mouth at bedtime as needed for sleep.      No facility-administered medications prior to visit.     Allergies:   Iohexol, Nubain [nalbuphine hcl], Penicillins, Iodinated diagnostic agents, and Latex   Social History   Socioeconomic History  . Marital status: Married    Spouse name: Not on file  . Number of children: 1  . Years of education: Not on file  . Highest education level: Not on file  Occupational History  . Occupation: Training and development officer: [employer]  Tobacco Use  . Smoking status: Former Smoker    Packs/day: 1.00    Years: 22.00    Pack years: 22.00    Types: Cigarettes     Start date: 08/06/1981    Quit date: 01/05/2004    Years since quitting: 16.7  . Smokeless tobacco: Never Used  . Tobacco comment: Quit in 2005  Vaping Use  . Vaping Use: Never used  Substance and Sexual Activity  . Alcohol use: No    Alcohol/week: 0.0 standard drinks  . Drug use: No  . Sexual activity: Not on file    Comment: hyst  Other Topics Concern  . Not on file  Social History Narrative  . Not on file   Social Determinants of Health   Financial Resource Strain: Not on file  Food Insecurity: Not on file  Transportation Needs: Not on file  Physical Activity: Not on file  Stress: Not on file  Social Connections: Not on file     Family History:  The patient's family history includes Cancer in her brother; Heart attack in her brother and brother; Heart disease in her sister and sister; Hypertension in her daughter, sister, and sister; Other in her daughter; Thyroid disease in her daughter, sister, and sister; Ulcers in her brother and sister.   Review of Systems:   Please see the history of present illness.     General:  No chills, fever, night sweats or weight changes.  Cardiovascular:  No edema, orthopnea, palpitations, paroxysmal nocturnal dyspnea. Positive for chest pain and dyspnea on exertion.  Dermatological: No rash, lesions/masses Respiratory: No cough, dyspnea Urologic: No hematuria, dysuria Abdominal:   No nausea, vomiting, diarrhea, bright red blood per rectum, melena, or hematemesis Neurologic:  No visual changes, wkns, changes in mental status. All other systems reviewed and are otherwise negative except as noted above.   Physical Exam:    VS:  BP (!) 154/90   Pulse 70   Ht 5\' 5"  (1.651 m)   Wt 221 lb 6.4 oz (100.4 kg)   SpO2 94%  BMI 36.84 kg/m    General: Well developed, well nourished,female appearing in no acute distress. Head: Normocephalic, atraumatic. Neck: No carotid bruits. JVD not elevated.  Lungs: Respirations regular and unlabored,  without wheezes or rales.  Heart: Regular rate and rhythm. No S3 or S4.  No murmur, no rubs, or gallops appreciated. Abdomen: Appears non-distended. No obvious abdominal masses. Msk:  Strength and tone appear normal for age. No obvious joint deformities or effusions. Extremities: No clubbing or cyanosis. No lower extremity edema.  Distal pedal pulses are 2+ bilaterally. Neuro: Alert and oriented X 3. Moves all extremities spontaneously. No focal deficits noted. Psych:  Responds to questions appropriately with a normal affect. Skin: No rashes or lesions noted  Wt Readings from Last 3 Encounters:  10/13/20 221 lb 6.4 oz (100.4 kg)  09/01/20 224 lb 6.4 oz (101.8 kg)  12/02/19 218 lb (98.9 kg)     Studies/Labs Reviewed:   EKG:  EKG is not ordered today. EKG from 09/01/2020 is reviewed and shows NSR, HR 88 with PAC's and PVC's.   Recent Labs: 10/13/2020: BUN 15; Creatinine, Ser 0.85; Hemoglobin 11.7; Platelets 339; Potassium 4.3; Sodium 136   Lipid Panel    Component Value Date/Time   CHOL  05/14/2008 0500    180        ATP III CLASSIFICATION:  <200     mg/dL   Desirable  200-239  mg/dL   Borderline High  >=240    mg/dL   High   TRIG 296 (H) 05/14/2008 0500   HDL 41 05/14/2008 0500   CHOLHDL 4.4 05/14/2008 0500   VLDL 59 (H) 05/14/2008 0500   LDLCALC  05/14/2008 0500    80        Total Cholesterol/HDL:CHD Risk Coronary Heart Disease Risk Table                     Men   Women  1/2 Average Risk   3.4   3.3    Additional studies/ records that were reviewed today include:   Echocardiogram: 07/2017 Study Conclusions   - Left ventricle: The cavity size was normal. Wall thickness was  increased in a pattern of moderate LVH. Systolic function was  normal. The estimated ejection fraction was in the range of 55%  to 60%. Wall motion was normal; there were no regional wall  motion abnormalities. Doppler parameters are consistent with  abnormal left ventricular relaxation  (grade 1 diastolic  dysfunction).  - Aortic valve: There was mild regurgitation. Valve area (VTI):  1.92 cm^2. Valve area (Vmax): 1.91 cm^2. Valve area (Vmean): 1.78  cm^2.  - Technically adequate study.   NST: 07/2017  There was no ST segment deviation noted during stress.  Findings consistent with very mild apical ischemia verse differences in apical thinning. Either finding alone is low risk from a perfusion standpoint.  This is a high risk study. High risk based on decreased LVEF and elevated TID of 1.49. There is no evidence of significant ischemia by perfusion imaging itself, however with decreased LVEF and elevated TID there may be balanced ischemia. Recommend correlating LVEF with echo.  The left ventricular ejection fraction is moderately decreased (30-44%). LVEF is 32%     Assessment:    1. Accelerating angina ([place])   2. History of palpitations   3. Essential hypertension   4. Hyperlipidemia LDL goal <70      Plan:   In order of problems listed above:  1. Chest Pain Concerning  for Accelerating Angina - She continues to have episodes of chest pain and dyspnea on exertion which have persisted despite titration of medical therapy. Given her symptoms and prior abnormal NST in 2018, a cardiac catheterization was previously reviewed with the patient at her last visit with Dr. Domenic Polite but she did not wish to proceed at that time. She is now in agreement to proceed and we reviewed that she would be premedicated for her contrast allergy prior to the procedure. The patient understands that risks include but are not limited to stroke (1 in 1000), death (1 in 56), kidney failure [usually temporary] (1 in 500), bleeding (1 in 200), allergic reaction [possibly serious] (1 in 200).  Will try to arrange for next week. Will arrange for CBC and BMET along with pre-procedure COVID testing.  - Continue ASA 81 mg daily, Imdur 30 mg daily, Lopressor 50 mg twice daily and Simvastatin 20  mg daily. She has been experiencing a headache with Imdur but says she is tolerating at this time. Will continue until the time of her catheterization but can hopefully discontinue afterwards given her side effects. Reviewed she could reduce to 15mg  daily again if needed.   2. Palpitations -She did have PAC's and PVC's by prior EKG. Lopressor was recently titrated to 50 mg twice daily and will continue current dosing for now.  3. HTN - BP is elevated at 154/90 during today's visit and she reports this has been variable when checked at home.  Continue current regimen for now with Chlorthalidone 25 mg daily, Imdur 30 mg daily, Losartan 100 mg daily and Lopressor 50 mg twice daily. If BP remains above goal following her catheterization, would consider switching Lopressor to Coreg or adding Amlodipine.  4. HLD - Followed by PCP and will request most recent records. She remains on Simvastatin 20 mg daily.   Medication Adjustments/Labs and Tests Ordered: Current medicines are reviewed at length with the patient today.  Concerns regarding medicines are outlined above.  Medication changes, Labs and Tests ordered today are listed in the Patient Instructions below. Patient Instructions  Medication Instructions:  Your physician recommends that you continue on your current medications as directed. Please refer to the Current Medication list given to you today.  *If you need a refill on your cardiac medications before your next appointment, please call your pharmacy*   Lab Work: CBC BMET  If you have labs (blood work) drawn today and your tests are completely normal, you will receive your results only by: Marland Kitchen MyChart Message (if you have MyChart) OR . A paper copy in the mail If you have any lab test that is abnormal or we need to change your treatment, we will call you to review the results.   Testing/Procedures: Your physician has requested that you have a cardiac catheterization. Cardiac  catheterization is used to diagnose and/or treat various heart conditions. Doctors may recommend this procedure for a number of different reasons. The most common reason is to evaluate chest pain. Chest pain can be a symptom of coronary artery disease (CAD), and cardiac catheterization can show whether plaque is narrowing or blocking your heart's arteries. This procedure is also used to evaluate the valves, as well as measure the blood flow and oxygen levels in different parts of your heart. For further information please visit [email or web address]. Please follow instruction sheet, as given.     Follow-Up: At [facility], you and your health needs are our priority.  As part of our continuing mission  to provide you with exceptional heart care, we have created designated Provider Care Teams.  These Care Teams include your primary Cardiologist (physician) and Advanced Practice Providers (APPs -  Physician Assistants and Nurse Practitioners) who all work together to provide you with the care you need, when you need it.  We recommend signing up for the patient portal called "MyChart".  Sign up information is provided on this After Visit Summary.  MyChart is used to connect with patients for Virtual Visits (Telemedicine).  Patients are able to view lab/test results, encounter notes, upcoming appointments, etc.  Non-urgent messages can be sent to your provider as well.   To learn more about what you can do with MyChart, go to [email or web address].    Your next appointment:   3-4 week(s)  The format for your next appointment:   In Person  Provider:   You may see Rozann Lesches, MD or one of the following Advanced Practice Providers on your designated Care Team:   Bernerd Pho, [place]     Other [place] [address] Dept: [phone] Loc: [facility]  10/13/2020  You are scheduled for a Cardiac Catheterization on Wednesday, March 16 with Dr. Daneen Schick.  1. Please arrive at the [facility] (Main Entrance A) at [facility]: [address] [place], [place] [address] at 6:30 AM (This time is two hours before your procedure to ensure your preparation). Free valet parking service is available.   Special note: Every effort is made to have your procedure done on time. Please understand that emergencies sometimes delay scheduled procedures.  2. Diet: Do not eat solid foods after midnight.  The patient may have clear liquids until 5am upon the day of the procedure.  3. Labs: You will need to have blood drawn on Thursday, March 10 at [facility] [address], [place]  Open: 7am - 6pm, Sat 8am - 12 noon   Phone: [phone]. You do not need to be fasting.  4. Medication instructions in preparation for your procedure:   Contrast Allergy: Yes, Please take Prednisone 50mg  by mouth at: Thirteen hours prior to cath 7:00pm on Tuesday Seven hours prior to cath 1:00am on Wednesday And prior to leaving home please take last dose of Prednisone 50mg  and Benadryl 50mg  by mouth.  On the morning of your procedure, take your Aspirin and any morning medicines NOT listed above.  You may use sips of water.  5. Plan for one night stay--bring personal belongings. 6. Bring a current list of your medications and current insurance cards. 7. You MUST have a responsible person to drive you home. 8. Someone MUST be with you the first 24 hours after you arrive home or your discharge will be delayed. 9. Please wear clothes that are easy to get on and off and wear slip-on shoes.  Thank you for allowing [place] to care for you!   -- [facility]      Signed, Erma Heritage, Hershal Coria  10/13/2020 5:30 PM    [facility]. [address] [place], [place] [address] Phone: [phone] Fax:  [phone]

## 2020-10-12 NOTE — H&P (View-Only) (Signed)
Cardiology Office Note    Date:  10/13/2020   ID:  Selena [name], Selena [name] 06-Jan-1951, MRN [id number]  PCP:  Asencion Noble, MD  Cardiologist: Rozann Lesches, MD    Chief Complaint  Patient presents with  . Follow-up    6 week visit    History of Pre0sent Illness:    Selena [name] is a 70 y.o. female with past medical history of chest pain (normal cors by cath in 2005, NST in 2018 showing mild apical ischemia but increased TID ratio which could reflect balanced multivessel ischemia), palpitations (PVC's by prior monitor), HTN, HLD and GERD who presents to the office today for 6-week follow-up.  She was examined by Dr. Domenic Polite on 09/01/2020 and reported intermittent episodes of chest tightness which would radiate into her left arm at times and typically occurred with exertion. The possibility of a follow-up cardiac catheterization was reviewed with the patient but she was very hesitant to pursue this given her history of a severe contrast allergy. She previously tolerated catheterization in the past with pretreatment including steroids and Benadryl. At the time of her visit, she preferred medical therapy and was started on Imdur 15 mg daily (with plans to titrate to 30mg  daily) and Lopressor was increased to 50 mg twice daily. She was continued on ASA, Lipitor and Losartan.  In talking with the patient today, she continues to have episodes of chest tightness which radiate into her left arm and can occur at any time. She notices symptoms on days that she is more active and sits down to rest. Reports having a headache since initiation of Imdur but did titrate this to 30 mg daily approximately 2 weeks ago. She continues to have chest pain and has not noticed a significant improvement in her symptoms following medication adjustment. She denies any associated orthopnea, PND or lower extremity edema.  She has talked with her husband and is open to proceeding with a cardiac catheterization at this  time.  Past Medical History:  Diagnosis Date  . Abnormal findings on imaging of biliary tract 1997-2000   CBD 88mm, Dr Albin Felling biliary/pancreatic manometry, EUS Dr. Ardis Hughs 09/18/07 dilated but otherwise normal extrahepatic duct, no masses  . Arthritis   . Asthma   . Bursitis   . Cervical ca ([place]) 1984  . Chronic abdominal pain    Functional, seen @[facility] , extensive WU here benign   . Chronic back pain    On methadone  . Constipation   . COPD (chronic obstructive pulmonary disease) ([place])   . Depression    w/ menopause  . Essential hypertension   . GERD (gastroesophageal reflux disease)   . H/O hiatal hernia   . Headache    Hx of migraines in past  . Hemorrhoids   . Hyperlipemia   . Hypothyroidism   . MI (myocardial infarction) ([place]) 2000   Details not clear  . Palpitations    PVCs  . Pneumonia   . 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 PUD (peptic ulcer disease) 1997/1998  . S/P colonoscopy 09/10/07   Dr. Donald Pore sec to tortuous colon, followed by BE & flex sig normal  . Skin cancer    Nose  . TIA (transient ischemic attack) 1983    Past Surgical History:  Procedure Laterality Date  . ABDOMINAL EXPOSURE N/A 10/05/2019   Procedure: ABDOMINAL EXPOSURE;  Surgeon: [name], MD;  Location: [place];  Service: Vascular;  Laterality: N/A;  . ABDOMINAL HYSTERECTOMY    .  ANTERIOR LUMBAR FUSION N/A 10/05/2019   Procedure: Anterior Lumbar Interbody Fusion - Lumbar Five-Sacral One;  Surgeon: Kary Kos, MD;  Location: [place];  Service: Neurosurgery;  Laterality: N/A;  Anterior Lumbar Interbody Fusion - Lumbar Five-Sacral One  . APPENDECTOMY    . arthroscopic knee    . BACK SURGERY    . CARDIAC CATHETERIZATION  2005  . CATARACT EXTRACTION W/PHACO Right 09/02/2014   Procedure: CATARACT EXTRACTION PHACO AND INTRAOCULAR LENS PLACEMENT RIGHT EYE;  Surgeon: Tonny Branch, MD;  Location: [facility];  Service: Ophthalmology;  Laterality: Right;  CDE:8.96  .  CATARACT EXTRACTION W/PHACO Left 09/13/2014   Procedure: CATARACT EXTRACTION PHACO AND INTRAOCULAR LENS PLACEMENT LEFT EYE;  Surgeon: Tonny Branch, MD;  Location: [facility];  Service: Ophthalmology;  Laterality: Left;  CDE 6.20  . CHOLECYSTECTOMY    . EYE SURGERY Bilateral    cataract removal  . INGUINAL HERNIA REPAIR     left  . JOINT REPLACEMENT Bilateral    knees  . PARTIAL HYSTERECTOMY    . right arm     nerve  . TONSILLECTOMY    . TONSILLECTOMY    . TOTAL KNEE ARTHROPLASTY  06/20/2012   Procedure: TOTAL KNEE ARTHROPLASTY;  Surgeon: Yvette Rack., MD;  Location: [facility];  Service: Orthopedics;  Laterality: Right;  . TOTAL KNEE ARTHROPLASTY Left 04/02/2014   Procedure: TOTAL KNEE ARTHROPLASTY;  Surgeon: Yvette Rack., MD;  Location: [facility];  Service: Orthopedics;  Laterality: Left;    Current Medications: Outpatient Medications Prior to Visit  Medication Sig Dispense Refill  . albuterol (PROVENTIL HFA;VENTOLIN HFA) 108 (90 Base) MCG/ACT inhaler Inhale 2 puffs into the lungs every 4 (four) hours as needed for wheezing or shortness of breath. 1 Inhaler 0  . aspirin EC 81 MG tablet Take 1 tablet (81 mg total) by mouth daily. 90 tablet 3  . bisacodyl (DULCOLAX) 5 MG EC tablet Take 5 mg by mouth daily as needed for moderate constipation.    . chlorthalidone (HYGROTON) 25 MG tablet Take 25 mg by mouth daily.    . clobetasol (TEMOVATE) 0.05 % external solution Apply 1 application topically 2 (two) times daily as needed (psoriasis on scalp).   3  . cyclobenzaprine (FLEXERIL) 10 MG tablet Take 1 tablet (10 mg total) by mouth 3 (three) times daily as needed for muscle spasms. (Patient taking differently: Take 10 mg by mouth 3 (three) times daily as needed for muscle spasms.) 30 tablet 0  . desonide (DESOWEN) 0.05 % cream Apply 1 application topically 2 (two) times daily as needed (for psoriasis on skin).   3  . diclofenac sodium (VOLTAREN) 1 % GEL Apply 2 g topically daily as needed (for knee  pain/arthritis).     Marland Kitchen diphenhydrAMINE (BENADRYL) 25 MG tablet Take 25 mg by mouth every 6 (six) hours as needed.    Marland Kitchen ELDERBERRY PO Take 2 tablets by mouth daily.    . isosorbide mononitrate (IMDUR) 30 MG 24 hr tablet Take 1 tablet (30 mg total) by mouth daily. 90 tablet 3  . KLOR-CON M10 10 MEQ tablet Take 20 mEq by mouth daily at 6 (six) AM.    . levothyroxine (SYNTHROID) 137 MCG tablet Take 137 mcg by mouth daily before breakfast.    . losartan (COZAAR) 100 MG tablet Take 100 mg by mouth daily.    . metoprolol tartrate (LOPRESSOR) 50 MG tablet Take 1 tablet (50 mg total) by mouth 2 (two) times daily. 180 tablet 3  .  morphine (MS CONTIN) 30 MG 12 hr tablet Take 30 mg by mouth every 12 (twelve) hours.    Marland Kitchen morphine (MSIR) 15 MG tablet Take 30 mg by mouth 3 (three) times daily as needed for severe pain.     Marland Kitchen MOVANTIK 25 MG TABS tablet Take 25 mg by mouth daily.     . nitroGLYCERIN (NITROSTAT) 0.4 MG SL tablet Place 0.4 mg under the tongue every 5 (five) minutes as needed for chest pain.    . pantoprazole (PROTONIX) 40 MG tablet Take 1 tablet (40 mg total) by mouth 2 (two) times daily before a meal. 180 tablet 3  . RELISTOR 150 MG TABS Take 2 tablets by mouth every morning.    . simvastatin (ZOCOR) 20 MG tablet Take 20 mg by mouth at bedtime.     Marland Kitchen zolpidem (AMBIEN) 5 MG tablet Take 5 mg by mouth at bedtime as needed for sleep.      No facility-administered medications prior to visit.     Allergies:   Iohexol, Nubain [nalbuphine hcl], Penicillins, Iodinated diagnostic agents, and Latex   Social History   Socioeconomic History  . Marital status: Married    Spouse name: Not on file  . Number of children: 1  . Years of education: Not on file  . Highest education level: Not on file  Occupational History  . Occupation: Training and development officer: [employer]  Tobacco Use  . Smoking status: Former Smoker    Packs/day: 1.00    Years: 22.00    Pack years: 22.00    Types: Cigarettes     Start date: 08/06/1981    Quit date: 01/05/2004    Years since quitting: 16.7  . Smokeless tobacco: Never Used  . Tobacco comment: Quit in 2005  Vaping Use  . Vaping Use: Never used  Substance and Sexual Activity  . Alcohol use: No    Alcohol/week: 0.0 standard drinks  . Drug use: No  . Sexual activity: Not on file    Comment: hyst  Other Topics Concern  . Not on file  Social History Narrative  . Not on file   Social Determinants of Health   Financial Resource Strain: Not on file  Food Insecurity: Not on file  Transportation Needs: Not on file  Physical Activity: Not on file  Stress: Not on file  Social Connections: Not on file     Family History:  The patient's family history includes Cancer in her brother; Heart attack in her brother and brother; Heart disease in her sister and sister; Hypertension in her daughter, sister, and sister; Other in her daughter; Thyroid disease in her daughter, sister, and sister; Ulcers in her brother and sister.   Review of Systems:   Please see the history of present illness.     General:  No chills, fever, night sweats or weight changes.  Cardiovascular:  No edema, orthopnea, palpitations, paroxysmal nocturnal dyspnea. Positive for chest pain and dyspnea on exertion.  Dermatological: No rash, lesions/masses Respiratory: No cough, dyspnea Urologic: No hematuria, dysuria Abdominal:   No nausea, vomiting, diarrhea, bright red blood per rectum, melena, or hematemesis Neurologic:  No visual changes, wkns, changes in mental status. All other systems reviewed and are otherwise negative except as noted above.   Physical Exam:    VS:  BP (!) 154/90   Pulse 70   Ht 5\' 5"  (1.651 m)   Wt 221 lb 6.4 oz (100.4 kg)   SpO2 94%  BMI 36.84 kg/m    General: Well developed, well nourished,female appearing in no acute distress. Head: Normocephalic, atraumatic. Neck: No carotid bruits. JVD not elevated.  Lungs: Respirations regular and unlabored,  without wheezes or rales.  Heart: Regular rate and rhythm. No S3 or S4.  No murmur, no rubs, or gallops appreciated. Abdomen: Appears non-distended. No obvious abdominal masses. Msk:  Strength and tone appear normal for age. No obvious joint deformities or effusions. Extremities: No clubbing or cyanosis. No lower extremity edema.  Distal pedal pulses are 2+ bilaterally. Neuro: Alert and oriented X 3. Moves all extremities spontaneously. No focal deficits noted. Psych:  Responds to questions appropriately with a normal affect. Skin: No rashes or lesions noted  Wt Readings from Last 3 Encounters:  10/13/20 221 lb 6.4 oz (100.4 kg)  09/01/20 224 lb 6.4 oz (101.8 kg)  12/02/19 218 lb (98.9 kg)     Studies/Labs Reviewed:   EKG:  EKG is not ordered today. EKG from 09/01/2020 is reviewed and shows NSR, HR 88 with PAC's and PVC's.   Recent Labs: 10/13/2020: BUN 15; Creatinine, Ser 0.85; Hemoglobin 11.7; Platelets 339; Potassium 4.3; Sodium 136   Lipid Panel    Component Value Date/Time   CHOL  05/14/2008 0500    180        ATP III CLASSIFICATION:  <200     mg/dL   Desirable  200-239  mg/dL   Borderline High  >=240    mg/dL   High   TRIG 296 (H) 05/14/2008 0500   HDL 41 05/14/2008 0500   CHOLHDL 4.4 05/14/2008 0500   VLDL 59 (H) 05/14/2008 0500   LDLCALC  05/14/2008 0500    80        Total Cholesterol/HDL:CHD Risk Coronary Heart Disease Risk Table                     Men   Women  1/2 Average Risk   3.4   3.3    Additional studies/ records that were reviewed today include:   Echocardiogram: 07/2017 Study Conclusions   - Left ventricle: The cavity size was normal. Wall thickness was  increased in a pattern of moderate LVH. Systolic function was  normal. The estimated ejection fraction was in the range of 55%  to 60%. Wall motion was normal; there were no regional wall  motion abnormalities. Doppler parameters are consistent with  abnormal left ventricular relaxation  (grade 1 diastolic  dysfunction).  - Aortic valve: There was mild regurgitation. Valve area (VTI):  1.92 cm^2. Valve area (Vmax): 1.91 cm^2. Valve area (Vmean): 1.78  cm^2.  - Technically adequate study.   NST: 07/2017  There was no ST segment deviation noted during stress.  Findings consistent with very mild apical ischemia verse differences in apical thinning. Either finding alone is low risk from a perfusion standpoint.  This is a high risk study. High risk based on decreased LVEF and elevated TID of 1.49. There is no evidence of significant ischemia by perfusion imaging itself, however with decreased LVEF and elevated TID there may be balanced ischemia. Recommend correlating LVEF with echo.  The left ventricular ejection fraction is moderately decreased (30-44%). LVEF is 32%     Assessment:    1. Accelerating angina ([place])   2. History of palpitations   3. Essential hypertension   4. Hyperlipidemia LDL goal <70      Plan:   In order of problems listed above:  1. Chest Pain Concerning  for Accelerating Angina - She continues to have episodes of chest pain and dyspnea on exertion which have persisted despite titration of medical therapy. Given her symptoms and prior abnormal NST in 2018, a cardiac catheterization was previously reviewed with the patient at her last visit with Dr. Domenic Polite but she did not wish to proceed at that time. She is now in agreement to proceed and we reviewed that she would be premedicated for her contrast allergy prior to the procedure. The patient understands that risks include but are not limited to stroke (1 in 1000), death (1 in 54), kidney failure [usually temporary] (1 in 500), bleeding (1 in 200), allergic reaction [possibly serious] (1 in 200).  Will try to arrange for next week. Will arrange for CBC and BMET along with pre-procedure COVID testing.  - Continue ASA 81 mg daily, Imdur 30 mg daily, Lopressor 50 mg twice daily and Simvastatin 20  mg daily. She has been experiencing a headache with Imdur but says she is tolerating at this time. Will continue until the time of her catheterization but can hopefully discontinue afterwards given her side effects. Reviewed she could reduce to 15mg  daily again if needed.   2. Palpitations -She did have PAC's and PVC's by prior EKG. Lopressor was recently titrated to 50 mg twice daily and will continue current dosing for now.  3. HTN - BP is elevated at 154/90 during today's visit and she reports this has been variable when checked at home.  Continue current regimen for now with Chlorthalidone 25 mg daily, Imdur 30 mg daily, Losartan 100 mg daily and Lopressor 50 mg twice daily. If BP remains above goal following her catheterization, would consider switching Lopressor to Coreg or adding Amlodipine.  4. HLD - Followed by PCP and will request most recent records. She remains on Simvastatin 20 mg daily.   Medication Adjustments/Labs and Tests Ordered: Current medicines are reviewed at length with the patient today.  Concerns regarding medicines are outlined above.  Medication changes, Labs and Tests ordered today are listed in the Patient Instructions below. Patient Instructions  Medication Instructions:  Your physician recommends that you continue on your current medications as directed. Please refer to the Current Medication list given to you today.  *If you need a refill on your cardiac medications before your next appointment, please call your pharmacy*   Lab Work: CBC BMET  If you have labs (blood work) drawn today and your tests are completely normal, you will receive your results only by: Marland Kitchen MyChart Message (if you have MyChart) OR . A paper copy in the mail If you have any lab test that is abnormal or we need to change your treatment, we will call you to review the results.   Testing/Procedures: Your physician has requested that you have a cardiac catheterization. Cardiac  catheterization is used to diagnose and/or treat various heart conditions. Doctors may recommend this procedure for a number of different reasons. The most common reason is to evaluate chest pain. Chest pain can be a symptom of coronary artery disease (CAD), and cardiac catheterization can show whether plaque is narrowing or blocking your heart's arteries. This procedure is also used to evaluate the valves, as well as measure the blood flow and oxygen levels in different parts of your heart. For further information please visit HugeFiesta.[email or web address]. Please follow instruction sheet, as given.     Follow-Up: At [facility], you and your health needs are our priority.  As part of our continuing mission  to provide you with exceptional heart care, we have created designated Provider Care Teams.  These Care Teams include your primary Cardiologist (physician) and Advanced Practice Providers (APPs -  Physician Assistants and Nurse Practitioners) who all work together to provide you with the care you need, when you need it.  We recommend signing up for the patient portal called "MyChart".  Sign up information is provided on this After Visit Summary.  MyChart is used to connect with patients for Virtual Visits (Telemedicine).  Patients are able to view lab/test results, encounter notes, upcoming appointments, etc.  Non-urgent messages can be sent to your provider as well.   To learn more about what you can do with MyChart, go to [email or web address].    Your next appointment:   3-4 week(s)  The format for your next appointment:   In Person  Provider:   You may see Rozann Lesches, MD or one of the following Advanced Practice Providers on your designated Care Team:   Bernerd Pho, [place]     Other [place] [address] Dept: [phone] Loc: [place]  10/13/2020  You are scheduled for a Cardiac Catheterization on Wednesday, March 16 with Dr. Daneen Schick.  1. Please arrive at the [facility] (Main Entrance A) at [facility]: [address] [place], [place] [address] at 6:30 AM (This time is two hours before your procedure to ensure your preparation). Free valet parking service is available.   Special note: Every effort is made to have your procedure done on time. Please understand that emergencies sometimes delay scheduled procedures.  2. Diet: Do not eat solid foods after midnight.  The patient may have clear liquids until 5am upon the day of the procedure.  3. Labs: You will need to have blood drawn on Thursday, March 10 at [facility] [address], [place]  Open: 7am - 6pm, Sat 8am - 12 noon   Phone: [phone]. You do not need to be fasting.  4. Medication instructions in preparation for your procedure:   Contrast Allergy: Yes, Please take Prednisone 50mg  by mouth at: Thirteen hours prior to cath 7:00pm on Tuesday Seven hours prior to cath 1:00am on Wednesday And prior to leaving home please take last dose of Prednisone 50mg  and Benadryl 50mg  by mouth.  On the morning of your procedure, take your Aspirin and any morning medicines NOT listed above.  You may use sips of water.  5. Plan for one night stay--bring personal belongings. 6. Bring a current list of your medications and current insurance cards. 7. You MUST have a responsible person to drive you home. 8. Someone MUST be with you the first 24 hours after you arrive home or your discharge will be delayed. 9. Please wear clothes that are easy to get on and off and wear slip-on shoes.  Thank you for allowing [place] to care for you!   -- [facility]      Signed, Erma Heritage, Hershal Coria  10/13/2020 5:30 PM    [place]. [address] [place], [place] [address] Phone: [phone] Fax:  [phone]

## 2020-10-13 ENCOUNTER — Other Ambulatory Visit (HOSPITAL_COMMUNITY)
Admission: RE | Admit: 2020-10-13 | Discharge: 2020-10-13 | Disposition: A | Discharge status: Home / Self Care | Payer: BC Managed Care – PPO | Source: Ambulatory Visit | Attending: Student | Admitting: Student

## 2020-10-13 ENCOUNTER — Other Ambulatory Visit: Payer: Self-pay

## 2020-10-13 ENCOUNTER — Encounter: Payer: Self-pay | Admitting: Student

## 2020-10-13 ENCOUNTER — Ambulatory Visit: Payer: BC Managed Care – PPO | Admitting: Student

## 2020-10-13 VITALS — BP 154/90 | HR 70 | Ht 65.0 in | Wt 221.4 lb

## 2020-10-13 DIAGNOSIS — I200 Unstable angina: Secondary | ICD-10-CM | POA: Insufficient documentation

## 2020-10-13 DIAGNOSIS — I10 Essential (primary) hypertension: Secondary | ICD-10-CM | POA: Diagnosis not present

## 2020-10-13 DIAGNOSIS — Z87898 Personal history of other specified conditions: Secondary | ICD-10-CM

## 2020-10-13 DIAGNOSIS — E785 Hyperlipidemia, unspecified: Secondary | ICD-10-CM

## 2020-10-13 LAB — BASIC METABOLIC PANEL
Anion gap: 10 (ref 5–15)
BUN: 15 mg/dL (ref 8–23)
CO2: 25 mmol/L (ref 22–32)
Calcium: 9.7 mg/dL (ref 8.9–10.3)
Chloride: 101 mmol/L (ref 98–111)
Creatinine, Ser: 0.85 mg/dL (ref 0.44–1.00)
GFR, Estimated: >60 mL/min (ref >60)
Glucose, Bld: 90 mg/dL (ref 70–99)
Potassium: 4.3 mmol/L (ref 3.5–5.1)
Sodium: 136 mmol/L (ref 135–145)

## 2020-10-13 LAB — CBC
HCT: 39 % (ref 36.0–46.0)
Hemoglobin: 11.7 g/dL — ABNORMAL LOW (ref 12.0–15.0)
MCH: 23.1 pg — ABNORMAL LOW (ref 26.0–34.0)
MCHC: 30 g/dL (ref 30.0–36.0)
MCV: 77.1 fL — ABNORMAL LOW (ref 80.0–100.0)
Platelets: 339 10*3/uL (ref 150–400)
RBC: 5.06 MIL/uL (ref 3.87–5.11)
RDW: 17.4 % — ABNORMAL HIGH (ref 11.5–15.5)
WBC: 8.6 10*3/uL (ref 4.0–10.5)
nRBC: 0 % (ref 0.0–0.2)

## 2020-10-13 MED ORDER — PREDNISONE 50 MG PO TABS
ORAL_TABLET | ORAL | 0 refills | Status: DC
Start: 2020-10-13 — End: 2020-12-14

## 2020-10-13 NOTE — Patient Instructions (Signed)
Medication Instructions:  Your physician recommends that you continue on your current medications as directed. Please refer to the Current Medication list given to you today.  *If you need a refill on your cardiac medications before your next appointment, please call your pharmacy*   Lab Work: CBC BMET  If you have labs (blood work) drawn today and your tests are completely normal, you will receive your results only by: Marland Kitchen MyChart Message (if you have MyChart) OR . A paper copy in the mail If you have any lab test that is abnormal or we need to change your treatment, we will call you to review the results.   Testing/Procedures: Your physician has requested that you have a cardiac catheterization. Cardiac catheterization is used to diagnose and/or treat various heart conditions. Doctors may recommend this procedure for a number of different reasons. The most common reason is to evaluate chest pain. Chest pain can be a symptom of coronary artery disease (CAD), and cardiac catheterization can show whether plaque is narrowing or blocking your heart's arteries. This procedure is also used to evaluate the valves, as well as measure the blood flow and oxygen levels in different parts of your heart. For further information please visit HugeFiesta.tn. Please follow instruction sheet, as given.     Follow-Up: At [facility], you and your health needs are our priority.  As part of our continuing mission to provide you with exceptional heart care, we have created designated Provider Care Teams.  These Care Teams include your primary Cardiologist (physician) and Advanced Practice Providers (APPs -  Physician Assistants and Nurse Practitioners) who all work together to provide you with the care you need, when you need it.  We recommend signing up for the patient portal called "MyChart".  Sign up information is provided on this After Visit Summary.  MyChart is used to connect with patients for  Virtual Visits (Telemedicine).  Patients are able to view lab/test results, encounter notes, upcoming appointments, etc.  Non-urgent messages can be sent to your provider as well.   To learn more about what you can do with MyChart, go to [email or web address].    Your next appointment:   3-4 week(s)  The format for your next appointment:   In Person  Provider:   You may see Rozann Lesches, MD or one of the following Advanced Practice Providers on your designated Care Team:   Bernerd Pho, [place]     Other [place] [address] Dept: [phone] Loc: [place]  10/13/2020  You are scheduled for a Cardiac Catheterization on Wednesday, March 16 with Dr. Daneen Schick.  1. Please arrive at the [facility] (Main Entrance A) at [facility]: [address] [place], [place] [address] at 6:30 AM (This time is two hours before your procedure to ensure your preparation). Free valet parking service is available.   Special note: Every effort is made to have your procedure done on time. Please understand that emergencies sometimes delay scheduled procedures.  2. Diet: Do not eat solid foods after midnight.  The patient may have clear liquids until 5am upon the day of the procedure.  3. Labs: You will need to have blood drawn on Thursday, March 10 at [place] [address], [place]  Open: 7am - 6pm, Sat 8am - 12 noon   Phone: [phone]. You do not need to  be fasting.  4. Medication instructions in preparation for your procedure:   Contrast Allergy: Yes, Please take Prednisone 50mg  by mouth at: Thirteen hours prior to cath 7:00pm on Tuesday Seven hours prior to cath 1:00am on Wednesday And prior to leaving home please take last dose of Prednisone 50mg  and Benadryl 50mg  by mouth.  On the morning of your procedure, take your  Aspirin and any morning medicines NOT listed above.  You may use sips of water.  5. Plan for one night stay--bring personal belongings. 6. Bring a current list of your medications and current insurance cards. 7. You MUST have a responsible person to drive you home. 8. Someone MUST be with you the first 24 hours after you arrive home or your discharge will be delayed. 9. Please wear clothes that are easy to get on and off and wear slip-on shoes.  Thank you for allowing [place] to care for you!   -- [place] [facility]

## 2020-10-14 ENCOUNTER — Telehealth: Payer: Self-pay

## 2020-10-14 NOTE — Telephone Encounter (Signed)
Patient verbalized understanding of results. No questions or concerns at this time. Will continue with plans for heart cath next week and follow up with PCP

## 2020-10-14 NOTE — Telephone Encounter (Signed)
-----   Message from Erma Heritage, [place] sent at 10/13/2020  7:09 PM EST ----- Please let the patient know her electrolytes and kidney function remain stable. She does have a mild anemia with Hgb at 11.7 but this is similar to prior values. Would recommend she follow-up with her PCP at some point in regards to this as she may need an iron panel or further work-up. Platelets within a normal range. Continue with plans for catheterization next week.

## 2020-10-17 ENCOUNTER — Other Ambulatory Visit (HOSPITAL_COMMUNITY)
Admission: RE | Admit: 2020-10-17 | Discharge: 2020-10-17 | Disposition: A | Discharge status: Home / Self Care | Payer: BC Managed Care – PPO | Source: Ambulatory Visit | Attending: Interventional Cardiology | Admitting: Interventional Cardiology

## 2020-10-17 DIAGNOSIS — Z79899 Other long term (current) drug therapy: Secondary | ICD-10-CM | POA: Diagnosis not present

## 2020-10-17 DIAGNOSIS — Z91041 Radiographic dye allergy status: Secondary | ICD-10-CM | POA: Diagnosis not present

## 2020-10-17 DIAGNOSIS — Z87891 Personal history of nicotine dependence: Secondary | ICD-10-CM | POA: Diagnosis not present

## 2020-10-17 DIAGNOSIS — Z7982 Long term (current) use of aspirin: Secondary | ICD-10-CM | POA: Diagnosis not present

## 2020-10-17 DIAGNOSIS — Z880 Allergy status to penicillin: Secondary | ICD-10-CM | POA: Diagnosis not present

## 2020-10-17 DIAGNOSIS — I200 Unstable angina: Secondary | ICD-10-CM | POA: Diagnosis present

## 2020-10-17 DIAGNOSIS — Z79890 Hormone replacement therapy (postmenopausal): Secondary | ICD-10-CM | POA: Diagnosis not present

## 2020-10-17 DIAGNOSIS — R002 Palpitations: Secondary | ICD-10-CM | POA: Diagnosis not present

## 2020-10-17 DIAGNOSIS — I10 Essential (primary) hypertension: Secondary | ICD-10-CM | POA: Diagnosis not present

## 2020-10-17 DIAGNOSIS — E785 Hyperlipidemia, unspecified: Secondary | ICD-10-CM | POA: Diagnosis not present

## 2020-10-17 DIAGNOSIS — Z20822 Contact with and (suspected) exposure to covid-19: Secondary | ICD-10-CM | POA: Insufficient documentation

## 2020-10-17 DIAGNOSIS — Z01812 Encounter for preprocedural laboratory examination: Secondary | ICD-10-CM | POA: Insufficient documentation

## 2020-10-17 DIAGNOSIS — Z91040 Latex allergy status: Secondary | ICD-10-CM | POA: Diagnosis not present

## 2020-10-17 DIAGNOSIS — Z888 Allergy status to other drugs, medicaments and biological substances status: Secondary | ICD-10-CM | POA: Diagnosis not present

## 2020-10-17 LAB — SARS CORONAVIRUS 2 (TAT 6-24 HRS): SARS Coronavirus 2: NEGATIVE

## 2020-10-18 ENCOUNTER — Telehealth: Payer: Self-pay | Admitting: *Deleted

## 2020-10-18 NOTE — Telephone Encounter (Addendum)
Pt contacted pre-catheterization scheduled at [facility] for: Wednesday March 16,2022 8:30 AM Verified arrival time and place: [place] [facility]) at: 6:30 AM   No solid food after midnight prior to cath, clear liquids until 5 AM day of procedure.  CONTRAST ALLERGY: yes- 13 hour Prednisone and Benadryl Prep: 10/18/20 Prednisone 50 mg 7:30 PM 10/19/20 Prednisone 50 mg 1:30 AM 10/19/20 Prednisone 50 mg and Benadryl 50 mg-just prior to leaving home for hospital AM of procedure  Hold: Chlorthalidone/KCl -AM of procedure  Except hold medications AM meds can be  taken pre-cath with sips of water including: ASA 81 mg Prednisone 50 mg Benadryl 50 mg  Confirmed patient has responsible adult to drive home post procedure and be with patient first 24 hours after arriving home: yes  You are allowed ONE visitor in the waiting room during the time you are at the hospital for your procedure. Both you and your visitor must wear a mask once you enter the hospital.    Reviewed procedure/mask/visitor instructions with patient.

## 2020-10-18 NOTE — H&P (Signed)
Prior normal cath 2005 Abn Nuclear  2018 CA Ca++ Contrast allergy

## 2020-10-19 ENCOUNTER — Encounter (HOSPITAL_COMMUNITY): Payer: Self-pay | Admitting: Interventional Cardiology

## 2020-10-19 ENCOUNTER — Ambulatory Visit (HOSPITAL_COMMUNITY)
Admission: RE | Admit: 2020-10-19 | Discharge: 2020-10-19 | Disposition: A | Discharge status: Home / Self Care | Payer: BC Managed Care – PPO | Attending: Interventional Cardiology | Admitting: Interventional Cardiology

## 2020-10-19 ENCOUNTER — Encounter (HOSPITAL_COMMUNITY)
Admission: RE | Disposition: A | Discharge status: Home / Self Care | Payer: Self-pay | Source: Home / Self Care | Attending: Interventional Cardiology

## 2020-10-19 DIAGNOSIS — Z91041 Radiographic dye allergy status: Secondary | ICD-10-CM | POA: Insufficient documentation

## 2020-10-19 DIAGNOSIS — R002 Palpitations: Secondary | ICD-10-CM | POA: Diagnosis not present

## 2020-10-19 DIAGNOSIS — Z888 Allergy status to other drugs, medicaments and biological substances status: Secondary | ICD-10-CM | POA: Insufficient documentation

## 2020-10-19 DIAGNOSIS — R931 Abnormal findings on diagnostic imaging of heart and coronary circulation: Secondary | ICD-10-CM

## 2020-10-19 DIAGNOSIS — Z87891 Personal history of nicotine dependence: Secondary | ICD-10-CM | POA: Insufficient documentation

## 2020-10-19 DIAGNOSIS — Z91040 Latex allergy status: Secondary | ICD-10-CM | POA: Insufficient documentation

## 2020-10-19 DIAGNOSIS — R079 Chest pain, unspecified: Secondary | ICD-10-CM | POA: Diagnosis not present

## 2020-10-19 DIAGNOSIS — I10 Essential (primary) hypertension: Secondary | ICD-10-CM | POA: Diagnosis not present

## 2020-10-19 DIAGNOSIS — E785 Hyperlipidemia, unspecified: Secondary | ICD-10-CM | POA: Diagnosis not present

## 2020-10-19 DIAGNOSIS — Z7982 Long term (current) use of aspirin: Secondary | ICD-10-CM | POA: Insufficient documentation

## 2020-10-19 DIAGNOSIS — I2510 Atherosclerotic heart disease of native coronary artery without angina pectoris: Secondary | ICD-10-CM | POA: Diagnosis present

## 2020-10-19 DIAGNOSIS — I200 Unstable angina: Secondary | ICD-10-CM | POA: Diagnosis not present

## 2020-10-19 DIAGNOSIS — Z20822 Contact with and (suspected) exposure to covid-19: Secondary | ICD-10-CM | POA: Insufficient documentation

## 2020-10-19 DIAGNOSIS — Z79899 Other long term (current) drug therapy: Secondary | ICD-10-CM | POA: Insufficient documentation

## 2020-10-19 DIAGNOSIS — Z79890 Hormone replacement therapy (postmenopausal): Secondary | ICD-10-CM | POA: Insufficient documentation

## 2020-10-19 DIAGNOSIS — Z880 Allergy status to penicillin: Secondary | ICD-10-CM | POA: Insufficient documentation

## 2020-10-19 HISTORY — PX: LEFT HEART CATH AND CORONARY ANGIOGRAPHY: CATH118249

## 2020-10-19 SURGERY — LEFT HEART CATH AND CORONARY ANGIOGRAPHY
Anesthesia: LOCAL

## 2020-10-19 MED ORDER — HEPARIN (PORCINE) IN NACL 1000-0.9 UT/500ML-% IV SOLN
INTRAVENOUS | Status: DC | PRN
  Administered 2020-10-19: 500 mL

## 2020-10-19 MED ORDER — SODIUM CHLORIDE 0.9% FLUSH: 3.0000 mL | Freq: Two times a day (BID) | INTRAVENOUS | Status: DC

## 2020-10-19 MED ORDER — LIDOCAINE HCL (PF) 1 % IJ SOLN
INTRAMUSCULAR | Status: DC | PRN
  Administered 2020-10-19: 2 mL

## 2020-10-19 MED ORDER — HEPARIN SODIUM (PORCINE) 1000 UNIT/ML IJ SOLN
INTRAMUSCULAR | Status: AC
  Filled 2020-10-19: qty 1

## 2020-10-19 MED ORDER — ONDANSETRON HCL 4 MG/2ML IJ SOLN: 4.0000 mg | Freq: Four times a day (QID) | INTRAMUSCULAR | Status: DC | PRN

## 2020-10-19 MED ORDER — HYDRALAZINE HCL 20 MG/ML IJ SOLN: 10.0000 mg | INTRAMUSCULAR | Status: DC | PRN

## 2020-10-19 MED ORDER — HEPARIN SODIUM (PORCINE) 1000 UNIT/ML IJ SOLN
INTRAMUSCULAR | Status: DC | PRN
  Administered 2020-10-19: 4500 [IU] via INTRAVENOUS

## 2020-10-19 MED ORDER — SODIUM CHLORIDE 0.9 % IV SOLN: 250.0000 mL | INTRAVENOUS | Status: DC | PRN

## 2020-10-19 MED ORDER — SODIUM CHLORIDE 0.9 % IV SOLN: INTRAVENOUS | Status: DC

## 2020-10-19 MED ORDER — SODIUM CHLORIDE 0.9 % WEIGHT BASED INFUSION
3.0000 mL/kg/h | INTRAVENOUS | Status: AC
  Administered 2020-10-19: 3 mL/kg/h via INTRAVENOUS

## 2020-10-19 MED ORDER — HEPARIN (PORCINE) IN NACL 1000-0.9 UT/500ML-% IV SOLN
INTRAVENOUS | Status: AC
  Filled 2020-10-19: qty 1000

## 2020-10-19 MED ORDER — FENTANYL CITRATE (PF) 100 MCG/2ML IJ SOLN
INTRAMUSCULAR | Status: AC
  Filled 2020-10-19: qty 2

## 2020-10-19 MED ORDER — ACETAMINOPHEN 325 MG PO TABS: 650.0000 mg | ORAL_TABLET | ORAL | Status: DC | PRN

## 2020-10-19 MED ORDER — SODIUM CHLORIDE 0.9% FLUSH: 3.0000 mL | INTRAVENOUS | Status: DC | PRN

## 2020-10-19 MED ORDER — MIDAZOLAM HCL 2 MG/2ML IJ SOLN
INTRAMUSCULAR | Status: AC
  Filled 2020-10-19: qty 2

## 2020-10-19 MED ORDER — VERAPAMIL HCL 2.5 MG/ML IV SOLN
INTRAVENOUS | Status: DC | PRN
  Administered 2020-10-19: 10 mL via INTRA_ARTERIAL

## 2020-10-19 MED ORDER — VERAPAMIL HCL 2.5 MG/ML IV SOLN
INTRAVENOUS | Status: AC
  Filled 2020-10-19: qty 2

## 2020-10-19 MED ORDER — LABETALOL HCL 5 MG/ML IV SOLN: 10.0000 mg | INTRAVENOUS | Status: DC | PRN

## 2020-10-19 MED ORDER — FENTANYL CITRATE (PF) 100 MCG/2ML IJ SOLN
INTRAMUSCULAR | Status: DC | PRN
  Administered 2020-10-19: 50 ug via INTRAVENOUS

## 2020-10-19 MED ORDER — LIDOCAINE HCL (PF) 1 % IJ SOLN
INTRAMUSCULAR | Status: AC
  Filled 2020-10-19: qty 30

## 2020-10-19 MED ORDER — IOHEXOL 350 MG/ML SOLN
INTRAVENOUS | Status: DC | PRN
  Administered 2020-10-19: 55 mL

## 2020-10-19 MED ORDER — MIDAZOLAM HCL 2 MG/2ML IJ SOLN
INTRAMUSCULAR | Status: DC | PRN
  Administered 2020-10-19: 1 mg via INTRAVENOUS

## 2020-10-19 MED ORDER — SODIUM CHLORIDE 0.9 % WEIGHT BASED INFUSION: 1.0000 mL/kg/h | INTRAVENOUS | Status: DC

## 2020-10-19 MED ORDER — ASPIRIN 81 MG PO CHEW: 81.0000 mg | CHEWABLE_TABLET | ORAL | Status: DC

## 2020-10-19 SURGICAL SUPPLY — 11 items
CATH 5FR JL3.5 JR4 ANG PIG MP (CATHETERS) ×1 IMPLANT
DEVICE RAD COMP TR BAND LRG (VASCULAR PRODUCTS) ×1 IMPLANT
GLIDESHEATH SLEND A-KIT 6F 22G (SHEATH) ×1 IMPLANT
GUIDEWIRE INQWIRE 1.5J.035X260 (WIRE) IMPLANT
INQWIRE 1.5J .035X260CM (WIRE) ×2
KIT HEART LEFT (KITS) ×2 IMPLANT
PACK CARDIAC CATHETERIZATION (CUSTOM PROCEDURE TRAY) ×2 IMPLANT
SHEATH PROBE COVER 6X72 (BAG) ×1 IMPLANT
TRANSDUCER W/STOPCOCK (MISCELLANEOUS) ×2 IMPLANT
TUBING CIL FLEX 10 FLL-RA (TUBING) ×2 IMPLANT
WIRE HI TORQ VERSACORE-J 145CM (WIRE) ×1 IMPLANT

## 2020-10-19 NOTE — CV Procedure (Signed)
   Normal coronary arteries.  Normal LVEDP.

## 2020-10-19 NOTE — Discharge Instructions (Signed)
Radial Site Care  This sheet gives you information about how to care for yourself after your procedure. Your health care provider may also give you more specific instructions. If you have problems or questions, contact your health care provider. What can I expect after the procedure? After the procedure, it is common to have:  Bruising and tenderness at the catheter insertion area. Follow these instructions at home: Medicines  Take over-the-counter and prescription medicines only as told by your health care provider. Insertion site care  Follow instructions from your health care provider about how to take care of your insertion site. Make sure you: ? Wash your hands with soap and water before you change your bandage (dressing). If soap and water are not available, use hand sanitizer. ? Change your dressing as told by your health care provider. ? Leave stitches (sutures), skin glue, or adhesive strips in place. These skin closures may need to stay in place for 2 weeks or longer. If adhesive strip edges start to loosen and curl up, you may trim the loose edges. Do not remove adhesive strips completely unless your health care provider tells you to do that.  Check your insertion site every day for signs of infection. Check for: ? Redness, swelling, or pain. ? Fluid or blood. ? Pus or a bad smell. ? Warmth.  Do not take baths, swim, or use a hot tub until your health care provider approves.  You may shower 24-48 hours after the procedure, or as directed by your health care provider. ? Remove the dressing and gently wash the site with plain soap and water. ? Pat the area dry with a clean towel. ? Do not rub the site. That could cause bleeding.  Do not apply powder or lotion to the site. Activity  For 24 hours after the procedure, or as directed by your health care provider: ? Do not flex or bend the affected arm. ? Do not push or pull heavy objects with the affected arm. ? Do not drive  yourself home from the hospital or clinic. You may drive 24 hours after the procedure unless your health care provider tells you not to. ? Do not operate machinery or power tools.  Do not lift anything that is heavier than 10 lb (4.5 kg), or the limit that you are told, until your health care provider says that it is safe.  Ask your health care provider when it is okay to: ? Return to work or school. ? Resume usual physical activities or sports. ? Resume sexual activity.   General instructions  If the catheter site starts to bleed, raise your arm and put firm pressure on the site. If the bleeding does not stop, get help right away. This is a medical emergency.  If you went home on the same day as your procedure, a responsible adult should be with you for the first 24 hours after you arrive home.  Keep all follow-up visits as told by your health care provider. This is important. Contact a health care provider if:  You have a fever.  You have redness, swelling, or yellow drainage around your insertion site. Get help right away if:  You have unusual pain at the radial site.  The catheter insertion area swells very fast.  The insertion area is bleeding, and the bleeding does not stop when you hold steady pressure on the area.  Your arm or hand becomes pale, cool, tingly, or numb. These symptoms may represent a serious   problem that is an emergency. Do not wait to see if the symptoms will go away. Get medical help right away. Call your local emergency services (911 in the [place] Do not drive yourself to the hospital. Summary  After the procedure, it is common to have bruising and tenderness at the site.  Follow instructions from your health care provider about how to take care of your radial site wound. Check the wound every day for signs of infection.  Do not lift anything that is heavier than 10 lb (4.5 kg), or the limit that you are told, until your health care provider says that it  is safe. This information is not intended to replace advice given to you by your health care provider. Make sure you discuss any questions you have with your health care provider. Document Revised: 08/28/2017 Document Reviewed: 08/28/2017 Elsevier Patient Education  2021 Elsevier Inc.  

## 2020-10-19 NOTE — Interval H&P Note (Signed)
Cath Lab Visit (complete for each Cath Lab visit)  Clinical Evaluation Leading to the Procedure:   ACS: No.  Non-ACS:    Anginal Classification: CCS III  Anti-ischemic medical therapy: Minimal Therapy (1 class of medications)  Non-Invasive Test Results: Intermediate-risk stress test findings: cardiac mortality 1-3%/year  Prior CABG: No previous CABG      History and Physical Interval Note:  10/19/2020 8:35 AM  Selena [name]  has presented today for surgery, with the diagnosis of unstable angina.  The various methods of treatment have been discussed with the patient and family. After consideration of risks, benefits and other options for treatment, the patient has consented to  Procedure(s): LEFT HEART CATH AND CORONARY ANGIOGRAPHY (N/A) as a surgical intervention.  The patient's history has been reviewed, patient examined, no change in status, stable for surgery.  I have reviewed the patient's chart and labs.  Questions were answered to the patient's satisfaction.     Belva Crome III

## 2020-11-07 ENCOUNTER — Encounter: Payer: Self-pay | Admitting: Internal Medicine

## 2020-11-08 NOTE — Progress Notes (Deleted)
Cardiology Office Note    Date:  11/08/2020   ID:  Selena [name], DOB [birth year], MRN [id number]  PCP:  Asencion Noble, MD  Cardiologist: Rozann Lesches, MD    No chief complaint on file.   History of Present Illness:    Selena [name] is a 70 y.o. female with past medical history of chest pain (normal cors by cath in 2005, NST in 2018 showing mild apical ischemia but increased TID ratio which could reflect balanced multivessel ischemia), palpitations (PVC's by prior monitor), HTN, HLD and GERD who presents to the office today for follow-up from her recent cardiac catheterization.  She was examined by Dr. Domenic Polite in 08/2020 and reported chest tightness on exertion and a catheterization was reviewed with the patient and she declined. Was started on Imdur 15mg  daily and Lopressor was increased to 50mg  BID but she reported continued symptoms at the time of her follow-up visit on 10/13/2020. Given her persistent symptoms, a cardiac catheterization was recommended for definitive evaluation. This was performed by Dr. Tamala Julian on 10/19/2020 and showed normal cord with normal LVEDP.     Past Medical History:  Diagnosis Date  . Abnormal findings on imaging of biliary tract 1997-2000   CBD 51mm, Dr Albin Felling biliary/pancreatic manometry, EUS Dr. Ardis Hughs 09/18/07 dilated but otherwise normal extrahepatic duct, no masses  . Arthritis   . Asthma   . Bursitis   . Cervical ca ([place]) 1984  . Chronic abdominal pain    Functional, seen @[facility] , extensive WU here benign   . Chronic back pain    On methadone  . Constipation   . COPD (chronic obstructive pulmonary disease) ([place])   . Depression    w/ menopause  . Essential hypertension   . GERD (gastroesophageal reflux disease)   . H/O hiatal hernia   . Headache    Hx of migraines in past  . Hemorrhoids   . Hyperlipemia   . Hypothyroidism   . MI (myocardial infarction) ([place]) 2000   Details not clear  . Palpitations    PVCs  . Pneumonia   . 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 PUD (peptic ulcer disease) 1997/1998  . S/P colonoscopy 09/10/07   Dr. Donald Pore sec to tortuous colon, followed by [facility] & flex sig normal  . Skin cancer    Nose  . TIA (transient ischemic attack) 1983    Past Surgical History:  Procedure Laterality Date  . ABDOMINAL EXPOSURE N/A 10/05/2019   Procedure: ABDOMINAL EXPOSURE;  Surgeon: [name], MD;  Location: [facility];  Service: Vascular;  Laterality: N/A;  . ABDOMINAL HYSTERECTOMY    . ANTERIOR LUMBAR FUSION N/A 10/05/2019   Procedure: Anterior Lumbar Interbody Fusion - Lumbar Five-Sacral One;  Surgeon: Kary Kos, MD;  Location: [facility];  Service: Neurosurgery;  Laterality: N/A;  Anterior Lumbar Interbody Fusion - Lumbar Five-Sacral One  . APPENDECTOMY    . arthroscopic knee    . BACK SURGERY    . CARDIAC CATHETERIZATION  2005  . CATARACT EXTRACTION W/PHACO Right 09/02/2014   Procedure: CATARACT EXTRACTION PHACO AND INTRAOCULAR LENS PLACEMENT RIGHT EYE;  Surgeon: Tonny Branch, MD;  Location: [facility];  Service: Ophthalmology;  Laterality: Right;  CDE:8.96  . CATARACT EXTRACTION W/PHACO Left 09/13/2014   Procedure: CATARACT EXTRACTION PHACO AND INTRAOCULAR LENS PLACEMENT LEFT EYE;  Surgeon: Tonny Branch, MD;  Location: [facility];  Service: Ophthalmology;  Laterality: Left;  CDE 6.20  . CHOLECYSTECTOMY    . EYE SURGERY  Bilateral    cataract removal  . INGUINAL HERNIA REPAIR     left  . JOINT REPLACEMENT Bilateral    knees  . LEFT HEART CATH AND CORONARY ANGIOGRAPHY N/A 10/19/2020   Procedure: LEFT HEART CATH AND CORONARY ANGIOGRAPHY;  Surgeon: Belva Crome, MD;  Location: [facility];  Service: Cardiovascular;  Laterality: N/A;  . PARTIAL HYSTERECTOMY    . right arm     nerve  . TONSILLECTOMY    . TONSILLECTOMY    . TOTAL KNEE ARTHROPLASTY  06/20/2012   Procedure: TOTAL KNEE ARTHROPLASTY;  Surgeon: Yvette Rack., MD;  Location: [facility];  Service: Orthopedics;  Laterality:  Right;  . TOTAL KNEE ARTHROPLASTY Left 04/02/2014   Procedure: TOTAL KNEE ARTHROPLASTY;  Surgeon: Yvette Rack., MD;  Location: [facility];  Service: Orthopedics;  Laterality: Left;    Current Medications: Outpatient Medications Prior to Visit  Medication Sig Dispense Refill  . albuterol (PROVENTIL HFA;VENTOLIN HFA) 108 (90 Base) MCG/ACT inhaler Inhale 2 puffs into the lungs every 4 (four) hours as needed for wheezing or shortness of breath. 1 Inhaler 0  . aspirin EC 81 MG tablet Take 1 tablet (81 mg total) by mouth daily. 90 tablet 3  . bisacodyl (DULCOLAX) 5 MG EC tablet Take 5 mg by mouth daily as needed for moderate constipation.    . chlorthalidone (HYGROTON) 25 MG tablet Take 25 mg by mouth daily.    . clobetasol (TEMOVATE) 0.05 % external solution Apply 1 application topically 2 (two) times daily.  3  . cyclobenzaprine (FLEXERIL) 10 MG tablet Take 1 tablet (10 mg total) by mouth 3 (three) times daily as needed for muscle spasms. (Patient taking differently: Take 10 mg by mouth 3 (three) times daily.) 30 tablet 0  . desonide (DESOWEN) 0.05 % cream Apply 1 application topically 2 (two) times daily as needed (for psoriasis on skin).   3  . diclofenac sodium (VOLTAREN) 1 % GEL Apply 2 g topically 3 (three) times daily as needed (for knee pain/arthritis).    Marland Kitchen diphenhydrAMINE (BENADRYL) 25 MG tablet Take 25 mg by mouth every 6 (six) hours as needed for allergies.    Marland Kitchen ELDERBERRY PO Take 2 tablets by mouth daily.    Marland Kitchen KLOR-CON M10 10 MEQ tablet Take 20 mEq by mouth 2 (two) times daily.    Marland Kitchen levothyroxine (SYNTHROID) 137 MCG tablet Take 137 mcg by mouth daily before breakfast.    . losartan (COZAAR) 100 MG tablet Take 100 mg by mouth daily.    . magnesium hydroxide (MILK OF MAGNESIA) 400 MG/5ML suspension Take 15 mLs by mouth daily as needed for mild constipation, indigestion or heartburn.    . metoprolol tartrate (LOPRESSOR) 50 MG tablet Take 1 tablet (50 mg total) by mouth 2 (two) times daily.  180 tablet 3  . morphine (MS CONTIN) 30 MG 12 hr tablet Take 30 mg by mouth every 12 (twelve) hours.    Marland Kitchen morphine (MSIR) 15 MG tablet Take 30 mg by mouth 3 (three) times daily as needed for severe pain.     Marland Kitchen MOVANTIK 25 MG TABS tablet Take 25 mg by mouth daily.     . nitroGLYCERIN (NITROSTAT) 0.4 MG SL tablet Place 0.4 mg under the tongue every 5 (five) minutes as needed for chest pain.    . pantoprazole (PROTONIX) 40 MG tablet Take 1 tablet (40 mg total) by mouth 2 (two) times daily before a meal. 180 tablet 3  .  polyethylene glycol (MIRALAX / GLYCOLAX) 17 g packet Take 17 g by mouth daily as needed.    . predniSONE (DELTASONE) 50 MG tablet Take 1 tablet 13 hours prior to cath, Take 1 tablet 7 hours prior to cath, Take 1 tablet before leaving home for cath. 3 tablet 0  . RELISTOR 150 MG TABS Take 450 mg by mouth every morning.    . simvastatin (ZOCOR) 20 MG tablet Take 20 mg by mouth at bedtime.     Marland Kitchen zolpidem (AMBIEN) 5 MG tablet Take 5 mg by mouth at bedtime as needed for sleep.      No facility-administered medications prior to visit.     Allergies:   Iohexol, Nubain [nalbuphine hcl], Penicillins, Iodinated diagnostic agents, and Latex   Social History   Socioeconomic History  . Marital status: Married    Spouse name: Not on file  . Number of children: 1  . Years of education: Not on file  . Highest education level: Not on file  Occupational History  . Occupation: Training and development officer: [employer]  Tobacco Use  . Smoking status: Former Smoker    Packs/day: 1.00    Years: 22.00    Pack years: 22.00    Types: Cigarettes    Start date: 08/06/1981    Quit date: 01/05/2004    Years since quitting: 16.8  . Smokeless tobacco: Never Used  . Tobacco comment: Quit in 2005  Vaping Use  . Vaping Use: Never used  Substance and Sexual Activity  . Alcohol use: No    Alcohol/week: 0.0 standard drinks  . Drug use: No  . Sexual activity: Not on file    Comment: hyst  Other  Topics Concern  . Not on file  Social History Narrative  . Not on file   Social Determinants of Health   Financial Resource Strain: Not on file  Food Insecurity: Not on file  Transportation Needs: Not on file  Physical Activity: Not on file  Stress: Not on file  Social Connections: Not on file     Family History:  The patient's ***family history includes Cancer in her brother; Heart attack in her brother and brother; Heart disease in her sister and sister; Hypertension in her daughter, sister, and sister; Other in her daughter; Thyroid disease in her daughter, sister, and sister; Ulcers in her brother and sister.   Review of Systems:   Please see the history of present illness.     General:  No chills, fever, night sweats or weight changes.  Cardiovascular:  No chest pain, dyspnea on exertion, edema, orthopnea, palpitations, paroxysmal nocturnal dyspnea. Dermatological: No rash, lesions/masses Respiratory: No cough, dyspnea Urologic: No hematuria, dysuria Abdominal:   No nausea, vomiting, diarrhea, bright red blood per rectum, melena, or hematemesis Neurologic:  No visual changes, wkns, changes in mental status. All other systems reviewed and are otherwise negative except as noted above.   Physical Exam:    VS:  There were no vitals taken for this visit.   General: Well developed, well nourished,female appearing in no acute distress. Head: Normocephalic, atraumatic. Neck: No carotid bruits. JVD not elevated.  Lungs: Respirations regular and unlabored, without wheezes or rales.  Heart: ***Regular rate and rhythm. No S3 or S4.  No murmur, no rubs, or gallops appreciated. Abdomen: Appears non-distended. No obvious abdominal masses. Msk:  Strength and tone appear normal for age. No obvious joint deformities or effusions. Extremities: No clubbing or cyanosis. No edema.  Distal pedal  pulses are 2+ bilaterally. Neuro: Alert and oriented X 3. Moves all extremities spontaneously. No  focal deficits noted. Psych:  Responds to questions appropriately with a normal affect. Skin: No rashes or lesions noted  Wt Readings from Last 3 Encounters:  10/19/20 214 lb (97.1 kg)  10/13/20 221 lb 6.4 oz (100.4 kg)  09/01/20 224 lb 6.4 oz (101.8 kg)        Studies/Labs Reviewed:   EKG:  EKG is*** ordered today.  The ekg ordered today demonstrates ***  Recent Labs: 10/13/2020: BUN 15; Creatinine, Ser 0.85; Hemoglobin 11.7; Platelets 339; Potassium 4.3; Sodium 136   Lipid Panel    Component Value Date/Time   CHOL  05/14/2008 0500    180        ATP III CLASSIFICATION:  <200     mg/dL   Desirable  200-239  mg/dL   Borderline High  >=240    mg/dL   High   TRIG 296 (H) 05/14/2008 0500   HDL 41 05/14/2008 0500   CHOLHDL 4.4 05/14/2008 0500   VLDL 59 (H) 05/14/2008 0500   LDLCALC  05/14/2008 0500    80        Total Cholesterol/HDL:CHD Risk Coronary Heart Disease Risk Table                     Men   Women  1/2 Average Risk   3.4   3.3    Additional studies/ records that were reviewed today include:   Echocardiogram: 07/2017 Study Conclusions   - Left ventricle: The cavity size was normal. Wall thickness was  increased in a pattern of moderate LVH. Systolic function was  normal. The estimated ejection fraction was in the range of 55%  to 60%. Wall motion was normal; there were no regional wall  motion abnormalities. Doppler parameters are consistent with  abnormal left ventricular relaxation (grade 1 diastolic  dysfunction).  - Aortic valve: There was mild regurgitation. Valve area (VTI):  1.92 cm^2. Valve area (Vmax): 1.91 cm^2. Valve area (Vmean): 1.78  cm^2.  - Technically adequate study.   Cardiac Catheterization: 10/2020  Normal coronary arteries, left dominant.  Normal LV function.  LVEDP 16 mmHg.  Unless there is high suspicion for microvascular dysfunction, the patient's symptoms are not related to myocardial ischemia.  The abnormal  nuclear study from several years ago is a false positive study.  RECOMMENDATIONS:   Further management per primary cardiology team.    Assessment:    No diagnosis found.   Plan:   In order of problems listed above:  1. ***    Shared Decision Making/Informed Consent:   {Are you ordering a CV Procedure (e.g. stress test, cath, DCCV, TEE, etc)?   Press F2        :[id number]}    Medication Adjustments/Labs and Tests Ordered: Current medicines are reviewed at length with the patient today.  Concerns regarding medicines are outlined above.  Medication changes, Labs and Tests ordered today are listed in the Patient Instructions below. There are no Patient Instructions on file for this visit.   Signed, Erma Heritage, PA-C  11/08/2020 7:22 PM    [facility] [address] [place], [place] [address] Phone: [phone] Fax: [phone]

## 2020-11-09 ENCOUNTER — Ambulatory Visit: Payer: BC Managed Care – PPO | Admitting: Student

## 2020-12-14 ENCOUNTER — Encounter: Payer: Self-pay | Admitting: Gastroenterology

## 2020-12-14 ENCOUNTER — Ambulatory Visit (INDEPENDENT_AMBULATORY_CARE_PROVIDER_SITE_OTHER): Payer: BC Managed Care – PPO | Admitting: Gastroenterology

## 2020-12-14 ENCOUNTER — Other Ambulatory Visit: Payer: Self-pay

## 2020-12-14 VITALS — BP 131/84 | HR 102 | Temp 97.1°F | Ht 65.0 in | Wt 213.6 lb

## 2020-12-14 DIAGNOSIS — K625 Hemorrhage of anus and rectum: Secondary | ICD-10-CM

## 2020-12-14 DIAGNOSIS — D509 Iron deficiency anemia, unspecified: Secondary | ICD-10-CM

## 2020-12-14 DIAGNOSIS — K219 Gastro-esophageal reflux disease without esophagitis: Secondary | ICD-10-CM

## 2020-12-14 DIAGNOSIS — K5900 Constipation, unspecified: Secondary | ICD-10-CM | POA: Diagnosis not present

## 2020-12-14 MED ORDER — LINACLOTIDE 290 MCG PO CAPS: 290.0000 ug | ORAL_CAPSULE | Freq: Every day | ORAL | 0 refills | Status: DC

## 2020-12-14 NOTE — Patient Instructions (Signed)
1. Linzess 270mcg once daily on empty stomach for constipation. RX sent for one month supply. Samples provided. You can take with Relistor or Movantik.  2. Do not take [place]. You should be taking one or the other. They are both in the same class of drugs.  3. Continue omeprazole 40mg  once daily. If you continue to have reflux symptoms, we can increase to twice daily.  4. I will obtain copy of prior records from [facility], last colonoscopy, and we will be in touch with further recommendations. You will likely need a colonoscopy and possible upper endoscopy to investigate iron deficiency anemia.

## 2020-12-14 NOTE — Progress Notes (Signed)
Primary Care Physician:  Asencion Noble, MD  Primary Gastroenterologist:  Elon Alas. Abbey Chatters, DO   Chief Complaint  Patient presents with  . Anemia    Reports had blood with BM few weeks ago and had coughed up blood few times last week, no energy  . Abdominal Pain    Reports stomach hasn't been right since her last back surgery last year. Had to go through abdomen per pt and her organs got shifted over.     HPI:  Selena [name] is a 70 y.o. female with history of hypertension, hyperlipidemia, TIAs, GERD, COPD, chronic back pain, chronic abdominal pain extensively evaluated at [facility] health previously, recent cardiac catheterization for chest tightness which was unremarkable presenting for further evaluation of anemia and abdominal pain. Patient last seen by [place] in 2016.  Labs from November 04, 2020: Hemoglobin normal at 11.8 down from 14.6 in January 2021.  Hematocrit 38.3, MCV 75, platelets 385,000, ferritin low normal at 15, B12 559, folate 4.1, TIBC elevated at 562, serum iron low at 22, iron saturations low at 4%.   Today:  No energy. Back surgery one year ago. Did not need transfusion. No heartburn. Regurgitation without warning. When eats, feels like a big air bubble in the throat. Insurance stopped paying for pantoprazole one month ago and now having more problems. Since on omeprazole having problems. Heart issues, pain in chest into arm/sob. Recent cardiac cath ok. Constipation, abdominal spasm. Pain in right lower abdomen. Lasted five hours this weekend. BM bristol 1, relistor or movantik every day. Always had problems with constipation even before pain medications. Gets sick on stomach due to constipation. May not have a BM for over a week. Denies melena, vaginal bleeding or nosebleeds. Has noted brbpr with straining. Coughed up some bright red blood last week, from the back of throat when she cleared her throat.  Incomplete colonoscopy February 2009: Extremely tortuous sigmoid  colon which prevented successful intubation of the cecum.  Colonoscope was advanced to approximately 70 to 80 cm beyond the anal verge, cecum was not visualized.  Examined portion unremarkable except for melanosis coli.  Moderate internal hemorrhoids.  Sigmoidoscopy March 2009: Performed due to filling defect in sigmoid colon noted on barium enema. Advance the colonoscope to 60 to 70 cm beyond the anal verge.  Appeared to be at the splenic flexure.  Rare sigmoid colon diverticula.  Melanosis coli.  Moderate internal hemorrhoids. Recommended for colonoscopy in 3 years at [facility] with fluoroscopy and overtube.   EGD May 2012: Possible distal esophageal ring, esophagus dilated.  Multiple antral erosions, biopsy showed mild gastritis.  No H. Pylori.  Current Outpatient Medications  Medication Sig Dispense Refill  . albuterol (PROVENTIL HFA;VENTOLIN HFA) 108 (90 Base) MCG/ACT inhaler Inhale 2 puffs into the lungs every 4 (four) hours as needed for wheezing or shortness of breath. 1 Inhaler 0  . aspirin EC 81 MG tablet Take 1 tablet (81 mg total) by mouth daily. 90 tablet 3  . chlorthalidone (HYGROTON) 25 MG tablet Take 25 mg by mouth daily.    . clobetasol (TEMOVATE) 0.05 % external solution Apply 1 application topically 2 (two) times daily.  3  . cyclobenzaprine (FLEXERIL) 10 MG tablet Take 1 tablet (10 mg total) by mouth 3 (three) times daily as needed for muscle spasms. (Patient taking differently: Take 10 mg by mouth 3 (three) times daily.) 30 tablet 0  . desonide (DESOWEN) 0.05 % cream Apply 1 application topically 2 (two) times daily as  needed (for psoriasis on skin).   3  . diclofenac sodium (VOLTAREN) 1 % GEL Apply 2 g topically 3 (three) times daily as needed (for knee pain/arthritis).    Marland Kitchen diphenhydrAMINE (BENADRYL) 25 MG tablet Take 25 mg by mouth every 6 (six) hours as needed for allergies.    Marland Kitchen ELDERBERRY PO Take 2 tablets by mouth daily. In the fall/winter    . KLOR-CON M10 10 MEQ tablet  Take 20 mEq by mouth daily.    Marland Kitchen levothyroxine (SYNTHROID) 137 MCG tablet Take 137 mcg by mouth daily before breakfast.    . losartan (COZAAR) 100 MG tablet Take 100 mg by mouth daily.    . magnesium hydroxide (MILK OF MAGNESIA) 400 MG/5ML suspension Take 15 mLs by mouth daily as needed for mild constipation, indigestion or heartburn.    . metoprolol tartrate (LOPRESSOR) 50 MG tablet Take 1 tablet (50 mg total) by mouth 2 (two) times daily. 180 tablet 3  . morphine (MS CONTIN) 30 MG 12 hr tablet Take 30 mg by mouth every 12 (twelve) hours.    Marland Kitchen morphine (MSIR) 15 MG tablet Take 30 mg by mouth every 8 (eight) hours as needed for severe pain.    Marland Kitchen MOVANTIK 25 MG TABS tablet Take 25 mg by mouth daily. As needed    . nitroGLYCERIN (NITROSTAT) 0.4 MG SL tablet Place 0.4 mg under the tongue every 5 (five) minutes as needed for chest pain.    Marland Kitchen omeprazole (PRILOSEC) 40 MG capsule Take 40 mg by mouth daily.    . polyethylene glycol (MIRALAX / GLYCOLAX) 17 g packet Take 17 g by mouth daily as needed.    Marland Kitchen RELISTOR 150 MG TABS Take 450 mg by mouth every morning.    . simvastatin (ZOCOR) 20 MG tablet Take 20 mg by mouth at bedtime.     Marland Kitchen zolpidem (AMBIEN) 5 MG tablet Take 5 mg by mouth at bedtime as needed for sleep.      No current facility-administered medications for this visit.    Allergies as of 12/14/2020 - Review Complete 12/14/2020  Allergen Reaction Noted  . Iohexol Shortness Of Breath and Swelling 08/15/2007  . Nubain [nalbuphine hcl] Shortness Of Breath and Other (See Comments) 12/13/2010  . Penicillins Itching, Rash, and Other (See Comments)   . Iodinated diagnostic agents Swelling 01/04/2014  . Latex Rash 06/12/2012    Past Medical History:  Diagnosis Date  . Abnormal findings on imaging of biliary tract 1997-2000   CBD 36mm, Dr Albin Felling biliary/pancreatic manometry, EUS Dr. Ardis Hughs 09/18/07 dilated but otherwise normal extrahepatic duct, no masses  . Arthritis   . Asthma   . Bursitis    . Cervical ca ([place]) 1984  . Chronic abdominal pain    Functional, seen @[facility] , extensive WU here benign   . Chronic back pain    On methadone  . Constipation   . COPD (chronic obstructive pulmonary disease) ([place])   . Depression    w/ menopause  . Essential hypertension   . GERD (gastroesophageal reflux disease)   . H/O hiatal hernia   . Headache    Hx of migraines in past  . Hemorrhoids   . Hyperlipemia   . Hypothyroidism   . MI (myocardial infarction) ([place]) 2000   Details not clear  . Palpitations    PVCs  . Pneumonia   . 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 PUD (peptic ulcer disease) 1997/1998  . S/P colonoscopy 09/10/07   Dr.  Fields-incomplete sec to tortuous colon, followed by [facility] & flex sig normal  . Skin cancer    Nose  . TIA (transient ischemic attack) 1983    Past Surgical History:  Procedure Laterality Date  . ABDOMINAL EXPOSURE N/A 10/05/2019   Procedure: ABDOMINAL EXPOSURE;  Surgeon: [name], MD;  Location: [facility];  Service: Vascular;  Laterality: N/A;  . ABDOMINAL HYSTERECTOMY    . ANTERIOR LUMBAR FUSION N/A 10/05/2019   Procedure: Anterior Lumbar Interbody Fusion - Lumbar Five-Sacral One;  Surgeon: Kary Kos, MD;  Location: [place];  Service: Neurosurgery;  Laterality: N/A;  Anterior Lumbar Interbody Fusion - Lumbar Five-Sacral One  . APPENDECTOMY    . arthroscopic knee    . BACK SURGERY    . CARDIAC CATHETERIZATION  2005  . CATARACT EXTRACTION W/PHACO Right 09/02/2014   Procedure: CATARACT EXTRACTION PHACO AND INTRAOCULAR LENS PLACEMENT RIGHT EYE;  Surgeon: Tonny Branch, MD;  Location: [facility];  Service: Ophthalmology;  Laterality: Right;  CDE:8.96  . CATARACT EXTRACTION W/PHACO Left 09/13/2014   Procedure: CATARACT EXTRACTION PHACO AND INTRAOCULAR LENS PLACEMENT LEFT EYE;  Surgeon: Tonny Branch, MD;  Location: [facility];  Service: Ophthalmology;  Laterality: Left;  CDE 6.20  . CHOLECYSTECTOMY    . EYE SURGERY Bilateral    cataract  removal  . INGUINAL HERNIA REPAIR     left  . JOINT REPLACEMENT Bilateral    knees  . LEFT HEART CATH AND CORONARY ANGIOGRAPHY N/A 10/19/2020   Procedure: LEFT HEART CATH AND CORONARY ANGIOGRAPHY;  Surgeon: Belva Crome, MD;  Location: [facility];  Service: Cardiovascular;  Laterality: N/A;  . PARTIAL HYSTERECTOMY    . right arm     nerve  . TONSILLECTOMY    . TONSILLECTOMY    . TOTAL KNEE ARTHROPLASTY  06/20/2012   Procedure: TOTAL KNEE ARTHROPLASTY;  Surgeon: Yvette Rack., MD;  Location: [place];  Service: Orthopedics;  Laterality: Right;  . TOTAL KNEE ARTHROPLASTY Left 04/02/2014   Procedure: TOTAL KNEE ARTHROPLASTY;  Surgeon: Yvette Rack., MD;  Location: [place];  Service: Orthopedics;  Laterality: Left;    Family History  Problem Relation Age of Onset  . Ulcers Brother   . Heart attack Brother   . Ulcers Sister   . Thyroid disease Daughter   . Hypertension Daughter   . Other Daughter        nervous  . Hypertension Sister   . Heart disease Sister   . Thyroid disease Sister   . Hypertension Sister   . Heart disease Sister   . Thyroid disease Sister   . Heart attack Brother   . Cancer Brother        prostate    Social History   Socioeconomic History  . Marital status: Married    Spouse name: Not on file  . Number of children: 1  . Years of education: Not on file  . Highest education level: Not on file  Occupational History  . Occupation: Training and development officer: [employer]  Tobacco Use  . Smoking status: Former Smoker    Packs/day: 1.00    Years: 22.00    Pack years: 22.00    Types: Cigarettes    Start date: 08/06/1981    Quit date: 01/05/2004    Years since quitting: 16.9  . Smokeless tobacco: Never Used  . Tobacco comment: Quit in 2005  Vaping Use  . Vaping Use: Never used  Substance and Sexual  Activity  . Alcohol use: No    Alcohol/week: 0.0 standard drinks  . Drug use: No  . Sexual activity: Not on file    Comment: hyst  Other  Topics Concern  . Not on file  Social History Narrative  . Not on file   Social Determinants of Health   Financial Resource Strain: Not on file  Food Insecurity: Not on file  Transportation Needs: Not on file  Physical Activity: Not on file  Stress: Not on file  Social Connections: Not on file  Intimate Partner Violence: Not on file      ROS:  General: Negative for anorexia, weight loss, fever, chills, fatigue, weakness. Eyes: Negative for vision changes.  ENT: Negative for hoarseness, difficulty swallowing , nasal congestion. CV: Negative for chest pain, angina, palpitations, dyspnea on exertion, peripheral edema.  Respiratory: Negative for dyspnea at rest, dyspnea on exertion, cough, sputum, wheezing.  GI: See history of present illness. GU:  Negative for dysuria, hematuria, urinary incontinence, urinary frequency, nocturnal urination.  MS: Negative for joint pain, low back pain.  Derm: Negative for rash or itching.  Neuro: Negative for weakness, abnormal sensation, seizure, frequent headaches, memory loss, confusion.  Psych: Negative for anxiety, depression, suicidal ideation, hallucinations.  Endo: Negative for unusual weight change.  Heme: Negative for bruising or bleeding. Allergy: Negative for rash or hives.    Physical Examination:  BP 131/84   Pulse (!) 102   Temp (!) 97.1 F (36.2 C)   Ht 5\' 5"  (1.651 m)   Wt 213 lb 9.6 oz (96.9 kg)   BMI 35.54 kg/m    General: Well-nourished, well-developed in no acute distress.  Head: Normocephalic, atraumatic.   Eyes: Conjunctiva pink, no icterus. Mouth: masked Neck: Supple without thyromegaly, masses, or lymphadenopathy.  Lungs: Clear to auscultation bilaterally.  Heart: Regular rate and rhythm, no murmurs rubs or gallops.  Abdomen: Bowel sounds are normal, nontender, nondistended, no hepatosplenomegaly or masses, no abdominal bruits or    hernia , no rebound or guarding.   Rectal: not performed Extremities: No  lower extremity edema. No clubbing or deformities.  Neuro: Alert and oriented x 4 , grossly normal neurologically.  Skin: Warm and dry, no rash or jaundice.   Psych: Alert and cooperative, normal mood and affect.  Labs: Lab Results  Component Value Date   WBC 8.6 10/13/2020   HGB 11.7 (L) 10/13/2020   HCT 39.0 10/13/2020   MCV 77.1 (L) 10/13/2020   PLT 339 10/13/2020   Lab Results  Component Value Date   CREATININE 0.85 10/13/2020   BUN 15 10/13/2020   NA 136 10/13/2020   K 4.3 10/13/2020   CL 101 10/13/2020   CO2 25 10/13/2020       Imaging Studies: No results found.   Assessment: 70 y/o female presenting for further evaluation of IDA and chronic  abdominal pain.   IDA: recent drop in H/H with microcytic indices and iron deficiency. Has noted small volume brbpr in setting of constipation and "coughed" up some blood last week. No melena. She reports she was recently heme negative. She is unsure when her last colonoscopy was, she believes she did have one at [facility] around 2012. She was a difficult colonoscopy, with incomplete exam locally in 2009. Will likely need colonoscopy with possible EGD but will request records first.  Abdominal pain: she relates right sided lower abdominal pain since her back surgery one year ago. Also worse with constipation. Suspect multifactorial.  GERD: poor controlled since  insurance quit paying for pantoprazole and she had to switch to omeprazole. Just recently changed. Will continue current regimen, reinforced antireflux measures. If persistent symptoms, would consider short term BID and would not rule out possibility of EGD. May require EGD to evaluate for IDA.  Plan: 1. Linzess 239mcg once daily for constipation. 2. Can continue Relistor OR Movantik but not both. 3. Continue omeprazole 40mg  daily for now. If persistent symptoms, consider increasing to BID. 4. Will obtain copy of records from [facility]. She will need colonoscopy for IDA with  possible EGD but will need to determine if can be done locally. Await records.

## 2021-02-17 ENCOUNTER — Telehealth: Payer: Self-pay | Admitting: Gastroenterology

## 2021-02-17 NOTE — Telephone Encounter (Signed)
Please let pt know I have not been able to track down colonoscopy at [facility] from 2012.   We will attempt once again.   Dena: In interim, please have patient go for updated CBC, ferritin, folate Dx: anemia  Manuela Schwartz: can we request colonoscopy and path from [facility] done 2012.

## 2021-02-21 ENCOUNTER — Other Ambulatory Visit: Payer: Self-pay

## 2021-02-21 DIAGNOSIS — E538 Deficiency of other specified B group vitamins: Secondary | ICD-10-CM

## 2021-02-21 DIAGNOSIS — K279 Peptic ulcer, site unspecified, unspecified as acute or chronic, without hemorrhage or perforation: Secondary | ICD-10-CM

## 2021-02-21 DIAGNOSIS — K219 Gastro-esophageal reflux disease without esophagitis: Secondary | ICD-10-CM

## 2021-02-21 DIAGNOSIS — D649 Anemia, unspecified: Secondary | ICD-10-CM

## 2021-02-21 DIAGNOSIS — D509 Iron deficiency anemia, unspecified: Secondary | ICD-10-CM

## 2021-02-21 NOTE — Telephone Encounter (Signed)
Labs done and in the mail.

## 2021-02-21 NOTE — Telephone Encounter (Signed)
Dx for folate: folate deficiency

## 2021-02-21 NOTE — Telephone Encounter (Signed)
I phoned and spoke with the pt regarding the colonoscopy trackdown and the blood work needing to be done.  The pt advised me she is very sick right now (pt has covid). I advised her I will put the labs in the mail to her. Well it is needing a code for folate. I have several codes I have tried and it will not cover and while the pt can sign a waiver it has to be in person. The cost of the folate is $50.00. the pt will call us and go to the lab once she is symptom free. In the mean time I can put those labs in the mail but anemia will not cover the folate

## 2021-02-21 NOTE — Progress Notes (Signed)
ERROR  Need to speak with Magda Paganini regarding this.

## 2021-02-23 NOTE — Telephone Encounter (Signed)
Faxed a request to [facility] for 2012 colonoscopy and path report

## 2021-03-08 ENCOUNTER — Other Ambulatory Visit: Payer: Self-pay | Admitting: Gastroenterology

## 2021-03-22 NOTE — Telephone Encounter (Signed)
Patient last seen in May.  Still waiting on updated labs from July.  She has not had these completed to my knowledge.  We were unable to retrieve any colonoscopy reports from 2012 which is when patient states that she had her last procedure.  At this point would offer patient follow-up of anemia as it has been 3 months since we last saw her.  She can see Dr. Abbey Chatters in order to get in sooner.  Encouraged her to have blood work done.

## 2021-03-22 NOTE — Telephone Encounter (Signed)
Phoned and LMOVM for the pt to return call regarding bloodwork

## 2021-03-24 NOTE — Telephone Encounter (Signed)
Reminder letter along with copy of labs mailed to the pt. Today.

## 2021-03-24 NOTE — Telephone Encounter (Signed)
Phoned and LMOVM for the pt to return call. Will send out a letter today regarding this.

## 2021-04-04 LAB — CBC WITH DIFFERENTIAL/PLATELET
Absolute Monocytes: 1021 cells/uL — ABNORMAL HIGH (ref 200–950)
Basophils Absolute: 13 cells/uL (ref 0–200)
Basophils Relative: 0.1 %
Eosinophils Absolute: 0 cells/uL — ABNORMAL LOW (ref 15–500)
Eosinophils Relative: 0 %
HCT: 37.7 % (ref 35.0–45.0)
Hemoglobin: 11.8 g/dL (ref 11.7–15.5)
Lymphs Abs: 1676 cells/uL (ref 850–3900)
MCH: 23.4 pg — ABNORMAL LOW (ref 27.0–33.0)
MCHC: 31.3 g/dL — ABNORMAL LOW (ref 32.0–36.0)
MCV: 74.7 fL — ABNORMAL LOW (ref 80.0–100.0)
MPV: 10 fL (ref 7.5–12.5)
Monocytes Relative: 8.1 %
Neutro Abs: 9891 cells/uL — ABNORMAL HIGH (ref 1500–7800)
Neutrophils Relative %: 78.5 %
Platelets: 415 10*3/uL — ABNORMAL HIGH (ref 140–400)
RBC: 5.05 10*6/uL (ref 3.80–5.10)
RDW: 17.1 % — ABNORMAL HIGH (ref 11.0–15.0)
Total Lymphocyte: 13.3 %
WBC: 12.6 10*3/uL — ABNORMAL HIGH (ref 3.8–10.8)

## 2021-04-04 LAB — FOLATE: Folate: 17.2 ng/mL

## 2021-04-04 LAB — FERRITIN: Ferritin: 8 ng/mL — ABNORMAL LOW (ref 16–288)

## 2021-04-11 ENCOUNTER — Other Ambulatory Visit: Payer: Self-pay | Admitting: Gastroenterology

## 2021-04-11 MED ORDER — LINACLOTIDE 290 MCG PO CAPS: ORAL_CAPSULE | ORAL | 3 refills | Status: DC

## 2021-04-12 ENCOUNTER — Encounter: Payer: Self-pay | Admitting: Internal Medicine

## 2021-05-29 IMAGING — MG MM DIGITAL DIAGNOSTIC UNILAT*L* W/ TOMO W/ CAD
4 series · 4 of 12 positions shown · non-contrast
Comparison: Previous exams including recent screening mammogram
dated 09/21/2020.

CLINICAL DATA: Patient returns today to evaluate a possible LEFT
breast asymmetry questioned on recent screening mammogram.

EXAM:
DIGITAL DIAGNOSTIC UNILATERAL LEFT MAMMOGRAM WITH TOMOSYNTHESIS AND
CAD
TECHNIQUE: Left digital diagnostic mammography and breast tomosynthesis was
performed. The images were evaluated with computer-aided detection.

[L MLO synth-2D]
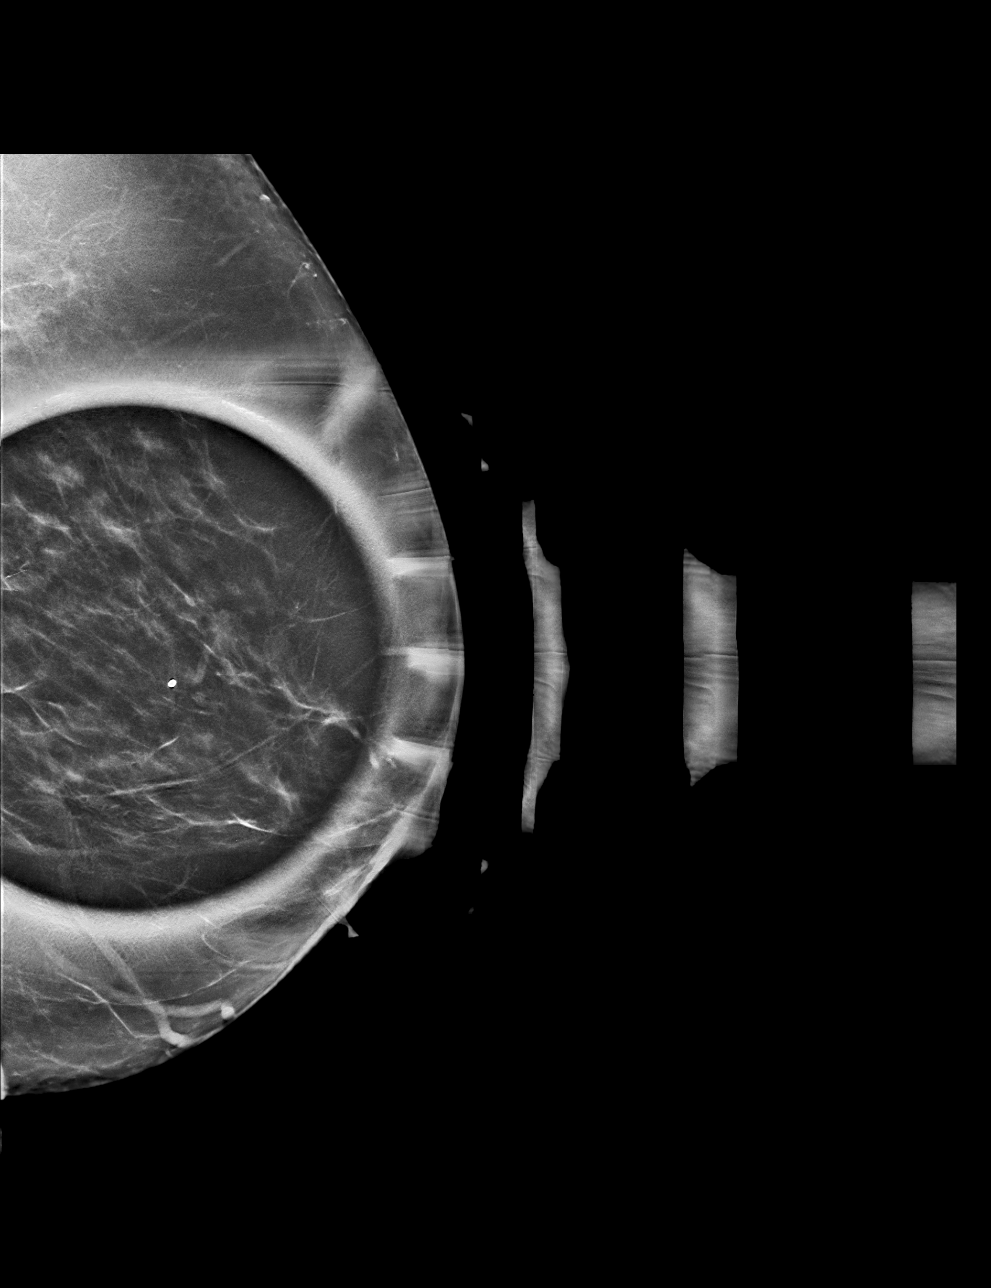

[L ML synth-2D]
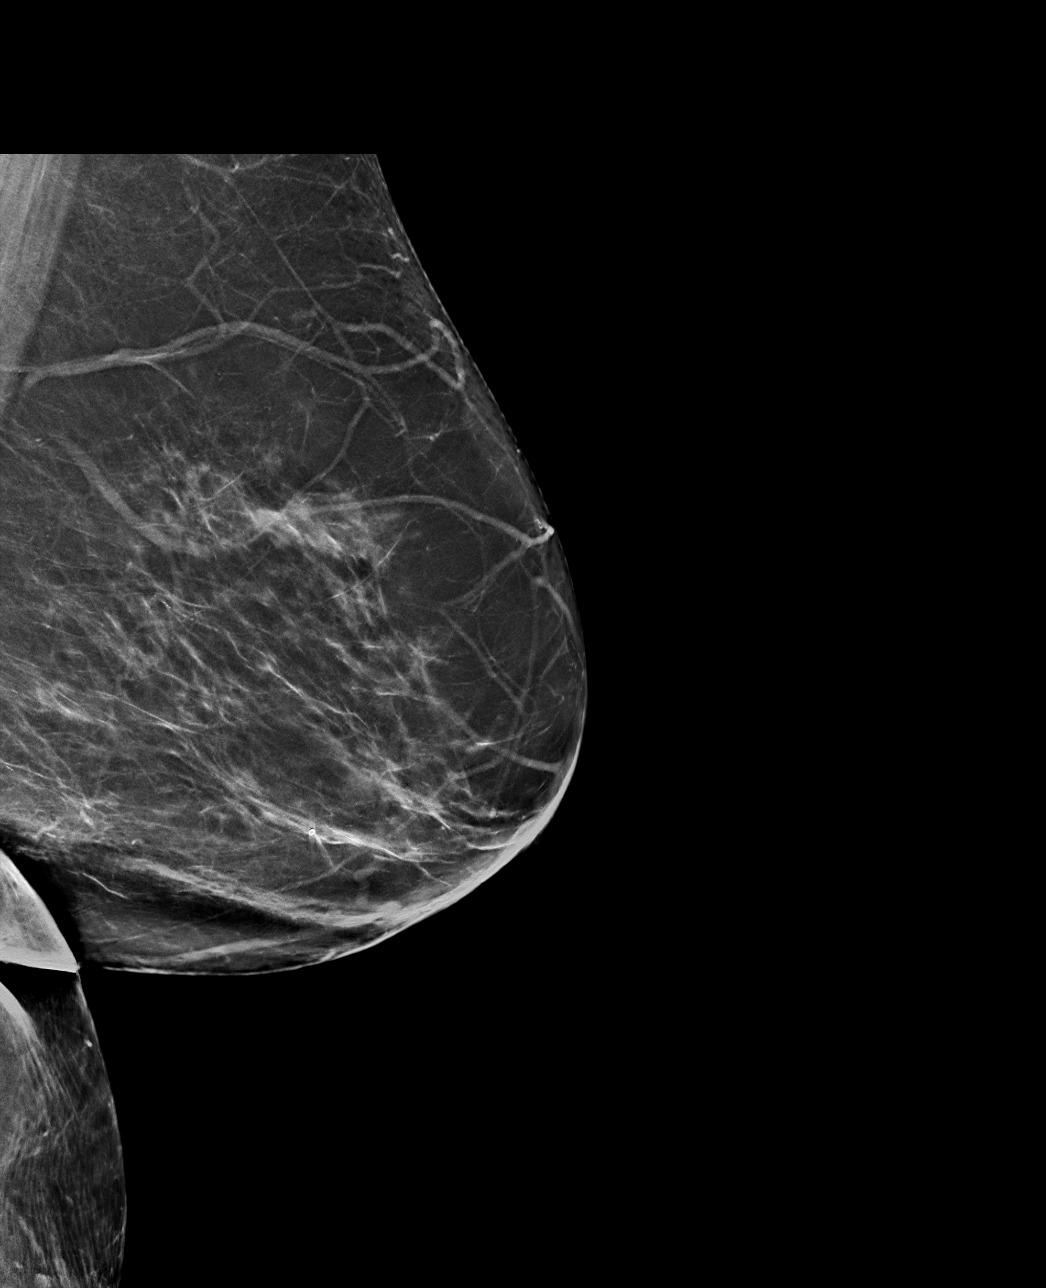

[L MLO tomo · tomo slice 29/58.0]
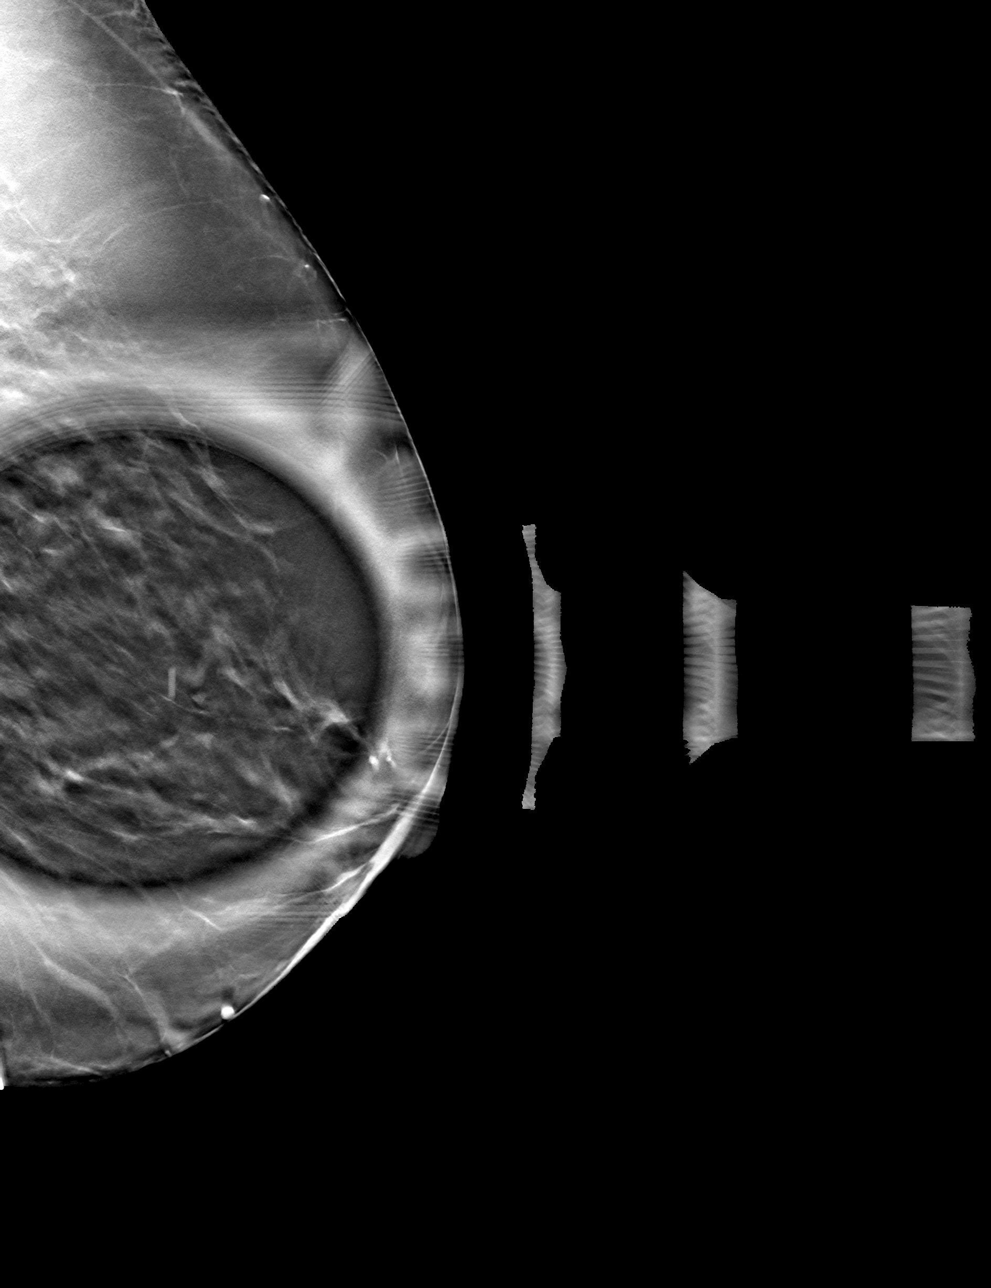

[L ML tomo · tomo slice 35/70.0]
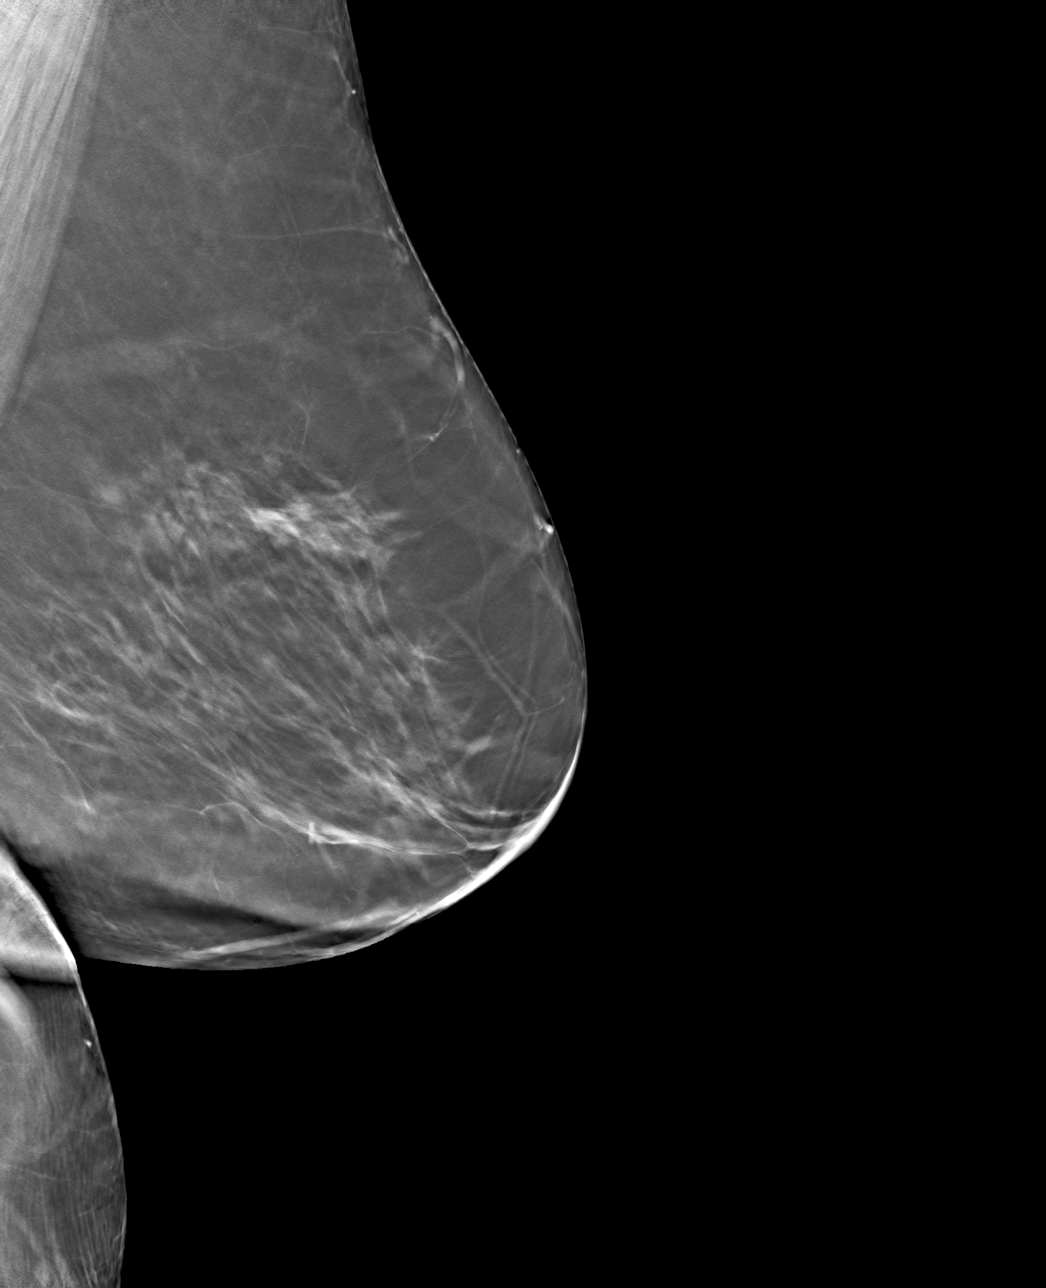

[4 of 12 positions shown; findings below may reference images not displayed]

ACR Breast Density Category c: The breast tissue is heterogeneously
dense, which may obscure small masses.
FINDINGS: On today's additional diagnostic views, including spot compression
with 3D tomosynthesis, there is no persistent asymmetry within the
upper central LEFT breast indicating superimposition of normal
fibroglandular tissues.
IMPRESSION: No evidence of malignancy.

Patient may return to routine annual bilateral screening mammogram
schedule.

RECOMMENDATION:
Screening mammogram in one year.(Code:[id number])

I have discussed the findings and recommendations with the patient.
If applicable, a reminder letter will be sent to the patient
regarding the next appointment.

BI-RADS CATEGORY  1: Negative.

## 2021-06-01 ENCOUNTER — Ambulatory Visit: Payer: BC Managed Care – PPO | Admitting: Internal Medicine

## 2021-07-27 ENCOUNTER — Ambulatory Visit: Payer: BC Managed Care – PPO | Admitting: Internal Medicine

## 2021-08-29 ENCOUNTER — Other Ambulatory Visit (HOSPITAL_COMMUNITY): Payer: Self-pay | Admitting: Internal Medicine

## 2021-08-29 DIAGNOSIS — Z1231 Encounter for screening mammogram for malignant neoplasm of breast: Secondary | ICD-10-CM

## 2021-09-25 ENCOUNTER — Ambulatory Visit (HOSPITAL_COMMUNITY): Payer: BC Managed Care – PPO

## 2021-09-29 ENCOUNTER — Ambulatory Visit (HOSPITAL_COMMUNITY)
Admission: RE | Admit: 2021-09-29 | Discharge: 2021-09-29 | Disposition: A | Discharge status: Home / Self Care | Payer: BC Managed Care – PPO | Source: Ambulatory Visit | Attending: Internal Medicine | Admitting: Internal Medicine

## 2021-09-29 ENCOUNTER — Other Ambulatory Visit: Payer: Self-pay

## 2021-09-29 DIAGNOSIS — Z1231 Encounter for screening mammogram for malignant neoplasm of breast: Secondary | ICD-10-CM | POA: Insufficient documentation

## 2021-11-22 ENCOUNTER — Other Ambulatory Visit: Payer: Self-pay

## 2021-11-22 ENCOUNTER — Ambulatory Visit: Payer: BC Managed Care – PPO | Admitting: Internal Medicine

## 2021-11-22 VITALS — BP 120/68 | HR 92 | Temp 97.5°F | Ht 65.0 in | Wt 209.6 lb

## 2021-11-22 DIAGNOSIS — K5900 Constipation, unspecified: Secondary | ICD-10-CM

## 2021-11-22 DIAGNOSIS — K219 Gastro-esophageal reflux disease without esophagitis: Secondary | ICD-10-CM

## 2021-11-22 DIAGNOSIS — R1319 Other dysphagia: Secondary | ICD-10-CM

## 2021-11-22 DIAGNOSIS — D509 Iron deficiency anemia, unspecified: Secondary | ICD-10-CM | POA: Diagnosis not present

## 2021-11-22 MED ORDER — PEG 3350-KCL-NA BICARB-NACL 420 G PO SOLR: 4000.0000 mL | ORAL | 0 refills | Status: DC

## 2021-11-22 NOTE — Patient Instructions (Signed)
We will schedule you for EGD to further evaluate your chronic reflux and difficulty swallowing.  I may elect to have perform esophageal dilation to help with your swallowing. ? ?At the same time we will perform colonoscopy as it has been over 10 years since your previous colonoscopy. ? ?Continue on Linzess for your chronic constipation.  Okay to take Movantik on top of this as needed. ? ?I will check years blood counts and iron levels today at [facility] labs. ? ?It was very nice meeting you today. ? ?Dr. Abbey Chatters ? ?At [facility] we value your feedback. You may receive a survey about your visit today. Please share your experience as we strive to create trusting relationships with our patients to provide genuine, compassionate, quality care. ? ?We appreciate your understanding and patience as we review any laboratory studies, imaging, and other diagnostic tests that are ordered as we care for you. Our office policy is 5 business days for review of these results, and any emergent or urgent results are addressed in a timely manner for your best interest. If you do not hear from our office in 1 week, please contact us.  ? ?We also encourage the use of MyChart, which contains your medical information for your review as well. If you are not enrolled in this feature, an access code is on this after visit summary for your convenience. Thank you for allowing [place] to be involved in your care. ? ?It was great to see you today!  I hope you have a great rest of your Spring! ? ? ? ?Selena [name]. Abbey Chatters, D.O. ?Gastroenterology and Hepatology ?[facility] ? ? ?

## 2021-11-22 NOTE — Progress Notes (Signed)
Referring Provider: Asencion Noble, MD Primary Care Physician:  Asencion Noble, MD Primary GI:  Dr. Abbey Chatters  Chief Complaint  Patient presents with   Follow-up    Blood work-- low iron    HPI:   Selena [name] is a 71 y.o. female who presents to clinic today for follow-up visit.  Last seen May 2022.  History of chronic GERD well-controlled on omeprazole 40 mg daily.  Occasional breakthrough symptoms.  Does note recently progressively worsening dysphagia.  Primarily with foods.  Feels as though will get stuck in her substernal region.  Due for colonoscopy.  Last colonoscopy 2012 unremarkable per patient.  We have been unable to track down these records however.  No family history of colorectal malignancy.  No melena hematochezia.  Also history of iron deficiency anemia.  History of irritable bowel syndrome constipation predominant.  Currently maintained on Linzess 290 mcg daily.  Working well.    Past Medical History:  Diagnosis Date   Abnormal findings on imaging of biliary tract 1997-2000   CBD 73m, Dr PAlbin Fellingbiliary/pancreatic manometry, EUS Dr. JArdis Hughs2/12/09 dilated but otherwise normal extrahepatic duct, no masses   Arthritis    Asthma    Bursitis    Cervical ca ([facility] 1984   Chronic abdominal pain    Functional, seen '@[facility]'$ , extensive WU here benign    Chronic back pain    On methadone   Constipation    [place]    Depression    w/ menopause   Essential hypertension    GERD (gastroesophageal reflux disease)    H/O hiatal hernia    Headache    Hx of migraines in past   Hemorrhoids    Hyperlipemia    Hypothyroidism    [place]   Details not clear   Palpitations    PVCs   Pneumonia    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 FDonald Poresec to tortuous colon, followed by BE & flex sig normal   Skin  cancer    Nose   TIA (transient ischemic attack) 1983    Past Surgical History:  Procedure Laterality Date   ABDOMINAL EXPOSURE N/A 10/05/2019   Procedure: ABDOMINAL EXPOSURE;  Surgeon: CMarty Heck MD;  [facility]  Service: Vascular;  Laterality: N/A;   ABDOMINAL HYSTERECTOMY     ANTERIOR LUMBAR FUSION N/A 10/05/2019   Procedure: Anterior Lumbar Interbody Fusion - Lumbar Five-Sacral One;  Surgeon: CKary Kos MD;  [facility]  Service: Neurosurgery;  Laterality: N/A;  Anterior Lumbar Interbody Fusion - Lumbar Five-Sacral One   APPENDECTOMY     arthroscopic knee     BACK SURGERY     CARDIAC CATHETERIZATION  2005   CATARACT EXTRACTION W/PHACO Right 09/02/2014   Procedure: CATARACT EXTRACTION PHACO AND INTRAOCULAR LENS PLACEMENT RIGHT EYE;  Surgeon: KTonny Branch MD;  Location: [facility];  Service: Ophthalmology;  Laterality: Right;  CDE:8.96   CATARACT EXTRACTION W/PHACO Left 09/13/2014   Procedure: CATARACT EXTRACTION PHACO AND INTRAOCULAR LENS PLACEMENT LEFT EYE;  Surgeon: KTonny Branch MD;  Location: [facility];  Service: Ophthalmology;  Laterality: Left;  CDE 6.20   CHOLECYSTECTOMY     EYE SURGERY Bilateral    cataract removal   INGUINAL HERNIA REPAIR     left   JOINT REPLACEMENT Bilateral    knees   LEFT HEART CATH AND  CORONARY ANGIOGRAPHY N/A 10/19/2020   Procedure: LEFT HEART CATH AND CORONARY ANGIOGRAPHY;  Surgeon: Belva Crome, MD;  Location: [place] [facility];  Service: Cardiovascular;  Laterality: N/A;   PARTIAL HYSTERECTOMY     right arm     nerve   TONSILLECTOMY     TONSILLECTOMY     TOTAL KNEE ARTHROPLASTY  06/20/2012   Procedure: TOTAL KNEE ARTHROPLASTY;  Surgeon: Yvette Rack., MD;  Location: [facility];  Service: Orthopedics;  Laterality: Right;   TOTAL KNEE ARTHROPLASTY Left 04/02/2014   Procedure: TOTAL KNEE ARTHROPLASTY;  Surgeon: Yvette Rack., MD;  Location: [facility];  Service: Orthopedics;  Laterality: Left;    Current Outpatient Medications  Medication  Sig Dispense Refill   albuterol (PROVENTIL HFA;VENTOLIN HFA) 108 (90 Base) MCG/ACT inhaler Inhale 2 puffs into the lungs every 4 (four) hours as needed for wheezing or shortness of breath. 1 Inhaler 0   aspirin EC 81 MG tablet Take 1 tablet (81 mg total) by mouth daily. 90 tablet 3   chlorthalidone (HYGROTON) 25 MG tablet Take 25 mg by mouth daily.     clobetasol (TEMOVATE) 0.05 % external solution Apply 1 application topically 2 (two) times daily.  3   cyclobenzaprine (FLEXERIL) 10 MG tablet Take 1 tablet (10 mg total) by mouth 3 (three) times daily as needed for muscle spasms. (Patient taking differently: Take 10 mg by mouth 3 (three) times daily.) 30 tablet 0   desonide (DESOWEN) 0.05 % cream Apply 1 application topically 2 (two) times daily as needed (for psoriasis on skin).   3   diclofenac sodium (VOLTAREN) 1 % GEL Apply 2 g topically 3 (three) times daily as needed (for knee pain/arthritis).     diphenhydrAMINE (BENADRYL) 25 MG tablet Take 25 mg by mouth every 6 (six) hours as needed for allergies.     ELDERBERRY PO Take 2 tablets by mouth daily. In the fall/winter     levothyroxine (SYNTHROID) 137 MCG tablet Take 137 mcg by mouth daily before breakfast.     linaclotide (LINZESS) 290 MCG CAPS capsule TAKE (1) CAPSULE BY MOUTH DAILY BEFORE BREAKFAST 90 capsule 3   losartan (COZAAR) 100 MG tablet Take 100 mg by mouth daily.     metoprolol tartrate (LOPRESSOR) 50 MG tablet Take 1 tablet (50 mg total) by mouth 2 (two) times daily. 180 tablet 3   morphine (MS CONTIN) 30 MG 12 hr tablet Take 30 mg by mouth every 12 (twelve) hours.     morphine (MSIR) 15 MG tablet Take 30 mg by mouth every 8 (eight) hours as needed for severe pain.     MOVANTIK 25 MG TABS tablet Take 25 mg by mouth daily. As needed     nitroGLYCERIN (NITROSTAT) 0.4 MG SL tablet Place 0.4 mg under the tongue every 5 (five) minutes as needed for chest pain.     omeprazole (PRILOSEC) 40 MG capsule Take 40 mg by mouth daily.      simvastatin (ZOCOR) 20 MG tablet Take 20 mg by mouth at bedtime.      zolpidem (AMBIEN) 5 MG tablet Take 5 mg by mouth at bedtime as needed for sleep.      No current facility-administered medications for this visit.    Allergies as of 11/22/2021 - Review Complete 11/22/2021  Allergen Reaction Noted   Iohexol Shortness Of Breath and Swelling 08/15/2007   Nubain [nalbuphine hcl] Shortness Of Breath and Other (See Comments) 12/13/2010   Penicillins Itching, Rash,  and Other (See Comments)    Iodinated contrast media Swelling 01/04/2014   Latex Rash 06/12/2012    Family History  Problem Relation Age of Onset   Ulcers Brother    Heart attack Brother    Ulcers Sister    Thyroid disease Daughter    Hypertension Daughter    Other Daughter        nervous   Hypertension Sister    Heart disease Sister    Thyroid disease Sister    Hypertension Sister    Heart disease Sister    Thyroid disease Sister    Heart attack Brother    Cancer Brother        prostate    Social History   Socioeconomic History   Marital status: Married    Spouse name: Not on file   Number of children: 1   Years of education: Not on file   Highest education level: Not on file  Occupational History   Occupation: Training and development officer: [employer]  Tobacco Use   Smoking status: Former    Packs/day: 1.00    Years: 22.00    Pack years: 22.00    Types: Cigarettes    Start date: 08/06/1981    Quit date: 01/05/2004    Years since quitting: 17.8   Smokeless tobacco: Never   Tobacco comments:    Quit in 2005  Vaping Use   Vaping Use: Never used  Substance and Sexual Activity   Alcohol use: No    Alcohol/week: 0.0 standard drinks   Drug use: No   Sexual activity: Not on file    Comment: hyst  Other Topics Concern   Not on file  Social History Narrative   Not on file   Social Determinants of Health   Financial Resource Strain: Not on file  Food Insecurity: Not on file  Transportation Needs:  Not on file  Physical Activity: Not on file  Stress: Not on file  Social Connections: Not on file    Subjective: Review of Systems  Constitutional:  Negative for chills and fever.  HENT:  Negative for congestion and hearing loss.   Eyes:  Negative for blurred vision and double vision.  Respiratory:  Negative for cough and shortness of breath.   Cardiovascular:  Negative for chest pain and palpitations.  Gastrointestinal:  Positive for heartburn. Negative for abdominal pain, blood in stool, constipation, diarrhea, melena and vomiting.  Genitourinary:  Negative for dysuria and urgency.  Musculoskeletal:  Negative for joint pain and myalgias.  Skin:  Negative for itching and rash.  Neurological:  Negative for dizziness and headaches.  Psychiatric/Behavioral:  Negative for depression. The patient is not nervous/anxious.     Objective: BP 120/68   Pulse 92   Temp (!) 97.5 F (36.4 C)   Ht '5\' 5"'$  (1.651 m)   Wt 209 lb 9.6 oz (95.1 kg)   BMI 34.88 kg/m  Physical Exam Constitutional:      Appearance: Normal appearance.  HENT:     Head: Normocephalic and atraumatic.  Eyes:     Extraocular Movements: Extraocular movements intact.     Conjunctiva/sclera: Conjunctivae normal.  Cardiovascular:     Rate and Rhythm: Normal rate and regular rhythm.  Pulmonary:     Effort: Pulmonary effort is normal.     Breath sounds: Normal breath sounds.  Abdominal:     General: Bowel sounds are normal.     Palpations: Abdomen is soft.  Musculoskeletal:  General: No swelling. Normal range of motion.     Cervical back: Normal range of motion and neck supple.  Skin:    General: Skin is warm and dry.     Coloration: Skin is not jaundiced.  Neurological:     General: No focal deficit present.     Mental Status: She is alert and oriented to person, place, and time.  Psychiatric:        Mood and Affect: Mood normal.        Behavior: Behavior normal.     Assessment: *GERD *Esophageal  dysphagia *Colon cancer screening *IBS with constipation *Iron deficiency anemia  Plan: Will schedule for EGD with possible dilation to evaluate for peptic ulcer disease, esophagitis, gastritis, H. Pylori, duodenitis, or other. Will also evaluate for esophageal stricture, Schatzki's ring, esophageal web or other.   At the same time we will perform colonoscopy for colon cancer screening purposes.  The risks including infection, bleed, or perforation as well as benefits, limitations, alternatives and imponderables have been reviewed with the patient. Potential for esophageal dilation, biopsy, etc. have also been reviewed.  Questions have been answered. All parties agreeable.  Check CBC and iron studies today.  Continue on Linzess 290 mcg daily for IBS constipation predominant.  Continue omeprazole 40 mg daily.  11/22/2021 12:37 PM   Disclaimer: This note was dictated with voice recognition software. Similar sounding words can inadvertently be transcribed and may not be corrected upon review.

## 2021-12-05 LAB — CBC
HCT: 35.9 % (ref 35.0–45.0)
Hemoglobin: 11.1 g/dL — ABNORMAL LOW (ref 11.7–15.5)
MCH: 22.1 pg — ABNORMAL LOW (ref 27.0–33.0)
MCHC: 30.9 g/dL — ABNORMAL LOW (ref 32.0–36.0)
MCV: 71.5 fL — ABNORMAL LOW (ref 80.0–100.0)
MPV: 10.3 fL (ref 7.5–12.5)
Platelets: 352 10*3/uL (ref 140–400)
RBC: 5.02 10*6/uL (ref 3.80–5.10)
RDW: 16.4 % — ABNORMAL HIGH (ref 11.0–15.0)
WBC: 8.6 10*3/uL (ref 3.8–10.8)

## 2021-12-05 LAB — IRON,TIBC AND FERRITIN PANEL
%SAT: 4 % (calc) — ABNORMAL LOW (ref 16–45)
Ferritin: 5 ng/mL — ABNORMAL LOW (ref 16–288)
Iron: 24 ug/dL — ABNORMAL LOW (ref 45–160)
TIBC: 596 mcg/dL (calc) — ABNORMAL HIGH (ref 250–450)

## 2021-12-13 NOTE — Patient Instructions (Signed)
? ? ? ? ? ? ? ? Selena [name] ? 12/13/2021  ?  ? '@PREFPERIOPPHARMACY'$ @ ? ? Your procedure is scheduled on  12/18/2021. ? ? Report to [facility] at  1015  A.M. ? ? Call this number if you have problems the morning of surgery: ? [phone] ? ? Remember: ? Follow the diet and prep instructions given to you by the office. ? ?  Use your inhaler before you come and bring your rescue inhaler with you. ?  ? Take these medicines the morning of surgery with A SIP OF WATER  ? ?    flexeril(if needed), levothyroxine, metoprolol, MS Contin or MSIR(if needed), prilosec. ?  ? ? Do not wear jewelry, make-up or nail polish. ? Do not wear lotions, powders, or perfumes, or deodorant. ? Do not shave 48 hours prior to surgery.  Men may shave face and neck. ? Do not bring valuables to the hospital. ? [facility] is not responsible for any belongings or valuables. ? ?Contacts, dentures or bridgework may not be worn into surgery.  Leave your suitcase in the car.  After surgery it may be brought to your room. ? ?For patients admitted to the hospital, discharge time will be determined by your treatment team. ? ?Patients discharged the day of surgery will not be allowed to drive home and must have someone with them for 24 hours.  ? ? ?Special instructions:   DO NOT smoke tobacco or vape for 24 hours before your procedure. ? ?Please read over the following fact sheets that you were given. ?Anesthesia Post-op Instructions and Care and Recovery After Surgery ?  ? ? ? Upper Endoscopy, Adult, Care After ?This sheet gives you information about how to care for yourself after your procedure. Your health care provider may also give you more specific instructions. If you have problems or questions, contact your health care provider. ?What can I expect after the procedure? ?After the procedure, it is common to have: ?A sore throat. ?Mild stomach pain or discomfort. ?Bloating. ?Nausea. ?Follow these instructions at home: ? ?Follow instructions from  your health care provider about what to eat or drink after your procedure. ?Return to your normal activities as told by your health care provider. Ask your health care provider what activities are safe for you. ?Take over-the-counter and prescription medicines only as told by your health care provider. ?If you were given a sedative during the procedure, it can affect you for several hours. Do not drive or operate machinery until your health care provider says that it is safe. ?Keep all follow-up visits as told by your health care provider. This is important. ?Contact a health care provider if you have: ?A sore throat that lasts longer than one day. ?Trouble swallowing. ?Get help right away if: ?You vomit blood or your vomit looks like coffee grounds. ?You have: ?A fever. ?Bloody, black, or tarry stools. ?A severe sore throat or you cannot swallow. ?Difficulty breathing. ?Severe pain in your chest or abdomen. ?Summary ?After the procedure, it is common to have a sore throat, mild stomach discomfort, bloating, and nausea. ?If you were given a sedative during the procedure, it can affect you for several hours. Do not drive or operate machinery until your health care provider says that it is safe. ?Follow instructions from your health care provider about what to eat or drink after your procedure. ?Return to your normal activities as told by your health care provider. ?This information is not intended to  replace advice given to you by your health care provider. Make sure you discuss any questions you have with your health care provider. ?Document Revised: 05/29/2019 Document Reviewed: 12/23/2017 ?Elsevier Patient Education ? [place]. ?Esophageal Dilatation ?Esophageal dilatation, also called esophageal dilation, is a procedure to widen or open a blocked or narrowed part of the esophagus. The esophagus is the part of the body that moves food and liquid from the mouth to the stomach. You may need this procedure  if: ?You have a buildup of scar tissue in your esophagus that makes it difficult, painful, or impossible to swallow. This can be caused by gastroesophageal reflux disease (GERD). ?You have cancer of the esophagus. ?There is a problem with how food moves through your esophagus. ?In some cases, you may need this procedure repeated at a later time to dilate the esophagus gradually. ?Tell a health care provider about: ?Any allergies you have. ?All medicines you are taking, including vitamins, herbs, eye drops, creams, and over-the-counter medicines. ?Any problems you or family members have had with anesthetic medicines. ?Any blood disorders you have. ?Any surgeries you have had. ?Any medical conditions you have. ?Any antibiotic medicines you are required to take before dental procedures. ?Whether you are pregnant or may be pregnant. ?What are the risks? ?Generally, this is a safe procedure. However, problems may occur, including: ?Bleeding due to a tear in the lining of the esophagus. ?A hole, or perforation, in the esophagus. ?What happens before the procedure? ?Ask your health care provider about: ?Changing or stopping your regular medicines. This is especially important if you are taking diabetes medicines or blood thinners. ?Taking medicines such as aspirin and ibuprofen. These medicines can thin your blood. Do not take these medicines unless your health care provider tells you to take them. ?Taking over-the-counter medicines, vitamins, herbs, and supplements. ?Follow instructions from your health care provider about eating or drinking restrictions. ?Plan to have a responsible adult take you home from the hospital or clinic. ?Plan to have a responsible adult care for you for the time you are told after you leave the hospital or clinic. This is important. ?What happens during the procedure? ?You may be given a medicine to help you relax (sedative). ?A numbing medicine may be sprayed into the back of your throat, or  you may gargle the medicine. ?Your health care provider may perform the dilatation using various surgical instruments, such as: ?Simple dilators. This instrument is carefully placed in the esophagus to stretch it. ?Guided wire bougies. This involves using an endoscope to insert a wire into the esophagus. A dilator is passed over this wire to enlarge the esophagus. Then the wire is removed. ?Balloon dilators. An endoscope with a small balloon is inserted into the esophagus. The balloon is inflated to stretch the esophagus and open it up. ?The procedure may vary among health care providers and hospitals. ?What can I expect after the procedure? ?Your blood pressure, heart rate, breathing rate, and blood oxygen level will be monitored until you leave the hospital or clinic. ?Your throat may feel slightly sore and numb. This will get better over time. ?You will not be allowed to eat or drink until your throat is no longer numb. ?When you are able to drink, urinate, and sit on the edge of the bed without nausea or dizziness, you may be able to return home. ?Follow these instructions at home: ?Take over-the-counter and prescription medicines only as told by your health care provider. ?If you were given  a sedative during the procedure, it can affect you for several hours. Do not drive or operate machinery until your health care provider says that it is safe. ?Plan to have a responsible adult care for you for the time you are told. This is important. ?Follow instructions from your health care provider about any eating or drinking restrictions. ?Do not use any products that contain nicotine or tobacco, such as cigarettes, e-cigarettes, and chewing tobacco. If you need help quitting, ask your health care provider. ?Keep all follow-up visits. This is important. ?Contact a health care provider if: ?You have a fever. ?You have pain that is not relieved by medicine. ?Get help right away if: ?You have chest pain. ?You have trouble  breathing. ?You have trouble swallowing. ?You vomit blood. ?You have black, tarry, or bloody stools. ?These symptoms may represent a serious problem that is an emergency. Do not wait to see if the symptoms

## 2021-12-14 ENCOUNTER — Encounter (HOSPITAL_COMMUNITY)
Admission: RE | Admit: 2021-12-14 | Discharge: 2021-12-14 | Disposition: A | Discharge status: Home / Self Care | Payer: BC Managed Care – PPO | Source: Ambulatory Visit | Attending: Internal Medicine | Admitting: Internal Medicine

## 2021-12-14 VITALS — BP 143/71 | HR 111 | Temp 97.8°F | Resp 18 | Ht 65.0 in | Wt 207.0 lb

## 2021-12-14 DIAGNOSIS — Z79899 Other long term (current) drug therapy: Secondary | ICD-10-CM

## 2021-12-14 DIAGNOSIS — I10 Essential (primary) hypertension: Secondary | ICD-10-CM | POA: Diagnosis not present

## 2021-12-14 DIAGNOSIS — Z01818 Encounter for other preprocedural examination: Secondary | ICD-10-CM | POA: Insufficient documentation

## 2021-12-14 LAB — BASIC METABOLIC PANEL
Anion gap: 9 (ref 5–15)
BUN: 16 mg/dL (ref 8–23)
CO2: 26 mmol/L (ref 22–32)
Calcium: 9.3 mg/dL (ref 8.9–10.3)
Chloride: 105 mmol/L (ref 98–111)
Creatinine, Ser: 0.58 mg/dL (ref 0.44–1.00)
GFR, Estimated: >60 mL/min (ref >60)
Glucose, Bld: 119 mg/dL — ABNORMAL HIGH (ref 70–99)
Potassium: 3.6 mmol/L (ref 3.5–5.1)
Sodium: 140 mmol/L (ref 135–145)

## 2021-12-18 ENCOUNTER — Ambulatory Visit (HOSPITAL_COMMUNITY): Payer: BC Managed Care – PPO | Admitting: Anesthesiology

## 2021-12-18 ENCOUNTER — Encounter (HOSPITAL_COMMUNITY): Payer: Self-pay

## 2021-12-18 ENCOUNTER — Ambulatory Visit (HOSPITAL_COMMUNITY)
Admission: RE | Admit: 2021-12-18 | Discharge: 2021-12-18 | Disposition: A | Discharge status: Home / Self Care | Payer: BC Managed Care – PPO | Source: Ambulatory Visit | Attending: Internal Medicine | Admitting: Internal Medicine

## 2021-12-18 ENCOUNTER — Encounter (HOSPITAL_COMMUNITY)
Admission: RE | Disposition: A | Discharge status: Home / Self Care | Payer: Self-pay | Source: Ambulatory Visit | Attending: Internal Medicine

## 2021-12-18 ENCOUNTER — Other Ambulatory Visit: Payer: Self-pay

## 2021-12-18 DIAGNOSIS — K219 Gastro-esophageal reflux disease without esophagitis: Secondary | ICD-10-CM | POA: Diagnosis not present

## 2021-12-18 DIAGNOSIS — K5730 Diverticulosis of large intestine without perforation or abscess without bleeding: Secondary | ICD-10-CM | POA: Insufficient documentation

## 2021-12-18 DIAGNOSIS — R1310 Dysphagia, unspecified: Secondary | ICD-10-CM | POA: Diagnosis present

## 2021-12-18 DIAGNOSIS — J449 Chronic obstructive pulmonary disease, unspecified: Secondary | ICD-10-CM | POA: Insufficient documentation

## 2021-12-18 DIAGNOSIS — Z87891 Personal history of nicotine dependence: Secondary | ICD-10-CM | POA: Insufficient documentation

## 2021-12-18 DIAGNOSIS — K2970 Gastritis, unspecified, without bleeding: Secondary | ICD-10-CM

## 2021-12-18 DIAGNOSIS — I2510 Atherosclerotic heart disease of native coronary artery without angina pectoris: Secondary | ICD-10-CM | POA: Diagnosis not present

## 2021-12-18 DIAGNOSIS — I10 Essential (primary) hypertension: Secondary | ICD-10-CM | POA: Insufficient documentation

## 2021-12-18 DIAGNOSIS — K648 Other hemorrhoids: Secondary | ICD-10-CM | POA: Insufficient documentation

## 2021-12-18 DIAGNOSIS — I252 Old myocardial infarction: Secondary | ICD-10-CM | POA: Insufficient documentation

## 2021-12-18 DIAGNOSIS — Z1211 Encounter for screening for malignant neoplasm of colon: Secondary | ICD-10-CM

## 2021-12-18 DIAGNOSIS — E039 Hypothyroidism, unspecified: Secondary | ICD-10-CM | POA: Insufficient documentation

## 2021-12-18 DIAGNOSIS — D509 Iron deficiency anemia, unspecified: Secondary | ICD-10-CM

## 2021-12-18 HISTORY — PX: ESOPHAGOGASTRODUODENOSCOPY (EGD) WITH PROPOFOL: SHX5813

## 2021-12-18 HISTORY — PX: BIOPSY: SHX5522

## 2021-12-18 HISTORY — PX: COLONOSCOPY WITH PROPOFOL: SHX5780

## 2021-12-18 SURGERY — COLONOSCOPY WITH PROPOFOL
Anesthesia: General

## 2021-12-18 MED ORDER — PROPOFOL 10 MG/ML IV BOLUS
INTRAVENOUS | Status: DC | PRN
Start: 2021-12-18 — End: 2021-12-18
  Administered 2021-12-18: 100 mg via INTRAVENOUS

## 2021-12-18 MED ORDER — LACTATED RINGERS IV SOLN: INTRAVENOUS | Status: DC

## 2021-12-18 MED ORDER — PROPOFOL 500 MG/50ML IV EMUL
INTRAVENOUS | Status: DC | PRN
Start: 2021-12-18 — End: 2021-12-18
  Administered 2021-12-18: 125 ug/kg/min via INTRAVENOUS

## 2021-12-18 MED ORDER — FENTANYL CITRATE (PF) 100 MCG/2ML IJ SOLN
INTRAMUSCULAR | Status: DC | PRN
  Administered 2021-12-18: 50 ug via INTRAVENOUS

## 2021-12-18 MED ORDER — FENTANYL CITRATE (PF) 100 MCG/2ML IJ SOLN
INTRAMUSCULAR | Status: AC
Start: 2021-12-18 — End: ?
  Filled 2021-12-18: qty 2

## 2021-12-18 MED ORDER — LIDOCAINE HCL (PF) 2 % IJ SOLN
INTRAMUSCULAR | Status: AC
  Filled 2021-12-18: qty 5

## 2021-12-18 MED ORDER — LIDOCAINE HCL (CARDIAC) PF 100 MG/5ML IV SOSY
PREFILLED_SYRINGE | INTRAVENOUS | Status: DC | PRN
  Administered 2021-12-18: 50 mg via INTRAVENOUS

## 2021-12-18 NOTE — Discharge Instructions (Addendum)
EGD ?Discharge instructions ?Please read the instructions outlined below and refer to this sheet in the next few weeks. These discharge instructions provide you with general information on caring for yourself after you leave the hospital. Your doctor may also give you specific instructions. While your treatment has been planned according to the most current medical practices available, unavoidable complications occasionally occur. If you have any problems or questions after discharge, please call your doctor. ?ACTIVITY ?You may resume your regular activity but move at a slower pace for the next 24 hours.  ?Take frequent rest periods for the next 24 hours.  ?Walking will help expel (get rid of) the air and reduce the bloated feeling in your abdomen.  ?No driving for 24 hours (because of the anesthesia (medicine) used during the test).  ?You may shower.  ?Do not sign any important legal documents or operate any machinery for 24 hours (because of the anesthesia used during the test).  ?NUTRITION ?Drink plenty of fluids.  ?You may resume your normal diet.  ?Begin with a light meal and progress to your normal diet.  ?Avoid alcoholic beverages for 24 hours or as instructed by your caregiver.  ?MEDICATIONS ?You may resume your normal medications unless your caregiver tells you otherwise.  ?WHAT YOU CAN EXPECT TODAY ?You may experience abdominal discomfort such as a feeling of fullness or ?gas? pains.  ?FOLLOW-UP ?Your doctor will discuss the results of your test with you.  ?SEEK IMMEDIATE MEDICAL ATTENTION IF ANY OF THE FOLLOWING OCCUR: ?Excessive nausea (feeling sick to your stomach) and/or vomiting.  ?Severe abdominal pain and distention (swelling).  ?Trouble swallowing.  ?Temperature over 101? F (37.8? C).  ?Rectal bleeding or vomiting of blood.  ? ? ?Colonoscopy ?Discharge Instructions ? ?Read the instructions outlined below and refer to this sheet in the next few weeks. These discharge instructions provide you with  general information on caring for yourself after you leave the hospital. Your doctor may also give you specific instructions. While your treatment has been planned according to the most current medical practices available, unavoidable complications occasionally occur.  ? ?ACTIVITY ?You may resume your regular activity, but move at a slower pace for the next 24 hours.  ?Take frequent rest periods for the next 24 hours.  ?Walking will help get rid of the air and reduce the bloated feeling in your belly (abdomen).  ?No driving for 24 hours (because of the medicine (anesthesia) used during the test).   ?Do not sign any important legal documents or operate any machinery for 24 hours (because of the anesthesia used during the test).  ?NUTRITION ?Drink plenty of fluids.  ?You may resume your normal diet as instructed by your doctor.  ?Begin with a light meal and progress to your normal diet. Heavy or fried foods are harder to digest and may make you feel sick to your stomach (nauseated).  ?Avoid alcoholic beverages for 24 hours or as instructed.  ?MEDICATIONS ?You may resume your normal medications unless your doctor tells you otherwise.  ?WHAT YOU CAN EXPECT TODAY ?Some feelings of bloating in the abdomen.  ?Passage of more gas than usual.  ?Spotting of blood in your stool or on the toilet paper.  ?IF YOU HAD POLYPS REMOVED DURING THE COLONOSCOPY: ?No aspirin products for 7 days or as instructed.  ?No alcohol for 7 days or as instructed.  ?Eat a soft diet for the next 24 hours.  ?FINDING OUT THE RESULTS OF YOUR TEST ?Not all test results are available  during your visit. If your test results are not back during the visit, make an appointment with your caregiver to find out the results. Do not assume everything is normal if you have not heard from your caregiver or the medical facility. It is important for you to follow up on all of your test results.  ?SEEK IMMEDIATE MEDICAL ATTENTION IF: ?You have more than a spotting of  blood in your stool.  ?Your belly is swollen (abdominal distention).  ?You are nauseated or vomiting.  ?You have a temperature over 101.  ?You have abdominal pain or discomfort that is severe or gets worse throughout the day.  ? ?Your EGD revealed mild amount inflammation in your stomach.  I took biopsies of this to rule out infection with a bacteria called H. pylori.  Await pathology results, my office will contact you. Continue on Omeprazole 40 mg daily. Your esophagus did not show any strictures, elected not to stretch today. ? ?Unfortunately, your colon was not adequately prepped today for colonoscopy.  I did not see any evidence of colon cancer or large polyps, but certainly could have missed smaller polyps due to poor visualization.  Would recommend repeat colonoscopy in 6 months with a different colon prep. ? ?Follow up with GI in 4 months ? ? ?I hope you have a great rest of your week! ? ?Elon Alas. Abbey Chatters, D.O. ?Gastroenterology and Hepatology ?[facility] ? ?

## 2021-12-18 NOTE — Addendum Note (Signed)
Addendum  created 12/18/21 1306 by Karna Dupes, CRNA  ? Charge Capture section accepted  ?  ?

## 2021-12-18 NOTE — Transfer of Care (Signed)
Immediate Anesthesia Transfer of Care Note ? ?Patient: Selena [name] ? ?Procedure(s) Performed: COLONOSCOPY WITH PROPOFOL ?ESOPHAGOGASTRODUODENOSCOPY (EGD) WITH PROPOFOL ?BIOPSY ? ?Patient Location: Short Stay ? ?Anesthesia Type:MAC ? ?Level of Consciousness: awake, alert , oriented and patient cooperative ? ?Airway & Oxygen Therapy: Patient Spontanous Breathing and Patient connected to nasal cannula oxygen ? ?Post-op Assessment: Report given to RN, Post -op Vital signs reviewed and stable and Patient moving all extremities ? ?Post vital signs: Reviewed and stable ? ?Last Vitals:  ?Vitals Value Taken Time  ?BP    ?Temp    ?Pulse    ?Resp    ?SpO2    ? ? ?Last Pain:  ?Vitals:  ? 12/18/21 1054  ?TempSrc:   ?PainSc: 7   ?   ? ?  ? ?Complications: No notable events documented. ?

## 2021-12-18 NOTE — Op Note (Signed)
[facility] ?Patient Name: Selena [name] ?Procedure Date: 12/18/2021 11:03 AM ?MRN: [id number] ?Date of Birth: 1951-06-25 ?Attending MD: Elon Alas. Abbey Chatters , DO ?CSN: [id number] ?Age: 71 ?Admit Type: Outpatient ?Procedure:                Colonoscopy ?Indications:              Screening for colorectal malignant neoplasm ?Providers:                Elon Alas. Abbey Chatters, DO, [place] Page, [place]                          Risa Grill, Technician ?Referring MD:              ?Medicines:                See the Anesthesia note for documentation of the  ?                          administered medications ?Complications:            No immediate complications. ?Estimated Blood Loss:     Estimated blood loss: none. ?Procedure:                Pre-Anesthesia Assessment: ?                          - The anesthesia plan was to use monitored  ?                          anesthesia care (MAC). ?                          After obtaining informed consent, the colonoscope  ?                          was passed under direct vision. Throughout the  ?                          procedure, the patient's blood pressure, pulse, and  ?                          oxygen saturations were monitored continuously. The  ?                          PCF-HQ190L ([id number]) scope was introduced through  ?                          the anus with the intention of advancing to the  ?                          cecum. The scope was advanced to the ascending  ?                          colon before the procedure was aborted. Medications  ?                          were given. The colonoscopy  was performed without  ?                          difficulty. The patient tolerated the procedure  ?                          well. The quality of the bowel preparation was  ?                          evaluated using the BBPS [facility] Bowel Preparation  ?                          Scale) with scores of: Right Colon = 1 (portion of  ?                          mucosa seen, but  other areas not well seen due to  ?                          staining, residual stool and/or opaque liquid),  ?                          Transverse Colon = 1 (portion of mucosa seen, but  ?                          other areas not well seen due to staining, residual  ?                          stool and/or opaque liquid) and Left Colon = 1  ?                          (portion of mucosa seen, but other areas not well  ?                          seen due to staining, residual stool and/or opaque  ?                          liquid). The total BBPS score equals 3. The quality  ?                          of the bowel preparation was inadequate. ?Scope In: 11:05:19 AM ?Scope Out: 11:11:46 AM ?Total Procedure Duration: 0 hours 6 minutes 27 seconds  ?Findings: ?     The perianal and digital rectal examinations were normal. ?     Non-bleeding internal hemorrhoids were found during endoscopy. ?     Multiple medium-mouthed diverticula were found in the sigmoid colon. ?     Extensive amounts of semi-solid stool was found in the entire colon,  ?     precluding visualization. Lavage of the area was performed using copious  ?     amounts of sterile water, resulting in incomplete clearance with  ?     continued poor visualization. ?Impression:               - Preparation of the colon was inadequate. ?                          -  Non-bleeding internal hemorrhoids. ?                          - Diverticulosis in the sigmoid colon. ?                          - Stool in the entire examined colon. ?                          - No specimens collected. ?Moderate Sedation: ?     Per Anesthesia Care ?Recommendation:           - Patient has a contact number available for  ?                          emergencies. The signs and symptoms of potential  ?                          delayed complications were discussed with the  ?                          patient. Return to normal activities tomorrow.  ?                          Written discharge  instructions were provided to the  ?                          patient. ?                          - Resume previous diet. ?                          - Continue present medications. ?                          - Repeat colonoscopy in 6 months because the bowel  ?                          preparation was poor. ?                          - Return to GI clinic in 4 months. ?Procedure Code(s):        --- Professional --- ?                          G0121, 52, Colorectal cancer screening; colonoscopy  ?                          on individual not meeting criteria for high risk ?Diagnosis Code(s):        --- Professional --- ?                          Z12.11, Encounter for screening for malignant  ?                          neoplasm  of colon ?                          K64.8, Other hemorrhoids ?                          K57.30, Diverticulosis of large intestine without  ?                          perforation or abscess without bleeding ?CPT copyright 2019 American Medical Association. All rights reserved. ?The codes documented in this report are preliminary and upon coder review may  ?be revised to meet current compliance requirements. ?Elon Alas. Abbey Chatters, DO ?Elon Alas. Abbey Chatters, DO ?12/18/2021 11:14:31 AM ?This report has been signed electronically. ?Number of Addenda: 0 ?

## 2021-12-18 NOTE — Op Note (Addendum)
[facility] ?Patient Name: Selena [name] ?Procedure Date: 12/18/2021 10:35 AM ?MRN: [id number] ?Date of Birth: 09/19/1950 ?Attending MD: Elon Alas. Abbey Chatters , DO ?CSN: [id number] ?Age: 71 ?Admit Type: Outpatient ?Procedure:                Upper GI endoscopy ?Indications:              Dysphagia, Heartburn ?Providers:                Elon Alas. Abbey Chatters, DO, [place] Page, [place]                          Risa Grill, Technician ?Referring MD:              ?Medicines:                See the Anesthesia note for documentation of the  ?                          administered medications ?Complications:            No immediate complications. ?Estimated Blood Loss:     Estimated blood loss was minimal. ?Procedure:                Pre-Anesthesia Assessment: ?                          - The anesthesia plan was to use monitored  ?                          anesthesia care (MAC). ?                          After obtaining informed consent, the endoscope was  ?                          passed under direct vision. Throughout the  ?                          procedure, the patient's blood pressure, pulse, and  ?                          oxygen saturations were monitored continuously. The  ?                          GIF-H190 ([id number]) scope was introduced through the  ?                          mouth, and advanced to the second part of duodenum.  ?                          The upper GI endoscopy was accomplished without  ?                          difficulty. The patient tolerated the procedure  ?                          well. ?Scope In: 10:57:42 AM ?  Scope Out: 11:01:08 AM ?Total Procedure Duration: 0 hours 3 minutes 26 seconds  ?Findings: ?     The Z-line was regular and was found 40 cm from the incisors. ?     There is no endoscopic evidence of bleeding, areas of erosion,  ?     esophagitis, stenosis, stricture, ulcerations or varices in the entire  ?     esophagus. ?     Patchy mild inflammation characterized by erythema was  found in the  ?     gastric body and in the gastric antrum. Biopsies were taken with a cold  ?     forceps for Helicobacter pylori testing. ?     The duodenal bulb, first portion of the duodenum and second portion of  ?     the duodenum were normal. ?Impression:               - Z-line regular, 40 cm from the incisors. ?                          - Gastritis. Biopsied. ?                          - Normal duodenal bulb, first portion of the  ?                          duodenum and second portion of the duodenum. ?Moderate Sedation: ?     Per Anesthesia Care ?Recommendation:           - Patient has a contact number available for  ?                          emergencies. The signs and symptoms of potential  ?                          delayed complications were discussed with the  ?                          patient. Return to normal activities tomorrow.  ?                          Written discharge instructions were provided to the  ?                          patient. ?                          - Resume previous diet. ?                          - Continue present medications. ?                          - Await pathology results. ?                          - Use Prilosec (omeprazole) 40 mg PO daily. ?                          -  Return to GI clinic in 6 months. ?Procedure Code(s):        --- Professional --- ?                          [phone], Esophagogastroduodenoscopy, flexible,  ?                          transoral; with biopsy, single or multiple ?Diagnosis Code(s):        --- Professional --- ?                          K29.70, Gastritis, unspecified, without bleeding ?                          R13.10, Dysphagia, unspecified ?                          R12, Heartburn ?CPT copyright 2019 American Medical Association. All rights reserved. ?The codes documented in this report are preliminary and upon coder review may  ?be revised to meet current compliance requirements. ?Elon Alas. Abbey Chatters, DO ?Elon Alas. [place], DO ?12/18/2021  11:04:42 AM ?This report has been signed electronically. ?Number of Addenda: 0 ?

## 2021-12-18 NOTE — H&P (Signed)
?Primary Care Physician:  Asencion Noble, MD ?Primary Gastroenterologist:  Dr. Abbey Chatters ? ?Pre-Procedure History & Physical: ?HPI:  Selena [name] is a 71 y.o. female is here for an EGD with possible dilation for GERD/dysphagia and a colonoscopy to be performed for colon cancer screening purposes. ? ?Past Medical History:  ?Diagnosis Date  ? Abnormal findings on imaging of biliary tract 1997-2000  ? CBD 103m, Dr PAlbin Fellingbiliary/pancreatic manometry, EUS Dr. JArdis Hughs2/12/09 dilated but otherwise normal extrahepatic duct, no masses  ? Arthritis   ? Asthma   ? Bursitis   ? Cervical ca ([facility]  ? Chronic abdominal pain   ? Functional, seen [facility] , extensive WU here benign   ? Chronic back pain   ? On methadone  ? Constipation   ? [place]   ? Depression   ? w/ menopause  ? Essential hypertension   ? GERD (gastroesophageal reflux disease)   ? H/O hiatal hernia   ? Headache   ? Hx of migraines in past  ? Hemorrhoids   ? Hyperlipemia   ? Hypothyroidism   ? [place]  ? Details not clear  ? Palpitations   ? PVCs  ? Pneumonia   ? 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 PUD (peptic ulcer disease) 1997/1998  ? S/P colonoscopy 09/10/07  ? Dr. FDonald Poresec to tortuous colon, followed by BE & flex sig normal  ? Skin cancer   ? Nose  ? TIA (transient ischemic attack) 1983  ? ? ?Past Surgical History:  ?Procedure Laterality Date  ? ABDOMINAL EXPOSURE N/A 10/05/2019  ? Procedure: ABDOMINAL EXPOSURE;  Surgeon: CMarty Heck MD;  [facility]  Service: Vascular;  Laterality: N/A;  ? ABDOMINAL HYSTERECTOMY    ? ANTERIOR LUMBAR FUSION N/A 10/05/2019  ? Procedure: Anterior Lumbar Interbody Fusion - Lumbar Five-Sacral One;  Surgeon: CKary Kos MD;  [facility]  Service: Neurosurgery;  Laterality: N/A;  Anterior Lumbar Interbody Fusion - Lumbar Five-Sacral One  ? APPENDECTOMY    ? arthroscopic knee    ? BACK SURGERY    ? CARDIAC  CATHETERIZATION  2005  ? CATARACT EXTRACTION W/PHACO Right 09/02/2014  ? Procedure: CATARACT EXTRACTION PHACO AND INTRAOCULAR LENS PLACEMENT RIGHT EYE;  Surgeon: KTonny Branch MD;  Location: [facility];  Service: Ophthalmology;  Laterality: Right;  CDE:8.96  ? CATARACT EXTRACTION W/PHACO Left 09/13/2014  ? Procedure: CATARACT EXTRACTION PHACO AND INTRAOCULAR LENS PLACEMENT LEFT EYE;  Surgeon: KTonny Branch MD;  Location: [facility];  Service: Ophthalmology;  Laterality: Left;  CDE 6.20  ? CHOLECYSTECTOMY    ? EYE SURGERY Bilateral   ? cataract removal  ? INGUINAL HERNIA REPAIR    ? left  ? JOINT REPLACEMENT Bilateral   ? knees  ? LEFT HEART CATH AND CORONARY ANGIOGRAPHY N/A 10/19/2020  ? Procedure: LEFT HEART CATH AND CORONARY ANGIOGRAPHY;  Surgeon: SBelva Crome MD;  [facility];  Service: Cardiovascular;  Laterality: N/A;  ? PARTIAL HYSTERECTOMY    ? right arm    ? nerve  ? TONSILLECTOMY    ? TONSILLECTOMY    ? TOTAL KNEE ARTHROPLASTY  06/20/2012  ? Procedure: TOTAL KNEE ARTHROPLASTY;  Surgeon: WYvette Rack, MD;  Location: [facility]  Service: Orthopedics;  Laterality: Right;  ? TOTAL KNEE ARTHROPLASTY Left 04/02/2014  ? Procedure: TOTAL KNEE ARTHROPLASTY;  Surgeon: WYvette Rack, MD;  Location: [facility]  Service: Orthopedics;  Laterality: Left;  ? ? ?Prior to Admission medications   ?Medication Sig Start Date End Date Taking? Authorizing Provider  ?albuterol (PROVENTIL HFA;VENTOLIN HFA) 108 (90 Base) MCG/ACT inhaler Inhale 2 puffs into the lungs every 4 (four) hours as needed for wheezing or shortness of breath. 09/22/16  Yes Francine Graven, DO  ?chlorthalidone (HYGROTON) 25 MG tablet Take 25 mg by mouth daily.   Yes [provider]  ?cyclobenzaprine (FLEXERIL) 10 MG tablet Take 1 tablet (10 mg total) by mouth 3 (three) times daily as needed for muscle spasms. ?Patient taking differently: Take 10 mg by mouth 3 (three) times daily. 10/06/19  Yes Meyran, Ocie Cornfield, NP  ?diphenhydrAMINE (BENADRYL) 25  MG tablet Take 25 mg by mouth every 6 (six) hours as needed for allergies.   Yes [provider]  ?ELDERBERRY PO Take 2 tablets by mouth daily. In the fall/winter   Yes [provider]  ?levothyroxine (SYNTHROID) 137 MCG tablet Take 137 mcg by mouth daily before breakfast.   Yes [provider]  ?linaclotide (LINZESS) 290 MCG CAPS capsule TAKE (1) CAPSULE BY MOUTH DAILY BEFORE BREAKFAST 04/11/21  Yes Mahala Menghini, PA-C  ?losartan (COZAAR) 100 MG tablet Take 100 mg by mouth daily.   Yes [provider]  ?metoprolol tartrate (LOPRESSOR) 50 MG tablet Take 1 tablet (50 mg total) by mouth 2 (two) times daily. 04/09/19  Yes Herminio Commons, MD  ?morphine (MS CONTIN) 30 MG 12 hr tablet Take 30 mg by mouth every 12 (twelve) hours.   Yes [provider]  ?morphine (MSIR) 15 MG tablet Take 30 mg by mouth every 8 (eight) hours as needed for severe pain. 05/15/16  Yes [provider]  ?omeprazole (PRILOSEC) 40 MG capsule Take 40 mg by mouth daily.   Yes [provider]  ?polyethylene glycol-electrolytes (TRILYTE) 420 g solution Take 4,000 mLs by mouth as directed. 11/22/21  Yes Eloise Harman, DO  ?simvastatin (ZOCOR) 20 MG tablet Take 20 mg by mouth at bedtime.  11/24/10  Yes [provider]  ?zolpidem (AMBIEN) 5 MG tablet Take 5 mg by mouth at bedtime as needed for sleep.  02/13/14  Yes [provider]  ?aspirin EC 81 MG tablet Take 1 tablet (81 mg total) by mouth daily. 07/10/17   Imogene Burn, PA-C  ?clobetasol (TEMOVATE) 0.05 % external solution Apply 1 application topically 2 (two) times daily. 06/24/17   [provider]  ?desonide (DESOWEN) 0.05 % cream Apply 1 application topically 2 (two) times daily as needed (for psoriasis on skin).  04/11/17   [provider]  ?diclofenac sodium (VOLTAREN) 1 % GEL Apply 2 g topically 3 (three) times daily as needed (for knee pain/arthritis). 05/10/16   [provider]   ?MOVANTIK 25 MG TABS tablet Take 25 mg by mouth daily. As needed 06/11/17   [provider]  ?nitroGLYCERIN (NITROSTAT) 0.4 MG SL tablet Place 0.4 mg under the tongue every 5 (five) minutes as needed for chest pain.    [provider]  ? ? ?Allergies as of 11/22/2021 - Review Complete 11/22/2021  ?Allergen Reaction Noted  ? Iohexol Shortness Of Breath and Swelling 08/15/2007  ? Nubain [nalbuphine hcl] Shortness Of Breath and Other (See Comments) 12/13/2010  ? Penicillins Itching, Rash, and Other (See Comments)   ? Iodinated contrast media Swelling 01/04/2014  ? Latex Rash 06/12/2012  ? ? ?Family History  ?Problem Relation Age of Onset  ? Ulcers Brother   ? Heart  attack Brother   ? Ulcers Sister   ? Thyroid disease Daughter   ? Hypertension Daughter   ? Other Daughter   ?     nervous  ? Hypertension Sister   ? Heart disease Sister   ? Thyroid disease Sister   ? Hypertension Sister   ? Heart disease Sister   ? Thyroid disease Sister   ? Heart attack Brother   ? Cancer Brother   ?     prostate  ? ? ?Social History  ? ?Socioeconomic History  ? Marital status: Married  ?  Spouse name: Not on file  ? Number of children: 1  ? Years of education: Not on file  ? Highest education level: Not on file  ?Occupational History  ? Occupation: Education officer, museum  ?  Employer: [employer]  ?Tobacco Use  ? Smoking status: Former  ?  Packs/day: 1.00  ?  Years: 22.00  ?  Pack years: 22.00  ?  Types: Cigarettes  ?  Start date: 08/06/1981  ?  Quit date: 01/05/2004  ?  Years since quitting: 17.9  ? Smokeless tobacco: Never  ? Tobacco comments:  ?  Quit in 2005  ?Vaping Use  ? Vaping Use: Never used  ?Substance and Sexual Activity  ? Alcohol use: No  ?  Alcohol/week: 0.0 standard drinks  ? Drug use: No  ? Sexual activity: Not on file  ?  Comment: hyst  ?Other Topics Concern  ? Not on file  ?Social History Narrative  ? Not on file  ? ?Social Determinants of Health  ? ?Financial Resource Strain: Not on file  ?Food Insecurity: Not  on file  ?Transportation Needs: Not on file  ?Physical Activity: Not on file  ?Stress: Not on file  ?Social Connections: Not on file  ?Intimate Partner Violence: Not on file  ? ? ?Review of Systems: ?See HPI

## 2021-12-18 NOTE — Anesthesia Postprocedure Evaluation (Signed)
Anesthesia Post Note ? ?Patient: KENIESHA ADDERLY ? ?Procedure(s) Performed: COLONOSCOPY WITH PROPOFOL ?ESOPHAGOGASTRODUODENOSCOPY (EGD) WITH PROPOFOL ?BIOPSY ? ?Patient location during evaluation: Phase II ?Anesthesia Type: General ?Level of consciousness: awake and alert and oriented ?Pain management: pain level controlled ?Vital Signs Assessment: post-procedure vital signs reviewed and stable ?Respiratory status: spontaneous breathing, nonlabored ventilation and respiratory function stable ?Cardiovascular status: blood pressure returned to baseline and stable ?Postop Assessment: no apparent nausea or vomiting ?Anesthetic complications: no ? ? ?No notable events documented. ? ? ?Last Vitals:  ?Vitals:  ? 12/18/21 1035 12/18/21 1117  ?BP: (!) 152/79 101/75  ?Pulse: (!) 113 73  ?Resp: 16 15  ?Temp: 36.7 ?C   ?SpO2:  93%  ?  ?Last Pain:  ?Vitals:  ? 12/18/21 1122  ?TempSrc:   ?PainSc: 0-No pain  ? ? ?  ?  ?  ?  ?  ?  ? ?Ibtisam Benge C Alvenia Treese ? ? ? ? ?

## 2021-12-18 NOTE — Anesthesia Preprocedure Evaluation (Signed)
Anesthesia Evaluation  ?Patient identified by MRN, date of birth, ID band ?Patient awake ? ? ? ?Reviewed: ?Allergy & Precautions, NPO status , Patient's Chart, lab work & pertinent test results, reviewed documented beta blocker date and time  ? ?History of Anesthesia Complications ?Negative for: history of anesthetic complications ? ?Airway ?Mallampati: II ? ?TM Distance: >3 FB ?Neck ROM: Full ? ? ? Dental ? ?(+) Missing, Poor Dentition, Chipped, Dental Advisory Given ?  ?Pulmonary ?asthma , pneumonia, COPD,  COPD inhaler, former smoker,  ?  ?Pulmonary exam normal ?breath sounds clear to auscultation ? ? ? ? ? ? Cardiovascular ?hypertension, Pt. on medications and Pt. on home beta blockers ?+ CAD and + Past MI  ? ?Rhythm:Regular Rate:Tachycardia ? ? ?  ?Neuro/Psych ? Headaches, PSYCHIATRIC DISORDERS Depression TIA  ? GI/Hepatic ?Neg liver ROS, hiatal hernia, PUD, GERD  Medicated and Controlled,  ?Endo/Other  ?Hypothyroidism  ? Renal/GU ?negative Renal ROS  ?Female GU complaint (cervical cancer) ? ? ?  ?Musculoskeletal ? ?(+) Arthritis ,  ? Abdominal ?  ?Peds ?negative pediatric ROS ?(+)  Hematology ? ?(+) Blood dyscrasia, anemia ,   ?Anesthesia Other Findings ? ? Reproductive/Obstetrics ?negative OB ROS ? ?  ? ? ? ? ? ? ? ? ? ? ? ? ? ?  ?  ? ? ? ? ? ? ? ? ?Anesthesia Physical ?Anesthesia Plan ? ?ASA: 3 ? ?Anesthesia Plan: General  ? ?Post-op Pain Management: Minimal or no pain anticipated  ? ?Induction: Intravenous ? ?PONV Risk Score and Plan: Propofol infusion ? ?Airway Management Planned: Nasal Cannula and Natural Airway ? ?Additional Equipment:  ? ?Intra-op Plan:  ? ?Post-operative Plan:  ? ?Informed Consent: I have reviewed the patients History and Physical, chart, labs and discussed the procedure including the risks, benefits and alternatives for the proposed anesthesia with the patient or authorized representative who has indicated his/her understanding and acceptance.   ? ? ? ?Dental advisory given ? ?Plan Discussed with: CRNA and Surgeon ? ?Anesthesia Plan Comments:   ? ? ? ? ? ? ?Anesthesia Quick Evaluation ? ?

## 2021-12-19 LAB — SURGICAL PATHOLOGY

## 2021-12-25 ENCOUNTER — Encounter (HOSPITAL_COMMUNITY): Payer: Self-pay | Admitting: Internal Medicine

## 2022-01-14 ENCOUNTER — Other Ambulatory Visit: Payer: Self-pay | Admitting: Gastroenterology

## 2022-03-20 ENCOUNTER — Encounter: Payer: Self-pay | Admitting: *Deleted

## 2022-05-07 ENCOUNTER — Telehealth: Payer: Self-pay

## 2022-05-07 NOTE — Telephone Encounter (Signed)
Pt called and stated that the Linzess 290 mcg is not working for her. I asked how long has this been going on and she stated for some time. She couldn't give a time frame. She also stated she hasn't had a BM since the end of August. I asked the pt was it none or not complete. She states not complete she just does a little when she goes. I asked her was she drinking enough fluids but couldn't get a answer. She states she thought it came from her not moving around as much. Please advise. Pt then wanted to be transferred to the front desk for an appt to be seen.

## 2022-05-09 NOTE — Telephone Encounter (Signed)
Needs OV with app or myself thanks

## 2022-05-10 ENCOUNTER — Encounter: Payer: Self-pay | Admitting: Gastroenterology

## 2022-05-10 ENCOUNTER — Ambulatory Visit: Payer: BC Managed Care – PPO | Admitting: Gastroenterology

## 2022-05-10 VITALS — BP 121/53 | HR 117 | Temp 98.1°F | Ht 65.0 in | Wt 205.2 lb

## 2022-05-10 DIAGNOSIS — D509 Iron deficiency anemia, unspecified: Secondary | ICD-10-CM | POA: Diagnosis not present

## 2022-05-10 DIAGNOSIS — K219 Gastro-esophageal reflux disease without esophagitis: Secondary | ICD-10-CM | POA: Diagnosis not present

## 2022-05-10 DIAGNOSIS — K5900 Constipation, unspecified: Secondary | ICD-10-CM

## 2022-05-10 DIAGNOSIS — R1319 Other dysphagia: Secondary | ICD-10-CM

## 2022-05-10 MED ORDER — LUBIPROSTONE 24 MCG PO CAPS: 24.0000 ug | ORAL_CAPSULE | Freq: Two times a day (BID) | ORAL | 2 refills | Status: DC

## 2022-05-10 NOTE — Telephone Encounter (Signed)
Pt has appt today to see Loma Sousa

## 2022-05-10 NOTE — Patient Instructions (Addendum)
For your constipation: Continue drinking at least 4-6 bottles of water daily. Try increasing activity, with at least 15 minutes of exercise daily. I have sent in Amitiza 24 mcg for you to take twice daily, you should take this with food to avoid any nausea. Stop taking your Movantik if it is causing abdominal pain. Stop taking Linzess once you pick up your Amitiza. Also when she began taking Benefiber, 2-3 teaspoons daily.  For your iron deficiency: Want you to start taking an over-the-counter oral iron supplement, once daily.  You may take this at night prior to going to bed or take with food to avoid nausea. Do not be alarmed, this can cause your stools to look darker. We will recheck your labs in about 1 month.  For your GERD: Continue taking omeprazole 40 mg daily Follow a GERD diet:  Avoid fried, fatty, greasy, spicy, citrus foods. Avoid caffeine and carbonated beverages. Avoid chocolate. Try eating 4-6 small meals a day rather than 3 large meals. Do not eat within 3 hours of laying down. Prop head of bed up on wood or bricks to create a 6 inch incline.  I would recommend yearly fecal occult blood testing or Cologuard every 3 years until age 24.   We will follow-up with you in 3 months, we can do this virtually if it is more convenient.  It was a pleasure to see you today. I want to create trusting relationships with patients. If you receive a survey regarding your visit,  I greatly appreciate you taking time to fill this out on paper or through your MyChart. I value your feedback.  Venetia Night, MSN, FNP-BC, AGACNP-BC [facility]

## 2022-05-10 NOTE — Progress Notes (Signed)
GI Office Note    Referring Provider: Asencion Noble, MD Primary Care Physician:  Asencion Noble, MD Primary Gastroenterologist: Dr. Abbey Chatters  Date:  05/10/2022  ID:  Selena [name], DOB 1950-08-24, MRN [id number]   Chief Complaint   Chief Complaint  Patient presents with   Follow-up    Medication was not working. Little bit of constipation.     History of Present Illness  Selena [name] is a 71 y.o. female with a history of asthma, COPD, depression, hypothyroidism, HLD, MI in 2000, TIA in 1983, peptic ulcer disease, chronic GERD, IDA, IBS-C presenting today with complaint of constipation.  Last office visit 11/22/2021 for follow-up.  History of chronic GERD, well-controlled omeprazole 40 mg daily with occasional breakthrough symptoms.  Noted progressively worsening dysphagia primarily with solids, getting stuck in the substernal region.  Noted to be due for colonoscopy.  No family history of colorectal malignancy, melena, or hematochezia.  Noted history of iron deficiency anemia.  Linzess 290 mcg daily was working well.  Scheduled for EGD with possible dilation and colonoscopy.  Check CBC and iron studies, continue Linzess 290 mcg daily, and omeprazole 40 mg daily.  EGD colonoscopy performed 12/18/2021.  EGD with gastritis s/p biopsy, normal duodenum, no evidence of esophageal abnormality, no dilation performed.  Advised to continue omeprazole 40 mg daily.  Colonoscopy with nonbleeding internal hemorrhoids, sigmoid diverticulosis and inadequate prep with stool throughout the entire colon.  Advised to repeat colonoscopy in 6 months due to poor bowel prep.  Today: Has been on Linzess 290 mcg daily but recently she has been having Bristol 1 stools  three times per week. Having to strain. Does have hemorrhoids and not having any bleeding. Has been taking the movantik and Linzess together. She feels as though the movantik is causing the pain. Does not want to take the Movantik anymore. Has tried  corectal, miralax in th e past as well. No abdominal pain or bloating. No blood in her stool. Has not had much of an appetite and has had some weight loss. Wants to stop movantik. Drinks about 4 or more water bottles per day. Has tried metamucil in the past. Not exercising as much as she used to.   Having an air bubble that she feels stuck mid chest. Still feeling like food gets stuck in her throat but is improved from prior.   Reports she had COVID a few weeks ago and has been stressed.   Not interested in pursing another colonoscopy given she has had many attempts.    Current Outpatient Medications  Medication Sig Dispense Refill   albuterol (PROVENTIL HFA;VENTOLIN HFA) 108 (90 Base) MCG/ACT inhaler Inhale 2 puffs into the lungs every 4 (four) hours as needed for wheezing or shortness of breath. 1 Inhaler 0   aspirin EC 81 MG tablet Take 1 tablet (81 mg total) by mouth daily. 90 tablet 3   chlorthalidone (HYGROTON) 25 MG tablet Take 25 mg by mouth daily.     clobetasol (TEMOVATE) 0.05 % external solution Apply 1 application topically 2 (two) times daily.  3   cyclobenzaprine (FLEXERIL) 10 MG tablet Take 1 tablet (10 mg total) by mouth 3 (three) times daily as needed for muscle spasms. (Patient taking differently: Take 10 mg by mouth 3 (three) times daily.) 30 tablet 0   desonide (DESOWEN) 0.05 % cream Apply 1 application topically 2 (two) times daily as needed (for psoriasis on skin).   3   diclofenac sodium (VOLTAREN) 1 % GEL  Apply 2 g topically 3 (three) times daily as needed (for knee pain/arthritis).     levothyroxine (SYNTHROID) 137 MCG tablet Take 137 mcg by mouth daily before breakfast.     LINZESS 290 MCG CAPS capsule TAKE (1) CAPSULE BY MOUTH DAILY BEFORE BREAKFAST 90 capsule 3   losartan (COZAAR) 100 MG tablet Take 100 mg by mouth daily.     metoprolol tartrate (LOPRESSOR) 50 MG tablet Take 1 tablet (50 mg total) by mouth 2 (two) times daily. 180 tablet 3   morphine (MS CONTIN) 30  MG 12 hr tablet Take 30 mg by mouth every 12 (twelve) hours.     morphine (MSIR) 15 MG tablet Take 30 mg by mouth every 8 (eight) hours as needed for severe pain.     MOVANTIK 25 MG TABS tablet Take 25 mg by mouth daily. As needed     nitroGLYCERIN (NITROSTAT) 0.4 MG SL tablet Place 0.4 mg under the tongue every 5 (five) minutes as needed for chest pain.     omeprazole (PRILOSEC) 40 MG capsule Take 40 mg by mouth daily.     simvastatin (ZOCOR) 20 MG tablet Take 20 mg by mouth at bedtime.      diphenhydrAMINE (BENADRYL) 25 MG tablet Take 25 mg by mouth every 6 (six) hours as needed for allergies. (Patient not taking: Reported on 05/10/2022)     ELDERBERRY PO Take 2 tablets by mouth daily. In the fall/winter (Patient not taking: Reported on 05/10/2022)     zolpidem (AMBIEN) 5 MG tablet Take 5 mg by mouth at bedtime as needed for sleep.  (Patient not taking: Reported on 05/10/2022)     No current facility-administered medications for this visit.    Past Medical History:  Diagnosis Date   Abnormal findings on imaging of biliary tract 1997-2000   CBD 62m, Dr PAlbin Fellingbiliary/pancreatic manometry, EUS Dr. JArdis Hughs2/12/09 dilated but otherwise normal extrahepatic duct, no masses   Arthritis    Asthma    Bursitis    Cervical ca ([facility] 1984   Chronic abdominal pain    Functional, seen [facility] , extensive WU here benign    Chronic back pain    On methadone   Constipation    COPD [place]chronic obstructive [facility] disease) [place]    Depression    w/ menopause   Essential hypertension    GERD (gastroesophageal reflux disease)    H/O hiatal hernia    Headache    Hx of migraines in past   Hemorrhoids    Hyperlipemia    Hypothyroidism    MI (myocardial infarction) ([facility] 2000   Details not clear   Palpitations    PVCs   Pneumonia    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 FDonald Poresec to tortuous colon,  followed by [facility] & flex sig normal   Skin cancer    Nose   TIA (transient ischemic attack) 1983    Past Surgical History:  Procedure Laterality Date   ABDOMINAL EXPOSURE N/A 10/05/2019   Procedure: ABDOMINAL EXPOSURE;  Surgeon: CMarty Heck MD;  [facility]  Service: Vascular;  Laterality: N/A;   ABDOMINAL HYSTERECTOMY     ANTERIOR LUMBAR FUSION N/A 10/05/2019   Procedure: Anterior Lumbar Interbody Fusion - Lumbar Five-Sacral One;  Surgeon: CKary Kos MD;  [facility]  Service: Neurosurgery;  Laterality: N/A;  Anterior Lumbar Interbody Fusion - Lumbar Five-Sacral One   APPENDECTOMY  arthroscopic knee     BACK SURGERY     BIOPSY  12/18/2021   Procedure: BIOPSY;  Surgeon: Eloise Harman, DO;  [facility];  Service: Endoscopy;;   CARDIAC CATHETERIZATION  2005   CATARACT EXTRACTION W/PHACO Right 09/02/2014   Procedure: CATARACT EXTRACTION PHACO AND INTRAOCULAR LENS PLACEMENT RIGHT EYE;  Surgeon: Tonny Branch, MD;  Location: [facility];  Service: Ophthalmology;  Laterality: Right;  CDE:8.96   CATARACT EXTRACTION W/PHACO Left 09/13/2014   Procedure: CATARACT EXTRACTION PHACO AND INTRAOCULAR LENS PLACEMENT LEFT EYE;  Surgeon: Tonny Branch, MD;  Location: [facility];  Service: Ophthalmology;  Laterality: Left;  CDE 6.20   CHOLECYSTECTOMY     COLONOSCOPY WITH PROPOFOL N/A 12/18/2021   Procedure: COLONOSCOPY WITH PROPOFOL;  Surgeon: Eloise Harman, DO;  [facility];  Service: Endoscopy;  Laterality: N/A;  12:15pm   ESOPHAGOGASTRODUODENOSCOPY (EGD) WITH PROPOFOL N/A 12/18/2021   Procedure: ESOPHAGOGASTRODUODENOSCOPY (EGD) WITH PROPOFOL;  Surgeon: Eloise Harman, DO;  [facility];  Service: Endoscopy;  Laterality: N/A;   EYE SURGERY Bilateral    cataract removal   INGUINAL HERNIA REPAIR     left   JOINT REPLACEMENT Bilateral    knees   LEFT HEART CATH AND CORONARY ANGIOGRAPHY N/A 10/19/2020   Procedure: LEFT HEART CATH AND CORONARY ANGIOGRAPHY;   Surgeon: Belva Crome, MD;  Location: [facility];  Service: Cardiovascular;  Laterality: N/A;   PARTIAL HYSTERECTOMY     right arm     nerve   TONSILLECTOMY     TONSILLECTOMY     TOTAL KNEE ARTHROPLASTY  06/20/2012   Procedure: TOTAL KNEE ARTHROPLASTY;  Surgeon: Yvette Rack., MD;  Location: [facility];  Service: Orthopedics;  Laterality: Right;   TOTAL KNEE ARTHROPLASTY Left 04/02/2014   Procedure: TOTAL KNEE ARTHROPLASTY;  Surgeon: Yvette Rack., MD;  Location: [facility];  Service: Orthopedics;  Laterality: Left;    Family History  Problem Relation Age of Onset   Ulcers Brother    Heart attack Brother    Ulcers Sister    Thyroid disease Daughter    Hypertension Daughter    Other Daughter        nervous   Hypertension Sister    Heart disease Sister    Thyroid disease Sister    Hypertension Sister    Heart disease Sister    Thyroid disease Sister    Heart attack Brother    Cancer Brother        prostate    Allergies as of 05/10/2022 - Review Complete 05/10/2022  Allergen Reaction Noted   Iohexol Shortness Of Breath and Swelling 08/15/2007   Nubain [nalbuphine hcl] Shortness Of Breath and Other (See Comments) 12/13/2010   Penicillins Itching, Rash, and Other (See Comments)    Iodinated contrast media Swelling 01/04/2014   Latex Rash 06/12/2012    Social History   Socioeconomic History   Marital status: Married    Spouse name: Not on file   Number of children: 1   Years of education: Not on file   Highest education level: Not on file  Occupational History   Occupation: Education officer, museum    Employer: [employer]  Tobacco Use   Smoking status: Former    Packs/day: 1.00    Years: 22.00    Total pack years: 22.00    Types: Cigarettes    Start date: 08/06/1981    Quit date: 01/05/2004    Years since quitting:  18.3   Smokeless tobacco: Never   Tobacco comments:    Quit in 2005  Vaping Use   Vaping Use: Never used  Substance and Sexual Activity   Alcohol use:  No    Alcohol/week: 0.0 standard drinks of alcohol   Drug use: No   Sexual activity: Not on file    Comment: hyst  Other Topics Concern   Not on file  Social History Narrative   Not on file   Social Determinants of Health   Financial Resource Strain: Not on file  Food Insecurity: Not on file  Transportation Needs: Not on file  Physical Activity: Not on file  Stress: Not on file  Social Connections: Not on file     Review of Systems   Gen: Denies fever, chills, anorexia. Denies fatigue, weakness, weight loss.  CV: Denies chest pain, palpitations, syncope, peripheral edema, and claudication. Resp: Denies dyspnea at rest, cough, wheezing, coughing up blood, and pleurisy. GI: See HPI Derm: Denies rash, itching, dry skin Psych: Denies depression, anxiety, memory loss, confusion. No homicidal or suicidal ideation.  Heme: Denies bruising, bleeding, and enlarged lymph nodes.   Physical Exam   BP (!) 121/53 (BP Location: Right Arm, Patient Position: Sitting, Cuff Size: Normal)   Pulse (!) 117   Temp 98.1 F (36.7 C) (Temporal)   Ht '5\' 5"'$  (1.651 m)   Wt 205 lb 3.2 oz (93.1 kg)   SpO2 96%   BMI 34.15 kg/m   General:   Alert and oriented. No distress noted. Pleasant and cooperative.  Head:  Normocephalic and atraumatic. Eyes:  Conjuctiva clear without scleral icterus. Mouth:  Oral mucosa pink and moist. Good dentition. No lesions. Lungs:  Clear to auscultation bilaterally. No wheezes, rales, or rhonchi. No distress.  Heart:  S1, S2 present without murmurs appreciated.  Abdomen:  +BS, soft, non-tender and non-distended. No rebound or guarding. No HSM or masses noted. Rectal: Deferred Extremities:  Without edema. Neurologic:  Alert and  oriented x4 Psych:  Alert and cooperative. Normal mood and affect.   Assessment  Selena [name] is a 71 y.o. female with a history of  asthma, COPD, depression, hypothyroidism, HLD, MI in 2000, TIA in 1983, peptic ulcer disease, chronic  GERD, IDA, IBS-C presenting today with complaint of constipation.  Constipation: Had previously been on 290 mcg daily and Linzess with good results.  She had an adequate bowel prep for her colonoscopy in May 2023.  She reports that since her last visit in April she has been having Bristol 1 stools about 3 times per week, need to strain.  Denies any abdominal pain or bloating.  Also denies any melena or hematochezia.  Does note some lack of appetite and some weight loss.  She reported abdominal cramping with Movantik therefore she stopped taking it.  She states an adequate amount of water intake with formula bottles of water per day.  Is not exercising as much she used to and has tried Metamucil in the past without much relief.  Given inadequate results now with Linzess and intolerance to Movantik, we will trial Amitiza 24 mcg twice daily.  GERD/dysphagia: Fairly well controlled on omeprazole 40 mg daily, has occasional breakthrough symptoms.  Reports occasional dysphagia with solids but is improved since her EGD despite no record of dilation.  Symptoms are not occurring on a frequent basis.  Her most complaint is feeling like gas or air is stuck in her mid chest.  Iron-deficiency anemia: Last labs in May 2023  with hemoglobin 11, MCV 71.5, platelets 352, iron low at 24, ferritin low at 5, saturation low at 4%. Not on any iron currently.  Most recent EGD with gastritis, normal duodenum, and normal esophagus.  Colonoscopy noted nonbleeding internal hemorrhoids and sigmoid diverticulosis however she had an adequate bowel prep and repeat recommended in 6 months. She is not interested in pursuing another colonoscopy. Advised to start over-the-counter oral iron, this could worsen her constipation.  We will repeat CBC and iron panel in about 1 month.   Screening for colon cancer: Attempted colonoscopy in May with inadequate prep, stool noted throughout the colon.  Sigmoid diverticulosis and internal hemorrhoids  present.  Not interested in pursuing repeat colonoscopy despite recommendation for further investigation of iron deficiency anemia and that we may be missing a potential malignancy.  We discussed pursuing yearly FOBT testing or at least Cologuard testing, noting that with positive, colonoscopy would be recommended.  PLAN   Start oral iron over the counter daily.  CBC and iron panel in 1 month Benefiber 2-3 teaspoons daily.  Stop Movantik.  Stop Linzess whenever you pick up Amitiza. Start Amitiza 24 mcg twice daily, take with food.  Continue omeprazole 40 mg daily, 30 minutes prior to breakfast Increase exercise, work toward 15 minutes daily. Follow-up in 3 months, okay for virtual Should pursue yearly FOBT testing or Cologuard every 3 years until age 16 for colon cancer screening, she will consider.  Venetia Night, MSN, FNP-BC, AGACNP-BC [facility]

## 2022-05-23 ENCOUNTER — Telehealth: Payer: Self-pay | Admitting: *Deleted

## 2022-05-23 ENCOUNTER — Other Ambulatory Visit: Payer: Self-pay | Admitting: *Deleted

## 2022-05-23 DIAGNOSIS — D509 Iron deficiency anemia, unspecified: Secondary | ICD-10-CM

## 2022-05-23 NOTE — Telephone Encounter (Signed)
Mailed lab requisitions.  

## 2022-06-06 ENCOUNTER — Telehealth: Payer: Self-pay | Admitting: *Deleted

## 2022-06-06 ENCOUNTER — Other Ambulatory Visit: Payer: Self-pay | Admitting: *Deleted

## 2022-06-06 DIAGNOSIS — D509 Iron deficiency anemia, unspecified: Secondary | ICD-10-CM

## 2022-06-06 NOTE — Telephone Encounter (Signed)
Mailed lab requisitions.

## 2022-06-19 LAB — CBC
HCT: 42.8 % (ref 35.0–45.0)
Hemoglobin: 13.2 g/dL (ref 11.7–15.5)
MCH: 23.6 pg — ABNORMAL LOW (ref 27.0–33.0)
MCHC: 30.8 g/dL — ABNORMAL LOW (ref 32.0–36.0)
MCV: 76.6 fL — ABNORMAL LOW (ref 80.0–100.0)
MPV: 10.6 fL (ref 7.5–12.5)
Platelets: 239 10*3/uL (ref 140–400)
RBC: 5.59 10*6/uL — ABNORMAL HIGH (ref 3.80–5.10)
WBC: 6.1 10*3/uL (ref 3.8–10.8)

## 2022-06-19 LAB — IRON,TIBC AND FERRITIN PANEL
%SAT: 10 % (calc) — ABNORMAL LOW (ref 16–45)
Ferritin: 19 ng/mL (ref 16–288)
Iron: 52 ug/dL (ref 45–160)
TIBC: 506 mcg/dL (calc) — ABNORMAL HIGH (ref 250–450)

## 2022-07-07 ENCOUNTER — Other Ambulatory Visit: Payer: Self-pay | Admitting: Gastroenterology

## 2022-07-07 DIAGNOSIS — K5900 Constipation, unspecified: Secondary | ICD-10-CM

## 2022-08-01 ENCOUNTER — Telehealth: Payer: Self-pay | Admitting: Gastroenterology

## 2022-08-01 NOTE — Telephone Encounter (Signed)
Labs performed 06/19/2022:  Hemoglobin 13.2, hematocrit 42.8, MCV 76.6, platelets 239  Ferritin 19  Plan to continue daily oral iron supplement.  Ferritin has improved from 5-19.  Patient declined repeating colonoscopy after last visit despite recommendation to perform given inadequate bowel prep.  Leafy Ro -please call patient and offer a follow-up visit in January.  Was to have a 49-monthfollow-up after last visit in October.  CVenetia Night MSN, APRN, FNP-BC, AGACNP-BC [facility]

## 2022-08-30 ENCOUNTER — Other Ambulatory Visit (HOSPITAL_COMMUNITY): Payer: Self-pay | Admitting: Internal Medicine

## 2022-08-30 DIAGNOSIS — Z1231 Encounter for screening mammogram for malignant neoplasm of breast: Secondary | ICD-10-CM

## 2022-08-31 ENCOUNTER — Telehealth: Payer: Self-pay

## 2022-08-31 NOTE — Telephone Encounter (Signed)
Refill request for Linzess has been received from [facility]. Pharmacy states that the one that was on file was inactivated. Pt was last seen on 05/10/2022.

## 2022-08-31 NOTE — Telephone Encounter (Signed)
Lmom for pt to return my call

## 2022-09-03 NOTE — Telephone Encounter (Signed)
Lmom for pt to return my call.  

## 2022-09-04 NOTE — Telephone Encounter (Signed)
Unable to reach pt by phone.

## 2022-09-10 ENCOUNTER — Ambulatory Visit: Payer: BC Managed Care – PPO | Admitting: Gastroenterology

## 2022-09-18 ENCOUNTER — Other Ambulatory Visit: Payer: Self-pay | Admitting: Orthopedic Surgery

## 2022-09-18 DIAGNOSIS — M5412 Radiculopathy, cervical region: Secondary | ICD-10-CM

## 2022-09-18 DIAGNOSIS — M79602 Pain in left arm: Secondary | ICD-10-CM

## 2022-10-01 ENCOUNTER — Ambulatory Visit (HOSPITAL_COMMUNITY): Payer: BC Managed Care – PPO

## 2022-10-05 DIAGNOSIS — M48061 Spinal stenosis, lumbar region without neurogenic claudication: Secondary | ICD-10-CM

## 2022-10-05 HISTORY — DX: Spinal stenosis, lumbar region without neurogenic claudication: M48.061

## 2022-10-06 ENCOUNTER — Other Ambulatory Visit: Payer: BC Managed Care – PPO

## 2022-10-08 ENCOUNTER — Ambulatory Visit (HOSPITAL_COMMUNITY): Payer: BC Managed Care – PPO

## 2022-10-21 ENCOUNTER — Inpatient Hospital Stay: Admission: RE | Admit: 2022-10-21 | Payer: BC Managed Care – PPO | Source: Ambulatory Visit

## 2022-10-22 ENCOUNTER — Ambulatory Visit (HOSPITAL_COMMUNITY)
Admission: RE | Admit: 2022-10-22 | Discharge: 2022-10-22 | Disposition: A | Discharge status: Home / Self Care | Payer: BC Managed Care – PPO | Source: Ambulatory Visit | Attending: Internal Medicine | Admitting: Internal Medicine

## 2022-10-22 DIAGNOSIS — Z1231 Encounter for screening mammogram for malignant neoplasm of breast: Secondary | ICD-10-CM | POA: Diagnosis present

## 2022-11-06 ENCOUNTER — Ambulatory Visit
Admission: RE | Admit: 2022-11-06 | Discharge: 2022-11-06 | Disposition: A | Discharge status: Home / Self Care | Payer: BC Managed Care – PPO | Source: Ambulatory Visit | Attending: Orthopedic Surgery | Admitting: Orthopedic Surgery

## 2022-11-06 DIAGNOSIS — M79602 Pain in left arm: Secondary | ICD-10-CM

## 2022-11-06 DIAGNOSIS — M5412 Radiculopathy, cervical region: Secondary | ICD-10-CM

## 2023-03-29 ENCOUNTER — Other Ambulatory Visit: Payer: Self-pay | Admitting: Neurosurgery

## 2023-04-05 NOTE — Pre-Procedure Instructions (Signed)
Surgical Instructions   Your procedure is scheduled on April 15, 2023. Report to [facility] Main Entrance "A" at 8:20 A.M., then check in with the Admitting office. Any questions or running late day of surgery: call [phone]  Questions prior to your surgery date: call [phone], Monday-Friday, 8am-4pm. If you experience any cold or flu symptoms such as cough, fever, chills, shortness of breath, etc. between now and your scheduled surgery, please notify us at the above number.     Remember:  Do not eat or drink after midnight the night before your surgery    Take these medicines the morning of surgery with A SIP OF WATER: levothyroxine (SYNTHROID)  metoprolol tartrate (LOPRESSOR)  morphine (MS CONTIN)  omeprazole (PRILOSEC)    May take these medicines IF NEEDED: albuterol (PROVENTIL HFA;VENTOLIN HFA) inhaler  cyclobenzaprine (FLEXERIL)  morphine (MSIR)  nitroGLYCERIN (NITROSTAT) - please call [phone] if dose taken prior to surgery.   Follow your surgeon's instructions on when to stop Aspirin.  If no instructions were given by your surgeon then you will need to call the office to get those instructions.     One week prior to surgery, STOP taking any Aleve, Naproxen, Ibuprofen, Motrin, Advil, Goody's, BC's, all herbal medications, fish oil, and non-prescription vitamins. This includes your medication: diclofenac sodium (VOLTAREN) GEL                      Do NOT Smoke (Tobacco/Vaping) for 24 hours prior to your procedure.  If you use a CPAP at night, you may bring your mask/headgear for your overnight stay.   You will be asked to remove any contacts, glasses, piercing's, hearing aid's, dentures/partials prior to surgery. Please bring cases for these items if needed.    Patients discharged the day of surgery will not be allowed to drive home, and someone needs to stay with them for 24 hours.  SURGICAL WAITING ROOM VISITATION Patients may have no more than 2  support people in the waiting area - these visitors may rotate.   Pre-op nurse will coordinate an appropriate time for 1 ADULT support person, who may not rotate, to accompany patient in pre-op.  Children under the age of 16 must have an adult with them who is not the patient and must remain in the main waiting area with an adult.  If the patient needs to stay at the hospital during part of their recovery, the visitor guidelines for inpatient rooms apply.  Please refer to the [facility] website for the visitor guidelines for any additional information.   If you received a COVID test during your pre-op visit  it is requested that you wear a mask when out in public, stay away from anyone that may not be feeling well and notify your surgeon if you develop symptoms. If you have been in contact with anyone that has tested positive in the last 10 days please notify you surgeon.      Pre-operative 5 CHG Bathing Instructions   You can play a key role in reducing the risk of infection after surgery. Your skin needs to be as free of germs as possible. You can reduce the number of germs on your skin by washing with CHG (chlorhexidine gluconate) soap before surgery. CHG is an antiseptic soap that kills germs and continues to kill germs even after washing.   DO NOT use if you have an allergy to chlorhexidine/CHG or antibacterial soaps. If your skin becomes reddened or irritated, stop  using the CHG and notify one of our RNs at [phone].   Please shower with the CHG soap starting 4 days before surgery using the following schedule:     Please keep in mind the following:  DO NOT shave, including legs and underarms, starting the day of your first shower.   You may shave your face at any point before/day of surgery.  Place clean sheets on your bed the day you start using CHG soap. Use a clean washcloth (not used since being washed) for each shower. DO NOT sleep with pets once you start using the CHG.    CHG Shower Instructions:  If you choose to wash your hair and private area, wash first with your normal shampoo/soap.  After you use shampoo/soap, rinse your hair and body thoroughly to remove shampoo/soap residue.  Turn the water OFF and apply about 3 tablespoons (45 ml) of CHG soap to a CLEAN washcloth.  Apply CHG soap ONLY FROM YOUR NECK DOWN TO YOUR TOES (washing for 3-5 minutes)  DO NOT use CHG soap on face, private areas, open wounds, or sores.  Pay special attention to the area where your surgery is being performed.  If you are having back surgery, having someone wash your back for you may be helpful. Wait 2 minutes after CHG soap is applied, then you may rinse off the CHG soap.  Pat dry with a clean towel  Put on clean clothes/pajamas   If you choose to wear lotion, please use ONLY the CHG-compatible lotions on the back of this paper.   Additional instructions for the day of surgery: DO NOT APPLY any lotions, deodorants, cologne, or perfumes.   Do not bring valuables to the hospital. [facility] is not responsible for any belongings/valuables. Do not wear nail polish, gel polish, artificial nails, or any other type of covering on natural nails (fingers and toes) Do not wear jewelry or makeup Put on clean/comfortable clothes.  Please brush your teeth.  Ask your nurse before applying any prescription medications to the skin.     CHG Compatible Lotions   Aveeno Moisturizing lotion  Cetaphil Moisturizing Cream  Cetaphil Moisturizing Lotion  Clairol Herbal Essence Moisturizing Lotion, Dry Skin  Clairol Herbal Essence Moisturizing Lotion, Extra Dry Skin  Clairol Herbal Essence Moisturizing Lotion, Normal Skin  Curel Age Defying Therapeutic Moisturizing Lotion with Alpha Hydroxy  Curel Extreme Care Body Lotion  Curel Soothing Hands Moisturizing Hand Lotion  Curel Therapeutic Moisturizing Cream, Fragrance-Free  Curel Therapeutic Moisturizing Lotion, Fragrance-Free  Curel  Therapeutic Moisturizing Lotion, Original Formula  Eucerin Daily Replenishing Lotion  Eucerin Dry Skin Therapy Plus Alpha Hydroxy Crme  Eucerin Dry Skin Therapy Plus Alpha Hydroxy Lotion  Eucerin Original Crme  Eucerin Original Lotion  Eucerin Plus Crme Eucerin Plus Lotion  Eucerin TriLipid Replenishing Lotion  Keri Anti-Bacterial Hand Lotion  Keri Deep Conditioning Original Lotion Dry Skin Formula Softly Scented  Keri Deep Conditioning Original Lotion, Fragrance Free Sensitive Skin Formula  Keri Lotion Fast Absorbing Fragrance Free Sensitive Skin Formula  Keri Lotion Fast Absorbing Softly Scented Dry Skin Formula  Keri Original Lotion  Keri Skin Renewal Lotion Keri Silky Smooth Lotion  Keri Silky Smooth Sensitive Skin Lotion  Nivea Body Creamy Conditioning Oil  Nivea Body Extra Enriched Teacher, adult education Moisturizing Lotion Nivea Crme  Nivea Skin Firming Lotion  NutraDerm 30 Skin Lotion  NutraDerm Skin Lotion  NutraDerm Therapeutic Skin Cream  NutraDerm Therapeutic Skin Lotion  ProShield  Protective Hand Cream  Provon moisturizing lotion  Please read over the following fact sheets that you were given.

## 2023-04-09 ENCOUNTER — Other Ambulatory Visit: Payer: Self-pay

## 2023-04-09 ENCOUNTER — Encounter (HOSPITAL_COMMUNITY): Payer: Self-pay

## 2023-04-09 ENCOUNTER — Encounter (HOSPITAL_COMMUNITY)
Admission: RE | Admit: 2023-04-09 | Discharge: 2023-04-09 | Disposition: A | Discharge status: Home / Self Care | Payer: BC Managed Care – PPO | Source: Ambulatory Visit | Attending: Neurosurgery | Admitting: Neurosurgery

## 2023-04-09 VITALS — BP 143/94 | HR 117 | Temp 98.3°F | Resp 18 | Ht 65.0 in | Wt 203.5 lb

## 2023-04-09 DIAGNOSIS — E785 Hyperlipidemia, unspecified: Secondary | ICD-10-CM | POA: Diagnosis not present

## 2023-04-09 DIAGNOSIS — Z01818 Encounter for other preprocedural examination: Secondary | ICD-10-CM | POA: Diagnosis present

## 2023-04-09 DIAGNOSIS — K219 Gastro-esophageal reflux disease without esophagitis: Secondary | ICD-10-CM | POA: Insufficient documentation

## 2023-04-09 DIAGNOSIS — M1990 Unspecified osteoarthritis, unspecified site: Secondary | ICD-10-CM | POA: Insufficient documentation

## 2023-04-09 DIAGNOSIS — E669 Obesity, unspecified: Secondary | ICD-10-CM | POA: Diagnosis not present

## 2023-04-09 DIAGNOSIS — Z6833 Body mass index (BMI) 33.0-33.9, adult: Secondary | ICD-10-CM | POA: Diagnosis not present

## 2023-04-09 DIAGNOSIS — Z8249 Family history of ischemic heart disease and other diseases of the circulatory system: Secondary | ICD-10-CM | POA: Insufficient documentation

## 2023-04-09 DIAGNOSIS — J4489 Other specified chronic obstructive pulmonary disease: Secondary | ICD-10-CM | POA: Diagnosis not present

## 2023-04-09 DIAGNOSIS — G8929 Other chronic pain: Secondary | ICD-10-CM | POA: Diagnosis not present

## 2023-04-09 DIAGNOSIS — M4802 Spinal stenosis, cervical region: Secondary | ICD-10-CM | POA: Insufficient documentation

## 2023-04-09 DIAGNOSIS — E039 Hypothyroidism, unspecified: Secondary | ICD-10-CM | POA: Insufficient documentation

## 2023-04-09 DIAGNOSIS — F1120 Opioid dependence, uncomplicated: Secondary | ICD-10-CM | POA: Diagnosis not present

## 2023-04-09 DIAGNOSIS — Z87891 Personal history of nicotine dependence: Secondary | ICD-10-CM | POA: Insufficient documentation

## 2023-04-09 HISTORY — DX: Dyspnea, unspecified: R06.00

## 2023-04-09 HISTORY — DX: Anemia, unspecified: D64.9

## 2023-04-09 HISTORY — DX: Opioid dependence, uncomplicated: F11.20

## 2023-04-09 HISTORY — DX: Unspecified osteoarthritis, unspecified site: M19.90

## 2023-04-09 LAB — BASIC METABOLIC PANEL
Anion gap: 11 (ref 5–15)
BUN: 19 mg/dL (ref 8–23)
CO2: 28 mmol/L (ref 22–32)
Calcium: 10.4 mg/dL — ABNORMAL HIGH (ref 8.9–10.3)
Chloride: 101 mmol/L (ref 98–111)
Creatinine, Ser: 0.73 mg/dL (ref 0.44–1.00)
GFR, Estimated: >60 mL/min (ref >60)
Glucose, Bld: 102 mg/dL — ABNORMAL HIGH (ref 70–99)
Potassium: 4.1 mmol/L (ref 3.5–5.1)
Sodium: 140 mmol/L (ref 135–145)

## 2023-04-09 LAB — CBC
HCT: 48.6 % — ABNORMAL HIGH (ref 36.0–46.0)
Hemoglobin: 15.8 g/dL — ABNORMAL HIGH (ref 12.0–15.0)
MCH: 29.1 pg (ref 26.0–34.0)
MCHC: 32.5 g/dL (ref 30.0–36.0)
MCV: 89.5 fL (ref 80.0–100.0)
Platelets: 258 10*3/uL (ref 150–400)
RBC: 5.43 MIL/uL — ABNORMAL HIGH (ref 3.87–5.11)
RDW: 13.4 % (ref 11.5–15.5)
WBC: 8.6 10*3/uL (ref 4.0–10.5)
nRBC: 0 % (ref 0.0–0.2)

## 2023-04-09 LAB — SURGICAL PCR SCREEN
MRSA, PCR: NEGATIVE
Staphylococcus aureus: NEGATIVE

## 2023-04-09 NOTE — Anesthesia Preprocedure Evaluation (Addendum)
Anesthesia Evaluation  Patient identified by MRN, date of birth, ID band Patient awake    Reviewed: Allergy & Precautions, H&P , NPO status , Patient's Chart, lab work & pertinent test results, reviewed documented beta blocker date and time   Airway Mallampati: II  TM Distance: >3 FB Neck ROM: Full    Dental no notable dental hx. (+) Partial Lower, Partial Upper, Poor Dentition, Dental Advisory Given   Pulmonary asthma , COPD,  COPD inhaler, former smoker   Pulmonary exam normal breath sounds clear to auscultation       Cardiovascular hypertension, Pt. on medications and Pt. on home beta blockers + Past MI   Rhythm:Regular Rate:Normal  Cardiac cath 10/19/20:  Normal coronary arteries, left dominant.  Normal LV function.  LVEDP 16 mmHg.  Unless there is high suspicion for microvascular dysfunction, the patient's symptoms are not related to myocardial ischemia.  The abnormal nuclear study from several years ago is a false positive study.    Neuro/Psych  Headaches   Depression       GI/Hepatic Neg liver ROS, hiatal hernia, PUD,GERD  Medicated,,  Endo/Other  Hypothyroidism    Renal/GU negative Renal ROS  negative genitourinary   Musculoskeletal  (+) Arthritis , Osteoarthritis,    Abdominal   Peds  Hematology  (+) Blood dyscrasia, anemia   Anesthesia Other Findings   Reproductive/Obstetrics negative OB ROS                             Anesthesia Physical Anesthesia Plan  ASA: 3  Anesthesia Plan: General   Post-op Pain Management: Tylenol PO (pre-op)*, Minimal or no pain anticipated, Ketamine IV* and Precedex   Induction: Intravenous  PONV Risk Score and Plan: 4 or greater and Ondansetron, Dexamethasone and Midazolam  Airway Management Planned: Oral ETT  Additional Equipment:   Intra-op Plan:   Post-operative Plan: Extubation in OR  Informed Consent: I have reviewed the  patients History and Physical, chart, labs and discussed the procedure including the risks, benefits and alternatives for the proposed anesthesia with the patient or authorized representative who has indicated his/her understanding and acceptance.     Dental advisory given  Plan Discussed with: CRNA  Anesthesia Plan Comments: (See PAT note written 04/09/2023 by Shonna Chock, PA-C.  )       Anesthesia Quick Evaluation

## 2023-04-09 NOTE — Progress Notes (Signed)
PCP - Dr Carylon Perches Cardiologist - Randall An, PA-C   Chest x-ray - n/a EKG - 04/09/23 Stress Test - 07/18/17 ECHO - 07/18/17 Cardiac Cath - 10/19/20  ICD Pacemaker/Loop - n/a  Sleep Study -  n/a CPAP - none  Diabetes - n/a  Aspirin Instructions: Follow your surgeon's instructions on when to stop aspirin prior to surgery,  If no instructions were given by your surgeon then you will need to call the office for those instructions.   NPO  Anesthesia review: Yes  STOP now taking any Aspirin (unless otherwise instructed by your surgeon), Aleve, Naproxen, Ibuprofen, Motrin, Advil, Goody's, BC's, all herbal medications, fish oil, and all vitamins.   Coronavirus Screening Do you have any of the following symptoms:  Cough yes/no: No Fever (>100.50F)  yes/no: No Runny nose yes/no: No Sore throat yes/no: No Difficulty breathing/shortness of breath  yes/no: Yes  Have you traveled in the last 14 days and where? yes/no: No  Patient verbalized understanding of instructions that were given to them at the PAT appointment. Patient was also instructed that they will need to review over the PAT instructions again at home before surgery.

## 2023-04-09 NOTE — Pre-Procedure Instructions (Signed)
Surgical Instructions   Your procedure is scheduled on April 15, 2023. Report to [facility] Main Entrance "A" at 8:20 A.M., then check in with the Admitting office. Any questions or running late day of surgery: call [phone]  Questions prior to your surgery date: call [phone], Monday-Friday, 8am-4pm. If you experience any cold or flu symptoms such as cough, fever, chills, shortness of breath, etc. between now and your scheduled surgery, please notify us at the above number.     Remember:  Do not eat or drink after midnight the night before your surgery    Take these medicines the morning of surgery with A SIP OF WATER: levothyroxine (SYNTHROID)  metoprolol tartrate (LOPRESSOR)  morphine (MS CONTIN)  omeprazole (PRILOSEC)    May take these medicines IF NEEDED: albuterol (PROVENTIL HFA;VENTOLIN HFA) inhaler  cyclobenzaprine (FLEXERIL)  nitroGLYCERIN (NITROSTAT) - please call [phone] if dose taken prior to surgery.   Follow your surgeon's instructions on when to stop Aspirin.  If no instructions were given by your surgeon then you will need to call the office to get those instructions.     One week prior to surgery, STOP taking any Aleve, Naproxen, Ibuprofen, Motrin, Advil, Goody's, BC's, all herbal medications, fish oil, and non-prescription vitamins. This includes your medication: diclofenac sodium (VOLTAREN) GEL                      Do NOT Smoke (Tobacco/Vaping) for 24 hours prior to your procedure.  If you use a CPAP at night, you may bring your mask/headgear for your overnight stay.   You will be asked to remove any contacts, glasses, piercing's, hearing aid's, dentures/partials prior to surgery. Please bring cases for these items if needed.    Patients discharged the day of surgery will not be allowed to drive home, and someone needs to stay with them for 24 hours.  SURGICAL WAITING ROOM VISITATION Patients may have no more than 2 support people in the  waiting area - these visitors may rotate.   Pre-op nurse will coordinate an appropriate time for 1 ADULT support person, who may not rotate, to accompany patient in pre-op.  Children under the age of 55 must have an adult with them who is not the patient and must remain in the main waiting area with an adult.  If the patient needs to stay at the hospital during part of their recovery, the visitor guidelines for inpatient rooms apply.  Please refer to the [facility] website for the visitor guidelines for any additional information.   If you received a COVID test during your pre-op visit  it is requested that you wear a mask when out in public, stay away from anyone that may not be feeling well and notify your surgeon if you develop symptoms. If you have been in contact with anyone that has tested positive in the last 10 days please notify you surgeon.      Pre-operative 5 CHG Bathing Instructions   You can play a key role in reducing the risk of infection after surgery. Your skin needs to be as free of germs as possible. You can reduce the number of germs on your skin by washing with CHG (chlorhexidine gluconate) soap before surgery. CHG is an antiseptic soap that kills germs and continues to kill germs even after washing.   DO NOT use if you have an allergy to chlorhexidine/CHG or antibacterial soaps. If your skin becomes reddened or irritated, stop using the CHG  and notify one of our RNs at [phone].   Please shower with the CHG soap starting 4 days before surgery using the following schedule:     Please keep in mind the following:  DO NOT shave, including legs and underarms, starting the day of your first shower.   You may shave your face at any point before/day of surgery.  Place clean sheets on your bed the day you start using CHG soap. Use a clean washcloth (not used since being washed) for each shower. DO NOT sleep with pets once you start using the CHG.   CHG Shower  Instructions:  If you choose to wash your hair and private area, wash first with your normal shampoo/soap.  After you use shampoo/soap, rinse your hair and body thoroughly to remove shampoo/soap residue.  Turn the water OFF and apply about 3 tablespoons (45 ml) of CHG soap to a CLEAN washcloth.  Apply CHG soap ONLY FROM YOUR NECK DOWN TO YOUR TOES (washing for 3-5 minutes)  DO NOT use CHG soap on face, private areas, open wounds, or sores.  Pay special attention to the area where your surgery is being performed.  If you are having back surgery, having someone wash your back for you may be helpful. Wait 2 minutes after CHG soap is applied, then you may rinse off the CHG soap.  Pat dry with a clean towel  Put on clean clothes/pajamas   If you choose to wear lotion, please use ONLY the CHG-compatible lotions on the back of this paper.   Additional instructions for the day of surgery: DO NOT APPLY any lotions, deodorants, cologne, or perfumes.   Do not bring valuables to the hospital. [facility] is not responsible for any belongings/valuables. Do not wear nail polish, gel polish, artificial nails, or any other type of covering on natural nails (fingers and toes) Do not wear jewelry or makeup Put on clean/comfortable clothes.  Please brush your teeth.  Ask your nurse before applying any prescription medications to the skin.     CHG Compatible Lotions   Aveeno Moisturizing lotion  Cetaphil Moisturizing Cream  Cetaphil Moisturizing Lotion  Clairol Herbal Essence Moisturizing Lotion, Dry Skin  Clairol Herbal Essence Moisturizing Lotion, Extra Dry Skin  Clairol Herbal Essence Moisturizing Lotion, Normal Skin  Curel Age Defying Therapeutic Moisturizing Lotion with Alpha Hydroxy  Curel Extreme Care Body Lotion  Curel Soothing Hands Moisturizing Hand Lotion  Curel Therapeutic Moisturizing Cream, Fragrance-Free  Curel Therapeutic Moisturizing Lotion, Fragrance-Free  Curel Therapeutic  Moisturizing Lotion, Original Formula  Eucerin Daily Replenishing Lotion  Eucerin Dry Skin Therapy Plus Alpha Hydroxy Crme  Eucerin Dry Skin Therapy Plus Alpha Hydroxy Lotion  Eucerin Original Crme  Eucerin Original Lotion  Eucerin Plus Crme Eucerin Plus Lotion  Eucerin TriLipid Replenishing Lotion  Keri Anti-Bacterial Hand Lotion  Keri Deep Conditioning Original Lotion Dry Skin Formula Softly Scented  Keri Deep Conditioning Original Lotion, Fragrance Free Sensitive Skin Formula  Keri Lotion Fast Absorbing Fragrance Free Sensitive Skin Formula  Keri Lotion Fast Absorbing Softly Scented Dry Skin Formula  Keri Original Lotion  Keri Skin Renewal Lotion Keri Silky Smooth Lotion  Keri Silky Smooth Sensitive Skin Lotion  Nivea Body Creamy Conditioning Oil  Nivea Body Extra Enriched Teacher, adult education Moisturizing Lotion Nivea Crme  Nivea Skin Firming Lotion  NutraDerm 30 Skin Lotion  NutraDerm Skin Lotion  NutraDerm Therapeutic Skin Cream  NutraDerm Therapeutic Skin Lotion  ProShield Protective Hand Cream  Provon moisturizing lotion  Please read over the following fact sheets that you were given.

## 2023-04-09 NOTE — Progress Notes (Signed)
Anesthesia APP PAT Evaluation:  Case: [id number] Date/Time: 04/15/23 1008   Procedure: ACDF C5-C6 - C6-C7   Anesthesia type: General   Pre-op diagnosis: Stenosis   [facility]: [facility] ROOM 20 / [facility]   Surgeons: Donalee Citrin, MD       DISCUSSION: Patient is a 72 year old female scheduled for the above procedure.   History includes former smoker (quit 01/05/04), HLD, MI (probable NSTEMI 03/21/04 with anterolateral T wave inversion, normal coronaries, question of apical wall hypokinesis/akinesis hypothyroidism, possibly due to vasospasm, normal LVF follow-up echo), PUD (~ 1998), GERD, hiatal hernia, COPD, asthma, skin cancer, cervical cancer (1984, s/p hysterectomy), exertional dyspnea, anemia, palpitations (PSVT 03/2004 & 03/2005; 8 beats VT 03/2004; PVCs by previous monitor), osteoarthritis (right TKA 06/20/12, left TKA 04/02/14), chronic back pain (opioid dependence), spinal surgery (L2-3 PLIF 09/02/17; L5-S1 ALIF 10/05/19). BMI is consistent with obesity.   She has been followed by [name] over the years for recurrent chest pains, most recently [place], PA-C with Dr. Nona Dell in 2022. She has had 2 cardiac catheterization showing normal coronaries, last on 10/19/20. However, on 03/21/04 it was suspected that she had an NSTEMI after she presented with chest pain and new anterolateral T wave inversions.  She had a LHC then showing normal coronaries, but question of apical wall hypokinesis to akinesis. Medical therapy recommended with follow-up echo which showed normal LV function. Coronary vasospasm was considered. She has also had known ectopy with palpitations including PVCs. She had an admission in 03/2004 with telemetery showing an 8 beat run of VT and some bursts of SVT.  Bursts of SVT also noted during 03/2005 admission. She also reports a history of afib with "shock", although I cannot find details of this in [facility] cardiology notes. She described initial chest pains (years ago) as "pressure",  like a man sitting on her chest. Over the past couple of years, she described her rare chest pain as "pain" in her left chest with radiation down her LUE--although similar symptoms described before March 2022 cath and also during 06/2016 chest pain admission and a stress test was ordered. She never scheduled the stress test in 2017, but ultimately had on 07/18/17 when required for pre-operative risk assessment for lumbar fusion. 07/18/17 stress test showed findings consistent with very mild apical ischemia versus apical thinning, low risk from perfusion standpoint, but high risk due to LVEF 32%, although LVEF actually normal at 55-60% by 07/18/17 echo. She went on to have two lumbar fusions between 2019-2021. She has since had another LHC on 10/19/20 showing normal coronary arteries, false positive 2018 nuclear stress test, unless high suspicion for microvascular dysfunction, her symptoms were not felt to be related to myocardial ischemia. She does have a family history of CAD in her other brothers. Her younger sister, who was a heavy smoker, had a history of MI with redo CABG and was found deceased at home at age 67 within a few days of having to move belongings to a different apartment.   I evaluated her at PAT due to her cardiac history, last visit in March 2022, but had normal coronaries at that time. She says she was not aware of any need for on-going cardiology follow-up. Her PCP Carylon Perches, MD refills her Nitroglycerin as needed. She says she has to take nitroglycerin rarely, about once a year. She says she was not given a specific etiology of her chest pains, but was told it could be related to number of issues  likes tachycardia, stress, anxiety, or indigestion.  She reported use of nitroglycerin last in May 2024 due to marital tension. As mentioned above, she described left chest pain that radiated down her arm. She had no associated diaphoresis, nausea, palpitations, or acute SOB. She took a total of 2  nitroglycerin over a 1-2 hour time span. Pain ultimately went away, and she did not seek immediate evaluation. She says that for the past 3 months she has not been quite as active due to her neck pain, LUE pain and numbness, but continues to walk daily. She says that she can walk for 1-2 hours and is able to go up 1-2 flights of stairs but at a slower pace. She does all of her own house cleaning including vacuuming without CV symptoms. She denied any chest pain related to exertion. She says she is prone to deconditioning and may note some exertional dyspnea if she misses her daily walking for 2-3 days, but otherwise no SOB or significant DOE if she is consistent with her walking. She has known varicose veins and venous insufficiency with previous vascular surgery evaluation in 2020, but says LE edema is stable with elevation and diuretic therapy. She has slept in a recliner for a few years now due to back pain. It is difficult for her to lie flat due to back pain and not due to dyspnea. She does notice occasional palpitations which she says has correlated with her known PVCs, but she denied any persistent palpitations for > 10 years. She denied syncope. Her EKG showed SR with occasional PVCs, LAD, possible inferior and anterior infarct pattern. LAD is new when compared to 12/14/21 tracing, although interpreting cardiologist felt tracing was not significantly changed from prior.    She has had several evaluations over the years for chest pain as outlined above. Normal coronaries in 2005 and 2022. She continues to use as needed nitroglycerin about once a year, last in May 2024. She denied exertional chest pain. She does have known PVCs, occasional to frequent by auscultation (occasional by EKG). She says she can feel PVCs, but is has not had any known sustained symptoms for over 10 years now. She denied syncope. She is on a b-blocker.   Discussed above with anesthesiologist Mal Amabile. No additional  preoperative recommendations at this time given normal coronaries in 2022, stable EKG, and denied of exertional chest pain with reported prolonged physical activity. Anesthesia team to re-evaluate on the day of surgery.    VS: BP (!) 143/94   Pulse (!) 117   Temp 36.8 C   Resp 18   Ht 5\' 5"  (1.651 m)   Wt 92.3 kg   SpO2 98%   BMI 33.86 kg/m HR 74 bpm on EKG.  Pleasant female in NAD. She was intermittently tearful in relating marital issues with her husband of 42 years and the passing of her younger sister within the past two year. Heart with underlying regular rhythm with runs of irregularity, possibly correlating with PVCs which were noted on her EKG. No definite murmur noted. Lungs clear. No carotid bruits noted. Mild-moderate ankle generalized edema with varicosities, left > right ankle, without significant pitting.    PROVIDERS: Carylon Perches, MD is PCP  Randall An, PA-C/[place]owell, Remi Deter, MD is cardiology providers  Early, Tawanna Cooler, MD is vascular surgeon. See on on 12/09/18 for BLE edema. Noninvasive studies were negative for DVT. She did have reflux in her right CFV and GSV in distal thigh, bilateral dilated GSV. He suspected she  had mild reflux on the left although not identified on Duplex. He did not think she would get much benefit from GSV ablation, so recommended continued elevation and compression. Could consider ablation in the future if progressive changes. As needed follow-up recommended.  Celedonio Miyamoto, PA-C is pain specialist [facility])    LABS: Labs reviewed: Acceptable for surgery. (all labs ordered are listed, but only abnormal results are displayed)  Labs Reviewed  CBC - Abnormal; Notable for the following components:      Result Value   RBC 5.43 (*)    Hemoglobin 15.8 (*)    HCT 48.6 (*)    All other components within normal limits  BASIC METABOLIC PANEL - Abnormal; Notable for the following components:   Glucose, Bld 102 (*)    Calcium 10.4  (*)    All other components within normal limits  SURGICAL PCR SCREEN     Sleep Study 11/30/14: IMPRESSION:  1. Hypoventilation syndrome. Two L of nocturnal submental oxygen suggested. 2. Mild obstructive sleep apnea syndrome not requiring positive pressure treatment.   IMAGES: MRI C-spine 11/06/22: IMPRESSION: 1. Moderate spinal canal stenosis at C5-6 and C6-7. 2. Moderate right and severe left neural foraminal stenosis at C5-6 and C6-7. 3. Mild left neural foraminal stenosis at C3-4 and C4-5.   EKG: EKG 04/09/23: Sinus rhythm with occasional Premature ventricular complexes Left axis deviation Inferior infarct , age undetermined No significant change since last tracing Confirmed by Dietrich Pates ([id number]) on 04/09/2023 3:24:22 PM  EKG 12/14/21: Sinus tachycardia with occasional Premature ventricular complexes Incomplete right bundle branch block Possible Right ventricular hypertrophy Nonspecific ST abnormality Abnormal ECG Confirmed by Lennie Odor [phone]) on 12/14/2021 9:16:14 PM   CV: Cardiac cath 10/19/20: Normal coronary arteries, left dominant. Normal LV function.  LVEDP 16 mmHg. Unless there is high suspicion for microvascular dysfunction, the patient's symptoms are not related to myocardial ischemia. The abnormal nuclear study from several years ago is a false positive study.   RECOMMENDATIONS: Further management per primary cardiology team.   Nuclear stress test 07/18/17:  There was no ST segment deviation noted during stress. Findings consistent with very mild apical ischemia verse differences in apical thinning. Either finding alone is low risk from a perfusion standpoint. This is a high risk study. High risk based on decreased LVEF and elevated TID of 1.49. There is no evidence of significant ischemia by perfusion imaging itself, however with decreased LVEF and elevated TID there may be balanced ischemia. Recommend correlating LVEF with echo. The left ventricular  ejection fraction is moderately decreased (30-44%). LVEF is 32%    Echo 07/18/17: Study Conclusions  - Left ventricle: The cavity size was normal. Wall thickness was    increased in a pattern of moderate LVH. Systolic function was    normal. The estimated ejection fraction was in the range of 55%    to 60%. Wall motion was normal; there were no regional wall    motion abnormalities. Doppler parameters are consistent with    abnormal left ventricular relaxation (grade 1 diastolic    dysfunction).  - Aortic valve: There was mild regurgitation. Valve area (VTI):    1.92 cm^2. Valve area (Vmax): 1.91 cm^2. Valve area (Vmean): 1.78    cm^2.  - Technically adequate study.    Past Medical History:  Diagnosis Date   Abnormal findings on imaging of biliary tract 1997-2000   CBD 21mm, Dr Danny Lawless biliary/pancreatic manometry, EUS Dr. Christella Hartigan 09/18/07 dilated but otherwise normal extrahepatic duct,  no masses   Anemia    Arthritis    Asthma    Bursitis    Cervical ca (HCC) 1984   partial hysterectomy   Chronic abdominal pain    Functional, seen @[facility] , extensive WU here benign    Chronic back pain    On methadone   Constipation    COPD (chronic obstructive pulmonary disease) (HCC)    Degenerative lumbar spinal stenosis 10/2022   Depression    w/ menopause   Dyspnea    with exertion   Essential hypertension    GERD (gastroesophageal reflux disease)    H/O hiatal hernia    Headache    Hx of migraines   Hemorrhoids    Hyperlipemia    Hypothyroidism    MI (myocardial infarction) (HCC) 2000   Details not clear   Opioid dependence (HCC)    morphine   Osteoarthritis    Palpitations    PVCs   Pneumonia    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 Teddy Spike sec to tortuous colon, followed by BE & flex sig normal   Skin cancer    Nose   TIA (transient ischemic attack) 1983    Past  Surgical History:  Procedure Laterality Date   ABDOMINAL EXPOSURE N/A 10/05/2019   Procedure: ABDOMINAL EXPOSURE;  Surgeon: Cephus Shelling, MD;  Location: [facility] OR;  Service: Vascular;  Laterality: N/A;   ABDOMINAL HYSTERECTOMY     ANTERIOR LUMBAR FUSION N/A 10/05/2019   Procedure: Anterior Lumbar Interbody Fusion - Lumbar Five-Sacral One;  Surgeon: Donalee Citrin, MD;  Location: [facility] OR;  Service: Neurosurgery;  Laterality: N/A;  Anterior Lumbar Interbody Fusion - Lumbar Five-Sacral One   APPENDECTOMY     arthroscopic knee     BACK SURGERY     BIOPSY  12/18/2021   Procedure: BIOPSY;  Surgeon: Lanelle Bal, DO;  [facility]: [facility];  Service: Endoscopy;;   CARDIAC CATHETERIZATION  2005   CATARACT EXTRACTION W/PHACO Right 09/02/2014   Procedure: CATARACT EXTRACTION PHACO AND INTRAOCULAR LENS PLACEMENT RIGHT EYE;  Surgeon: Gemma Payor, MD;  Location: [facility] ORS;  Service: Ophthalmology;  Laterality: Right;  CDE:8.96   CATARACT EXTRACTION W/PHACO Left 09/13/2014   Procedure: CATARACT EXTRACTION PHACO AND INTRAOCULAR LENS PLACEMENT LEFT EYE;  Surgeon: Gemma Payor, MD;  Location: [facility]S;  Service: Ophthalmology;  Laterality: Left;  CDE 6.20   CHOLECYSTECTOMY     COLONOSCOPY WITH PROPOFOL N/A 12/18/2021   Procedure: COLONOSCOPY WITH PROPOFOL;  Surgeon: Lanelle Bal, DO;  [facility];  Service: Endoscopy;  Laterality: N/A;  12:15pm   ESOPHAGOGASTRODUODENOSCOPY (EGD) WITH PROPOFOL N/A 12/18/2021   Procedure: ESOPHAGOGASTRODUODENOSCOPY (EGD) WITH PROPOFOL;  Surgeon: Lanelle Bal, DO;  [facility]: [facility];  Service: Endoscopy;  Laterality: N/A;   EYE SURGERY Bilateral    cataract removal   INGUINAL HERNIA REPAIR     left   JOINT REPLACEMENT Bilateral    knees   LEFT HEART CATH AND CORONARY ANGIOGRAPHY N/A 10/19/2020   Procedure: LEFT HEART CATH AND CORONARY ANGIOGRAPHY;  Surgeon: Lyn Records, MD;  [facility];  Service: Cardiovascular;  Laterality:  N/A;   PARTIAL HYSTERECTOMY     right arm     nerve   TONSILLECTOMY     TOTAL KNEE ARTHROPLASTY  06/20/2012   Procedure: TOTAL KNEE ARTHROPLASTY;  Surgeon: Thera Flake., MD;  Location: [facility]  OR;  Service: Orthopedics;  Laterality: Right;   TOTAL KNEE ARTHROPLASTY Left 04/02/2014   Procedure: TOTAL KNEE ARTHROPLASTY;  Surgeon: Thera Flake., MD;  Location: [facility] OR;  Service: Orthopedics;  Laterality: Left;    MEDICATIONS:  albuterol (PROVENTIL HFA;VENTOLIN HFA) 108 (90 Base) MCG/ACT inhaler   aspirin EC 81 MG tablet   chlorthalidone (HYGROTON) 25 MG tablet   clobetasol (TEMOVATE) 0.05 % external solution   cyclobenzaprine (FLEXERIL) 10 MG tablet   desonide (DESOWEN) 0.05 % cream   diclofenac sodium (VOLTAREN) 1 % GEL   levothyroxine (SYNTHROID) 137 MCG tablet   losartan (COZAAR) 100 MG tablet   lubiprostone (AMITIZA) 24 MCG capsule   metoprolol tartrate (LOPRESSOR) 50 MG tablet   morphine (MS CONTIN) 30 MG 12 hr tablet   morphine (MSIR) 15 MG tablet   nitroGLYCERIN (NITROSTAT) 0.4 MG SL tablet   omeprazole (PRILOSEC) 40 MG capsule   simvastatin (ZOCOR) 20 MG tablet   No current facility-administered medications for this encounter.   She reported surgeon gave her instructions on when to hold ASA.    Shonna Chock, PA-C Surgical Short Stay/Anesthesiology [facility] [place] Phone [phone] [facility] [place] Phone [phone] 04/09/2023 4:49 PM

## 2023-04-15 ENCOUNTER — Ambulatory Visit (HOSPITAL_COMMUNITY)
Admission: RE | Admit: 2023-04-15 | Discharge: 2023-04-16 | Disposition: A | Discharge status: Home / Self Care | Payer: BC Managed Care – PPO | Attending: Neurosurgery | Admitting: Neurosurgery

## 2023-04-15 ENCOUNTER — Other Ambulatory Visit: Payer: Self-pay

## 2023-04-15 ENCOUNTER — Encounter (HOSPITAL_COMMUNITY): Payer: Self-pay | Admitting: Neurosurgery

## 2023-04-15 ENCOUNTER — Ambulatory Visit (HOSPITAL_COMMUNITY): Payer: BC Managed Care – PPO

## 2023-04-15 ENCOUNTER — Encounter (HOSPITAL_COMMUNITY)
Admission: RE | Disposition: A | Discharge status: Home / Self Care | Payer: Self-pay | Source: Home / Self Care | Attending: Neurosurgery

## 2023-04-15 ENCOUNTER — Ambulatory Visit (HOSPITAL_COMMUNITY): Payer: Self-pay

## 2023-04-15 ENCOUNTER — Ambulatory Visit (HOSPITAL_COMMUNITY): Payer: BC Managed Care – PPO | Admitting: Vascular Surgery

## 2023-04-15 DIAGNOSIS — J4489 Other specified chronic obstructive pulmonary disease: Secondary | ICD-10-CM | POA: Diagnosis not present

## 2023-04-15 DIAGNOSIS — K449 Diaphragmatic hernia without obstruction or gangrene: Secondary | ICD-10-CM | POA: Insufficient documentation

## 2023-04-15 DIAGNOSIS — I252 Old myocardial infarction: Secondary | ICD-10-CM | POA: Diagnosis not present

## 2023-04-15 DIAGNOSIS — M2578 Osteophyte, vertebrae: Secondary | ICD-10-CM | POA: Insufficient documentation

## 2023-04-15 DIAGNOSIS — E039 Hypothyroidism, unspecified: Secondary | ICD-10-CM | POA: Diagnosis not present

## 2023-04-15 DIAGNOSIS — I10 Essential (primary) hypertension: Secondary | ICD-10-CM | POA: Diagnosis not present

## 2023-04-15 DIAGNOSIS — M4722 Other spondylosis with radiculopathy, cervical region: Secondary | ICD-10-CM | POA: Insufficient documentation

## 2023-04-15 DIAGNOSIS — M4802 Spinal stenosis, cervical region: Secondary | ICD-10-CM | POA: Diagnosis present

## 2023-04-15 DIAGNOSIS — K219 Gastro-esophageal reflux disease without esophagitis: Secondary | ICD-10-CM | POA: Insufficient documentation

## 2023-04-15 DIAGNOSIS — Z87891 Personal history of nicotine dependence: Secondary | ICD-10-CM | POA: Insufficient documentation

## 2023-04-15 HISTORY — PX: ANTERIOR CERVICAL DECOMP/DISCECTOMY FUSION: SHX1161

## 2023-04-15 SURGERY — ANTERIOR CERVICAL DECOMPRESSION/DISCECTOMY FUSION 2 LEVELS
Anesthesia: General

## 2023-04-15 MED ORDER — CHLORTHALIDONE 25 MG PO TABS
25.0000 mg | ORAL_TABLET | Freq: Every day | ORAL | Status: DC
  Administered 2023-04-15: 25 mg via ORAL
  Filled 2023-04-15: qty 1

## 2023-04-15 MED ORDER — HAIR SKIN & NAILS PO TABS: ORAL_TABLET | Freq: Every day | ORAL | Status: DC

## 2023-04-15 MED ORDER — CEFAZOLIN SODIUM-DEXTROSE 2-4 GM/100ML-% IV SOLN
2.0000 g | Freq: Three times a day (TID) | INTRAVENOUS | Status: AC
  Administered 2023-04-15 – 2023-04-16 (×2): 2 g via INTRAVENOUS
  Filled 2023-04-15 (×2): qty 100

## 2023-04-15 MED ORDER — LEVOTHYROXINE SODIUM 137 MCG PO TABS
137.0000 ug | ORAL_TABLET | Freq: Every day | ORAL | Status: DC
  Administered 2023-04-16: 137 ug via ORAL
  Filled 2023-04-15: qty 1

## 2023-04-15 MED ORDER — CLOBETASOL PROPIONATE 0.05 % EX SOLN: 1.0000 | Freq: Two times a day (BID) | CUTANEOUS | Status: DC

## 2023-04-15 MED ORDER — METOPROLOL TARTRATE 50 MG PO TABS
50.0000 mg | ORAL_TABLET | Freq: Two times a day (BID) | ORAL | Status: DC
  Administered 2023-04-15: 50 mg via ORAL
  Filled 2023-04-15: qty 1

## 2023-04-15 MED ORDER — CHLORHEXIDINE GLUCONATE CLOTH 2 % EX PADS: 6.0000 | MEDICATED_PAD | Freq: Once | CUTANEOUS | Status: DC

## 2023-04-15 MED ORDER — CHLORHEXIDINE GLUCONATE 0.12 % MT SOLN: 15.0000 mL | Freq: Once | OROMUCOSAL | Status: AC

## 2023-04-15 MED ORDER — ZOLPIDEM TARTRATE 5 MG PO TABS
5.0000 mg | ORAL_TABLET | Freq: Every day | ORAL | Status: DC
  Administered 2023-04-15: 5 mg via ORAL
  Filled 2023-04-15: qty 1

## 2023-04-15 MED ORDER — SODIUM CHLORIDE 0.9% FLUSH
3.0000 mL | Freq: Two times a day (BID) | INTRAVENOUS | Status: DC
  Administered 2023-04-15: 3 mL via INTRAVENOUS

## 2023-04-15 MED ORDER — VANCOMYCIN HCL IN DEXTROSE 1-5 GM/200ML-% IV SOLN
INTRAVENOUS | Status: AC
  Administered 2023-04-15: 1000 mg via INTRAVENOUS
  Filled 2023-04-15: qty 200

## 2023-04-15 MED ORDER — 0.9 % SODIUM CHLORIDE (POUR BTL) OPTIME
TOPICAL | Status: DC | PRN
  Administered 2023-04-15: 1000 mL

## 2023-04-15 MED ORDER — VANCOMYCIN HCL IN DEXTROSE 1-5 GM/200ML-% IV SOLN: 1000.0000 mg | INTRAVENOUS | Status: AC

## 2023-04-15 MED ORDER — ROCURONIUM BROMIDE 10 MG/ML (PF) SYRINGE
PREFILLED_SYRINGE | INTRAVENOUS | Status: DC | PRN
  Administered 2023-04-15: 30 mg via INTRAVENOUS
  Administered 2023-04-15: 50 mg via INTRAVENOUS
  Administered 2023-04-15: 20 mg via INTRAVENOUS

## 2023-04-15 MED ORDER — ONDANSETRON HCL 4 MG PO TABS: 4.0000 mg | ORAL_TABLET | Freq: Four times a day (QID) | ORAL | Status: DC | PRN

## 2023-04-15 MED ORDER — CEFAZOLIN SODIUM-DEXTROSE 1-4 GM/50ML-% IV SOLN
INTRAVENOUS | Status: DC | PRN
  Administered 2023-04-15: 2 g via INTRAVENOUS

## 2023-04-15 MED ORDER — ACETAMINOPHEN 500 MG PO TABS
ORAL_TABLET | ORAL | Status: AC
  Filled 2023-04-15: qty 2

## 2023-04-15 MED ORDER — ALUM & MAG HYDROXIDE-SIMETH 200-200-20 MG/5ML PO SUSP: 30.0000 mL | Freq: Four times a day (QID) | ORAL | Status: DC | PRN

## 2023-04-15 MED ORDER — MIDAZOLAM HCL 2 MG/2ML IJ SOLN
INTRAMUSCULAR | Status: AC
  Filled 2023-04-15: qty 2

## 2023-04-15 MED ORDER — KETAMINE HCL 50 MG/5ML IJ SOSY
PREFILLED_SYRINGE | INTRAMUSCULAR | Status: AC
  Filled 2023-04-15: qty 5

## 2023-04-15 MED ORDER — PANTOPRAZOLE SODIUM 40 MG IV SOLR: 40.0000 mg | Freq: Every day | INTRAVENOUS | Status: DC

## 2023-04-15 MED ORDER — SIMVASTATIN 20 MG PO TABS
20.0000 mg | ORAL_TABLET | Freq: Every day | ORAL | Status: DC
  Administered 2023-04-15: 20 mg via ORAL
  Filled 2023-04-15: qty 1

## 2023-04-15 MED ORDER — MIDAZOLAM HCL 2 MG/2ML IJ SOLN
INTRAMUSCULAR | Status: DC | PRN
  Administered 2023-04-15: 2 mg via INTRAVENOUS

## 2023-04-15 MED ORDER — LACTATED RINGERS IV SOLN: INTRAVENOUS | Status: DC

## 2023-04-15 MED ORDER — SODIUM CHLORIDE 0.9 % IV SOLN
250.0000 mL | INTRAVENOUS | Status: DC
  Administered 2023-04-15: 250 mL via INTRAVENOUS

## 2023-04-15 MED ORDER — DEXMEDETOMIDINE HCL IN NACL 80 MCG/20ML IV SOLN
INTRAVENOUS | Status: DC | PRN
Start: 2023-04-15 — End: 2023-04-15
  Administered 2023-04-15 (×2): 8 ug via INTRAVENOUS
  Administered 2023-04-15: 4 ug via INTRAVENOUS

## 2023-04-15 MED ORDER — FENTANYL CITRATE (PF) 250 MCG/5ML IJ SOLN
INTRAMUSCULAR | Status: DC | PRN
  Administered 2023-04-15: 100 ug via INTRAVENOUS
  Administered 2023-04-15: 50 ug via INTRAVENOUS
  Administered 2023-04-15: 100 ug via INTRAVENOUS

## 2023-04-15 MED ORDER — ROCURONIUM BROMIDE 10 MG/ML (PF) SYRINGE
PREFILLED_SYRINGE | INTRAVENOUS | Status: AC
  Filled 2023-04-15: qty 10

## 2023-04-15 MED ORDER — ONDANSETRON HCL 4 MG/2ML IJ SOLN
INTRAMUSCULAR | Status: AC
  Filled 2023-04-15: qty 2

## 2023-04-15 MED ORDER — PHENOL 1.4 % MT LIQD
1.0000 | OROMUCOSAL | Status: DC | PRN
  Administered 2023-04-15: 1 via OROMUCOSAL
  Filled 2023-04-15: qty 177

## 2023-04-15 MED ORDER — LIDOCAINE 2% (20 MG/ML) 5 ML SYRINGE
INTRAMUSCULAR | Status: DC | PRN
  Administered 2023-04-15: 60 mg via INTRAVENOUS

## 2023-04-15 MED ORDER — DOXYCYCLINE HYCLATE 100 MG PO TABS
100.0000 mg | ORAL_TABLET | Freq: Two times a day (BID) | ORAL | Status: DC
  Administered 2023-04-16: 100 mg via ORAL
  Filled 2023-04-15 (×4): qty 1

## 2023-04-15 MED ORDER — POTASSIUM CHLORIDE CRYS ER 10 MEQ PO TBCR
10.0000 meq | EXTENDED_RELEASE_TABLET | Freq: Two times a day (BID) | ORAL | Status: DC
  Administered 2023-04-15 (×2): 10 meq via ORAL
  Filled 2023-04-15 (×2): qty 1

## 2023-04-15 MED ORDER — MENTHOL 3 MG MT LOZG: 1.0000 | LOZENGE | OROMUCOSAL | Status: DC | PRN

## 2023-04-15 MED ORDER — ACETAMINOPHEN 325 MG PO TABS
650.0000 mg | ORAL_TABLET | ORAL | Status: DC | PRN
  Administered 2023-04-15: 650 mg via ORAL
  Filled 2023-04-15: qty 2

## 2023-04-15 MED ORDER — ASPIRIN 81 MG PO TBEC
81.0000 mg | DELAYED_RELEASE_TABLET | Freq: Every day | ORAL | Status: DC
  Administered 2023-04-15: 81 mg via ORAL
  Filled 2023-04-15: qty 1

## 2023-04-15 MED ORDER — LUBIPROSTONE 24 MCG PO CAPS
24.0000 ug | ORAL_CAPSULE | Freq: Two times a day (BID) | ORAL | Status: DC
  Administered 2023-04-16: 24 ug via ORAL
  Filled 2023-04-15 (×2): qty 1

## 2023-04-15 MED ORDER — THROMBIN 5000 UNITS EX SOLR
OROMUCOSAL | Status: DC | PRN
  Administered 2023-04-15: 5 mL via TOPICAL

## 2023-04-15 MED ORDER — PHENYLEPHRINE HCL-NACL 20-0.9 MG/250ML-% IV SOLN
INTRAVENOUS | Status: DC | PRN
  Administered 2023-04-15: 50 ug/min via INTRAVENOUS

## 2023-04-15 MED ORDER — SODIUM CHLORIDE 0.9% FLUSH: 3.0000 mL | INTRAVENOUS | Status: DC | PRN

## 2023-04-15 MED ORDER — KETAMINE HCL 10 MG/ML IJ SOLN
INTRAMUSCULAR | Status: DC | PRN
Start: 2023-04-15 — End: 2023-04-15
  Administered 2023-04-15: 50 mg via INTRAVENOUS

## 2023-04-15 MED ORDER — PROPOFOL 10 MG/ML IV BOLUS
INTRAVENOUS | Status: DC | PRN
  Administered 2023-04-15: 50 mg via INTRAVENOUS
  Administered 2023-04-15: 100 mg via INTRAVENOUS

## 2023-04-15 MED ORDER — ALBUTEROL SULFATE HFA 108 (90 BASE) MCG/ACT IN AERS: 2.0000 | INHALATION_SPRAY | RESPIRATORY_TRACT | Status: DC | PRN

## 2023-04-15 MED ORDER — SUGAMMADEX SODIUM 200 MG/2ML IV SOLN
INTRAVENOUS | Status: DC | PRN
  Administered 2023-04-15: 200 mg via INTRAVENOUS

## 2023-04-15 MED ORDER — LOSARTAN POTASSIUM 50 MG PO TABS
100.0000 mg | ORAL_TABLET | Freq: Every day | ORAL | Status: DC
  Administered 2023-04-15: 100 mg via ORAL
  Filled 2023-04-15: qty 2

## 2023-04-15 MED ORDER — ACETAMINOPHEN 500 MG PO TABS
1000.0000 mg | ORAL_TABLET | Freq: Once | ORAL | Status: AC
  Administered 2023-04-15: 1000 mg via ORAL

## 2023-04-15 MED ORDER — FENTANYL CITRATE (PF) 250 MCG/5ML IJ SOLN
INTRAMUSCULAR | Status: AC
  Filled 2023-04-15: qty 5

## 2023-04-15 MED ORDER — DEXAMETHASONE SODIUM PHOSPHATE 10 MG/ML IJ SOLN
INTRAMUSCULAR | Status: AC
  Filled 2023-04-15: qty 1

## 2023-04-15 MED ORDER — THROMBIN 5000 UNITS EX SOLR
CUTANEOUS | Status: AC
  Filled 2023-04-15: qty 15000

## 2023-04-15 MED ORDER — NITROGLYCERIN 0.4 MG SL SUBL: 0.4000 mg | SUBLINGUAL_TABLET | SUBLINGUAL | Status: DC | PRN

## 2023-04-15 MED ORDER — HYDROMORPHONE HCL 1 MG/ML IJ SOLN
0.5000 mg | INTRAMUSCULAR | Status: DC | PRN
  Administered 2023-04-15: 0.5 mg via INTRAVENOUS
  Filled 2023-04-15: qty 0.5

## 2023-04-15 MED ORDER — THROMBIN (RECOMBINANT) 5000 UNITS EX SOLR
CUTANEOUS | Status: DC | PRN
  Administered 2023-04-15: 10 mL via TOPICAL

## 2023-04-15 MED ORDER — DEXAMETHASONE SODIUM PHOSPHATE 10 MG/ML IJ SOLN
INTRAMUSCULAR | Status: DC | PRN
  Administered 2023-04-15: 10 mg via INTRAVENOUS

## 2023-04-15 MED ORDER — LIDOCAINE 2% (20 MG/ML) 5 ML SYRINGE
INTRAMUSCULAR | Status: AC
  Filled 2023-04-15: qty 5

## 2023-04-15 MED ORDER — ONDANSETRON HCL 4 MG/2ML IJ SOLN: 4.0000 mg | Freq: Four times a day (QID) | INTRAMUSCULAR | Status: DC | PRN

## 2023-04-15 MED ORDER — CYCLOBENZAPRINE HCL 10 MG PO TABS
10.0000 mg | ORAL_TABLET | Freq: Three times a day (TID) | ORAL | Status: DC | PRN
  Administered 2023-04-15 – 2023-04-16 (×3): 10 mg via ORAL
  Filled 2023-04-15 (×3): qty 1

## 2023-04-15 MED ORDER — CHLORHEXIDINE GLUCONATE 0.12 % MT SOLN
OROMUCOSAL | Status: AC
  Administered 2023-04-15: 15 mL via OROMUCOSAL
  Filled 2023-04-15: qty 15

## 2023-04-15 MED ORDER — MORPHINE SULFATE ER 30 MG PO TBCR
30.0000 mg | EXTENDED_RELEASE_TABLET | Freq: Two times a day (BID) | ORAL | Status: DC
  Administered 2023-04-15: 30 mg via ORAL
  Filled 2023-04-15: qty 1

## 2023-04-15 MED ORDER — PHENYLEPHRINE 80 MCG/ML (10ML) SYRINGE FOR IV PUSH (FOR BLOOD PRESSURE SUPPORT)
PREFILLED_SYRINGE | INTRAVENOUS | Status: DC | PRN
  Administered 2023-04-15 (×5): 160 ug via INTRAVENOUS

## 2023-04-15 MED ORDER — MORPHINE SULFATE 15 MG PO TABS
15.0000 mg | ORAL_TABLET | Freq: Three times a day (TID) | ORAL | Status: DC | PRN
  Administered 2023-04-15 – 2023-04-16 (×3): 15 mg via ORAL
  Filled 2023-04-15 (×3): qty 1

## 2023-04-15 MED ORDER — DEXMEDETOMIDINE HCL IN NACL 80 MCG/20ML IV SOLN
INTRAVENOUS | Status: AC
  Filled 2023-04-15: qty 20

## 2023-04-15 MED ORDER — PHENYLEPHRINE 80 MCG/ML (10ML) SYRINGE FOR IV PUSH (FOR BLOOD PRESSURE SUPPORT)
PREFILLED_SYRINGE | INTRAVENOUS | Status: AC
  Filled 2023-04-15: qty 10

## 2023-04-15 MED ORDER — FENTANYL CITRATE (PF) 100 MCG/2ML IJ SOLN
INTRAMUSCULAR | Status: AC
  Filled 2023-04-15: qty 2

## 2023-04-15 MED ORDER — FENTANYL CITRATE (PF) 100 MCG/2ML IJ SOLN
25.0000 ug | INTRAMUSCULAR | Status: DC | PRN
  Administered 2023-04-15 (×3): 50 ug via INTRAVENOUS

## 2023-04-15 MED ORDER — CEFAZOLIN SODIUM 1 G IJ SOLR
INTRAMUSCULAR | Status: AC
  Filled 2023-04-15: qty 20

## 2023-04-15 MED ORDER — ACETAMINOPHEN 650 MG RE SUPP: 650.0000 mg | RECTAL | Status: DC | PRN

## 2023-04-15 MED ORDER — ONDANSETRON HCL 4 MG/2ML IJ SOLN
INTRAMUSCULAR | Status: DC | PRN
  Administered 2023-04-15: 4 mg via INTRAVENOUS

## 2023-04-15 MED ORDER — CYCLOBENZAPRINE HCL 10 MG PO TABS: 10.0000 mg | ORAL_TABLET | Freq: Three times a day (TID) | ORAL | Status: DC | PRN

## 2023-04-15 MED ORDER — PROPOFOL 10 MG/ML IV BOLUS
INTRAVENOUS | Status: AC
  Filled 2023-04-15: qty 20

## 2023-04-15 MED ORDER — ORAL CARE MOUTH RINSE: 15.0000 mL | Freq: Once | OROMUCOSAL | Status: AC

## 2023-04-15 MED ORDER — PANTOPRAZOLE SODIUM 40 MG PO TBEC: 40.0000 mg | DELAYED_RELEASE_TABLET | Freq: Every day | ORAL | Status: DC

## 2023-04-15 SURGICAL SUPPLY — 57 items
ADH SKN CLS APL DERMABOND .7 (GAUZE/BANDAGES/DRESSINGS)
APL SKNCLS STERI-STRIP NONHPOA (GAUZE/BANDAGES/DRESSINGS) ×1
BAG COUNTER SPONGE SURGICOUNT (BAG) ×1 IMPLANT
BAG SPNG CNTER NS LX DISP (BAG) ×1
BASKET BONE COLLECTION (BASKET) ×1 IMPLANT
BENZOIN TINCTURE PRP APPL 2/3 (GAUZE/BANDAGES/DRESSINGS) ×1 IMPLANT
BIT DRILL NEURO 2X3.1 SFT TUCH (MISCELLANEOUS) ×1 IMPLANT
BONE VIVIGEN FORMABLE 1.3CC (Bone Implant) ×1 IMPLANT
BUR MATCHSTICK NEURO 3.0 LAGG (BURR) ×1 IMPLANT
CANISTER SUCT 3000ML PPV (MISCELLANEOUS) ×1 IMPLANT
DERMABOND ADVANCED .7 DNX12 (GAUZE/BANDAGES/DRESSINGS) IMPLANT
DRAPE C-ARM 42X72 X-RAY (DRAPES) ×2 IMPLANT
DRAPE LAPAROTOMY 100X72 PEDS (DRAPES) ×1 IMPLANT
DRAPE MICROSCOPE SLANT 54X150 (MISCELLANEOUS) ×1 IMPLANT
DRILL NEURO 2X3.1 SOFT TOUCH (MISCELLANEOUS) ×1
DRSG OPSITE POSTOP 4X6 (GAUZE/BANDAGES/DRESSINGS) IMPLANT
DURAPREP 6ML APPLICATOR 50/CS (WOUND CARE) ×1 IMPLANT
ELECT COATED BLADE 2.86 ST (ELECTRODE) ×1 IMPLANT
ELECT REM PT RETURN 9FT ADLT (ELECTROSURGICAL) ×1
ELECTRODE REM PT RTRN 9FT ADLT (ELECTROSURGICAL) ×1 IMPLANT
GAUZE 4X4 16PLY ~~LOC~~+RFID DBL (SPONGE) IMPLANT
GAUZE SPONGE 4X4 12PLY STRL (GAUZE/BANDAGES/DRESSINGS) ×1 IMPLANT
GLOVE BIO SURGEON STRL SZ7 (GLOVE) IMPLANT
GLOVE BIO SURGEON STRL SZ8 (GLOVE) ×1 IMPLANT
GLOVE BIOGEL PI IND STRL 7.0 (GLOVE) IMPLANT
GLOVE EXAM NITRILE XL STR (GLOVE) IMPLANT
GLOVE INDICATOR 8.5 STRL (GLOVE) ×2 IMPLANT
GLOVE SURG SS PI 7.0 STRL IVOR (GLOVE) IMPLANT
GOWN STRL REUS W/ TWL LRG LVL3 (GOWN DISPOSABLE) IMPLANT
GOWN STRL REUS W/ TWL XL LVL3 (GOWN DISPOSABLE) ×1 IMPLANT
GOWN STRL REUS W/TWL 2XL LVL3 (GOWN DISPOSABLE) IMPLANT
GOWN STRL REUS W/TWL LRG LVL3 (GOWN DISPOSABLE) ×1
GOWN STRL REUS W/TWL XL LVL3 (GOWN DISPOSABLE) ×1
GRAFT BNE MATRIX VG FRMBL SM 1 (Bone Implant) IMPLANT
HALTER HD/CHIN CERV TRACTION D (MISCELLANEOUS) ×1 IMPLANT
HEMOSTAT POWDER KIT SURGIFOAM (HEMOSTASIS) ×1 IMPLANT
KIT BASIN OR (CUSTOM PROCEDURE TRAY) ×1 IMPLANT
KIT TURNOVER KIT B (KITS) ×1 IMPLANT
NDL SPNL 20GX3.5 QUINCKE YW (NEEDLE) ×1 IMPLANT
NEEDLE SPNL 20GX3.5 QUINCKE YW (NEEDLE) ×1 IMPLANT
NS IRRIG 1000ML POUR BTL (IV SOLUTION) ×1 IMPLANT
PACK LAMINECTOMY NEURO (CUSTOM PROCEDURE TRAY) ×1 IMPLANT
PAD ARMBOARD 7.5X6 YLW CONV (MISCELLANEOUS) ×3 IMPLANT
PIN DISTRACTION 14MM (PIN) IMPLANT
PLATE CERV RES 30 2L (Plate) IMPLANT
SCREW VA SD 4.2X12 (Screw) IMPLANT
SCREW VA SD RESONATE 4.2X14 (Screw) IMPLANT
SPACER HEDRON 14X16X8 0D (Spacer) IMPLANT
SPONGE INTESTINAL PEANUT (DISPOSABLE) ×1 IMPLANT
SPONGE SURGIFOAM ABS GEL SZ50 (HEMOSTASIS) ×1 IMPLANT
STRIP CLOSURE SKIN 1/2X4 (GAUZE/BANDAGES/DRESSINGS) ×1 IMPLANT
SUT VIC AB 3-0 SH 8-18 (SUTURE) ×1 IMPLANT
SUT VICRYL 4-0 PS2 18IN ABS (SUTURE) ×1 IMPLANT
TAPE CLOTH 4X10 WHT NS (GAUZE/BANDAGES/DRESSINGS) ×1 IMPLANT
TOWEL GREEN STERILE (TOWEL DISPOSABLE) ×1 IMPLANT
TOWEL GREEN STERILE FF (TOWEL DISPOSABLE) ×1 IMPLANT
WATER STERILE IRR 1000ML POUR (IV SOLUTION) ×1 IMPLANT

## 2023-04-15 NOTE — H&P (Signed)
Selena [name] is an 72 y.o. female.   Chief Complaint: Neck pain bilateral shoulder and arm pain HPI: 72 year old female longstanding issues with her neck and her back presents with neck pain arm pain radiating down C6-C7 nerve root pattern.  Workup revealed multilevel spondylosis but severe at C5-6 and C6-7 and that is where she is symptomatic from.  Due to her progression of clinical syndrome imaging findings of a conservative treatment I recommended anterior cervical discectomies and fusion at those 2 levels.  We have extensively reviewed the risks and benefits of the operation with her as well as perioperative course expectations of outcome and alternatives of surgery and she understands and agrees to proceed forward.  Patient has had a sore throat that has not been febrile for the last 48 to 72 hours and is no longer symptomatic and she has been on antibiotics so I think she is fine for surgery.  Past Medical History:  Diagnosis Date   Abnormal findings on imaging of biliary tract 1997-2000   CBD 21mm, Dr Danny Lawless biliary/pancreatic manometry, EUS Dr. Christella Hartigan 09/18/07 dilated but otherwise normal extrahepatic duct, no masses   Anemia    Arthritis    Asthma    Bursitis    Cervical ca [facility]) 1984   partial hysterectomy   Chronic abdominal pain    Functional, seen @[facility] , extensive WU here benign    Chronic back pain    On methadone   Constipation    COPD (chronic obstructive pulmonary disease) (HCC)    Degenerative lumbar spinal stenosis 10/2022   Depression    w/ menopause   Dyspnea    with exertion   Essential hypertension    GERD (gastroesophageal reflux disease)    H/O hiatal hernia    Headache    Hx of migraines   Hemorrhoids    Hyperlipemia    Hypothyroidism    MI (myocardial infarction) (HCC) 2000   Details not clear   Opioid dependence (HCC)    morphine   Osteoarthritis    Palpitations    PVCs   Pneumonia    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 Teddy Spike sec to tortuous colon, followed by BE & flex sig normal   Skin cancer    Nose   TIA (transient ischemic attack) 1983    Past Surgical History:  Procedure Laterality Date   ABDOMINAL EXPOSURE N/A 10/05/2019   Procedure: ABDOMINAL EXPOSURE;  Surgeon: Cephus Shelling, MD;  Location: [facility] OR;  Service: Vascular;  Laterality: N/A;   ABDOMINAL HYSTERECTOMY     ANTERIOR LUMBAR FUSION N/A 10/05/2019   Procedure: Anterior Lumbar Interbody Fusion - Lumbar Five-Sacral One;  Surgeon: Donalee Citrin, MD;  Location: [facility] OR;  Service: Neurosurgery;  Laterality: N/A;  Anterior Lumbar Interbody Fusion - Lumbar Five-Sacral One   APPENDECTOMY     arthroscopic knee     BACK SURGERY     BIOPSY  12/18/2021   Procedure: BIOPSY;  Surgeon: Lanelle Bal, DO;  Location: [facility];  Service: Endoscopy;;   CARDIAC CATHETERIZATION  2005   CATARACT EXTRACTION W/PHACO Right 09/02/2014   Procedure: CATARACT EXTRACTION PHACO AND INTRAOCULAR LENS PLACEMENT RIGHT EYE;  Surgeon: Gemma Payor, MD;  Location: [facility] ORS;  Service: Ophthalmology;  Laterality: Right;  CDE:8.96   CATARACT EXTRACTION W/PHACO Left 09/13/2014   Procedure: CATARACT EXTRACTION PHACO AND INTRAOCULAR LENS PLACEMENT LEFT EYE;  Surgeon: Gemma Payor,  MD;  Location: [facility] ORS;  Service: Ophthalmology;  Laterality: Left;  CDE 6.20   CHOLECYSTECTOMY     COLONOSCOPY WITH PROPOFOL N/A 12/18/2021   Procedure: COLONOSCOPY WITH PROPOFOL;  Surgeon: Lanelle Bal, DO;  Location: [facility];  Service: Endoscopy;  Laterality: N/A;  12:15pm   ESOPHAGOGASTRODUODENOSCOPY (EGD) WITH PROPOFOL N/A 12/18/2021   Procedure: ESOPHAGOGASTRODUODENOSCOPY (EGD) WITH PROPOFOL;  Surgeon: Lanelle Bal, DO;  Location: [facility];  Service: Endoscopy;  Laterality: N/A;   EYE SURGERY Bilateral    cataract removal   INGUINAL HERNIA REPAIR     left   JOINT REPLACEMENT Bilateral     knees   LEFT HEART CATH AND CORONARY ANGIOGRAPHY N/A 10/19/2020   Procedure: LEFT HEART CATH AND CORONARY ANGIOGRAPHY;  Surgeon: Lyn Records, MD;  Location: [facility] INVASIVE CV LAB;  Service: Cardiovascular;  Laterality: N/A;   PARTIAL HYSTERECTOMY     right arm     nerve   TONSILLECTOMY     TOTAL KNEE ARTHROPLASTY  06/20/2012   Procedure: TOTAL KNEE ARTHROPLASTY;  Surgeon: Thera Flake., MD;  Location: [facility] OR;  Service: Orthopedics;  Laterality: Right;   TOTAL KNEE ARTHROPLASTY Left 04/02/2014   Procedure: TOTAL KNEE ARTHROPLASTY;  Surgeon: Thera Flake., MD;  Location: [facility] OR;  Service: Orthopedics;  Laterality: Left;    Family History  Problem Relation Age of Onset   Ulcers Brother    Heart attack Brother    Ulcers Sister    Thyroid disease Daughter    Hypertension Daughter    Other Daughter        nervous   Hypertension Sister    Heart disease Sister    Thyroid disease Sister    Hypertension Sister    Heart disease Sister    Thyroid disease Sister    Heart attack Brother    Cancer Brother        prostate   Social History:  reports that she quit smoking about 19 years ago. Her smoking use included cigarettes. She started smoking about 41 years ago. She has a 22.4 pack-year smoking history. She has never used smokeless tobacco. She reports that she does not drink alcohol and does not use drugs.  Allergies:  Allergies  Allergen Reactions   Iohexol Shortness Of Breath and Swelling     Desc: Pt states she had Cardia Cath around 2000/ experienced SOB and swelling of upper body during cath procedure/ tsf    Nubain [Nalbuphine Hcl] Shortness Of Breath and Other (See Comments)    ??  DOSE RELATED  ??   Penicillins Itching, Rash and Other (See Comments)   Iodinated Contrast Media Swelling   Latex Rash    Medications Prior to Admission  Medication Sig Dispense Refill   albuterol (PROVENTIL HFA;VENTOLIN HFA) 108 (90 Base) MCG/ACT inhaler Inhale 2 puffs into the lungs every 4  (four) hours as needed for wheezing or shortness of breath. (Patient taking differently: Inhale 2 puffs into the lungs as needed for wheezing or shortness of breath.) 1 Inhaler 0   aspirin EC 81 MG tablet Take 1 tablet (81 mg total) by mouth daily. 90 tablet 3   betamethasone dipropionate 0.05 % cream Apply 1 Application topically as needed (inflimation in legs).     chlorthalidone (HYGROTON) 25 MG tablet Take 25 mg by mouth daily.     clobetasol (TEMOVATE) 0.05 % external solution Apply 1 application  topically 2 (two) times daily. After washing hair  3  cyclobenzaprine (FLEXERIL) 10 MG tablet Take 1 tablet (10 mg total) by mouth 3 (three) times daily as needed for muscle spasms. 30 tablet 0   desonide (DESOWEN) 0.05 % cream Apply 1 application  topically as needed (for psoriasis on skin).  3   diclofenac sodium (VOLTAREN) 1 % GEL Apply 1 Application topically as needed (for knee pain/arthritis).     doxycycline (VIBRA-TABS) 100 MG tablet Take 100 mg by mouth 2 (two) times daily.     ketoconazole (NIZORAL) 2 % cream Apply 1 Application topically as needed (rash).     KLOR-CON M10 10 MEQ tablet Take 10 mEq by mouth 2 (two) times daily.     levothyroxine (SYNTHROID) 137 MCG tablet Take 137 mcg by mouth daily before breakfast.     losartan (COZAAR) 100 MG tablet Take 100 mg by mouth daily.     lubiprostone (AMITIZA) 24 MCG capsule Take 1 capsule (24 mcg total) by mouth 2 (two) times daily with a meal. 180 capsule 3   metoprolol tartrate (LOPRESSOR) 50 MG tablet Take 1 tablet (50 mg total) by mouth 2 (two) times daily. 180 tablet 3   morphine (MS CONTIN) 30 MG 12 hr tablet Take 30 mg by mouth every 12 (twelve) hours.     morphine (MSIR) 15 MG tablet Take 15 mg by mouth 3 (three) times daily as needed for severe pain (break through pain).     Multiple Vitamins-Minerals (HAIR SKIN & NAILS PO) Take 1 tablet by mouth daily.     nitroGLYCERIN (NITROSTAT) 0.4 MG SL tablet Place 0.4 mg under the tongue  every 5 (five) minutes as needed for chest pain.     omeprazole (PRILOSEC) 40 MG capsule Take 40 mg by mouth daily.     simvastatin (ZOCOR) 20 MG tablet Take 20 mg by mouth daily.     zolpidem (AMBIEN) 5 MG tablet Take 5 mg by mouth at bedtime.      No results found for this or any previous visit (from the past 48 hour(s)). No results found.  Review of Systems  Musculoskeletal:  Positive for neck pain.  Neurological:  Positive for numbness.    Blood pressure (!) 152/97, pulse 89, temperature 97.6 F (36.4 C), temperature source Oral, resp. rate 18, height 5\' 5"  (1.651 m), weight 90.7 kg, SpO2 93%. Physical Exam HENT:     Head: Normocephalic.     Right Ear: Tympanic membrane normal.     Nose: Nose normal.     Mouth/Throat:     Mouth: Mucous membranes are moist.  Eyes:     Pupils: Pupils are equal, round, and reactive to light.  Cardiovascular:     Rate and Rhythm: Normal rate.  Pulmonary:     Effort: Pulmonary effort is normal.  Abdominal:     General: Abdomen is flat.  Musculoskeletal:        General: Normal range of motion.  Neurological:     Mental Status: She is alert.     Comments: Patient is awake and alert strength is 5 of 5 deltoid, bicep, tricep, wrist flexion, wrist extension, hand intrinsics.      Assessment/Plan 72 year old presents for ACDF C5-6 C6-7  Mariam Dollar, MD 04/15/2023, 9:42 AM

## 2023-04-15 NOTE — Anesthesia Procedure Notes (Signed)
Procedure Name: Intubation Date/Time: 04/15/2023 10:43 AM  Performed by: Georgianne Fick D, CRNAPre-anesthesia Checklist: Patient identified, Emergency Drugs available, Suction available and Patient being monitored Patient Re-evaluated:Patient Re-evaluated prior to induction Oxygen Delivery Method: Circle System Utilized Preoxygenation: Pre-oxygenation with 100% oxygen Induction Type: IV induction Ventilation: Mask ventilation without difficulty Laryngoscope Size: Glidescope Tube type: Oral Tube size: 7.0 mm Number of attempts: 1 Airway Equipment and Method: Stylet and Oral airway Placement Confirmation: ETT inserted through vocal cords under direct vision, positive ETCO2 and breath sounds checked- equal and bilateral Secured at: 21 cm Tube secured with: Tape Dental Injury: Teeth and Oropharynx as per pre-operative assessment

## 2023-04-15 NOTE — Op Note (Signed)
Operative diagnosis: Cervical spondylosis with radiculopathy C5-6 C6-7.  Postoperative diagnosis: Same.  Procedure: Anterior cervical discectomies and fusion at C5-6 C6-7 utilizing titanium cages packed with locally harvested autograft mixed with Vivigen and anterior cervical plating utilizing the globus resonate plating system C5-6 C6-7.  Surgeon: Donalee Citrin.  Assistant: Julien Girt.  Anesthesia: General.  EBL: Minimal.  HPI: 72 year old female progressive worsening neck pain bilateral shoulder and arm pain rating down to her hand workup revealed severe cervical spondylosis C5-6 and C6-7.  Due to patient's progression of clinical syndrome imaging findings of a conservative treatment I recommended anterior cervical discectomies and fusion at those 2 levels.  I extensively reviewed the risks and benefits of the operation with her as well as perioperative course expectations of outcome and alternatives to surgery and she understood and agreed to proceed forward.  Operative procedure: Patient was brought into the OR was induced under general anesthesia positioned supine the neck in slight extension 5 pounds of halter traction.  The right side of her neck was prepped and draped in routine sterile fashion.  Preoperative x-ray localized the appropriate level so a curvilinear incision was made just off the midline to the antiviral sternocleidomastoid and the superficial abscess was dissected and divided longitudinally.  The avascular plane between the between the sternocleidomastoid and strap muscle was developed down to the prevertebral fascia which was dissected away with Kitners.  Interoperative x-ray identified the C4-5 disc base attention taken to the space below this annulotomy's were made with a 15 with scalpel anterior osteophytes were removed with a 2 mm Kerrison punch both disc bases were then drilled down capturing the bone shavings and mucus trap and under microscopic illumination first  working at C6-7 aggressive under biting both endplates allowed identification of posterior logical ligament which was removed in piecemeal fashion.  This decompressed central canal there was a large posterior spurring coming off both endplates causing severe left side cord compression this was all alleviated C7 pedicles identified C7 nerve roots were decompressed and skeletonized flush with the pedicle.  Attention was then directed to C5-6 in a similar fashion left-sided spurring off the endplates causing severe compression as well as right uncinate hypertrophy this was all removed both C6 nerve roots were skeletonized central canal was widely decompressed I sized up to 8 mm cages packed with locally harvested autograft mixed with Vivigen and inserted them in sized up to a 30 mm globus resonate plate this was then placed all screws had excellent purchase.  Wounds were copiously irrigated meticulous hemostasis was maintained some additional bone graft was packed laterally to the cages and anteriorly under the plate and then the wound was closed in layers with active Vicryl in the potassium and a running 4 subcuticular.  Dermabond benzoin Steri-Strips and sterile dressing was applied patient recovery in stable condition.  At the end the case all needle count sponge counts were correct.

## 2023-04-15 NOTE — Anesthesia Postprocedure Evaluation (Signed)
Anesthesia Post Note  Patient: [name]  Procedure(s) Performed: Anterior Cervical Decompression Fusion  Cervical five-Cervical six - Cervical six-Cervical seven     Patient location during evaluation: PACU Anesthesia Type: General Level of consciousness: awake and alert Pain management: pain level controlled Vital Signs Assessment: post-procedure vital signs reviewed and stable Respiratory status: spontaneous breathing, nonlabored ventilation, respiratory function stable and patient connected to nasal cannula oxygen Cardiovascular status: blood pressure returned to baseline and stable Postop Assessment: no apparent nausea or vomiting Anesthetic complications: no  No notable events documented.  Last Vitals:  Vitals:   04/15/23 1400 04/15/23 1415  BP: 106/71 (!) 115/59  Pulse: 80 74  Resp: 11 13  Temp:  36.6 C  SpO2: 92% 92%    Last Pain:  Vitals:   04/15/23 1400  TempSrc:   PainSc: 5                  Alfhild Partch,W. EDMOND

## 2023-04-15 NOTE — Progress Notes (Signed)
Pt reports she started feeling sick on 04/11/23 and was prescribed doxycycline. Pt states she had a low grade fever up too 100 until 2 days ago. Pt also reports experiencing aching, nausea, dry heaves, weakness and diarrhea. Pt reports no symptoms since yesterday. Pt reports that she notified Dr. Lonie Peak office, and was told to still come to hospital today as long as she did not have a fever. Pt reports that she has 2 doses of her doxycycline left to take. Pt denies any symptoms today. Pt states she has not been tested for anything. Dr Wynetta Emery and Dr. Sampson Goon notified.

## 2023-04-15 NOTE — Transfer of Care (Signed)
Immediate Anesthesia Transfer of Care Note  Patient: Selena [name]  Procedure(s) Performed: Anterior Cervical Decompression Fusion  Cervical five-Cervical six - Cervical six-Cervical seven  Patient Location: PACU  Anesthesia Type:General  Level of Consciousness: awake, alert , and oriented  Airway & Oxygen Therapy: Patient Spontanous Breathing and Patient connected to nasal cannula oxygen  Post-op Assessment: Report given to RN and Post -op Vital signs reviewed and stable  Post vital signs: Reviewed and stable  Last Vitals:  Vitals Value Taken Time  BP 124/61 04/15/23 1309  Temp    Pulse 81 04/15/23 1311  Resp 12 04/15/23 1311  SpO2 93 % 04/15/23 1311  Vitals shown include unfiled device data.  Last Pain:  Vitals:   04/15/23 0852  TempSrc:   PainSc: 6          Complications: No notable events documented.

## 2023-04-16 ENCOUNTER — Encounter (HOSPITAL_COMMUNITY): Payer: Self-pay | Admitting: Neurosurgery

## 2023-04-16 DIAGNOSIS — M4722 Other spondylosis with radiculopathy, cervical region: Secondary | ICD-10-CM | POA: Diagnosis not present

## 2023-04-16 MED ORDER — CYCLOBENZAPRINE HCL 10 MG PO TABS: 10.0000 mg | ORAL_TABLET | Freq: Three times a day (TID) | ORAL | 0 refills | Status: AC | PRN

## 2023-04-16 MED FILL — Thrombin For Soln 5000 Unit: CUTANEOUS | Qty: 2 | Status: AC

## 2023-04-16 NOTE — Evaluation (Signed)
Occupational Therapy Evaluation Patient Details Name: Selena [name] MRN: [id number] DOB: 1951-02-18 Today's Date: 04/16/2023   History of Present Illness 72 yo F s/p ACDF.  PMH includes: CVA, MI, HTN, COPD, CAD, B TKA, prior back surgeries.   Clinical Impression   Patient admitted for the procedure above.  PTA she lives at home with family, and remained independent with ADL,iADL and mobility.  She has an expected amount of post op discomfort, but remains at her baseline with no further acute OT indicated.  Precautions reviewed, questions answered, and patient is up and walking with out incident.  Recommend follow up with MD as prescribed.         If plan is discharge home, recommend the following: Assist for transportation    Functional Status Assessment  Patient has not had a recent decline in their functional status  Equipment Recommendations  None recommended by OT    Recommendations for Other Services       Precautions / Restrictions Precautions Precautions: Cervical Precaution Booklet Issued: Yes (comment) Required Braces or Orthoses: Cervical Brace Cervical Brace: Soft collar;For comfort Restrictions Weight Bearing Restrictions: No      Mobility Bed Mobility Overal bed mobility: Modified Independent                  Transfers Overall transfer level: Modified independent                        Balance Overall balance assessment: No apparent balance deficits (not formally assessed)                                         ADL either performed or assessed with clinical judgement   ADL Overall ADL's : At baseline                                             Vision Patient Visual Report: No change from baseline       Perception Perception: Within Functional Limits       Praxis Praxis: WFL       Pertinent Vitals/Pain Pain Assessment Pain Assessment: Faces Faces Pain Scale: Hurts a little bit Pain  Location: Chest Pain Descriptors / Indicators: Sore Pain Intervention(s): Monitored during session     Extremity/Trunk Assessment Upper Extremity Assessment Upper Extremity Assessment: Overall WFL for tasks assessed   Lower Extremity Assessment Lower Extremity Assessment: Overall WFL for tasks assessed   Cervical / Trunk Assessment Cervical / Trunk Assessment: Neck Surgery   Communication Communication Communication: No apparent difficulties   Cognition Arousal: Alert Behavior During Therapy: WFL for tasks assessed/performed Overall Cognitive Status: Within Functional Limits for tasks assessed                                       General Comments   VSS on RA    Exercises     Shoulder Instructions      Home Living Family/patient expects to be discharged to:: Private residence Living Arrangements: Children Available Help at Discharge: Family;Available 24 hours/day Type of Home: House Home Access: Stairs to enter [employer] of Steps: 3 Entrance Stairs-Rails: None Home Layout: One level  Bathroom Shower/Tub: Chief Strategy Officer: Handicapped height Bathroom Accessibility: Yes How Accessible: Accessible via walker Home Equipment: Agricultural consultant (2 wheels);Cane - single point          Prior Functioning/Environment Prior Level of Function : Independent/Modified Independent;Driving                        OT Problem List: Pain      OT Treatment/Interventions:      OT Goals(Current goals can be found in the care plan section) Acute Rehab OT Goals Patient Stated Goal: Return home OT Goal Formulation: With patient Time For Goal Achievement: 04/19/23 Potential to Achieve Goals: Good  OT Frequency:      Co-evaluation              AM-PAC OT "6 Clicks" Daily Activity     Outcome Measure Help from another person eating meals?: None Help from another person taking care of personal grooming?: None Help  from another person toileting, which includes using toliet, bedpan, or urinal?: None Help from another person bathing (including washing, rinsing, drying)?: None Help from another person to put on and taking off regular upper body clothing?: None Help from another person to put on and taking off regular lower body clothing?: None 6 Click Score: 24   End of Session Nurse Communication: Mobility status  Activity Tolerance: Patient tolerated treatment well Patient left: in bed;with call bell/phone within reach  OT Visit Diagnosis: Unsteadiness on feet (R26.81)                Time: 1610-9604 OT Time Calculation (min): 18 min Charges:  OT General Charges $OT Visit: 1 Visit OT Evaluation $OT Eval Moderate Complexity: 1 Mod  04/16/2023  RP, OTR/L  Acute Rehabilitation Services  Office:  [phone]   Suzanna Obey 04/16/2023, 9:18 AM

## 2023-04-16 NOTE — Progress Notes (Addendum)
Patient alert and oriented, ambulate, void, surgical site and dressing clean and intact. D/c instructions explain and given to the patient and husband all questions answered. Patient d/c home per order.

## 2023-04-16 NOTE — Discharge Summary (Signed)
Physician Discharge Summary  Patient ID: Selena [name] MRN: [id number] DOB/AGE: 71-Sep-1952 71 y.o.  Admit date: 04/15/2023 Discharge date: 04/16/2023  Admission Diagnoses: Cervical spondylosis with radiculopathy C5-6 C6-7.     Discharge Diagnoses: same   Discharged Condition: good  Hospital Course: The patient was admitted on 04/15/2023 and taken to the operating room where the patient underwent ACDF C5-6, 6-7. The patient tolerated the procedure well and was taken to the recovery room and then to the floor in stable condition. The hospital course was routine. There were no complications. The wound remained clean dry and intact. Pt had appropriate neck soreness. No complaints of arm pain or new N/T/W. The patient remained afebrile with stable vital signs, and tolerated a regular diet. The patient continued to increase activities, and pain was well controlled with oral pain medications.   Consults: None  Significant Diagnostic Studies:  Results for orders placed or performed during the hospital encounter of 04/09/23  Surgical pcr screen   Specimen: Nasal Mucosa; Nasal Swab  Result Value Ref Range   MRSA, PCR NEGATIVE NEGATIVE   Staphylococcus aureus NEGATIVE NEGATIVE  CBC  Result Value Ref Range   WBC 8.6 4.0 - 10.5 K/uL   RBC 5.43 (H) 3.87 - 5.11 MIL/uL   Hemoglobin 15.8 (H) 12.0 - 15.0 g/dL   HCT 85.2 (H) 77.8 - 24.2 %   MCV 89.5 80.0 - 100.0 fL   MCH 29.1 26.0 - 34.0 pg   MCHC 32.5 30.0 - 36.0 g/dL   RDW 35.3 61.4 - 43.1 %   Platelets 258 150 - 400 K/uL   nRBC 0.0 0.0 - 0.2 %  Basic metabolic panel  Result Value Ref Range   Sodium 140 135 - 145 mmol/L   Potassium 4.1 3.5 - 5.1 mmol/L   Chloride 101 98 - 111 mmol/L   CO2 28 22 - 32 mmol/L   Glucose, Bld 102 (H) 70 - 99 mg/dL   BUN 19 8 - 23 mg/dL   Creatinine, Ser 5.40 0.44 - 1.00 mg/dL   Calcium 08.6 (H) 8.9 - 10.3 mg/dL   GFR, Estimated >76 >19 mL/min   Anion gap 11 5 - 15    DG Cervical Spine 1 View  Result  Date: 04/15/2023 CLINICAL DATA:  ACDF C5-C7 EXAM: DG CERVICAL SPINE - 1 VIEW COMPARISON:  10/14/2009 radiographs FINDINGS: Two fluoroscopic images are obtained during the performance of the procedure and are provided for interpretation only. Images demonstrate anterior fusion with interbody disc spacers at C5-C7. Fluoroscopy time: 10 seconds Cumulative air kerma: 1.59 mGy IMPRESSION: Two fluoroscopic images are obtained during the performance of ACDF C5-C7. Electronically Signed   By: Wiliam Ke M.D.   On: 04/15/2023 16:36   DG C-Arm 1-60 Min-No Report  Result Date: 04/15/2023 Fluoroscopy was utilized by the requesting physician.  No radiographic interpretation.   DG C-Arm 1-60 Min-No Report  Result Date: 04/15/2023 Fluoroscopy was utilized by the requesting physician.  No radiographic interpretation.   DG C-Arm 1-60 Min-No Report  Result Date: 04/15/2023 Fluoroscopy was utilized by the requesting physician.  No radiographic interpretation.    Antibiotics:  Anti-infectives (From admission, onward)    Start     Dose/Rate Route Frequency Ordered Stop   04/15/23 1900  ceFAZolin (ANCEF) IVPB 2g/100 mL premix        2 g 200 mL/hr over 30 Minutes Intravenous Every 8 hours 04/15/23 1421 04/16/23 0334   04/15/23 1530  doxycycline (VIBRA-TABS) tablet 100 mg  100 mg Oral 2 times daily 04/15/23 1434     04/15/23 0845  vancomycin (VANCOCIN) IVPB 1000 mg/200 mL premix        1,000 mg 200 mL/hr over 60 Minutes Intravenous On call to O.R. 04/15/23 0840 04/15/23 1047       Discharge Exam: Blood pressure 132/60, pulse 93, temperature 98 F (36.7 C), temperature source Oral, resp. rate 19, height 5\' 5"  (1.651 m), weight 90.7 kg, SpO2 94%. Neurologic: Grossly normal Ambulating and voiding well incision cdi   Discharge Medications:   Allergies as of 04/16/2023       Reactions   Iohexol Shortness Of Breath, Swelling    Desc: Pt states she had Cardia Cath around 2000/ experienced SOB and  swelling of upper body during cath procedure/ tsf   Nubain [nalbuphine Hcl] Shortness Of Breath, Other (See Comments)   ??  DOSE RELATED  ??   Penicillins Itching, Rash, Other (See Comments)   Iodinated Contrast Media Swelling   Latex Rash        Medication List     TAKE these medications    albuterol 108 (90 Base) MCG/ACT inhaler Commonly known as: VENTOLIN HFA Inhale 2 puffs into the lungs every 4 (four) hours as needed for wheezing or shortness of breath. What changed: when to take this   aspirin EC 81 MG tablet Take 1 tablet (81 mg total) by mouth daily.   betamethasone dipropionate 0.05 % cream Apply 1 Application topically as needed (inflimation in legs).   chlorthalidone 25 MG tablet Commonly known as: HYGROTON Take 25 mg by mouth daily.   clobetasol 0.05 % external solution Commonly known as: TEMOVATE Apply 1 application  topically 2 (two) times daily. After washing hair   cyclobenzaprine 10 MG tablet Commonly known as: FLEXERIL Take 1 tablet (10 mg total) by mouth 3 (three) times daily as needed for muscle spasms.   desonide 0.05 % cream Commonly known as: DESOWEN Apply 1 application  topically as needed (for psoriasis on skin).   diclofenac sodium 1 % Gel Commonly known as: VOLTAREN Apply 1 Application topically as needed (for knee pain/arthritis).   doxycycline 100 MG tablet Commonly known as: VIBRA-TABS Take 100 mg by mouth 2 (two) times daily.   HAIR SKIN & NAILS PO Take 1 tablet by mouth daily.   ketoconazole 2 % cream Commonly known as: NIZORAL Apply 1 Application topically as needed (rash).   Klor-Con M10 10 MEQ tablet Generic drug: potassium chloride Take 10 mEq by mouth 2 (two) times daily.   levothyroxine 137 MCG tablet Commonly known as: SYNTHROID Take 137 mcg by mouth daily before breakfast.   losartan 100 MG tablet Commonly known as: COZAAR Take 100 mg by mouth daily.   lubiprostone 24 MCG capsule Commonly known as:  AMITIZA Take 1 capsule (24 mcg total) by mouth 2 (two) times daily with a meal.   metoprolol tartrate 50 MG tablet Commonly known as: LOPRESSOR Take 1 tablet (50 mg total) by mouth 2 (two) times daily.   morphine 30 MG 12 hr tablet Commonly known as: MS CONTIN Take 30 mg by mouth every 12 (twelve) hours.   morphine 15 MG tablet Commonly known as: MSIR Take 15 mg by mouth 3 (three) times daily as needed for severe pain (break through pain).   nitroGLYCERIN 0.4 MG SL tablet Commonly known as: NITROSTAT Place 0.4 mg under the tongue every 5 (five) minutes as needed for chest pain.   omeprazole 40 MG capsule Commonly  known as: PRILOSEC Take 40 mg by mouth daily.   simvastatin 20 MG tablet Commonly known as: ZOCOR Take 20 mg by mouth daily.   zolpidem 5 MG tablet Commonly known as: AMBIEN Take 5 mg by mouth at bedtime.        Disposition: home   Final Dx: acdf C5-6, C6-7  Discharge Instructions      Remove dressing in 72 hours   Complete by: As directed    Call MD for:   Complete by: As directed    Call MD for:  difficulty breathing, headache or visual disturbances   Complete by: As directed    Call MD for:  hives   Complete by: As directed    Call MD for:  persistant dizziness or light-headedness   Complete by: As directed    Call MD for:  persistant nausea and vomiting   Complete by: As directed    Call MD for:  redness, tenderness, or signs of infection (pain, swelling, redness, odor or green/yellow discharge around incision site)   Complete by: As directed    Call MD for:  severe uncontrolled pain   Complete by: As directed    Call MD for:  temperature >100.4   Complete by: As directed    Diet - low sodium heart healthy   Complete by: As directed    Driving Restrictions   Complete by: As directed    No driving for 2 weeks, no riding in the car for 1 week   Increase activity slowly   Complete by: As directed    Lifting restrictions   Complete by: As  directed    No lifting more than 8 lbs          Signed: Tiana Loft Heidi Lemay 04/16/2023, 7:39 AM

## 2023-04-16 NOTE — Plan of Care (Signed)

## 2023-07-22 ENCOUNTER — Other Ambulatory Visit (HOSPITAL_COMMUNITY): Payer: Self-pay | Admitting: Neurosurgery

## 2023-07-22 ENCOUNTER — Other Ambulatory Visit: Payer: Self-pay | Admitting: Gastroenterology

## 2023-07-22 DIAGNOSIS — K5900 Constipation, unspecified: Secondary | ICD-10-CM

## 2023-07-22 DIAGNOSIS — M542 Cervicalgia: Secondary | ICD-10-CM

## 2023-07-22 NOTE — Telephone Encounter (Signed)
Will send one refill. She needs OV - at least virtual for further refills.

## 2023-08-06 ENCOUNTER — Encounter (HOSPITAL_COMMUNITY): Payer: Self-pay

## 2023-08-06 ENCOUNTER — Ambulatory Visit (HOSPITAL_COMMUNITY): Payer: BC Managed Care – PPO

## 2023-08-08 ENCOUNTER — Telehealth: Payer: BC Managed Care – PPO | Admitting: Gastroenterology

## 2023-08-13 ENCOUNTER — Other Ambulatory Visit (HOSPITAL_COMMUNITY): Payer: BC Managed Care – PPO

## 2023-08-14 ENCOUNTER — Ambulatory Visit (HOSPITAL_COMMUNITY): Payer: BC Managed Care – PPO

## 2023-09-09 ENCOUNTER — Other Ambulatory Visit (HOSPITAL_COMMUNITY): Payer: Self-pay | Admitting: Internal Medicine

## 2023-09-09 DIAGNOSIS — Z1231 Encounter for screening mammogram for malignant neoplasm of breast: Secondary | ICD-10-CM

## 2023-09-14 ENCOUNTER — Ambulatory Visit (HOSPITAL_COMMUNITY)
Admission: RE | Admit: 2023-09-14 | Discharge: 2023-09-14 | Disposition: A | Discharge status: Home / Self Care | Payer: BC Managed Care – PPO | Source: Ambulatory Visit | Attending: Neurosurgery | Admitting: Neurosurgery

## 2023-09-14 DIAGNOSIS — M542 Cervicalgia: Secondary | ICD-10-CM | POA: Diagnosis present

## 2023-10-01 ENCOUNTER — Emergency Department (HOSPITAL_COMMUNITY)
Admission: EM | Admit: 2023-10-01 | Discharge: 2023-10-02 | Discharge status: Against Medical Advice | Payer: Medicare Other | Attending: Emergency Medicine | Admitting: Emergency Medicine

## 2023-10-01 ENCOUNTER — Other Ambulatory Visit: Payer: Self-pay

## 2023-10-01 ENCOUNTER — Telehealth: Payer: Self-pay | Admitting: Cardiology

## 2023-10-01 ENCOUNTER — Encounter (HOSPITAL_COMMUNITY): Payer: Self-pay | Admitting: Emergency Medicine

## 2023-10-01 ENCOUNTER — Emergency Department (HOSPITAL_COMMUNITY): Payer: Medicare Other

## 2023-10-01 DIAGNOSIS — R1310 Dysphagia, unspecified: Secondary | ICD-10-CM | POA: Insufficient documentation

## 2023-10-01 DIAGNOSIS — R0789 Other chest pain: Secondary | ICD-10-CM | POA: Diagnosis present

## 2023-10-01 DIAGNOSIS — Z5321 Procedure and treatment not carried out due to patient leaving prior to being seen by health care provider: Secondary | ICD-10-CM | POA: Diagnosis not present

## 2023-10-01 LAB — CBC
HCT: 48.1 % — ABNORMAL HIGH (ref 36.0–46.0)
Hemoglobin: 15.7 g/dL — ABNORMAL HIGH (ref 12.0–15.0)
MCH: 28.4 pg (ref 26.0–34.0)
MCHC: 32.6 g/dL (ref 30.0–36.0)
MCV: 87.1 fL (ref 80.0–100.0)
Platelets: 270 10*3/uL (ref 150–400)
RBC: 5.52 MIL/uL — ABNORMAL HIGH (ref 3.87–5.11)
RDW: 13.4 % (ref 11.5–15.5)
WBC: 7.8 10*3/uL (ref 4.0–10.5)
nRBC: 0 % (ref 0.0–0.2)

## 2023-10-01 LAB — BASIC METABOLIC PANEL
Anion gap: 13 (ref 5–15)
BUN: 19 mg/dL (ref 8–23)
CO2: 25 mmol/L (ref 22–32)
Calcium: 10.1 mg/dL (ref 8.9–10.3)
Chloride: 101 mmol/L (ref 98–111)
Creatinine, Ser: 0.64 mg/dL (ref 0.44–1.00)
GFR, Estimated: >60 mL/min (ref >60)
Glucose, Bld: 105 mg/dL — ABNORMAL HIGH (ref 70–99)
Potassium: 3.7 mmol/L (ref 3.5–5.1)
Sodium: 139 mmol/L (ref 135–145)

## 2023-10-01 LAB — TROPONIN I (HIGH SENSITIVITY)
Troponin I (High Sensitivity): 11 ng/L (ref <18)
Troponin I (High Sensitivity): 13 ng/L (ref <18)

## 2023-10-01 NOTE — Telephone Encounter (Signed)
 Spoke with pt who c/o chest pressure since yesterday. Pt states she had an episode about a week ago too. At this time pt states that she feels pressure in the chest at if something is "Sitting on my chest". Pt reports taking 2 nitroglycerin with no relief. Pt encouraged to be seen in the ER for evaluation.

## 2023-10-01 NOTE — ED Triage Notes (Signed)
 Pt reports she has chest tightness and difficulty swallowing. Pt reports she is weak and having a difficult time getting up. She reports she spoke with "the heart nurse" and was instructed to report to the ED. Pt reports she has taken 3 nitros starting around 1330 and experience no relief. PT reports pain of 7/10. Pt reports she also feels faint,

## 2023-10-01 NOTE — Telephone Encounter (Signed)
 Pt c/o Shortness Of Breath: STAT if SOB developed within the last 24 hours or pt is noticeably SOB on the phone  1. Are you currently SOB (can you hear that pt is SOB on the phone)?  No  2. How long have you been experiencing SOB?  Patient has had 2-3 episodes since last week.  3. Are you SOB when sitting or when up moving around?  Both   4. Are you currently experiencing any other symptoms?  Heart racing, weakness, chest pressure. Patient states she has taken nitroglycerin the past few days. Today she took 2.

## 2023-10-23 ENCOUNTER — Ambulatory Visit (HOSPITAL_COMMUNITY)
Admission: RE | Admit: 2023-10-23 | Discharge: 2023-10-23 | Disposition: A | Discharge status: Home / Self Care | Payer: BC Managed Care – PPO | Source: Ambulatory Visit | Attending: Internal Medicine | Admitting: Internal Medicine

## 2023-10-23 DIAGNOSIS — Z1231 Encounter for screening mammogram for malignant neoplasm of breast: Secondary | ICD-10-CM | POA: Insufficient documentation

## 2023-11-13 ENCOUNTER — Other Ambulatory Visit: Payer: Self-pay

## 2023-11-13 ENCOUNTER — Emergency Department (HOSPITAL_COMMUNITY)
Admission: EM | Admit: 2023-11-13 | Discharge: 2023-11-13 | Disposition: A | Discharge status: Home / Self Care | Attending: Emergency Medicine | Admitting: Emergency Medicine

## 2023-11-13 ENCOUNTER — Emergency Department (HOSPITAL_COMMUNITY)

## 2023-11-13 DIAGNOSIS — Z79899 Other long term (current) drug therapy: Secondary | ICD-10-CM | POA: Diagnosis not present

## 2023-11-13 DIAGNOSIS — R197 Diarrhea, unspecified: Secondary | ICD-10-CM | POA: Diagnosis not present

## 2023-11-13 DIAGNOSIS — R1032 Left lower quadrant pain: Secondary | ICD-10-CM | POA: Insufficient documentation

## 2023-11-13 DIAGNOSIS — J449 Chronic obstructive pulmonary disease, unspecified: Secondary | ICD-10-CM | POA: Diagnosis not present

## 2023-11-13 DIAGNOSIS — E039 Hypothyroidism, unspecified: Secondary | ICD-10-CM | POA: Insufficient documentation

## 2023-11-13 DIAGNOSIS — Z8673 Personal history of transient ischemic attack (TIA), and cerebral infarction without residual deficits: Secondary | ICD-10-CM | POA: Diagnosis not present

## 2023-11-13 DIAGNOSIS — I10 Essential (primary) hypertension: Secondary | ICD-10-CM | POA: Diagnosis not present

## 2023-11-13 DIAGNOSIS — Z7982 Long term (current) use of aspirin: Secondary | ICD-10-CM | POA: Insufficient documentation

## 2023-11-13 DIAGNOSIS — Z87891 Personal history of nicotine dependence: Secondary | ICD-10-CM | POA: Diagnosis not present

## 2023-11-13 DIAGNOSIS — R1030 Lower abdominal pain, unspecified: Secondary | ICD-10-CM | POA: Diagnosis not present

## 2023-11-13 DIAGNOSIS — Z91040 Latex allergy status: Secondary | ICD-10-CM | POA: Insufficient documentation

## 2023-11-13 DIAGNOSIS — Z96653 Presence of artificial knee joint, bilateral: Secondary | ICD-10-CM | POA: Insufficient documentation

## 2023-11-13 DIAGNOSIS — Z8541 Personal history of malignant neoplasm of cervix uteri: Secondary | ICD-10-CM | POA: Insufficient documentation

## 2023-11-13 DIAGNOSIS — R600 Localized edema: Secondary | ICD-10-CM | POA: Diagnosis not present

## 2023-11-13 LAB — COMPREHENSIVE METABOLIC PANEL WITH GFR
ALT: 28 U/L (ref 0–44)
AST: 42 U/L — ABNORMAL HIGH (ref 15–41)
Albumin: 3.5 g/dL (ref 3.5–5.0)
Alkaline Phosphatase: 78 U/L (ref 38–126)
Anion gap: 11 (ref 5–15)
BUN: 11 mg/dL (ref 8–23)
CO2: 27 mmol/L (ref 22–32)
Calcium: 9.6 mg/dL (ref 8.9–10.3)
Chloride: 99 mmol/L (ref 98–111)
Creatinine, Ser: 0.67 mg/dL (ref 0.44–1.00)
GFR, Estimated: >60 mL/min (ref >60)
Glucose, Bld: 104 mg/dL — ABNORMAL HIGH (ref 70–99)
Potassium: 3.3 mmol/L — ABNORMAL LOW (ref 3.5–5.1)
Sodium: 137 mmol/L (ref 135–145)
Total Bilirubin: 0.5 mg/dL (ref 0.0–1.2)
Total Protein: 6.7 g/dL (ref 6.5–8.1)

## 2023-11-13 LAB — CBC
HCT: 47.3 % — ABNORMAL HIGH (ref 36.0–46.0)
Hemoglobin: 15.4 g/dL — ABNORMAL HIGH (ref 12.0–15.0)
MCH: 28.7 pg (ref 26.0–34.0)
MCHC: 32.6 g/dL (ref 30.0–36.0)
MCV: 88.1 fL (ref 80.0–100.0)
Platelets: 269 K/uL (ref 150–400)
RBC: 5.37 MIL/uL — ABNORMAL HIGH (ref 3.87–5.11)
RDW: 13.2 % (ref 11.5–15.5)
WBC: 9 K/uL (ref 4.0–10.5)
nRBC: 0 % (ref 0.0–0.2)

## 2023-11-13 LAB — URINALYSIS, ROUTINE W REFLEX MICROSCOPIC
Bacteria, UA: NONE SEEN
Bilirubin Urine: NEGATIVE
Glucose, UA: NEGATIVE mg/dL
Hgb urine dipstick: NEGATIVE
Ketones, ur: NEGATIVE mg/dL
Nitrite: NEGATIVE
Protein, ur: NEGATIVE mg/dL
Specific Gravity, Urine: 1.012 (ref 1.005–1.030)
pH: 6 (ref 5.0–8.0)

## 2023-11-13 LAB — LIPASE, BLOOD: Lipase: 35 U/L (ref 11–51)

## 2023-11-13 LAB — LACTIC ACID, PLASMA: Lactic Acid, Venous: 1.1 mmol/L (ref 0.5–1.9)

## 2023-11-13 MED ORDER — METRONIDAZOLE 500 MG PO TABS: 500.0000 mg | ORAL_TABLET | Freq: Two times a day (BID) | ORAL | 0 refills | Status: DC

## 2023-11-13 MED ORDER — ONDANSETRON HCL 4 MG/2ML IJ SOLN
4.0000 mg | Freq: Once | INTRAMUSCULAR | Status: AC
  Administered 2023-11-13: 4 mg via INTRAVENOUS
  Filled 2023-11-13: qty 2

## 2023-11-13 MED ORDER — MORPHINE SULFATE (PF) 4 MG/ML IV SOLN
4.0000 mg | Freq: Once | INTRAVENOUS | Status: AC
  Administered 2023-11-13: 4 mg via INTRAVENOUS
  Filled 2023-11-13: qty 1

## 2023-11-13 MED ORDER — DICYCLOMINE HCL 20 MG PO TABS: 20.0000 mg | ORAL_TABLET | Freq: Two times a day (BID) | ORAL | 0 refills | Status: DC

## 2023-11-13 MED ORDER — DICYCLOMINE HCL 10 MG PO CAPS
10.0000 mg | ORAL_CAPSULE | Freq: Once | ORAL | Status: DC
  Filled 2023-11-13: qty 1

## 2023-11-13 MED ORDER — OXYCODONE-ACETAMINOPHEN 5-325 MG PO TABS
1.0000 | ORAL_TABLET | Freq: Once | ORAL | Status: DC
  Filled 2023-11-13: qty 1

## 2023-11-13 MED ORDER — CIPROFLOXACIN HCL 500 MG PO TABS: 500.0000 mg | ORAL_TABLET | Freq: Two times a day (BID) | ORAL | 0 refills | Status: AC

## 2023-11-13 NOTE — ED Provider Notes (Signed)
 [place] EMERGENCY DEPARTMENT AT [facility] Provider Note   CSN: [id number] Arrival date & time: 11/13/23  1224     History  Chief Complaint  Patient presents with   Abdominal Pain    Selena [name] is a 73 y.o. female.  73 year old female presenting with 2-1/2 weeks of abdominal pain.  Patient reports that she presented to her primary care provider about 2 weeks ago due to this pain, she was prescribed 2 medications which she did not bring with her today, but she believes at least 1 of these was an antibiotic to treat "inflamed intestines".  She reports nearly constant abdominal pain with localization to the left lower quadrant.  Her pain is exacerbated by taking deep breaths, positional changes, and after eating a meal.  She denies hematochezia, melena, nausea, vomiting, dysuria, hematuria.  She had diarrhea on Monday and has not had a bowel movement since; she was on lubiprostone for constipation, but discontinued this several days ago as she was concerned it may be contributing to her abdominal pain.  She admits that she has not been eating or drinking very much over the past day or so secondary to pain, and she did have a low-grade temperature of around 100 F last night. She takes morphine for her back pain and has not taken any additional medications to manage her abdominal pain.   Abdominal Pain Associated symptoms: constipation, diarrhea and fever   Associated symptoms: no dysuria, no hematuria, no nausea and no vomiting        Home Medications Prior to Admission medications   Medication Sig Start Date End Date Taking? Authorizing Provider  albuterol (PROVENTIL HFA;VENTOLIN HFA) 108 (90 Base) MCG/ACT inhaler Inhale 2 puffs into the lungs every 4 (four) hours as needed for wheezing or shortness of breath. Patient taking differently: Inhale 2 puffs into the lungs as needed for wheezing or shortness of breath. 09/22/16   Samuel Jester, DO  aspirin EC 81 MG tablet  Take 1 tablet (81 mg total) by mouth daily. 07/10/17   Dyann Kief, PA-C  betamethasone dipropionate 0.05 % cream Apply 1 Application topically as needed (inflimation in legs). 03/11/23   [provider]  chlorthalidone (HYGROTON) 25 MG tablet Take 25 mg by mouth daily.    [provider]  clobetasol (TEMOVATE) 0.05 % external solution Apply 1 application  topically 2 (two) times daily. After washing hair 06/24/17   [provider]  cyclobenzaprine (FLEXERIL) 10 MG tablet Take 1 tablet (10 mg total) by mouth 3 (three) times daily as needed for muscle spasms. 04/16/23   Meyran, Tiana Loft, NP  desonide (DESOWEN) 0.05 % cream Apply 1 application  topically as needed (for psoriasis on skin). 04/11/17   [provider]  diclofenac sodium (VOLTAREN) 1 % GEL Apply 1 Application topically as needed (for knee pain/arthritis). 05/10/16   [provider]  doxycycline (VIBRA-TABS) 100 MG tablet Take 100 mg by mouth 2 (two) times daily. 04/11/23   [provider]  ketoconazole (NIZORAL) 2 % cream Apply 1 Application topically as needed (rash). 01/14/23   [provider]  KLOR-CON M10 10 MEQ tablet Take 10 mEq by mouth 2 (two) times daily. 01/06/23   [provider]  levothyroxine (SYNTHROID) 137 MCG tablet Take 137 mcg by mouth daily before breakfast.    [provider]  losartan (COZAAR) 100 MG tablet Take 100 mg by mouth daily.    [provider]  lubiprostone (AMITIZA) 24 MCG  capsule Take 1 capsule (24 mcg total) by mouth 2 (two) times daily with a meal. 07/22/23 07/16/24  Aida Raider, NP  metoprolol tartrate (LOPRESSOR) 50 MG tablet Take 1 tablet (50 mg total) by mouth 2 (two) times daily. 04/09/19   Laqueta Linden, MD  morphine (MS CONTIN) 30 MG 12 hr tablet Take 30 mg by mouth every 12 (twelve) hours.    [provider]  morphine (MSIR) 15 MG tablet Take 15 mg by mouth 3 (three) times daily as needed  for severe pain (break through pain). 05/15/16   [provider]  Multiple Vitamins-Minerals (HAIR SKIN & NAILS PO) Take 1 tablet by mouth daily.    [provider]  nitroGLYCERIN (NITROSTAT) 0.4 MG SL tablet Place 0.4 mg under the tongue every 5 (five) minutes as needed for chest pain.    [provider]  omeprazole (PRILOSEC) 40 MG capsule Take 40 mg by mouth daily.    [provider]  simvastatin (ZOCOR) 20 MG tablet Take 20 mg by mouth daily. 11/24/10   [provider]  zolpidem (AMBIEN) 5 MG tablet Take 5 mg by mouth at bedtime. 03/21/23   [provider]      Allergies    Iohexol, Nubain [nalbuphine hcl], Penicillins, Iodinated contrast media, and Latex    Review of Systems   Review of Systems  Constitutional:  Positive for fever.  Gastrointestinal:  Positive for abdominal pain, constipation and diarrhea. Negative for blood in stool, nausea and vomiting.  Genitourinary:  Negative for dysuria and hematuria.    Physical Exam Updated Vital Signs BP (!) 153/82 (BP Location: Right Arm)   Pulse 77   Temp 98.8 F (37.1 C) (Oral)   Resp 18   Ht 5\' 5"  (1.651 m)   Wt 81.6 kg   SpO2 96%   BMI 29.95 kg/m  Physical Exam Vitals and nursing note reviewed.  Constitutional:      General: She is not in acute distress. HENT:     Head: Normocephalic and atraumatic.  Eyes:     Extraocular Movements: Extraocular movements intact.  Cardiovascular:     Pulses: Normal pulses.     Heart sounds: Murmur heard.  Pulmonary:     Effort: Pulmonary effort is normal. No respiratory distress.     Breath sounds: Normal breath sounds.  Abdominal:     General: Bowel sounds are normal.     Palpations: Abdomen is soft.     Tenderness: There is abdominal tenderness (diffuse with predominance in LLQ). There is right CVA tenderness, left CVA tenderness and guarding.  Musculoskeletal:     Cervical back: Neck supple.     Right lower leg: Edema  (nonpitting) present.     Left lower leg: Edema (nonpitting) present.  Skin:    General: Skin is warm and dry.  Neurological:     Mental Status: She is alert. Mental status is at baseline.     ED Results / Procedures / Treatments   Labs (all labs ordered are listed, but only abnormal results are displayed) Labs Reviewed  COMPREHENSIVE METABOLIC PANEL WITH GFR - Abnormal; Notable for the following components:      Result Value   Potassium 3.3 (*)    Glucose, Bld 104 (*)    AST 42 (*)    All other components within normal limits  CBC - Abnormal; Notable for the following components:   RBC 5.37 (*)    Hemoglobin 15.4 (*)    HCT  47.3 (*)    All other components within normal limits  URINALYSIS, ROUTINE W REFLEX MICROSCOPIC - Abnormal; Notable for the following components:   APPearance HAZY (*)    Leukocytes,Ua SMALL (*)    All other components within normal limits  LIPASE, BLOOD  LACTIC ACID, PLASMA    EKG None  Radiology No results found.  Procedures Procedures    Medications Ordered in ED Medications - No data to display  ED Course/ Medical Decision Making/ A&P                                 Medical Decision Making Patient presents with diffuse abdominal pain.  Differential diagnosis includes mesenteric ischemia, diverticulitis, nephrolithiasis, pancreatitis, chronic constipation.  Low suspicion for pancreatitis based on diffuse nature of abdominal pain, as well as lipase within normal limits.  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  Lactate 1.1, low suspicion for mesenteric ischemia. CT abdomen pelvis w/o contrast interpretation pending.  Pertinent labs are as follows: Urine hazy in appearance, urinalysis notable for small leukocytes, however patient is asymptomatic.  CBC and CMP are unremarkable.  Lipase is within normal  limits.  Spoke with Lesle Reek PA-C at the end of my shift, he will assume care of this patient.  Amount and/or Complexity of Data Reviewed Labs: ordered. Radiology: ordered.           Final Clinical Impression(s) / ED Diagnoses Final diagnoses:  None    Rx / DC Orders ED Discharge Orders     None         Sara Chu 11/13/23 1536    Eber Hong, MD 11/14/23 1028

## 2023-11-13 NOTE — ED Notes (Signed)
 Dr. Baltazar Apo at bedside.

## 2023-11-13 NOTE — ED Provider Notes (Signed)
  Physical Exam  BP 126/68 (BP Location: Right Arm)   Pulse 97   Temp 98.7 F (37.1 C) (Oral)   Resp 18   Ht 5\' 5"  (1.651 m)   Wt 81.6 kg   SpO2 93%   BMI 29.95 kg/m   Physical Exam  Procedures  Procedures  ED Course / MDM   Clinical Course as of 11/13/23 1757  Wed Nov 13, 2023  1613 CT ABDOMEN PELVIS WO CONTRAST [CR]    Clinical Course User Index [CR] Lelon Perla, PA-C   Medical Decision Making Amount and/or Complexity of Data Reviewed Labs: ordered. Radiology: ordered. Decision-making details documented in ED Course.  Risk Prescription drug management.   This patient presents to the ED for concern of abdominal pain differential diagnosis includes acute appendicitis, cholecystitis, bowel torsion, diverticulitis, ovarian torsion or cyst, tumor and abscess, pyelonephritis, kidney stone, pancreatitis    Additional history obtained:  Additional history obtained from none External records from outside source obtained and reviewed including none   Lab Tests:  I Ordered, and personally interpreted labs.  The pertinent results include: No leukocytosis, no anemia, normal electrolytes, normal kidney function, unremarkable urinalysis, normal lipase, normal lactic acid   Imaging Studies ordered:  I ordered imaging studies including CT scan of the abdomen and pelvis I independently visualized and interpreted imaging which showed concerns for dilated appendix I agree with the radiologist interpretation   Medicines ordered and prescription drug management:  I ordered medication including morphine and Zofran for abdominal pain Reevaluation of the patient after these medicines showed that the patient improved I have reviewed the patients home medicines and have made adjustments as needed   Problem List / ED Course:  Patient is doing well at this time and is stable for discharge home.  Patient was fully evaluated by general surgery, Dr. Henreitta Leber.  Patient is  already status post appendectomy and Dr. Henreitta Leber believes that this is her stump that may be inflamed.  She recommends continuing another week of Cipro and Flagyl and she will follow her on an outpatient basis.  Strict return precautions were provided for any new or worsening symptoms.  Patient has voiced understanding to this plan and had no additional questions.  She was also directed to follow-up closely with her primary care doctor on an outpatient basis.   Social Determinants of Health:  None          Kathlen Mody 11/13/23 1843    Eber Hong, MD 11/14/23 1028

## 2023-11-13 NOTE — Consult Note (Signed)
 [facility] Consult  Reason for Consult: ? Appendicitis, lower abdominal pain  Referring Physician: Huston Foley, PA ED   Chief Complaint   Abdominal Pain     HPI: Selena [name] is a 73 y.o. female with reported left lower/ lower abdominal pain since waking up on 3/31. She went to her PCP, Dr. Ouida Sills and was treated for diverticulitis with Cipro, Flagyl. She has an extensive abdominal pain history with notes dating back to 89s where she had chronic abdominal pain and concern in the past for functional abdominal pain. She says that this has been better recently but she is on chronic pain medication through a pain clinic.  She has documentation since the early 2000s of having an appendectomy. I cannot find the operative note but I see this documented in Dr. Cira Servant note as early as 2009.  A CT from 2009 also documented this. She was seen in the ED and the CT read showed concern for an appendix that was dilated at 8mm but with no stranding or inflammatory changes.  I called the radiologist and reviewed the images with him on the phone and explained the situation. She does have what appears to be a stump of the appendix going lateral/ posterior from the cecum.  There is otherwise no inflammation noted. There is no signes of diverticulitis but she has been on antibiotics. She has had colonoscopy in the past that did show wide mouthed diverticula. She denies any dark stools or blood per rectum. She continues to have pain that is worse in the left lower abdomen.    She reports having a PCN allergy to "penicillin" but says she took Amoxicillin in the past. I tried to call [facility] who say that they can only see 2 years back but no Amoxicillin or Augmentin prescribed in that time.   She says she has had some diarrhea with the antibiotics but also has been on a new chronic constipation medication.   She also reports a contrast allergy from IV contrast but says she has been premedicated  in the past and did fine with it. I see no IV contrast CT scans in our system.    Past Medical History:  Diagnosis Date   Abnormal findings on imaging of biliary tract 1997-2000   CBD 21mm, Dr Danny Lawless biliary/pancreatic manometry, EUS Dr. Christella Hartigan 09/18/07 dilated but otherwise normal extrahepatic duct, no masses   Anemia    Arthritis    Asthma    Bursitis    Cervical ca (HCC) 1984   partial hysterectomy   Chronic abdominal pain    Functional, seen @[facility] , extensive WU here benign    Chronic back pain    On methadone   Constipation    COPD (chronic obstructive pulmonary disease) (HCC)    Degenerative lumbar spinal stenosis 10/2022   Depression    w/ menopause   Dyspnea    with exertion   Essential hypertension    GERD (gastroesophageal reflux disease)    H/O hiatal hernia    Headache    Hx of migraines   Hemorrhoids    Hyperlipemia    Hypothyroidism    MI (myocardial infarction) (HCC) 2000   Details not clear   Opioid dependence (HCC)    morphine   Osteoarthritis    Palpitations    PVCs   Pneumonia    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 Teddy Spike sec  to tortuous colon, followed by BE & flex sig normal   Skin cancer    Nose   TIA (transient ischemic attack) 1983    Past Surgical History:  Procedure Laterality Date   ABDOMINAL EXPOSURE N/A 10/05/2019   Procedure: ABDOMINAL EXPOSURE;  Surgeon: Cephus Shelling, MD;  Location: [facility] OR;  Service: Vascular;  Laterality: N/A;   ABDOMINAL HYSTERECTOMY     ANTERIOR CERVICAL DECOMP/DISCECTOMY FUSION N/A 04/15/2023   Procedure: Anterior Cervical Decompression Fusion  Cervical five-Cervical six - Cervical six-Cervical seven;  Surgeon: Donalee Citrin, MD;  Location: [facility] OR;  Service: Neurosurgery;  Laterality: N/A;   ANTERIOR LUMBAR FUSION N/A 10/05/2019   Procedure: Anterior Lumbar Interbody Fusion - Lumbar Five-Sacral One;   Surgeon: Donalee Citrin, MD;  Location: [facility] OR;  Service: Neurosurgery;  Laterality: N/A;  Anterior Lumbar Interbody Fusion - Lumbar Five-Sacral One   APPENDECTOMY     arthroscopic knee     BACK SURGERY     BIOPSY  12/18/2021   Procedure: BIOPSY;  Surgeon: Lanelle Bal, DO;  [facility]: [facility];  Service: Endoscopy;;   CARDIAC CATHETERIZATION  2005   CATARACT EXTRACTION W/PHACO Right 09/02/2014   Procedure: CATARACT EXTRACTION PHACO AND INTRAOCULAR LENS PLACEMENT RIGHT EYE;  Surgeon: Gemma Payor, MD;  Location: [facility] ORS;  Service: Ophthalmology;  Laterality: Right;  CDE:8.96   CATARACT EXTRACTION W/PHACO Left 09/13/2014   Procedure: CATARACT EXTRACTION PHACO AND INTRAOCULAR LENS PLACEMENT LEFT EYE;  Surgeon: Gemma Payor, MD;  Location: [facility] ORS;  Service: Ophthalmology;  Laterality: Left;  CDE 6.20   CHOLECYSTECTOMY     COLONOSCOPY WITH PROPOFOL N/A 12/18/2021   Procedure: COLONOSCOPY WITH PROPOFOL;  Surgeon: Lanelle Bal, DO;  [facility]: [facility];  Service: Endoscopy;  Laterality: N/A;  12:15pm   ESOPHAGOGASTRODUODENOSCOPY (EGD) WITH PROPOFOL N/A 12/18/2021   Procedure: ESOPHAGOGASTRODUODENOSCOPY (EGD) WITH PROPOFOL;  Surgeon: Lanelle Bal, DO;  Location: [facility];  Service: Endoscopy;  Laterality: N/A;   EYE SURGERY Bilateral    cataract removal   INGUINAL HERNIA REPAIR     left   JOINT REPLACEMENT Bilateral    knees   LEFT HEART CATH AND CORONARY ANGIOGRAPHY N/A 10/19/2020   Procedure: LEFT HEART CATH AND CORONARY ANGIOGRAPHY;  Surgeon: Lyn Records, MD;  Location: [facility];  Service: Cardiovascular;  Laterality: N/A;   PARTIAL HYSTERECTOMY     right arm     nerve   TONSILLECTOMY     TOTAL KNEE ARTHROPLASTY  06/20/2012   Procedure: TOTAL KNEE ARTHROPLASTY;  Surgeon: Thera Flake., MD;  Location: [facility] OR;  Service: Orthopedics;  Laterality: Right;   TOTAL KNEE ARTHROPLASTY Left 04/02/2014   Procedure: TOTAL KNEE ARTHROPLASTY;  Surgeon: Thera Flake.,  MD;  Location: [facility] OR;  Service: Orthopedics;  Laterality: Left;    Family History  Problem Relation Age of Onset   Ulcers Brother    Heart attack Brother    Ulcers Sister    Thyroid disease Daughter    Hypertension Daughter    Other Daughter        nervous   Hypertension Sister    Heart disease Sister    Thyroid disease Sister    Hypertension Sister    Heart disease Sister    Thyroid disease Sister    Heart attack Brother    Cancer Brother        prostate    Social History   Tobacco Use   Smoking status: Former  Current packs/day: 0.00    Average packs/day: 1 pack/day for 22.4 years (22.4 ttl pk-yrs)    Types: Cigarettes    Start date: 08/06/1981    Quit date: 01/05/2004    Years since quitting: 19.8   Smokeless tobacco: Never   Tobacco comments:    Quit in 2005  Vaping Use   Vaping status: Never Used  Substance Use Topics   Alcohol use: No    Alcohol/week: 0.0 standard drinks of alcohol   Drug use: No    Medications: I have reviewed the patient's current medications. No current facility-administered medications for this encounter.   Current Outpatient Medications  Medication Sig Dispense Refill Last Dose/Taking   ciprofloxacin (CIPRO) 500 MG tablet Take 1 tablet (500 mg total) by mouth every 12 (twelve) hours for 7 days. 14 tablet 0 Taking   dicyclomine (BENTYL) 20 MG tablet Take 1 tablet (20 mg total) by mouth 2 (two) times daily. 20 tablet 0 Taking   metroNIDAZOLE (FLAGYL) 500 MG tablet Take 1 tablet (500 mg total) by mouth 2 (two) times daily. 14 tablet 0 Taking   albuterol (PROVENTIL HFA;VENTOLIN HFA) 108 (90 Base) MCG/ACT inhaler Inhale 2 puffs into the lungs every 4 (four) hours as needed for wheezing or shortness of breath. (Patient taking differently: Inhale 2 puffs into the lungs as needed for wheezing or shortness of breath.) 1 Inhaler 0    aspirin EC 81 MG tablet Take 1 tablet (81 mg total) by mouth daily. 90 tablet 3    betamethasone dipropionate 0.05  % cream Apply 1 Application topically as needed (inflimation in legs).      chlorthalidone (HYGROTON) 25 MG tablet Take 25 mg by mouth daily.      clobetasol (TEMOVATE) 0.05 % external solution Apply 1 application  topically 2 (two) times daily. After washing hair  3    cyclobenzaprine (FLEXERIL) 10 MG tablet Take 1 tablet (10 mg total) by mouth 3 (three) times daily as needed for muscle spasms. 30 tablet 0    desonide (DESOWEN) 0.05 % cream Apply 1 application  topically as needed (for psoriasis on skin).  3    diclofenac sodium (VOLTAREN) 1 % GEL Apply 1 Application topically as needed (for knee pain/arthritis).      doxycycline (VIBRA-TABS) 100 MG tablet Take 100 mg by mouth 2 (two) times daily.      ketoconazole (NIZORAL) 2 % cream Apply 1 Application topically as needed (rash).      KLOR-CON M10 10 MEQ tablet Take 10 mEq by mouth 2 (two) times daily.      levothyroxine (SYNTHROID) 137 MCG tablet Take 137 mcg by mouth daily before breakfast.      losartan (COZAAR) 100 MG tablet Take 100 mg by mouth daily.      lubiprostone (AMITIZA) 24 MCG capsule Take 1 capsule (24 mcg total) by mouth 2 (two) times daily with a meal. 180 capsule 1    metoprolol tartrate (LOPRESSOR) 50 MG tablet Take 1 tablet (50 mg total) by mouth 2 (two) times daily. 180 tablet 3    morphine (MS CONTIN) 30 MG 12 hr tablet Take 30 mg by mouth every 12 (twelve) hours.      morphine (MSIR) 15 MG tablet Take 15 mg by mouth 3 (three) times daily as needed for severe pain (break through pain).      Multiple Vitamins-Minerals (HAIR SKIN & NAILS PO) Take 1 tablet by mouth daily.      nitroGLYCERIN (NITROSTAT) 0.4 MG SL  tablet Place 0.4 mg under the tongue every 5 (five) minutes as needed for chest pain.      omeprazole (PRILOSEC) 40 MG capsule Take 40 mg by mouth daily.      simvastatin (ZOCOR) 20 MG tablet Take 20 mg by mouth daily.      zolpidem (AMBIEN) 5 MG tablet Take 5 mg by mouth at bedtime.        Allergies  Allergen  Reactions   Iohexol Shortness Of Breath and Swelling     Desc: Pt states she had Cardia Cath around 2000/ experienced SOB and swelling of upper body during cath procedure/ tsf    Nubain [Nalbuphine Hcl] Shortness Of Breath and Other (See Comments)    ??  DOSE RELATED  ??   Penicillins Itching, Rash and Other (See Comments)   Iodinated Contrast Media Swelling   Latex Rash     ROS:  A comprehensive review of systems was negative except for: Gastrointestinal: positive for abdominal pain, nausea, and poor appetite  Blood pressure 124/70, pulse 96, temperature 98.7 F (37.1 C), temperature source Oral, resp. rate 16, height 5\' 5"  (1.651 m), weight 81.6 kg, SpO2 99%. Physical Exam Vitals reviewed.  Eyes:     Extraocular Movements: Extraocular movements intact.  Cardiovascular:     Rate and Rhythm: Normal rate.  Pulmonary:     Effort: Pulmonary effort is normal.  Abdominal:     General: There is no distension.     Palpations: Abdomen is soft.     Tenderness: There is abdominal tenderness in the suprapubic area and left lower quadrant.     Comments: No real pain in the area of the appendix / right lateral/posterior   Musculoskeletal:     Comments: Edema bilaterally, varicose veins   Skin:    General: Skin is warm.  Neurological:     Mental Status: She is alert and oriented to person, place, and time.  Psychiatric:        Mood and Affect: Mood normal.     Results: Results for orders placed or performed during the hospital encounter of 11/13/23 (from the past 48 hours)  Lipase, blood     Status: None   Collection Time: 11/13/23  1:03 PM  Result Value Ref Range   Lipase 35 11 - 51 U/L    Comment: Performed at [facility], [address], [place], [place] [address]  Comprehensive metabolic panel     Status: Abnormal   Collection Time: 11/13/23  1:03 PM  Result Value Ref Range   Sodium 137 135 - 145 mmol/L   Potassium 3.3 (L) 3.5 - 5.1 mmol/L   Chloride 99 98 - 111 mmol/L    CO2 27 22 - 32 mmol/L   Glucose, Bld 104 (H) 70 - 99 mg/dL    Comment: Glucose reference range applies only to samples taken after fasting for at least 8 hours.   BUN 11 8 - 23 mg/dL   Creatinine, Ser 6.04 0.44 - 1.00 mg/dL   Calcium 9.6 8.9 - 54.0 mg/dL   Total Protein 6.7 6.5 - 8.1 g/dL   Albumin 3.5 3.5 - 5.0 g/dL   AST 42 (H) 15 - 41 U/L   ALT 28 0 - 44 U/L   Alkaline Phosphatase 78 38 - 126 U/L   Total Bilirubin 0.5 0.0 - 1.2 mg/dL   GFR, Estimated >98 >11 mL/min    Comment: (NOTE) Calculated using the CKD-EPI Creatinine Equation (2021)    Anion gap 11  5 - 15    Comment: Performed at [facility], [address], [place], [place] [address]  CBC     Status: Abnormal   Collection Time: 11/13/23  1:03 PM  Result Value Ref Range   WBC 9.0 4.0 - 10.5 K/uL   RBC 5.37 (H) 3.87 - 5.11 MIL/uL   Hemoglobin 15.4 (H) 12.0 - 15.0 g/dL   HCT 60.4 (H) 54.0 - 98.1 %   MCV 88.1 80.0 - 100.0 fL   MCH 28.7 26.0 - 34.0 pg   MCHC 32.6 30.0 - 36.0 g/dL   RDW 19.1 47.8 - 29.5 %   Platelets 269 150 - 400 K/uL   nRBC 0.0 0.0 - 0.2 %    Comment: Performed at [facility], [address], [place], [place] [address]  Urinalysis, Routine w reflex microscopic -Urine, Clean Catch     Status: Abnormal   Collection Time: 11/13/23  1:05 PM  Result Value Ref Range   Color, Urine YELLOW YELLOW   APPearance HAZY (A) CLEAR   Specific Gravity, Urine 1.012 1.005 - 1.030   pH 6.0 5.0 - 8.0   Glucose, UA NEGATIVE NEGATIVE mg/dL   Hgb urine dipstick NEGATIVE NEGATIVE   Bilirubin Urine NEGATIVE NEGATIVE   Ketones, ur NEGATIVE NEGATIVE mg/dL   Protein, ur NEGATIVE NEGATIVE mg/dL   Nitrite NEGATIVE NEGATIVE   Leukocytes,Ua SMALL (A) NEGATIVE   RBC / HPF 0-5 0 - 5 RBC/hpf   WBC, UA 0-5 0 - 5 WBC/hpf   Bacteria, UA NONE SEEN NONE SEEN   Squamous Epithelial / HPF 6-10 0 - 5 /HPF   Mucus PRESENT    Hyaline Casts, UA PRESENT     Comment: Performed at [facility], [address] [place], [place] [address]   Lactic acid, plasma     Status: None   Collection Time: 11/13/23  2:20 PM  Result Value Ref Range   Lactic Acid, Venous 1.1 0.5 - 1.9 mmol/L    Comment: Performed at [facility], [address], [place], [place] [address]   Personally reviewed and reviewed 2009 scan. Reviewed with radiologist too on the phone. Showed the patient, I see a stump of appendix, posterior lateral to the cecum about ~3cm in length, 8mm wide, no stranding or inflammation round it, cannot  see this on the 2009 exam   CT ABDOMEN PELVIS WO CONTRAST Addendum Date: 11/13/2023 ADDENDUM REPORT: 11/13/2023 18:24 ADDENDUM: I received a call from the consulting surgeon, Dr. Algis Greenhouse, regarding the patient's clinical history. There is a report of a prior appendectomy and this, blind-ending structure lateral to the cecum could represent the appendiceal stump, which is fluid-filled and mildly dilated. Clinically, the patient is having left-sided pain and has been recently treated for diverticulitis by her PCP. If repeat imaging is clinically desired, the use of intravenous and oral contrast should be considered. Case findings were discussed via telephone at 6:11 p.m. on 11/13/2023. Electronically Signed   By: Wallie Char M.D.   On: 11/13/2023 18:24   Result Date: 11/13/2023 CLINICAL DATA:  Abdominal pain, acute, nonlocalized EXAM: CT ABDOMEN AND PELVIS WITHOUT CONTRAST TECHNIQUE: Multidetector CT imaging of the abdomen and pelvis was performed following the standard protocol without IV contrast. RADIATION DOSE REDUCTION: This exam was performed according to the departmental dose-optimization program which includes automated exposure control, adjustment of the mA and/or kV according to patient size and/or use of iterative reconstruction technique. COMPARISON:  August 15, 2007 FINDINGS: Lower chest: Patchy reticular opacities within  both lower lobes dependently. Moderate cardiomegaly.No pleural effusion. Hepatobiliary: No  mass.Cholecystectomy.Diffuse dilation of the common bile duct, grossly unchanged, without intrahepatic biliary ductal dilation or radiopaque stones. This is likely related to the prior cholecystectomy. Pancreas: Diffuse fatty atrophy of the pancreatic parenchyma. No mass or ductal dilation.No peripancreatic inflammation or fluid collection. Spleen: Normal size. No mass. Adrenals/Urinary Tract: Unchanged 1 cm right adrenal nodule, given the stability, likely benign. No renal mass. No nephrolithiasis or hydronephrosis. Decompressed urinary bladder without visualized focal abnormality. Stomach/Bowel: The stomach is decompressed without focal abnormality. No small bowel wall thickening or inflammation. No small bowel obstruction. The appendix is fluid-filled, and dilated distally, measuring 8 mm. No definite periappendiceal inflammation or abscess. Vascular/Lymphatic: No aortic aneurysm. Diffuse aortoiliac atherosclerosis. No intraabdominal or pelvic lymphadenopathy. Reproductive: Hysterectomy. No concerning adnexal mass.No free pelvic fluid. Other: No pneumoperitoneum, ascites, or mesenteric inflammation. Musculoskeletal: No acute fracture or destructive lesion.Diffuse osteopenia. Multilevel degenerative disc disease of the spine with minimal focal lateral listhesis of L4 on L5. Posterior fusion hardware at L2-L3 with anterior fusion/disc replacement at L5-S1. mild bilateral hip osteoarthritis. IMPRESSION: 1. Fluid-filled appendix, which is mildly dilated distally measuring 8 mm. While no definite periappendiceal inflammation is visualized, in the correct clinical context, this could represent changes of early acute appendicitis. Surgical consultation recommended. 2. Unchanged right adrenal nodule measuring 1 cm, which given the stability, is likely a benign adenoma. 3. Patchy reticular opacities in both lower lobes, favored to represent scarring or subsegmental atelectasis. Electronically Signed: By: Wallie Char  M.D. On: 11/13/2023 16:11     Assessment & Plan:  CASSIOPEIA FLORENTINO is a 73 y.o. female with an appendiceal stump but no signs of appendicitis based on my exam or the imaging. This is a little confounded by the antibiotics but again her pain that she describes is more on the left side and this is where is started and has remained for the most part. I would not recommended a redo appendectomy at this time as I think he risk of the procedure, risk of anesthesia out weigh the benefits given the clinical presentation.  I gave her some options. Inpatient admission- IV antibiotics, diet trial  Outpatient management- attempt to change up antibiotic or at least extend, diet at home   She wants to try the outpatient option.   Discussed PCN allergy and [facility], no recent Amoxicillin or Augmentin rx in the last 2 years, told patient to call Dr. Alonza Smoker office and see if they have this documented and if she did tolerated Amoxicillin I would say to switch her to Augmentin for diverticulitis   For now refills on Cipro and Flagyl for 1 more week have been done by the ED since she only has 1 day left   Discussed seeing me in like 2 weeks to see how she is doing  Potential for repeat CT with at least oral contrast then to see if we can see this stump again  If continues to have issues/ pain, may need to be further evaluated by GI as she does have a functional pain diagnosis in the past for chronic abdominal pain   All questions were answered to the satisfaction of the patient and family.    Lucretia Roers 11/13/2023, 8:06 PM

## 2023-11-13 NOTE — ED Notes (Signed)
Dr. Bridges at bedside 

## 2023-11-13 NOTE — Discharge Instructions (Addendum)
 Please follow-up closely with your primary care doctor and with general surgery on an outpatient basis for reevaluation.  Return to emergency department immediately for any new or worsening symptoms.

## 2023-11-13 NOTE — ED Notes (Signed)
 ED Provider- PA Rigney at bedside.

## 2023-11-13 NOTE — ED Notes (Signed)
 ED Provider at bedside.

## 2023-11-13 NOTE — ED Triage Notes (Signed)
 Pt c/o abd pain . Pt went to Dr on the 1st and they prescribed a medication that will be done tomorrow. Pt states it has been worse since the 9th and unable to remember meds. Pt was told she has inflamed intestine.

## 2023-11-14 ENCOUNTER — Telehealth: Payer: Self-pay | Admitting: *Deleted

## 2023-11-14 NOTE — Telephone Encounter (Addendum)
 Received call from Toniann Fail, receptionist with Carylon Perches, MD office [phone]~ telephone.   Toniann Fail reports that patient called to inquire if she would be able to tolerate Augmentin. Per report, Dr. Ouida Sills stated "absolutely not".  Dr. Ouida Sills reports that patient has penicillin allergy (itching) and he has not prescribed Augmentin or Amoxicillin to her as such. States that he does not feel that she should be given Augmentin/ Amoxicillin due to her allergy. There is no documented prescription in patient chart.

## 2023-11-28 ENCOUNTER — Ambulatory Visit: Admitting: General Surgery

## 2023-11-28 ENCOUNTER — Encounter: Payer: Self-pay | Admitting: General Surgery

## 2023-11-28 VITALS — BP 122/75 | HR 84 | Temp 98.2°F | Resp 18 | Ht 65.0 in | Wt 192.0 lb

## 2023-11-28 DIAGNOSIS — Z91041 Radiographic dye allergy status: Secondary | ICD-10-CM

## 2023-11-28 DIAGNOSIS — K389 Disease of appendix, unspecified: Secondary | ICD-10-CM | POA: Diagnosis not present

## 2023-11-28 MED ORDER — PREDNISONE 50 MG PO TABS: 50.0000 mg | ORAL_TABLET | Freq: Four times a day (QID) | ORAL | 0 refills | Status: AC

## 2023-11-28 MED ORDER — DIPHENHYDRAMINE HCL 50 MG PO TABS: 50.0000 mg | ORAL_TABLET | Freq: Once | ORAL | 0 refills | Status: DC

## 2023-11-28 NOTE — Progress Notes (Unsigned)
 [facility] Note   HPI:  73 y.o. Female presents to clinic for follow-up evaluation of her lower abdominal pain and stump appendix seen on CT. Patient reports resolved pain and better Bms. She is eating and having no issues. She completed the cipro  flagyl  refill yesterday.  Review of Systems:  Resolved pain  All other review of systems: otherwise negative   Vital Signs:  BP 122/75   Pulse 84   Temp 98.2 F (36.8 C) (Other (Comment))   Resp 18   Ht 5\' 5"  (1.651 m)   Wt 192 lb (87.1 kg)   SpO2 91%   BMI 31.95 kg/m    Physical Exam:  Physical Exam HENT:     Head: Normocephalic.  Cardiovascular:     Rate and Rhythm: Normal rate.  Pulmonary:     Effort: Pulmonary effort is normal.  Abdominal:     General: There is no distension.     Palpations: Abdomen is soft.     Tenderness: There is no abdominal tenderness.      Assessment:  73 y.o. yo Female with prior history of appendectomy for longterm chronic abdominal pain, who had CT for lower/left lower abdominal pain and identification of possible appendiceal stump. Discussed reimaging since prior images has not seen to be sure what we are seeing. If no changes and no concerns, I would say that she would not need this excised given her surgery history and pain history. If she has any concerning features like size /dilation she is older and appendiceal cancer could be on the differential. She had colonoscopy and EGD 2023.   Plan:  Will order a CT scan and get that with contrast. Will have you premedication with prednisone  and benadryl  to get the contrast dye.  [facility] for the [facility] and the office will tell you when to take the medication based on the time of the CT.  Will call you with the results.   All of the above recommendations were discussed with the patient.  Deena Farrier, MD [facility] [address] [place], [place]  [address] [phone] (office)

## 2023-11-28 NOTE — Patient Instructions (Addendum)
 Will order a CT scan and get that with contrast. Will have you premedication with prednisone  and benadryl  to get the contrast dye.  Your medications for the [facility] and the office will tell you when to take the medication based on the time of the CT.  Will call you with the results.

## 2023-11-29 ENCOUNTER — Telehealth: Payer: Self-pay | Admitting: *Deleted

## 2023-11-29 NOTE — Telephone Encounter (Signed)
 Call placed to patient to discuss appointment information.   LMTRC.

## 2023-11-29 NOTE — Telephone Encounter (Signed)
 CT ABDOMEN PELVIS W CONTRAST  [facility] 01/10/2024 @ 4 PM Arrive at 2 PM to begin oral contrast.   Pre- Contrast Treatment: Prednisone  50 MG~ Take 13 hours, 7 hours, and 1 hour prior to scan (3 AM, 9 AM, 3 PM) Benadryl  50 MG~ Take 1 hour prior to scan (3 PM)  Of note, patient reports that she is unable to take Prednisone  due to contraindication with PUD. States that steroid may irritate bleeding ulcers.   Upon chart review, patient has taken pre- contrast treatment in 2022 for cardiac catheterization with no issues. Dr. Collene Dawson and Dr. Mordechai April discussed and agree that pre- contrast treatment is acceptable risk.   Call placed to patient and advised of provider recommendations. Agreeable to plan.

## 2023-12-02 NOTE — Telephone Encounter (Signed)
Patient returned call and made aware.   Agreeable to plan.  

## 2023-12-02 NOTE — Telephone Encounter (Signed)
 Call placed to patient to discuss appointment information.   LMTRC.

## 2024-01-10 ENCOUNTER — Ambulatory Visit (HOSPITAL_COMMUNITY): Admission: RE | Admit: 2024-01-10 | Source: Ambulatory Visit

## 2024-03-16 ENCOUNTER — Encounter (HOSPITAL_COMMUNITY): Payer: Self-pay | Admitting: Family Medicine

## 2024-03-16 ENCOUNTER — Emergency Department (HOSPITAL_COMMUNITY)

## 2024-03-16 ENCOUNTER — Observation Stay (HOSPITAL_COMMUNITY)
Admission: EM | Admit: 2024-03-16 | Discharge: 2024-03-17 | Disposition: A | Discharge status: Home / Self Care | Source: Ambulatory Visit | Attending: Family Medicine | Admitting: Family Medicine

## 2024-03-16 ENCOUNTER — Other Ambulatory Visit: Payer: Self-pay

## 2024-03-16 DIAGNOSIS — Z91040 Latex allergy status: Secondary | ICD-10-CM | POA: Insufficient documentation

## 2024-03-16 DIAGNOSIS — I4891 Unspecified atrial fibrillation: Principal | ICD-10-CM | POA: Diagnosis present

## 2024-03-16 DIAGNOSIS — G8929 Other chronic pain: Secondary | ICD-10-CM | POA: Diagnosis not present

## 2024-03-16 DIAGNOSIS — K219 Gastro-esophageal reflux disease without esophagitis: Secondary | ICD-10-CM | POA: Diagnosis not present

## 2024-03-16 DIAGNOSIS — I2510 Atherosclerotic heart disease of native coronary artery without angina pectoris: Secondary | ICD-10-CM | POA: Diagnosis present

## 2024-03-16 DIAGNOSIS — F1120 Opioid dependence, uncomplicated: Secondary | ICD-10-CM | POA: Diagnosis present

## 2024-03-16 DIAGNOSIS — E785 Hyperlipidemia, unspecified: Secondary | ICD-10-CM | POA: Diagnosis not present

## 2024-03-16 DIAGNOSIS — F11229 Opioid dependence with intoxication, unspecified: Secondary | ICD-10-CM | POA: Diagnosis not present

## 2024-03-16 DIAGNOSIS — J449 Chronic obstructive pulmonary disease, unspecified: Secondary | ICD-10-CM | POA: Diagnosis not present

## 2024-03-16 DIAGNOSIS — I480 Paroxysmal atrial fibrillation: Secondary | ICD-10-CM

## 2024-03-16 DIAGNOSIS — F32A Depression, unspecified: Secondary | ICD-10-CM | POA: Diagnosis present

## 2024-03-16 DIAGNOSIS — K5903 Drug induced constipation: Secondary | ICD-10-CM | POA: Diagnosis not present

## 2024-03-16 DIAGNOSIS — E039 Hypothyroidism, unspecified: Secondary | ICD-10-CM | POA: Diagnosis present

## 2024-03-16 DIAGNOSIS — G4700 Insomnia, unspecified: Secondary | ICD-10-CM | POA: Diagnosis not present

## 2024-03-16 DIAGNOSIS — M545 Low back pain, unspecified: Secondary | ICD-10-CM | POA: Diagnosis not present

## 2024-03-16 DIAGNOSIS — I4892 Unspecified atrial flutter: Secondary | ICD-10-CM | POA: Diagnosis not present

## 2024-03-16 DIAGNOSIS — I10 Essential (primary) hypertension: Secondary | ICD-10-CM | POA: Diagnosis not present

## 2024-03-16 DIAGNOSIS — T402X5A Adverse effect of other opioids, initial encounter: Secondary | ICD-10-CM | POA: Diagnosis present

## 2024-03-16 DIAGNOSIS — R0602 Shortness of breath: Secondary | ICD-10-CM | POA: Diagnosis present

## 2024-03-16 LAB — COMPREHENSIVE METABOLIC PANEL WITH GFR
ALT: 22 U/L (ref 0–44)
AST: 28 U/L (ref 15–41)
Albumin: 3.7 g/dL (ref 3.5–5.0)
Alkaline Phosphatase: 91 U/L (ref 38–126)
Anion gap: 11 (ref 5–15)
BUN: 18 mg/dL (ref 8–23)
CO2: 24 mmol/L (ref 22–32)
Calcium: 9.5 mg/dL (ref 8.9–10.3)
Chloride: 105 mmol/L (ref 98–111)
Creatinine, Ser: 0.76 mg/dL (ref 0.44–1.00)
GFR, Estimated: >60 mL/min (ref >60)
Glucose, Bld: 102 mg/dL — ABNORMAL HIGH (ref 70–99)
Potassium: 3.7 mmol/L (ref 3.5–5.1)
Sodium: 140 mmol/L (ref 135–145)
Total Bilirubin: 0.8 mg/dL (ref 0.0–1.2)
Total Protein: 7 g/dL (ref 6.5–8.1)

## 2024-03-16 LAB — CBC WITH DIFFERENTIAL/PLATELET
Abs Immature Granulocytes: 0.01 K/uL (ref 0.00–0.07)
Basophils Absolute: 0.1 K/uL (ref 0.0–0.1)
Basophils Relative: 1 %
Eosinophils Absolute: 0.4 K/uL (ref 0.0–0.5)
Eosinophils Relative: 4 %
HCT: 45.6 % (ref 36.0–46.0)
Hemoglobin: 15.6 g/dL — ABNORMAL HIGH (ref 12.0–15.0)
Immature Granulocytes: 0 %
Lymphocytes Relative: 30 %
Lymphs Abs: 2.7 K/uL (ref 0.7–4.0)
MCH: 29.7 pg (ref 26.0–34.0)
MCHC: 34.2 g/dL (ref 30.0–36.0)
MCV: 86.7 fL (ref 80.0–100.0)
Monocytes Absolute: 1 K/uL (ref 0.1–1.0)
Monocytes Relative: 11 %
Neutro Abs: 4.8 K/uL (ref 1.7–7.7)
Neutrophils Relative %: 54 %
Platelets: 256 K/uL (ref 150–400)
RBC: 5.26 MIL/uL — ABNORMAL HIGH (ref 3.87–5.11)
RDW: 13.7 % (ref 11.5–15.5)
WBC: 8.9 K/uL (ref 4.0–10.5)
nRBC: 0 % (ref 0.0–0.2)

## 2024-03-16 LAB — MRSA NEXT GEN BY PCR, NASAL: MRSA by PCR Next Gen: NOT DETECTED

## 2024-03-16 LAB — MAGNESIUM: Magnesium: 1.7 mg/dL (ref 1.7–2.4)

## 2024-03-16 LAB — TROPONIN I (HIGH SENSITIVITY)
Troponin I (High Sensitivity): 8 ng/L (ref <18)
Troponin I (High Sensitivity): 7 ng/L (ref <18)

## 2024-03-16 MED ORDER — ASPIRIN 81 MG PO TBEC
81.0000 mg | DELAYED_RELEASE_TABLET | Freq: Every day | ORAL | Status: DC
  Administered 2024-03-16 (×2): 81 mg via ORAL
  Filled 2024-03-16: qty 1

## 2024-03-16 MED ORDER — METOPROLOL TARTRATE 25 MG PO TABS
37.5000 mg | ORAL_TABLET | Freq: Four times a day (QID) | ORAL | Status: DC
  Administered 2024-03-17 (×4): 37.5 mg via ORAL
  Filled 2024-03-16 (×3): qty 2

## 2024-03-16 MED ORDER — SODIUM CHLORIDE 0.9 % IV BOLUS
500.0000 mL | Freq: Once | INTRAVENOUS | Status: AC
  Administered 2024-03-16 (×2): 500 mL via INTRAVENOUS

## 2024-03-16 MED ORDER — CYCLOBENZAPRINE HCL 10 MG PO TABS
10.0000 mg | ORAL_TABLET | Freq: Three times a day (TID) | ORAL | Status: DC | PRN
  Administered 2024-03-16 (×2): 10 mg via ORAL
  Filled 2024-03-16: qty 1

## 2024-03-16 MED ORDER — CHLORHEXIDINE GLUCONATE CLOTH 2 % EX PADS
6.0000 | MEDICATED_PAD | Freq: Every day | CUTANEOUS | Status: DC
  Administered 2024-03-17 (×2): 6 via TOPICAL

## 2024-03-16 MED ORDER — SODIUM CHLORIDE 0.9 % IV SOLN: 250.0000 mL | INTRAVENOUS | Status: DC | PRN

## 2024-03-16 MED ORDER — POTASSIUM CHLORIDE CRYS ER 20 MEQ PO TBCR
10.0000 meq | EXTENDED_RELEASE_TABLET | Freq: Two times a day (BID) | ORAL | Status: DC
  Administered 2024-03-16 (×2): 10 meq via ORAL
  Filled 2024-03-16: qty 1

## 2024-03-16 MED ORDER — CHLORTHALIDONE 25 MG PO TABS
25.0000 mg | ORAL_TABLET | Freq: Every day | ORAL | Status: DC
  Administered 2024-03-17 (×2): 25 mg via ORAL
  Filled 2024-03-16 (×2): qty 1

## 2024-03-16 MED ORDER — ZOLPIDEM TARTRATE 5 MG PO TABS
5.0000 mg | ORAL_TABLET | Freq: Every day | ORAL | Status: DC
  Administered 2024-03-16 (×2): 5 mg via ORAL
  Filled 2024-03-16: qty 1

## 2024-03-16 MED ORDER — NITROGLYCERIN 0.4 MG SL SUBL: 0.4000 mg | SUBLINGUAL_TABLET | SUBLINGUAL | Status: DC | PRN

## 2024-03-16 MED ORDER — METOPROLOL TARTRATE 5 MG/5ML IV SOLN: 5.0000 mg | Freq: Four times a day (QID) | INTRAVENOUS | Status: DC | PRN

## 2024-03-16 MED ORDER — MORPHINE SULFATE ER 15 MG PO TBCR
30.0000 mg | EXTENDED_RELEASE_TABLET | Freq: Two times a day (BID) | ORAL | Status: DC
  Administered 2024-03-16 – 2024-03-17 (×4): 30 mg via ORAL
  Filled 2024-03-16 (×3): qty 2

## 2024-03-16 MED ORDER — LUBIPROSTONE 24 MCG PO CAPS
24.0000 ug | ORAL_CAPSULE | Freq: Two times a day (BID) | ORAL | Status: DC
  Administered 2024-03-16 – 2024-03-17 (×4): 24 ug via ORAL
  Filled 2024-03-16 (×2): qty 1

## 2024-03-16 MED ORDER — ALBUTEROL SULFATE (2.5 MG/3ML) 0.083% IN NEBU: 3.0000 mL | INHALATION_SOLUTION | RESPIRATORY_TRACT | Status: DC | PRN

## 2024-03-16 MED ORDER — METOPROLOL TARTRATE 50 MG PO TABS: 75.0000 mg | ORAL_TABLET | Freq: Two times a day (BID) | ORAL | Status: DC

## 2024-03-16 MED ORDER — OXYCODONE-ACETAMINOPHEN 10-325 MG PO TABS: 1.0000 | ORAL_TABLET | Freq: Two times a day (BID) | ORAL | Status: DC | PRN

## 2024-03-16 MED ORDER — HEPARIN BOLUS VIA INFUSION
4000.0000 [IU] | Freq: Once | INTRAVENOUS | Status: AC
  Administered 2024-03-16 (×2): 4000 [IU] via INTRAVENOUS

## 2024-03-16 MED ORDER — DICLOFENAC SODIUM 1 % EX GEL: 1.0000 | CUTANEOUS | Status: DC | PRN

## 2024-03-16 MED ORDER — POTASSIUM CHLORIDE CRYS ER 20 MEQ PO TBCR
40.0000 meq | EXTENDED_RELEASE_TABLET | Freq: Once | ORAL | Status: AC
  Administered 2024-03-16 (×2): 40 meq via ORAL
  Filled 2024-03-16: qty 2

## 2024-03-16 MED ORDER — SODIUM CHLORIDE 0.9% FLUSH: 3.0000 mL | INTRAVENOUS | Status: DC | PRN

## 2024-03-16 MED ORDER — METOPROLOL TARTRATE 50 MG PO TABS: 50.0000 mg | ORAL_TABLET | Freq: Two times a day (BID) | ORAL | Status: DC

## 2024-03-16 MED ORDER — LEVOTHYROXINE SODIUM 137 MCG PO TABS
137.0000 ug | ORAL_TABLET | Freq: Every day | ORAL | Status: DC
  Administered 2024-03-17 (×2): 137 ug via ORAL
  Filled 2024-03-16 (×3): qty 1

## 2024-03-16 MED ORDER — DICYCLOMINE HCL 10 MG PO CAPS
20.0000 mg | ORAL_CAPSULE | Freq: Two times a day (BID) | ORAL | Status: DC
  Administered 2024-03-16 – 2024-03-17 (×4): 20 mg via ORAL
  Filled 2024-03-16 (×2): qty 2

## 2024-03-16 MED ORDER — SODIUM CHLORIDE 0.9% FLUSH
3.0000 mL | Freq: Two times a day (BID) | INTRAVENOUS | Status: DC
  Administered 2024-03-16 – 2024-03-17 (×4): 3 mL via INTRAVENOUS

## 2024-03-16 MED ORDER — ONDANSETRON HCL 4 MG PO TABS: 4.0000 mg | ORAL_TABLET | Freq: Four times a day (QID) | ORAL | Status: DC | PRN

## 2024-03-16 MED ORDER — OXYCODONE HCL 5 MG PO TABS
5.0000 mg | ORAL_TABLET | Freq: Two times a day (BID) | ORAL | Status: DC | PRN
  Administered 2024-03-16 (×2): 5 mg via ORAL
  Filled 2024-03-16: qty 1

## 2024-03-16 MED ORDER — PANTOPRAZOLE SODIUM 40 MG PO TBEC
80.0000 mg | DELAYED_RELEASE_TABLET | Freq: Every day | ORAL | Status: DC
  Administered 2024-03-17 (×2): 80 mg via ORAL
  Filled 2024-03-16: qty 2

## 2024-03-16 MED ORDER — POLYETHYLENE GLYCOL 3350 17 G PO PACK: 17.0000 g | PACK | Freq: Every day | ORAL | Status: DC | PRN

## 2024-03-16 MED ORDER — DILTIAZEM LOAD VIA INFUSION
20.0000 mg | Freq: Once | INTRAVENOUS | Status: AC
  Administered 2024-03-16 (×2): 20 mg via INTRAVENOUS
  Filled 2024-03-16: qty 20

## 2024-03-16 MED ORDER — SIMVASTATIN 20 MG PO TABS
20.0000 mg | ORAL_TABLET | Freq: Every day | ORAL | Status: DC
  Administered 2024-03-16 (×2): 20 mg via ORAL
  Filled 2024-03-16: qty 1

## 2024-03-16 MED ORDER — ONDANSETRON HCL 4 MG/2ML IJ SOLN
4.0000 mg | Freq: Four times a day (QID) | INTRAMUSCULAR | Status: DC | PRN
Start: 2024-03-16 — End: 2024-03-17

## 2024-03-16 MED ORDER — METOPROLOL TARTRATE 25 MG PO TABS
37.5000 mg | ORAL_TABLET | Freq: Four times a day (QID) | ORAL | Status: DC
  Administered 2024-03-16 (×2): 37.5 mg via ORAL
  Filled 2024-03-16: qty 2

## 2024-03-16 MED ORDER — OXYCODONE-ACETAMINOPHEN 5-325 MG PO TABS
1.0000 | ORAL_TABLET | Freq: Two times a day (BID) | ORAL | Status: DC | PRN
  Administered 2024-03-16 (×2): 1 via ORAL
  Filled 2024-03-16: qty 1

## 2024-03-16 MED ORDER — DILTIAZEM HCL-DEXTROSE 125-5 MG/125ML-% IV SOLN (PREMIX)
5.0000 mg/h | INTRAVENOUS | Status: DC
  Administered 2024-03-16 (×2): 5 mg/h via INTRAVENOUS
  Filled 2024-03-16: qty 125

## 2024-03-16 MED ORDER — MORPHINE SULFATE 15 MG PO TABS
15.0000 mg | ORAL_TABLET | Freq: Three times a day (TID) | ORAL | Status: DC | PRN
  Administered 2024-03-17 (×2): 15 mg via ORAL
  Filled 2024-03-16: qty 1

## 2024-03-16 MED ORDER — HEPARIN (PORCINE) 25000 UT/250ML-% IV SOLN
950.0000 [IU]/h | INTRAVENOUS | Status: DC
  Administered 2024-03-16 (×2): 950 [IU]/h via INTRAVENOUS
  Filled 2024-03-16: qty 250

## 2024-03-16 MED ORDER — LOSARTAN POTASSIUM 50 MG PO TABS: 100.0000 mg | ORAL_TABLET | Freq: Every day | ORAL | Status: DC

## 2024-03-16 NOTE — Assessment & Plan Note (Signed)
 Continue MBM

## 2024-03-16 NOTE — Hospital Course (Signed)
 Patient is a 73 year old with history of HTN, HLD, hypothyroidism, chronic back pain on multiple opioids, GERD, CAD who was admitted with new onset A-fib with RVR.  She notes increasing shortness of breath and palpitations over the last couple of days.  She checked her home O2 sats and they were noted to be in the 50s.  When she arrived to the ED she had a heart rate in the 130s to 140s.  Cardiology consult was obtained.  The patient was started on Cardizem  and heparin  drips we are asked to admit.

## 2024-03-16 NOTE — Assessment & Plan Note (Signed)
 Continue Amitiza 

## 2024-03-16 NOTE — Assessment & Plan Note (Signed)
 Continue PPI Continue Bentyl 

## 2024-03-16 NOTE — Assessment & Plan Note (Signed)
 Versus a flutter Started on Cardizem  [place] score is 3, will need anticoagulation On heparin  drip to transition to Eliquis  in the morning Spontaneously converted back to sinus rhythm with frequent PVCs Continue telemetry monitoring

## 2024-03-16 NOTE — Assessment & Plan Note (Signed)
 Continue home opioids These include MS Contin  30 twice daily MS IR 15 3 times daily as needed Oxycodone  tens twice daily as needed The patient does have some mild hypoxia and should give consideration for decreasing her opioids as an outpatient

## 2024-03-16 NOTE — Assessment & Plan Note (Signed)
 Check TSH   Continue levothyroxine

## 2024-03-16 NOTE — ED Triage Notes (Signed)
 Pt comes in for SOB and 'if feels like the oxygen in my body is low and I have not strength'. Pt does have labored breathing with exertion Pt is A&Ox4.

## 2024-03-16 NOTE — Assessment & Plan Note (Signed)
 Continue beta-blocker Continue statin Nitroglycerin  as needed  negative tropes

## 2024-03-16 NOTE — TOC CM/SW Note (Signed)
 Transition of Care [facility]) - Inpatient Brief Assessment   Patient Details  Name: Selena [name] MRN: [id number] Date of Birth: 09/14/1950  Transition of Care [facility]) CM/SW Contact:    Noreen KATHEE Pinal, LCSWA Phone Number: 03/16/2024, 3:31 PM   Clinical Narrative:    Transition of Care Department [facility]) has reviewed patient and no TOC needs have been identified at this time. We will continue to monitor patient advancement through interdisciplinary progression rounds. If new patient transition needs arise, please place a TOC consult.    Transition of Care Asessment: Insurance and Status: Insurance coverage has been reviewed Patient has primary care physician: Yes Home environment has been reviewed: Single Family Home Prior level of function:: Independent Prior/Current Home Services: No current home services Social Drivers of Health Review: SDOH reviewed no interventions necessary Readmission risk has been reviewed: Yes Transition of care needs: no transition of care needs at this time

## 2024-03-16 NOTE — Consult Note (Signed)
 Cardiology Consultation   Patient ID: [name] MRN: [id number]; DOB: 10-Jul-1951  Admit date: 03/16/2024 Date of Consult: 03/16/2024  PCP:  Sheryle Carwin, MD   [facility] Providers Cardiologist:  Jayson Sierras, MD   {    Patient Profile: Selena [name] is a 73 y.o. female with a hx of PVCs, HTN, HLD who is being seen 03/16/2024 for the evaluation of SOB  at the request of Dr Chrissie.  History of Present Illness: Selena [name] 73 yo female history of palpitations/PVCs, HTN, HLD, GERD, presents with SOB. In ER found to be in aflutter with RVR, new diagnosis for patient. Cardiology consulted to help manage. Reports ongoing SOB/DOE x 2 weeks. Has chronic palpitations which have been overall unchanged.     WBC 8.9 Hgb 15.6 Plt 256 K 3.7 Cr 0.76 BUN 18  Trop 8--> EKG aflutter with RVR CXR mild left basilar atelectasis vs infiltrate  10/2020 cath: normal coronaries.  07/2017 echo: LVEF 55-60%, no WMAs, grade I dd   Past Medical History:  Diagnosis Date   Abnormal findings on imaging of biliary tract 1997-2000   CBD 21mm, Dr Antonio biliary/pancreatic manometry, EUS Dr. Teressa 09/18/07 dilated but otherwise normal extrahepatic duct, no masses   Anemia    Arthritis    Asthma    Bursitis    Cervical ca (HCC) 1984   partial hysterectomy   Chronic abdominal pain    Functional, seen @[facility] , extensive WU here benign    Chronic back pain    On methadone    Constipation    COPD (chronic obstructive pulmonary disease) (HCC)    Degenerative lumbar spinal stenosis 10/2022   Depression    w/ menopause   Dyspnea    with exertion   Essential hypertension    GERD (gastroesophageal reflux disease)    H/O hiatal hernia    Headache    Hx of migraines   Hemorrhoids    Hyperlipemia    Hypothyroidism    MI (myocardial infarction) (HCC) 2000   Details not clear   Opioid dependence (HCC)    morphine    Osteoarthritis    Palpitations    PVCs   Pneumonia    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 Leslee sec to tortuous colon, followed by [facility] & flex sig normal   Skin cancer    Nose   TIA (transient ischemic attack) 1983    Past Surgical History:  Procedure Laterality Date   ABDOMINAL EXPOSURE N/A 10/05/2019   Procedure: ABDOMINAL EXPOSURE;  Surgeon: Gretta Lonni PARAS, MD;  Location: [facility] OR;  Service: Vascular;  Laterality: N/A;   ABDOMINAL HYSTERECTOMY     ANTERIOR CERVICAL DECOMP/DISCECTOMY FUSION N/A 04/15/2023   Procedure: Anterior Cervical Decompression Fusion  Cervical five-Cervical six - Cervical six-Cervical seven;  Surgeon: Onetha Kuba, MD;  Location: [facility] OR;  Service: Neurosurgery;  Laterality: N/A;   ANTERIOR LUMBAR FUSION N/A 10/05/2019   Procedure: Anterior Lumbar Interbody Fusion - Lumbar Five-Sacral One;  Surgeon: Onetha Kuba, MD;  Location: [facility] OR;  Service: Neurosurgery;  Laterality: N/A;  Anterior Lumbar Interbody Fusion - Lumbar Five-Sacral One   APPENDECTOMY     arthroscopic knee     BACK SURGERY     BIOPSY  12/18/2021   Procedure: BIOPSY;  Surgeon: Cindie Carlin POUR, DO;  Location: [facility];  Service: Endoscopy;;   CARDIAC CATHETERIZATION  2005   CATARACT EXTRACTION  W/PHACO Right 09/02/2014   Procedure: CATARACT EXTRACTION PHACO AND INTRAOCULAR LENS PLACEMENT RIGHT EYE;  Surgeon: Cherene Mania, MD;  Location: [facility] ORS;  Service: Ophthalmology;  Laterality: Right;  CDE:8.96   CATARACT EXTRACTION W/PHACO Left 09/13/2014   Procedure: CATARACT EXTRACTION PHACO AND INTRAOCULAR LENS PLACEMENT LEFT EYE;  Surgeon: Cherene Mania, MD;  Location: [facility] ORS;  Service: Ophthalmology;  Laterality: Left;  CDE 6.20   CHOLECYSTECTOMY     COLONOSCOPY WITH PROPOFOL  N/A 12/18/2021   Procedure: COLONOSCOPY WITH PROPOFOL ;  Surgeon: Cindie Carlin POUR, DO;  Location: [facility];  Service: Endoscopy;  Laterality: N/A;  12:15pm   ESOPHAGOGASTRODUODENOSCOPY (EGD)  WITH PROPOFOL  N/A 12/18/2021   Procedure: ESOPHAGOGASTRODUODENOSCOPY (EGD) WITH PROPOFOL ;  Surgeon: Cindie Carlin POUR, DO;  Location: [facility];  Service: Endoscopy;  Laterality: N/A;   EYE SURGERY Bilateral    cataract removal   INGUINAL HERNIA REPAIR     left   JOINT REPLACEMENT Bilateral    knees   LEFT HEART CATH AND CORONARY ANGIOGRAPHY N/A 10/19/2020   Procedure: LEFT HEART CATH AND CORONARY ANGIOGRAPHY;  Surgeon: Claudene Victory ORN, MD;  Location: [facility] INV[facility];  Service: Cardiovascular;  Laterality: N/A;   PARTIAL HYSTERECTOMY     right arm     nerve   TONSILLECTOMY     TOTAL KNEE ARTHROPLASTY  06/20/2012   Procedure: TOTAL KNEE ARTHROPLASTY;  Surgeon: ORN JONETTA Shari Mickey., MD;  Location: [facility] OR;  Service: Orthopedics;  Laterality: Right;   TOTAL KNEE ARTHROPLASTY Left 04/02/2014   Procedure: TOTAL KNEE ARTHROPLASTY;  Surgeon: ORN JONETTA Shari Mickey., MD;  Location: [facility] OR;  Service: Orthopedics;  Laterality: Left;      Scheduled Meds:  Continuous Infusions:  diltiazem  (CARDIZEM ) infusion 5 mg/hr (03/16/24 1327)   PRN Meds:   Allergies:    Allergies  Allergen Reactions   Iohexol  Shortness Of Breath and Swelling     Desc: Pt states she had Cardia Cath around 2000/ experienced SOB and swelling of upper body during cath procedure/ tsf    Nubain [Nalbuphine Hcl] Shortness Of Breath and Other (See Comments)    ??  DOSE RELATED  ??   Penicillins Itching, Rash and Other (See Comments)   Iodinated Contrast Media Swelling   Latex Rash    Social History:   Social History   Socioeconomic History   Marital status: Married    Spouse name: Not on file   Number of children: 1   Years of education: Not on file   Highest education level: Not on file  Occupational History   Occupation: Hospital doctor: [employer]  Tobacco Use   Smoking status: Former    Current packs/day: 0.00    Average packs/day: 1 pack/day for 22.4 years (22.4 ttl pk-yrs)    Types: Cigarettes     Start date: 08/06/1981    Quit date: 01/05/2004    Years since quitting: 20.2   Smokeless tobacco: Never   Tobacco comments:    Quit in 2005  Vaping Use   Vaping status: Never Used  Substance and Sexual Activity   Alcohol use: No    Alcohol/week: 0.0 standard drinks of alcohol   Drug use: No   Sexual activity: Not Currently    Birth control/protection: Surgical    Comment: hysterectomy  Other Topics Concern   Not on file  Social History Narrative   Not on file   Social Drivers of Health   Financial Resource Strain: Not on  file  Food Insecurity: Not on file  Transportation Needs: Not on file  Physical Activity: Not on file  Stress: Not on file  Social Connections: Not on file  Intimate Partner Violence: Not on file    Family History:    Family History  Problem Relation Age of Onset   Ulcers Brother    Heart attack Brother    Ulcers Sister    Thyroid  disease Daughter    Hypertension Daughter    Other Daughter        nervous   Hypertension Sister    Heart disease Sister    Thyroid  disease Sister    Hypertension Sister    Heart disease Sister    Thyroid  disease Sister    Heart attack Brother    Cancer Brother        prostate     ROS:  Please see the history of present illness.   All other ROS reviewed and negative.     Physical Exam/Data: Vitals:   03/16/24 1400 03/16/24 1405 03/16/24 1415 03/16/24 1430  BP:  113/76 118/70 106/63  Pulse:  73 (!) 59 66  Resp:  19 19 17   Temp:      TempSrc:      SpO2: 93% 93% 97% 96%  Weight:      Height:       No intake or output data in the 24 hours ending 03/16/24 1500    03/16/2024   12:16 PM 11/28/2023    3:01 PM 11/13/2023   12:31 PM  Last 3 Weights  Weight (lbs) 195 lb 192 lb 180 lb  Weight (kg) 88.451 kg 87.091 kg 81.647 kg     Body mass index is 32.45 kg/m.  General:  Well nourished, well developed, in no acute distress HEENT: normal Neck: no JVD Vascular: No carotid bruits; Distal pulses 2+  bilaterally Cardiac:  normal S1, S2; RRR; no murmur  Lungs:  clear to auscultation bilaterally, no wheezing, rhonchi or rales  Abd: soft, nontender, no hepatomegaly  Ext: no edema Musculoskeletal:  No deformities, BUE and BLE strength normal and equal Skin: warm and dry  Neuro:  CNs 2-12 intact, no focal abnormalities noted Psych:  Normal affect     Laboratory Data: High Sensitivity Troponin:   Recent Labs  Lab 03/16/24 1238  TROPONINIHS 8     Chemistry Recent Labs  Lab 03/16/24 1238  NA 140  K 3.7  CL 105  CO2 24  GLUCOSE 102*  BUN 18  CREATININE 0.76  CALCIUM 9.5  GFRNONAA >60  ANIONGAP 11    Recent Labs  Lab 03/16/24 1238  PROT 7.0  ALBUMIN  3.7  AST 28  ALT 22  ALKPHOS 91  BILITOT 0.8   Lipids No results for input(s): CHOL, TRIG, HDL, LABVLDL, LDLCALC, CHOLHDL in the last 168 hours.  Hematology Recent Labs  Lab 03/16/24 1238  WBC 8.9  RBC 5.26*  HGB 15.6*  HCT 45.6  MCV 86.7  MCH 29.7  MCHC 34.2  RDW 13.7  PLT 256   Thyroid  No results for input(s): TSH, FREET4 in the last 168 hours.  BNPNo results for input(s): BNP, PROBNP in the last 168 hours.  DDimer No results for input(s): DDIMER in the last 168 hours.  Radiology/Studies:  DG Chest Portable 1 View Result Date: 03/16/2024 CLINICAL DATA:  Shortness of breath. EXAM: PORTABLE CHEST 1 VIEW COMPARISON:  October 01, 2023 FINDINGS: The cardiac silhouette is mildly enlarged. This may be technical in origin. Mild,  diffuse, chronic appearing increased interstitial lung markings are seen. Mild atelectasis and/or infiltrate is present within the left lung base. No pleural effusion or pneumothorax is identified. Postoperative changes are seen within the lower lumbar spine with multilevel degenerative changes present throughout the thoracic spine. IMPRESSION: Mild left basilar atelectasis and/or infiltrate. Electronically Signed   By: Suzen Dials M.D.   On: 03/16/2024 13:50      Assessment and Plan: 1.Aflutter - new diagnosis this admission, presented with aflutter with RVR - received IV diltiazem  20mg  x 1, now on drip at 5mg /hr. Bp 110s/60s - already back in NSR though with some ongoing ectopy. On lopressor  50mg  bid at home, change to lopressor  37.5mg  q 6 hours, see if can wean dilt gtt off.  - CHADS2Vasc score is at least 3(HTN, age x 1, gender), started on hep initially likely can transition to eliquis  in AM.  - check echo - with soft bp's on dilt gtt would hold home losartan .      For questions or updates, please contact [facility] Please consult [email or web address] for contact info under    Signed, Alvan Carrier, MD  03/16/2024 3:00 PM

## 2024-03-16 NOTE — Assessment & Plan Note (Addendum)
 Continue her home opioids  Continue muscle relaxer

## 2024-03-16 NOTE — Assessment & Plan Note (Signed)
 Continue simvastatin .

## 2024-03-16 NOTE — ED Provider Notes (Signed)
 [place] EMERGENCY DEPARTMENT AT [facility] Provider Note   CSN: [id number] Arrival date & time: 03/16/24  1154     Patient presents with: Shortness of Breath and Weakness   Selena [name] is a 73 y.o. female.   Pt is a 73 yo female with pmhx significant for depression, TIA(? In her 30s), HTN, HLD, hypothyroidism, TIA, GERD, PUD, CAD, COPD, OA, cervical cancer s/p hyst, skin cancer and PVCs.  Pt said she's been SOB for a few days.  She had a home pulse ox which was reading in the 50s, so she came in.  She has felt like her HR has been elevated at home.         Prior to Admission medications   Medication Sig Start Date End Date Taking? Authorizing Provider  albuterol  (PROVENTIL  HFA;VENTOLIN  HFA) 108 (90 Base) MCG/ACT inhaler Inhale 2 puffs into the lungs every 4 (four) hours as needed for wheezing or shortness of breath. Patient taking differently: Inhale 2 puffs into the lungs as needed for wheezing or shortness of breath. 09/22/16   Joyice Sauer, DO  aspirin  EC 81 MG tablet Take 1 tablet (81 mg total) by mouth daily. 07/10/17   Parthenia Olivia CHRISTELLA, PA-C  betamethasone dipropionate 0.05 % cream Apply 1 Application topically as needed (inflimation in legs). 03/11/23   [provider]  chlorthalidone  (HYGROTON ) 25 MG tablet Take 25 mg by mouth daily.    [provider]  clobetasol  (TEMOVATE ) 0.05 % external solution Apply 1 application  topically 2 (two) times daily. After washing hair 06/24/17   [provider]  cyclobenzaprine  (FLEXERIL ) 10 MG tablet Take 1 tablet (10 mg total) by mouth 3 (three) times daily as needed for muscle spasms. 04/16/23   Meyran, Suzen Lacks, NP  desonide (DESOWEN) 0.05 % cream Apply 1 application  topically as needed (for psoriasis on skin). 04/11/17   [provider]  diclofenac  sodium (VOLTAREN ) 1 % GEL Apply 1 Application topically as needed (for knee pain/arthritis). 05/10/16   [provider]   dicyclomine  (BENTYL ) 20 MG tablet Take 1 tablet (20 mg total) by mouth 2 (two) times daily. 11/13/23   Daralene Lonni BIRCH, PA-C  diphenhydrAMINE  (BENADRYL ) 50 MG tablet Take 1 tablet (50 mg total) by mouth once for 1 dose. Take on date/time instructed by prescribing provider.  Call [phone] for questions. 11/28/23 11/28/23  Kallie Manuelita BROCKS, MD  ketoconazole (NIZORAL) 2 % cream Apply 1 Application topically as needed (rash). 01/14/23   [provider]  KLOR-CON  M10 10 MEQ tablet Take 10 mEq by mouth 2 (two) times daily. 01/06/23   [provider]  levothyroxine  (SYNTHROID ) 137 MCG tablet Take 137 mcg by mouth daily before breakfast.    [provider]  losartan  (COZAAR ) 100 MG tablet Take 100 mg by mouth daily.    [provider]  lubiprostone  (AMITIZA ) 24 MCG capsule Take 1 capsule (24 mcg total) by mouth 2 (two) times daily with a meal. 07/22/23 07/16/24  Kennedy Charmaine CROME, NP  metoprolol  tartrate (LOPRESSOR ) 50 MG tablet Take 1 tablet (50 mg total) by mouth 2 (two) times daily. 04/09/19   Charls Pearla LABOR, MD  metroNIDAZOLE  (FLAGYL ) 500 MG tablet Take 1 tablet (500 mg total) by mouth 2 (two) times daily. 11/13/23   Daralene Lonni BIRCH, PA-C  morphine  (MS CONTIN ) 30 MG 12 hr tablet Take 30 mg by mouth every 12 (twelve) hours.    [provider]  morphine  (MSIR) 15 MG  tablet Take 15 mg by mouth 3 (three) times daily as needed for severe pain (break through pain). 05/15/16   [provider]  Multiple Vitamins-Minerals (HAIR SKIN & NAILS PO) Take 1 tablet by mouth daily.    [provider]  nitroGLYCERIN  (NITROSTAT ) 0.4 MG SL tablet Place 0.4 mg under the tongue every 5 (five) minutes as needed for chest pain.    [provider]  omeprazole (PRILOSEC) 40 MG capsule Take 40 mg by mouth daily.    [provider]  simvastatin  (ZOCOR ) 20 MG tablet Take 20 mg by mouth daily. 11/24/10   [provider]  zolpidem   (AMBIEN ) 5 MG tablet Take 5 mg by mouth at bedtime. 03/21/23   [provider]    Allergies: Iohexol , Nubain [nalbuphine hcl], Penicillins, Iodinated contrast media, and Latex    Review of Systems  Respiratory:  Positive for shortness of breath.   Cardiovascular:  Positive for palpitations.  All other systems reviewed and are negative.   Updated Vital Signs BP 106/63   Pulse 66   Temp 98.3 F (36.8 C) (Oral)   Resp 17   Ht 5' 5 (1.651 m)   Wt 88.5 kg   SpO2 96%   BMI 32.45 kg/m   Physical Exam Vitals and nursing note reviewed.  Constitutional:      Appearance: She is well-developed.  HENT:     Head: Normocephalic and atraumatic.     Mouth/Throat:     Mouth: Mucous membranes are moist.     Pharynx: Oropharynx is clear.  Eyes:     Extraocular Movements: Extraocular movements intact.     Pupils: Pupils are equal, round, and reactive to light.  Cardiovascular:     Rate and Rhythm: Tachycardia present. Rhythm irregular.  Pulmonary:     Effort: Pulmonary effort is normal.     Breath sounds: Normal breath sounds.  Abdominal:     General: Bowel sounds are normal.     Palpations: Abdomen is soft.  Musculoskeletal:        General: Normal range of motion.     Cervical back: Normal range of motion and neck supple.  Skin:    General: Skin is warm.     Capillary Refill: Capillary refill takes less than 2 seconds.  Neurological:     General: No focal deficit present.     Mental Status: She is alert and oriented to person, place, and time.  Psychiatric:        Mood and Affect: Mood normal.        Behavior: Behavior normal.     (all labs ordered are listed, but only abnormal results are displayed) Labs Reviewed  CBC WITH DIFFERENTIAL/PLATELET - Abnormal; Notable for the following components:      Result Value   RBC 5.26 (*)    Hemoglobin 15.6 (*)    All other components within normal limits  COMPREHENSIVE METABOLIC PANEL WITH GFR - Abnormal; Notable for the  following components:   Glucose, Bld 102 (*)    All other components within normal limits  MAGNESIUM  HEPARIN  LEVEL (UNFRACTIONATED)  TROPONIN I (HIGH SENSITIVITY)  TROPONIN I (HIGH SENSITIVITY)    EKG: EKG Interpretation Date/Time:  Monday March 16 2024 14:29:00 EDT Ventricular Rate:  65 PR Interval:  158 QRS Duration:  125 QT Interval:  430 QTC Calculation: 408 R Axis:   104  Text Interpretation: Atrial fibrillation Ventricular bigeminy RBBB and LPFB Since last tracing rate slower Confirmed by Dean Clarity (  [id number]) on 03/16/2024 2:35:00 PM  Radiology: DG Chest Portable 1 View Result Date: 03/16/2024 CLINICAL DATA:  Shortness of breath. EXAM: PORTABLE CHEST 1 VIEW COMPARISON:  October 01, 2023 FINDINGS: The cardiac silhouette is mildly enlarged. This may be technical in origin. Mild, diffuse, chronic appearing increased interstitial lung markings are seen. Mild atelectasis and/or infiltrate is present within the left lung base. No pleural effusion or pneumothorax is identified. Postoperative changes are seen within the lower lumbar spine with multilevel degenerative changes present throughout the thoracic spine. IMPRESSION: Mild left basilar atelectasis and/or infiltrate. Electronically Signed   By: Suzen Dials M.D.   On: 03/16/2024 13:50     Procedures   Medications Ordered in the ED  diltiazem  (CARDIZEM ) 1 mg/mL load via infusion 20 mg (20 mg Intravenous Bolus from Bag 03/16/24 1327)    And  diltiazem  (CARDIZEM ) 125 mg in dextrose  5% 125 mL (1 mg/mL) infusion (5 mg/hr Intravenous New Bag/Given 03/16/24 1327)  potassium chloride  SA (KLOR-CON  M) CR tablet 40 mEq (has no administration in time range)  heparin  bolus via infusion 4,000 Units (has no administration in time range)  heparin  ADULT infusion 100 units/mL (25000 units/250mL) (has no administration in time range)  sodium chloride  0.9 % bolus 500 mL (500 mLs Intravenous Bolus 03/16/24 1359)                                     Medical Decision Making Amount and/or Complexity of Data Reviewed Labs: ordered. Radiology: ordered.  Risk Prescription drug management. Decision regarding hospitalization.   This patient presents to the ED for concern of sob, this involves an extensive number of treatment options, and is a complaint that carries with it a high risk of complications and morbidity.  The differential diagnosis includes pna, chf, arrhythmia, electrolyte abn   Co morbidities that complicate the patient evaluation  depression, HLD, hypothyroidism, TIA, GERD, PUD, CAD, COPD, OA, cervical cancer s/p hyst, skin cancer and PVCs   Additional history obtained:  Additional history obtained from epic chart review External records from outside source obtained and reviewed including husband   Lab Tests:  I Ordered, and personally interpreted labs.  The pertinent results include:  cbc nl, cmp nl, trop nl   Imaging Studies ordered:  I ordered imaging studies including cxr  I independently visualized and interpreted imaging which showed: Mild left basilar atelectasis and/or infiltrate.   I agree with the radiologist interpretation   Cardiac Monitoring:  The patient was maintained on a cardiac monitor.  I personally viewed and interpreted the cardiac monitored which showed an underlying rhythm of: a flutter.  HRs in the 130s to 140s.   Medicines ordered and prescription drug management:  I ordered medication including cardizem   for afib  Reevaluation of the patient after these medicines showed that the patient improved I have reviewed the patients home medicines and have made adjustments as needed   Critical Interventions:  Cardizem /heparin    Consultations Obtained:  I requested consultation with the cardiologist (Dr. Alvan),  and discussed lab and imaging findings as well as pertinent plan - he will put him on his list for consult Pt d/w Dr. Fredirick ([facility]) for  admission   Problem List / ED Course:  Afib with rvr:  SOB likely due to HR going up.  HR did respond to cardizem .  She is also started on heparin . CHA2DS2/VAS Stroke Risk Points: 3  Reevaluation:  After the interventions noted above, I reevaluated the patient and found that they have :improved   Social Determinants of Health:  Lives at home   Dispostion:  After consideration of the diagnostic results and the patients response to treatment, I feel that the patent would benefit from admission.    CRITICAL CARE Performed by: Mliss Boyers   Total critical care time: 30 minutes  Critical care time was exclusive of separately billable procedures and treating other patients.  Critical care was necessary to treat or prevent imminent or life-threatening deterioration.  Critical care was time spent personally by me on the following activities: development of treatment plan with patient and/or surrogate as well as nursing, discussions with consultants, evaluation of patient's response to treatment, examination of patient, obtaining history from patient or surrogate, ordering and performing treatments and interventions, ordering and review of laboratory studies, ordering and review of radiographic studies, pulse oximetry and re-evaluation of patient's condition.       Final diagnoses:  Atrial fibrillation with rapid ventricular response [facility])    ED Discharge Orders     None          Boyers Mliss, MD 03/16/24 [id number]

## 2024-03-16 NOTE — ED Notes (Signed)
 Receiving nurse reports unit is unable to accept report or patient at this time. Receiving nurse advises a return call when unit is ready to accept patient.

## 2024-03-16 NOTE — Plan of Care (Signed)
  Problem: Education: Goal: Knowledge of General Education information will improve Description: Including pain rating scale, medication(s)/side effects and non-pharmacologic comfort measures Outcome: Progressing   Problem: Health Behavior/Discharge Planning: Goal: Ability to manage health-related needs will improve Outcome: Progressing   Problem: Clinical Measurements: Goal: Ability to maintain clinical measurements within normal limits will improve Outcome: Progressing Goal: Will remain free from infection Outcome: Progressing Goal: Diagnostic test results will improve Outcome: Progressing Goal: Respiratory complications will improve Outcome: Progressing Goal: Cardiovascular complication will be avoided Outcome: Progressing   Problem: Activity: Goal: Risk for activity intolerance will decrease Outcome: Progressing   Problem: Nutrition: Goal: Adequate nutrition will be maintained Outcome: Progressing   Problem: Coping: Goal: Level of anxiety will decrease Outcome: Progressing   Problem: Elimination: Goal: Will not experience complications related to bowel motility Outcome: Progressing Goal: Will not experience complications related to urinary retention Outcome: Progressing   Problem: Pain Managment: Goal: General experience of comfort will improve and/or be controlled Outcome: Progressing   Problem: Safety: Goal: Ability to remain free from injury will improve Outcome: Progressing   Problem: Skin Integrity: Goal: Risk for impaired skin integrity will decrease Outcome: Progressing   Problem: Education: Goal: Knowledge of disease or condition will improve Outcome: Progressing Goal: Understanding of medication regimen will improve Outcome: Progressing   Problem: Activity: Goal: Ability to tolerate increased activity will improve Outcome: Progressing   Problem: Cardiac: Goal: Ability to achieve and maintain adequate cardiopulmonary perfusion will  improve Outcome: Progressing

## 2024-03-16 NOTE — Progress Notes (Addendum)
 PHARMACY - ANTICOAGULATION CONSULT NOTE  Pharmacy Consult for heparin  Indication: atrial fibrillation  Allergies  Allergen Reactions   Iohexol  Shortness Of Breath and Swelling     Desc: Pt states she had Cardia Cath around 2000/ experienced SOB and swelling of upper body during cath procedure/ tsf    Nubain [Nalbuphine Hcl] Shortness Of Breath and Other (See Comments)    ??  DOSE RELATED  ??   Penicillins Itching, Rash and Other (See Comments)   Iodinated Contrast Media Swelling   Latex Rash    Patient Measurements: Height: 5' 5 (165.1 cm) Weight: 88.5 kg (195 lb) IBW/kg (Calculated) : 57 HEPARIN  DW (KG): 76.4  Vital Signs: Temp: 98.3 F (36.8 C) (08/11 1216) Temp Source: Oral (08/11 1216) BP: 106/63 (08/11 1430) Pulse Rate: 66 (08/11 1430)  Labs: Recent Labs    03/16/24 1238  HGB 15.6*  HCT 45.6  PLT 256  CREATININE 0.76  TROPONINIHS 8    Estimated Creatinine Clearance: 69.8 mL/min (by C-G formula based on SCr of 0.76 mg/dL).   Medical History: Past Medical History:  Diagnosis Date   Abnormal findings on imaging of biliary tract 1997-2000   CBD 21mm, Dr Antonio biliary/pancreatic manometry, EUS Dr. Teressa 09/18/07 dilated but otherwise normal extrahepatic duct, no masses   Anemia    Arthritis    Asthma    Bursitis    Cervical ca (HCC) 1984   partial hysterectomy   Chronic abdominal pain    Functional, seen @[facility] , extensive WU here benign    Chronic back pain    On methadone    Constipation    COPD (chronic obstructive pulmonary disease) (HCC)    Degenerative lumbar spinal stenosis 10/2022   Depression    w/ menopause   Dyspnea    with exertion   Essential hypertension    GERD (gastroesophageal reflux disease)    H/O hiatal hernia    Headache    Hx of migraines   Hemorrhoids    Hyperlipemia    Hypothyroidism    MI (myocardial infarction) (HCC) 2000   Details not clear   Opioid dependence (HCC)    morphine    Osteoarthritis    Palpitations     PVCs   Pneumonia    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 Leslee sec to tortuous colon, followed by [facility] & flex sig normal   Skin cancer    Nose   TIA (transient ischemic attack) 1983    Medications:  (Not in a hospital admission)   Assessment: Pharmacy consulted to dose heparin  in patient with atrial fibrillation.  Patient is not on anticoagulation prior to admission.  Goal of Therapy:  Heparin  level 0.3-0.7 units/ml Monitor platelets by anticoagulation protocol: Yes   Plan:  Give 4000 units bolus x 1 Start heparin  infusion at 950 units/hr Check anti-Xa level in 6-8 hours and daily while on heparin  Continue to monitor H&H and platelets  [name], PharmD Clinical Pharmacist 03/16/2024 3:10 PM

## 2024-03-16 NOTE — Assessment & Plan Note (Signed)
 Hold home losartan  due to soft BP use with Cardizem  drip Continue metoprolol  Continue chlorthalidone 

## 2024-03-16 NOTE — H&P (Signed)
 History and Physical    Patient: Selena [name] DOB: [birth year] DOA: 03/16/2024 DOS: the patient was seen and examined on 03/16/2024 PCP: Sheryle Carwin, MD  Patient coming from: Home  Chief Complaint:  Chief Complaint  Patient presents with   Shortness of Breath   Weakness   HPI: Selena [name] is a 73 y.o. female with medical history significant of HTN, HLD, hypothyroidism, chronic back pain on multiple opioids, GERD, CAD who was admitted with new onset A-fib with RVR.  She notes increasing shortness of breath and palpitations over the last couple of days.  She checked her home O2 sats and they were noted to be in the 50s.  When she arrived to the ED she had a heart rate in the 130s to 140s.  Cardiology consult was obtained.  The patient was started on Cardizem  and heparin  drips we are asked to admit.  Review of Systems: As mentioned in the history of present illness. All other systems reviewed and are negative. Past Medical History:  Diagnosis Date   Abnormal findings on imaging of biliary tract 1997-2000   CBD 21mm, Dr Antonio biliary/pancreatic manometry, EUS Dr. Teressa 09/18/07 dilated but otherwise normal extrahepatic duct, no masses   Anemia    Arthritis    Asthma    Bursitis    Cervical ca (HCC) 1984   partial hysterectomy   Chronic abdominal pain    Functional, seen @[facility] , extensive WU here benign    Chronic back pain    On methadone    Constipation    COPD (chronic obstructive pulmonary disease) (HCC)    Degenerative lumbar spinal stenosis 10/2022   Depression    w/ menopause   Dyspnea    with exertion   Essential hypertension    GERD (gastroesophageal reflux disease)    H/O hiatal hernia    Headache    Hx of migraines   Hemorrhoids    Hyperlipemia    Hypothyroidism    MI (myocardial infarction) (HCC) 2000   Details not clear   Opioid dependence (HCC)    morphine    Osteoarthritis    Palpitations    PVCs   Pneumonia    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 Leslee sec to tortuous colon, followed by BE & flex sig normal   Skin cancer    Nose   TIA (transient ischemic attack) 1983   Past Surgical History:  Procedure Laterality Date   ABDOMINAL EXPOSURE N/A 10/05/2019   Procedure: ABDOMINAL EXPOSURE;  Surgeon: Gretta Lonni PARAS, MD;  Location: [facility] OR;  Service: Vascular;  Laterality: N/A;   ABDOMINAL HYSTERECTOMY     ANTERIOR CERVICAL DECOMP/DISCECTOMY FUSION N/A 04/15/2023   Procedure: Anterior Cervical Decompression Fusion  Cervical five-Cervical six - Cervical six-Cervical seven;  Surgeon: Onetha Kuba, MD;  Location: [facility] OR;  Service: Neurosurgery;  Laterality: N/A;   ANTERIOR LUMBAR FUSION N/A 10/05/2019   Procedure: Anterior Lumbar Interbody Fusion - Lumbar Five-Sacral One;  Surgeon: Onetha Kuba, MD;  Location: [facility] OR;  Service: Neurosurgery;  Laterality: N/A;  Anterior Lumbar Interbody Fusion - Lumbar Five-Sacral One   APPENDECTOMY     arthroscopic knee     BACK SURGERY     BIOPSY  12/18/2021   Procedure: BIOPSY;  Surgeon: Cindie Carlin POUR, DO;  Location: [facility]ITE;  Service: Endoscopy;;   CARDIAC CATHETERIZATION  2005   CATARACT EXTRACTION W/PHACO Right 09/02/2014  Procedure: CATARACT EXTRACTION PHACO AND INTRAOCULAR LENS PLACEMENT RIGHT EYE;  Surgeon: Cherene Mania, MD;  Location: [facility] ORS;  Service: Ophthalmology;  Laterality: Right;  CDE:8.96   CATARACT EXTRACTION W/PHACO Left 09/13/2014   Procedure: CATARACT EXTRACTION PHACO AND INTRAOCULAR LENS PLACEMENT LEFT EYE;  Surgeon: Cherene Mania, MD;  Location: [facility] ORS;  Service: Ophthalmology;  Laterality: Left;  CDE 6.20   CHOLECYSTECTOMY     COLONOSCOPY WITH PROPOFOL  N/A 12/18/2021   Procedure: COLONOSCOPY WITH PROPOFOL ;  Surgeon: Cindie Carlin POUR, DO;  Location: [facility];  Service: Endoscopy;  Laterality: N/A;  12:15pm   ESOPHAGOGASTRODUODENOSCOPY (EGD) WITH PROPOFOL   N/A 12/18/2021   Procedure: ESOPHAGOGASTRODUODENOSCOPY (EGD) WITH PROPOFOL ;  Surgeon: Cindie Carlin POUR, DO;  Location: [facility];  Service: Endoscopy;  Laterality: N/A;   EYE SURGERY Bilateral    cataract removal   INGUINAL HERNIA REPAIR     left   JOINT REPLACEMENT Bilateral    knees   LEFT HEART CATH AND CORONARY ANGIOGRAPHY N/A 10/19/2020   Procedure: LEFT HEART CATH AND CORONARY ANGIOGRAPHY;  Surgeon: Claudene Victory ORN, MD;  Location: [facility] INVASIVE CV LAB;  Service: Cardiovascular;  Laterality: N/A;   PARTIAL HYSTERECTOMY     right arm     nerve   TONSILLECTOMY     TOTAL KNEE ARTHROPLASTY  06/20/2012   Procedure: TOTAL KNEE ARTHROPLASTY;  Surgeon: ORN JONETTA Shari Mickey., MD;  Location: [facility] OR;  Service: Orthopedics;  Laterality: Right;   TOTAL KNEE ARTHROPLASTY Left 04/02/2014   Procedure: TOTAL KNEE ARTHROPLASTY;  Surgeon: ORN JONETTA Shari Mickey., MD;  Location: [facility] OR;  Service: Orthopedics;  Laterality: Left;   Social History:  reports that she quit smoking about 20 years ago. Her smoking use included cigarettes. She started smoking about 42 years ago. She has a 22.4 pack-year smoking history. She has never used smokeless tobacco. She reports that she does not drink alcohol and does not use drugs.  Allergies  Allergen Reactions   Iohexol  Shortness Of Breath and Swelling     Desc: Pt states she had Cardia Cath around 2000/ experienced SOB and swelling of upper body during cath procedure/ tsf    Nubain [Nalbuphine Hcl] Shortness Of Breath and Other (See Comments)    ??  DOSE RELATED  ??   Penicillins Itching, Rash and Other (See Comments)   Iodinated Contrast Media Swelling   Latex Rash    Family History  Problem Relation Age of Onset   Ulcers Brother    Heart attack Brother    Ulcers Sister    Thyroid  disease Daughter    Hypertension Daughter    Other Daughter        nervous   Hypertension Sister    Heart disease Sister    Thyroid  disease Sister    Hypertension Sister    Heart  disease Sister    Thyroid  disease Sister    Heart attack Brother    Cancer Brother        prostate    Prior to Admission medications   Medication Sig Start Date End Date Taking? Authorizing Provider  albuterol  (PROVENTIL  HFA;VENTOLIN  HFA) 108 (90 Base) MCG/ACT inhaler Inhale 2 puffs into the lungs every 4 (four) hours as needed for wheezing or shortness of breath. Patient taking differently: Inhale 2 puffs into the lungs as needed for wheezing or shortness of breath. 09/22/16   Joyice Sauer, DO  aspirin  EC 81 MG tablet Take 1 tablet (81 mg total) by mouth daily. 07/10/17  Parthenia Olivia HERO, PA-C  betamethasone dipropionate 0.05 % cream Apply 1 Application topically as needed (inflimation in legs). 03/11/23   [provider]  chlorthalidone  (HYGROTON ) 25 MG tablet Take 25 mg by mouth daily.    [provider]  clobetasol  (TEMOVATE ) 0.05 % external solution Apply 1 application  topically 2 (two) times daily. After washing hair 06/24/17   [provider]  cyclobenzaprine  (FLEXERIL ) 10 MG tablet Take 1 tablet (10 mg total) by mouth 3 (three) times daily as needed for muscle spasms. 04/16/23   Meyran, Suzen Lacks, NP  desonide (DESOWEN) 0.05 % cream Apply 1 application  topically as needed (for psoriasis on skin). 04/11/17   [provider]  diclofenac  sodium (VOLTAREN ) 1 % GEL Apply 1 Application topically as needed (for knee pain/arthritis). 05/10/16   [provider]  dicyclomine  (BENTYL ) 20 MG tablet Take 1 tablet (20 mg total) by mouth 2 (two) times daily. 11/13/23   Daralene Lonni BIRCH, PA-C  diphenhydrAMINE  (BENADRYL ) 50 MG tablet Take 1 tablet (50 mg total) by mouth once for 1 dose. Take on date/time instructed by prescribing provider.  Call [phone] for questions. 11/28/23 11/28/23  Kallie Manuelita BROCKS, MD  ketoconazole (NIZORAL) 2 % cream Apply 1 Application topically as needed (rash). 01/14/23   [provider]  KLOR-CON  M10 10 MEQ  tablet Take 10 mEq by mouth 2 (two) times daily. 01/06/23   [provider]  levothyroxine  (SYNTHROID ) 137 MCG tablet Take 137 mcg by mouth daily before breakfast.    [provider]  losartan  (COZAAR ) 100 MG tablet Take 100 mg by mouth daily.    [provider]  lubiprostone  (AMITIZA ) 24 MCG capsule Take 1 capsule (24 mcg total) by mouth 2 (two) times daily with a meal. 07/22/23 07/16/24  Kennedy Charmaine CROME, NP  metoprolol  tartrate (LOPRESSOR ) 50 MG tablet Take 1 tablet (50 mg total) by mouth 2 (two) times daily. 04/09/19   Charls Pearla LABOR, MD  metroNIDAZOLE  (FLAGYL ) 500 MG tablet Take 1 tablet (500 mg total) by mouth 2 (two) times daily. 11/13/23   Daralene Lonni BIRCH, PA-C  morphine  (MS CONTIN ) 30 MG 12 hr tablet Take 30 mg by mouth every 12 (twelve) hours.    [provider]  morphine  (MSIR) 15 MG tablet Take 15 mg by mouth 3 (three) times daily as needed for severe pain (break through pain). 05/15/16   [provider]  Multiple Vitamins-Minerals (HAIR SKIN & NAILS PO) Take 1 tablet by mouth daily.    [provider]  nitroGLYCERIN  (NITROSTAT ) 0.4 MG SL tablet Place 0.4 mg under the tongue every 5 (five) minutes as needed for chest pain.    [provider]  omeprazole (PRILOSEC) 40 MG capsule Take 40 mg by mouth daily.    [provider]  simvastatin  (ZOCOR ) 20 MG tablet Take 20 mg by mouth daily. 11/24/10   [provider]  zolpidem  (AMBIEN ) 5 MG tablet Take 5 mg by mouth at bedtime. 03/21/23   [provider]    Physical Exam: Vitals:   03/16/24 1400 03/16/24 1405 03/16/24 1415 03/16/24 1430  BP:  113/76 118/70 106/63  Pulse:  73 (!) 59 66  Resp:  19 19 17   Temp:      TempSrc:      SpO2: 93% 93% 97% 96%  Weight:      Height:       Physical Examination: General appearance - chronically ill appearing Chest - clear to auscultation, no  wheezes, rales or rhonchi, symmetric air entry Heart - normal  rate, regular rhythm, normal S1, S2, no murmurs, rubs, clicks or gallops Abdomen - soft, nontender, nondistended, no masses or organomegaly Extremities - peripheral pulses normal, no pedal edema, no clubbing or cyanosis  Data Reviewed: Results for orders placed or performed during the hospital encounter of 03/16/24 (from the past 24 hours)  CBC with Differential     Status: Abnormal   Collection Time: 03/16/24 12:38 PM  Result Value Ref Range   WBC 8.9 4.0 - 10.5 K/uL   RBC 5.26 (H) 3.87 - 5.11 MIL/uL   Hemoglobin 15.6 (H) 12.0 - 15.0 g/dL   HCT 54.3 63.9 - 53.9 %   MCV 86.7 80.0 - 100.0 fL   MCH 29.7 26.0 - 34.0 pg   MCHC 34.2 30.0 - 36.0 g/dL   RDW 86.2 88.4 - 84.4 %   Platelets 256 150 - 400 K/uL   nRBC 0.0 0.0 - 0.2 %   Neutrophils Relative % 54 %   Neutro Abs 4.8 1.7 - 7.7 K/uL   Lymphocytes Relative 30 %   Lymphs Abs 2.7 0.7 - 4.0 K/uL   Monocytes Relative 11 %   Monocytes Absolute 1.0 0.1 - 1.0 K/uL   Eosinophils Relative 4 %   Eosinophils Absolute 0.4 0.0 - 0.5 K/uL   Basophils Relative 1 %   Basophils Absolute 0.1 0.0 - 0.1 K/uL   Immature Granulocytes 0 %   Abs Immature Granulocytes 0.01 0.00 - 0.07 K/uL  Comprehensive metabolic panel     Status: Abnormal   Collection Time: 03/16/24 12:38 PM  Result Value Ref Range   Sodium 140 135 - 145 mmol/L   Potassium 3.7 3.5 - 5.1 mmol/L   Chloride 105 98 - 111 mmol/L   CO2 24 22 - 32 mmol/L   Glucose, Bld 102 (H) 70 - 99 mg/dL   BUN 18 8 - 23 mg/dL   Creatinine, Ser 9.23 0.44 - 1.00 mg/dL   Calcium 9.5 8.9 - 89.6 mg/dL   Total Protein 7.0 6.5 - 8.1 g/dL   Albumin  3.7 3.5 - 5.0 g/dL   AST 28 15 - 41 U/L   ALT 22 0 - 44 U/L   Alkaline Phosphatase 91 38 - 126 U/L   Total Bilirubin 0.8 0.0 - 1.2 mg/dL   GFR, Estimated >39 >39 mL/min   Anion gap 11 5 - 15  Troponin I (High Sensitivity)     Status: None   Collection Time: 03/16/24 12:38 PM  Result Value Ref Range   Troponin I (High Sensitivity) 8 <18 ng/L  Troponin I  (High Sensitivity)     Status: None   Collection Time: 03/16/24  2:54 PM  Result Value Ref Range   Troponin I (High Sensitivity) 7 <18 ng/L  Magnesium     Status: None   Collection Time: 03/16/24  3:06 PM  Result Value Ref Range   Magnesium 1.7 1.7 - 2.4 mg/dL   DG Chest Portable 1 View Result Date: 03/16/2024 CLINICAL DATA:  Shortness of breath. EXAM: PORTABLE CHEST 1 VIEW COMPARISON:  October 01, 2023 FINDINGS: The cardiac silhouette is mildly enlarged. This may be technical in origin. Mild, diffuse, chronic appearing increased interstitial lung markings are seen. Mild atelectasis and/or infiltrate is present within the left lung base. No pleural effusion or pneumothorax is identified. Postoperative changes are seen within the lower lumbar spine with multilevel degenerative changes present throughout the thoracic spine. IMPRESSION: Mild left basilar atelectasis  and/or infiltrate. Electronically Signed   By: Suzen Dials M.D.   On: 03/16/2024 13:50   EKG now in sinus rhythm, frequent PVCs, right bundle branch block  Assessment and Plan: * Atrial fibrillation (HCC) Versus a flutter Started on Cardizem  [place] score is 3, will need anticoagulation On heparin  drip to transition to Eliquis  in the morning Spontaneously converted back to sinus rhythm with frequent PVCs Continue telemetry monitoring  Therapeutic opioid induced constipation Continue Amitiza   Opioid dependence (HCC) Continue home opioids These include MS Contin  30 twice daily MS IR 15 3 times daily as needed Oxycodone  tens twice daily as needed The patient does have some mild hypoxia and should give consideration for decreasing her opioids as an outpatient  Insomnia Continue MBM  CAD (coronary artery disease) Continue beta-blocker Continue statin Nitroglycerin  as needed  negative tropes   Chronic back pain Continue her home opioids  Continue muscle relaxer  Hypothyroidism Check TSH Continue  levothyroxine   Hyperlipemia Continue simvastatin   HTN (hypertension) Hold home losartan  due to soft BP use with Cardizem  drip Continue metoprolol  Continue chlorthalidone   GERD (gastroesophageal reflux disease) Continue PPI Continue Bentyl         Advance Care Planning:   Code Status: Prior full  Consults: Cardiology  Family Communication: Husband at bedside  Severity of Illness: The appropriate patient status for this patient is OBSERVATION. Observation status is judged to be reasonable and necessary in order to provide the required intensity of service to ensure the patient's safety. The patient's presenting symptoms, physical exam findings, and initial radiographic and laboratory data in the context of their medical condition is felt to place them at decreased risk for further clinical deterioration. Furthermore, it is anticipated that the patient will be medically stable for discharge from the hospital within 2 midnights of admission.   Author: Glenys GORMAN Birk, MD 03/16/2024 3:19 PM  For on call review [email or web address].

## 2024-03-17 ENCOUNTER — Other Ambulatory Visit (HOSPITAL_COMMUNITY): Payer: Self-pay

## 2024-03-17 ENCOUNTER — Observation Stay (HOSPITAL_BASED_OUTPATIENT_CLINIC_OR_DEPARTMENT_OTHER)

## 2024-03-17 ENCOUNTER — Ambulatory Visit: Attending: Cardiology

## 2024-03-17 ENCOUNTER — Telehealth (HOSPITAL_COMMUNITY): Payer: Self-pay | Admitting: Pharmacy Technician

## 2024-03-17 ENCOUNTER — Telehealth: Payer: Self-pay | Admitting: *Deleted

## 2024-03-17 DIAGNOSIS — E039 Hypothyroidism, unspecified: Secondary | ICD-10-CM | POA: Diagnosis not present

## 2024-03-17 DIAGNOSIS — I480 Paroxysmal atrial fibrillation: Secondary | ICD-10-CM

## 2024-03-17 DIAGNOSIS — I10 Essential (primary) hypertension: Secondary | ICD-10-CM | POA: Diagnosis not present

## 2024-03-17 DIAGNOSIS — G4700 Insomnia, unspecified: Secondary | ICD-10-CM

## 2024-03-17 DIAGNOSIS — K219 Gastro-esophageal reflux disease without esophagitis: Secondary | ICD-10-CM | POA: Diagnosis not present

## 2024-03-17 DIAGNOSIS — I4892 Unspecified atrial flutter: Secondary | ICD-10-CM | POA: Diagnosis not present

## 2024-03-17 DIAGNOSIS — E785 Hyperlipidemia, unspecified: Secondary | ICD-10-CM | POA: Diagnosis not present

## 2024-03-17 DIAGNOSIS — I4891 Unspecified atrial fibrillation: Secondary | ICD-10-CM | POA: Diagnosis not present

## 2024-03-17 DIAGNOSIS — F1120 Opioid dependence, uncomplicated: Secondary | ICD-10-CM

## 2024-03-17 LAB — CBC
HCT: 42.8 % (ref 36.0–46.0)
Hemoglobin: 14 g/dL (ref 12.0–15.0)
MCH: 28.7 pg (ref 26.0–34.0)
MCHC: 32.7 g/dL (ref 30.0–36.0)
MCV: 87.7 fL (ref 80.0–100.0)
Platelets: 241 K/uL (ref 150–400)
RBC: 4.88 MIL/uL (ref 3.87–5.11)
RDW: 13.8 % (ref 11.5–15.5)
WBC: 7.4 K/uL (ref 4.0–10.5)
nRBC: 0 % (ref 0.0–0.2)

## 2024-03-17 LAB — HEPARIN LEVEL (UNFRACTIONATED)
Heparin Unfractionated: 0.35 [IU]/mL (ref 0.30–0.70)
Heparin Unfractionated: 0.39 [IU]/mL (ref 0.30–0.70)

## 2024-03-17 LAB — ECHOCARDIOGRAM COMPLETE
AR max vel: 1.72 cm2
AV Area VTI: 1.68 cm2
AV Area mean vel: 1.96 cm2
AV Mean grad: 4 mmHg
AV Peak grad: 8.5 mmHg
Ao pk vel: 1.46 m/s
Area-P 1/2: 2.57 cm2
Height: 65 in
S' Lateral: 2.9 cm
Weight: 3149.93 [oz_av]

## 2024-03-17 LAB — BASIC METABOLIC PANEL WITH GFR
Anion gap: 8 (ref 5–15)
BUN: 16 mg/dL (ref 8–23)
CO2: 25 mmol/L (ref 22–32)
Calcium: 9.3 mg/dL (ref 8.9–10.3)
Chloride: 106 mmol/L (ref 98–111)
Creatinine, Ser: 0.61 mg/dL (ref 0.44–1.00)
GFR, Estimated: >60 mL/min (ref >60)
Glucose, Bld: 99 mg/dL (ref 70–99)
Potassium: 3.4 mmol/L — ABNORMAL LOW (ref 3.5–5.1)
Sodium: 139 mmol/L (ref 135–145)

## 2024-03-17 LAB — TSH: TSH: 0.628 u[IU]/mL (ref 0.350–4.500)

## 2024-03-17 MED ORDER — LOSARTAN POTASSIUM 50 MG PO TABS: 50.0000 mg | ORAL_TABLET | Freq: Every day | ORAL | 5 refills | Status: AC

## 2024-03-17 MED ORDER — METOPROLOL TARTRATE 50 MG PO TABS: 75.0000 mg | ORAL_TABLET | Freq: Two times a day (BID) | ORAL | Status: DC

## 2024-03-17 MED ORDER — POTASSIUM CHLORIDE ER 10 MEQ PO TBCR: 10.0000 meq | EXTENDED_RELEASE_TABLET | Freq: Every day | ORAL | 2 refills | Status: DC

## 2024-03-17 MED ORDER — APIXABAN 5 MG PO TABS: 5.0000 mg | ORAL_TABLET | Freq: Two times a day (BID) | ORAL | 11 refills | Status: AC

## 2024-03-17 MED ORDER — POTASSIUM CHLORIDE CRYS ER 20 MEQ PO TBCR: 10.0000 meq | EXTENDED_RELEASE_TABLET | Freq: Two times a day (BID) | ORAL | Status: DC

## 2024-03-17 MED ORDER — METOPROLOL TARTRATE 25 MG PO TABS: 25.0000 mg | ORAL_TABLET | Freq: Two times a day (BID) | ORAL | 11 refills | Status: AC

## 2024-03-17 MED ORDER — LEVOTHYROXINE SODIUM 125 MCG PO TABS: 125.0000 ug | ORAL_TABLET | Freq: Every day | ORAL | 5 refills | Status: AC

## 2024-03-17 MED ORDER — APIXABAN 5 MG PO TABS
5.0000 mg | ORAL_TABLET | Freq: Two times a day (BID) | ORAL | Status: DC
  Administered 2024-03-17 (×2): 5 mg via ORAL
  Filled 2024-03-17: qty 1

## 2024-03-17 MED ORDER — ALBUTEROL SULFATE HFA 108 (90 BASE) MCG/ACT IN AERS: 2.0000 | INHALATION_SPRAY | RESPIRATORY_TRACT | 0 refills | Status: AC | PRN

## 2024-03-17 MED ORDER — PERFLUTREN LIPID MICROSPHERE
1.0000 mL | INTRAVENOUS | Status: AC | PRN
  Administered 2024-03-17 (×2): 2 mL via INTRAVENOUS

## 2024-03-17 MED ORDER — POTASSIUM CHLORIDE CRYS ER 20 MEQ PO TBCR
40.0000 meq | EXTENDED_RELEASE_TABLET | ORAL | Status: AC
  Administered 2024-03-17 (×4): 40 meq via ORAL
  Filled 2024-03-17 (×2): qty 2

## 2024-03-17 MED ORDER — LOSARTAN POTASSIUM 50 MG PO TABS
50.0000 mg | ORAL_TABLET | Freq: Every day | ORAL | Status: DC
  Administered 2024-03-17 (×2): 50 mg via ORAL
  Filled 2024-03-17: qty 1

## 2024-03-17 MED ORDER — METOPROLOL TARTRATE 50 MG PO TABS: 50.0000 mg | ORAL_TABLET | Freq: Two times a day (BID) | ORAL | 11 refills | Status: AC

## 2024-03-17 NOTE — Progress Notes (Addendum)
 PHARMACY - ANTICOAGULATION CONSULT NOTE  Pharmacy Consult for heparin  >> Eliquis  Indication: atrial fibrillation  Allergies  Allergen Reactions   Iohexol  Shortness Of Breath and Swelling     Desc: Pt states she had Cardia Cath around 2000/ experienced SOB and swelling of upper body during cath procedure/ tsf    Nubain [Nalbuphine Hcl] Shortness Of Breath and Other (See Comments)    ??  DOSE RELATED  ??   Penicillins Itching, Rash and Other (See Comments)   Iodinated Contrast Media Swelling   Latex Rash    Patient Measurements: Height: 5' 5 (165.1 cm) Weight: 89.3 kg (196 lb 13.9 oz) IBW/kg (Calculated) : 57 HEPARIN  DW (KG): 76.7  Vital Signs: Temp: 98.2 F (36.8 C) (08/12 0736) Temp Source: Oral (08/12 0736) BP: 145/69 (08/12 0744) Pulse Rate: 84 (08/12 0830)  Labs: Recent Labs    03/16/24 1238 03/16/24 1454 03/16/24 2337 03/17/24 0459  HGB 15.6*  --   --  14.0  HCT 45.6  --   --  42.8  PLT 256  --   --  241  HEPARINUNFRC  --   --  0.39 0.35  CREATININE 0.76  --   --  0.61  TROPONINIHS 8 7  --   --     Estimated Creatinine Clearance: 70.1 mL/min (by C-G formula based on SCr of 0.61 mg/dL).   Medical History: Past Medical History:  Diagnosis Date   Abnormal findings on imaging of biliary tract 1997-2000   CBD 21mm, Dr Antonio biliary/pancreatic manometry, EUS Dr. Teressa 09/18/07 dilated but otherwise normal extrahepatic duct, no masses   Anemia    Arthritis    Asthma    Bursitis    Cervical ca (HCC) 1984   partial hysterectomy   Chronic abdominal pain    Functional, seen @[facility] , extensive WU here benign    Chronic back pain    On methadone    Constipation    COPD (chronic obstructive pulmonary disease) (HCC)    Degenerative lumbar spinal stenosis 10/2022   Depression    w/ menopause   Dyspnea    with exertion   Essential hypertension    GERD (gastroesophageal reflux disease)    H/O hiatal hernia    Headache    Hx of migraines   Hemorrhoids     Hyperlipemia    Hypothyroidism    MI (myocardial infarction) (HCC) 2000   Details not clear   Opioid dependence (HCC)    morphine    Osteoarthritis    Palpitations    PVCs   Pneumonia    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 Leslee sec to tortuous colon, followed by BE & flex sig normal   Skin cancer    Nose   TIA (transient ischemic attack) 1983    Medications:  Medications Prior to Admission  Medication Sig Dispense Refill Last Dose/Taking   albuterol  (PROVENTIL  HFA;VENTOLIN  HFA) 108 (90 Base) MCG/ACT inhaler Inhale 2 puffs into the lungs every 4 (four) hours as needed for wheezing or shortness of breath. (Patient taking differently: Inhale 2 puffs into the lungs as needed for wheezing or shortness of breath.) 1 Inhaler 0 Past Week   aspirin  EC 81 MG tablet Take 1 tablet (81 mg total) by mouth daily. 90 tablet 3 03/15/2024   betamethasone dipropionate 0.05 % cream Apply 1 Application topically as needed (inflimation in legs).   Past Month   chlorthalidone  (HYGROTON )  25 MG tablet Take 25 mg by mouth daily.   03/16/2024 Morning   clobetasol  (TEMOVATE ) 0.05 % external solution Apply 1 application  topically 2 (two) times daily. After washing hair  3 Past Month   cyclobenzaprine  (FLEXERIL ) 10 MG tablet Take 1 tablet (10 mg total) by mouth 3 (three) times daily as needed for muscle spasms. 30 tablet 0 03/16/2024   desonide (DESOWEN) 0.05 % cream Apply 1 application  topically as needed (for psoriasis on skin).  3 Past Month   diclofenac  sodium (VOLTAREN ) 1 % GEL Apply 1 Application topically as needed (for knee pain/arthritis).   Past Month   ketoconazole (NIZORAL) 2 % cream Apply 1 Application topically as needed (rash).   Past Month   KLOR-CON  M10 10 MEQ tablet Take 10 mEq by mouth 2 (two) times daily.   03/16/2024 Morning   levothyroxine  (SYNTHROID ) 137 MCG tablet Take 137 mcg by mouth  daily before breakfast.   03/16/2024 Morning   losartan  (COZAAR ) 100 MG tablet Take 100 mg by mouth daily.   03/16/2024   lubiprostone  (AMITIZA ) 24 MCG capsule Take 1 capsule (24 mcg total) by mouth 2 (two) times daily with a meal. 180 capsule 1 03/16/2024   metoprolol  tartrate (LOPRESSOR ) 50 MG tablet Take 1 tablet (50 mg total) by mouth 2 (two) times daily. 180 tablet 3 03/16/2024 at  6:00 AM   morphine  (MS CONTIN ) 30 MG 12 hr tablet Take 30 mg by mouth every 12 (twelve) hours.   03/16/2024 at  6:00 AM   morphine  (MSIR) 15 MG tablet Take 15 mg by mouth 3 (three) times daily as needed for severe pain (break through pain).   Past Week   nitroGLYCERIN  (NITROSTAT ) 0.4 MG SL tablet Place 0.4 mg under the tongue every 5 (five) minutes as needed for chest pain.   Unknown   omeprazole (PRILOSEC) 40 MG capsule Take 40 mg by mouth daily.   03/16/2024 Morning   PERCOCET 10-325 MG tablet Take 1 tablet by mouth 2 (two) times daily as needed for pain.   03/16/2024 Morning   simvastatin  (ZOCOR ) 20 MG tablet Take 20 mg by mouth daily.   03/15/2024 Bedtime   zolpidem  (AMBIEN ) 5 MG tablet Take 5 mg by mouth at bedtime.   03/15/2024 Bedtime   metroNIDAZOLE  (FLAGYL ) 500 MG tablet Take 1 tablet (500 mg total) by mouth 2 (two) times daily. 14 tablet 0     Assessment: Pharmacy consulted to dose heparin  in patient with atrial fibrillation.  Patient is not on anticoagulation prior to admission.  HL 0.35- therapeutic CBC WNL  Goal of Therapy:  Heparin  level 0.3-0.7 units/ml Monitor platelets by anticoagulation protocol: Yes   Plan:  Stop heparin  infusion  Start apixaban  5 mg twice daily Monitor H&H and s/s of bleeding.  [name], PharmD Clinical Pharmacist 03/17/2024 8:46 AM

## 2024-03-17 NOTE — Progress Notes (Signed)
 Patient alert and oriented x4. No current complaints of pain, shortness of breath, chest pain, dizziness, nausea or vomiting. Patient ambulated around unit x2 with heart rate remaining in the 90's (about 98). O2 sat on room air greater than 90%. Dr Rendall aware. Patient tolerated PO meds and diet well. Appetite good. IV removed with no signs or symptoms of complications. Dressing clean, dry and intact upon discharge. Went over discharge summary, follow up appointments and medication(s) education with patient. All questions answered and patient expressed full understanding of discharge summary, appointments and education with teach back. Patient was given A-fib/flutter booklet and expressed full understanding. Patient discharged with all belongings for home via car.

## 2024-03-17 NOTE — Discharge Instructions (Addendum)
 1)Watch for bleeding while on Blood Thinners--watch for blood in your stool which can make your stool black, maroon, mahogany or red---, blood in your urine which can make your urine pink or red, nosebleeds , also watch for possible bruising -You are taking Apixaban /Eliquis --- which is a blood thinner--- be careful to avoid injury or falls  2)Avoid ibuprofen/Advil/Aleve/Motrin/Goody Powders/Naproxen/BC powders/Meloxicam/Diclofenac /Indomethacin and other Nonsteroidal anti-inflammatory medications as these will make you more likely to bleed and can cause stomach ulcers, can also cause Kidney problems.   3)Please Take Metoprolol  50 mg along with metoprolol  25 mg twice daily for total of 75 mg twice daily  Information on my medicine - ELIQUIS  (apixaban )  This medication education was reviewed with me or my healthcare representative as part of my discharge preparation.  The pharmacist that spoke with me during my hospital stay was:  Elspeth JAYSON Sour, [facility]  Why was Eliquis  prescribed for you? Eliquis  was prescribed for you to reduce the risk of a blood clot forming that can cause a stroke if you have a medical condition called atrial fibrillation (a type of irregular heartbeat).  What do You need to know about Eliquis  ? Take your Eliquis  TWICE DAILY - one tablet in the morning and one tablet in the evening with or without food. If you have difficulty swallowing the tablet whole please discuss with your pharmacist how to take the medication safely.  Take Eliquis  exactly as prescribed by your doctor and DO NOT stop taking Eliquis  without talking to the doctor who prescribed the medication.  Stopping may increase your risk of developing a stroke.  Refill your prescription before you run out.  After discharge, you should have regular check-up appointments with your healthcare provider that is prescribing your Eliquis .  In the future your dose may need to be changed if your kidney function or weight  changes by a significant amount or as you get older.  What do you do if you miss a dose? If you miss a dose, take it as soon as you remember on the same day and resume taking twice daily.  Do not take more than one dose of ELIQUIS  at the same time to make up a missed dose.  Important Safety Information A possible side effect of Eliquis  is bleeding. You should call your healthcare provider right away if you experience any of the following: Bleeding from an injury or your nose that does not stop. Unusual colored urine (red or dark brown) or unusual colored stools (red or black). Unusual bruising for unknown reasons. A serious fall or if you hit your head (even if there is no bleeding).  Some medicines may interact with Eliquis  and might increase your risk of bleeding or clotting while on Eliquis . To help avoid this, consult your healthcare provider or pharmacist prior to using any new prescription or non-prescription medications, including herbals, vitamins, non-steroidal anti-inflammatory drugs (NSAIDs) and supplements.  This website has more information on Eliquis  (apixaban ): [email or web address] .[email or web address] dena

## 2024-03-17 NOTE — Discharge Summary (Signed)
 Selena [name], is a 73 y.o. female  DOB November 20, 1950  MRN [id number].  Admission date:  03/16/2024  Admitting Physician  No admitting provider for patient encounter.  Discharge Date:  03/17/2024   Primary MD  Sheryle Carwin, MD  Recommendations for primary care physician for things to follow:  1)Watch for bleeding while on Blood Thinners--watch for blood in your stool which can make your stool black, maroon, mahogany or red---, blood in your urine which can make your urine pink or red, nosebleeds , also watch for possible bruising -You are taking Apixaban /Eliquis --- which is a blood thinner--- be careful to avoid injury or falls  2)Avoid ibuprofen/Advil/Aleve/Motrin/Goody Powders/Naproxen/BC powders/Meloxicam/Diclofenac /Indomethacin and other Nonsteroidal anti-inflammatory medications as these will make you more likely to bleed and can cause stomach ulcers, can also cause Kidney problems.   3)Please Take Metoprolol  50 mg along with metoprolol  25 mg twice daily for total of 75 mg twice daily  Admission Diagnosis  Atrial fibrillation (HCC) [I48.91] Paroxysmal atrial fibrillation (HCC) [I48.0] Atrial fibrillation with rapid ventricular response (HCC) [I48.91]   Discharge Diagnosis  Atrial fibrillation (HCC) [I48.91] Paroxysmal atrial fibrillation (HCC) [I48.0] Atrial fibrillation with rapid ventricular response (HCC) [I48.91]    Principal Problem:   Atrial fibrillation (HCC) Active Problems:   GERD (gastroesophageal reflux disease)   HTN (hypertension)   Depression   Hyperlipemia   Hypothyroidism   Chronic back pain   CAD (coronary artery disease)   Insomnia   Opioid dependence (HCC)   Therapeutic opioid induced constipation      Past Medical History:  Diagnosis Date   Abnormal findings on imaging of biliary tract 1997-2000   CBD 21mm, Dr Antonio biliary/pancreatic manometry, EUS Dr. Teressa 09/18/07  dilated but otherwise normal extrahepatic duct, no masses   Anemia    Arthritis    Asthma    Bursitis    Cervical ca (HCC) 1984   partial hysterectomy   Chronic abdominal pain    Functional, seen @[facility] , extensive WU here benign    Chronic back pain    On methadone    Constipation    COPD (chronic obstructive pulmonary disease) (HCC)    Degenerative lumbar spinal stenosis 10/2022   Depression    w/ menopause   Dyspnea    with exertion   Essential hypertension    GERD (gastroesophageal reflux disease)    H/O hiatal hernia    Headache    Hx of migraines   Hemorrhoids    Hyperlipemia    Hypothyroidism    MI (myocardial infarction) (HCC) 2000   Details not clear   Opioid dependence (HCC)    morphine    Osteoarthritis    Palpitations    PVCs   Pneumonia    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 Leslee sec to tortuous colon, followed by [facility] & flex sig normal   Skin cancer    Nose   TIA (transient ischemic attack) 1983    Past Surgical  History:  Procedure Laterality Date   ABDOMINAL EXPOSURE N/A 10/05/2019   Procedure: ABDOMINAL EXPOSURE;  Surgeon: Gretta Lonni PARAS, MD;  Location: [facility] OR;  Service: Vascular;  Laterality: N/A;   ABDOMINAL HYSTERECTOMY     ANTERIOR CERVICAL DECOMP/DISCECTOMY FUSION N/A 04/15/2023   Procedure: Anterior Cervical Decompression Fusion  Cervical five-Cervical six - Cervical six-Cervical seven;  Surgeon: Onetha Kuba, MD;  Location[facility] OR;  Service: Neurosurgery;  Laterality: N/A;   ANTERIOR LUMBAR FUSION N/A 10/05/2019   Procedure: Anterior Lumbar Interbody Fusion - Lumbar Five-Sacral One;  Surgeon: Onetha Kuba, MD;  Location: [facility] OR;  Service: Neurosurgery;  Laterality: N/A;  Anterior Lumbar Interbody Fusion - Lumbar Five-Sacral One   APPENDECTOMY     arthroscopic knee     BACK SURGERY     BIOPSY  12/18/2021   Procedure: BIOPSY;  Surgeon:  Cindie Carlin POUR, DO;  Location: [facility];  Service: Endoscopy;;   CARDIAC CATHETERIZATION  2005   CATARACT EXTRACTION W/PHACO Right 09/02/2014   Procedure: CATARACT EXTRACTION PHACO AND INTRAOCULAR LENS PLACEMENT RIGHT EYE;  Surgeon: Cherene Mania, MD;  Location: [facility] ORS;  Service: Ophthalmology;  Laterality: Right;  CDE:8.96   CATARACT EXTRACTION W/PHACO Left 09/13/2014   Procedure: CATARACT EXTRACTION PHACO AND INTRAOCULAR LENS PLACEMENT LEFT EYE;  Surgeon: Cherene Mania, MD;  Location: [facility] ORS;  Service: Ophthalmology;  Laterality: Left;  CDE 6.20   CHOLECYSTECTOMY     COLONOSCOPY WITH PROPOFOL  N/A 12/18/2021   Procedure: COLONOSCOPY WITH PROPOFOL ;  Surgeon: Cindie Carlin POUR, DO;  Location: [facility];  Service: Endoscopy;  Laterality: N/A;  12:15pm   ESOPHAGOGASTRODUODENOSCOPY (EGD) WITH PROPOFOL  N/A 12/18/2021   Procedure: ESOPHAGOGASTRODUODENOSCOPY (EGD) WITH PROPOFOL ;  Surgeon: Cindie Carlin POUR, DO;  Location: [facility];  Service: Endoscopy;  Laterality: N/A;   EYE SURGERY Bilateral    cataract removal   INGUINAL HERNIA REPAIR     left   JOINT REPLACEMENT Bilateral    knees   LEFT HEART CATH AND CORONARY ANGIOGRAPHY N/A 10/19/2020   Procedure: LEFT HEART CATH AND CORONARY ANGIOGRAPHY;  Surgeon: Claudene Victory ORN, MD;  Location: [facility] INVASIVE CV LAB;  Service: Cardiovascular;  Laterality: N/A;   PARTIAL HYSTERECTOMY     right arm     nerve   TONSILLECTOMY     TOTAL KNEE ARTHROPLASTY  06/20/2012   Procedure: TOTAL KNEE ARTHROPLASTY;  Surgeon: ORN JONETTA Shari Mickey., MD;  Location: [facility] OR;  Service: Orthopedics;  Laterality: Right;   TOTAL KNEE ARTHROPLASTY Left 04/02/2014   Procedure: TOTAL KNEE ARTHROPLASTY;  Surgeon: ORN JONETTA Shari Mickey., MD;  Location: [facility] OR;  Service: Orthopedics;  Laterality: Left;     HPI  from the history and physical done on the day of admission:   HPI: Selena [name] is a 73 y.o. female with medical history significant of HTN, HLD, hypothyroidism, chronic back  pain on multiple opioids, GERD, CAD who was admitted with new onset A-fib with RVR.  She notes increasing shortness of breath and palpitations over the last couple of days.  She checked her home O2 sats and they were noted to be in the 50s.  When she arrived to the ED she had a heart rate in the 130s to 140s.  Cardiology consult was obtained.  The patient was started on Cardizem  and heparin  drips we are asked to admit.  Review of Systems: As mentioned in the history of present illness. All other systems reviewed and are negative.    Hospital Course:  Patient is a 73 year old with history of HTN, HLD, hypothyroidism, chronic back pain on multiple opioids, GERD, CAD who was admitted with new onset A-fib with RVR.  She notes increasing shortness of breath and palpitations over the last couple of days.  She checked her home O2 sats and they were noted to be in the 50s.  When she arrived to the ED she had a heart rate in the 130s to 140s.  Cardiology consult was obtained.  The patient was started on Cardizem  and heparin  drips we are asked to admit.  Assessment and Plan: 1)Atrial flutter with RVR- -converted back to sinus rhythm with IV Cardizem  drip after initially receiving IV Cardizem  bolus  CHA2DS2- VASc score   is = 3 (HTN, gender and age) Which is  equal to = 3.2  % annual risk of stroke  This patients CHA2DS2-VASc Score and unadjusted Ischemic Stroke Rate (% per year) is equal to 3.2 % stroke rate/year from a score of 3 - Treated with IV heparin  okay to transition to Eliquis  for stroke prophylaxis - Cardiology consult appreciated = PTA was on metoprolol  50 mg twice daily okay to change to metoprolol  75 twice daily -Echo with EF of 55 to 50%, mild LVH noted, no diastolic dysfunction, no mitral stenosis, no aortic stenosis, - TSH is 0.628  2)HyPokalemia--- due to chlorthalidone  use - Keep potassium replacement  Opioid dependence (HCC) Continue home opioids  Insomnia Continue  MBM  Chronic back pain Continue her home opioids  Continue muscle relaxer  Hypothyroidism TSH 0.628 -Okay to decrease levothyroxine  to 125 mcg from 137 mcg Continue levothyroxine   Hyperlipemia Continue simvastatin   HTN (hypertension) -Losartan  and metoprolol  adjusted as above #1 - Continue chlorthalidone  with potassium supplementation  GERD (gastroesophageal reflux disease) Continue PPI  Discharge Condition: Stable  Follow UP   Follow-up Information     Dunn, Dayna N, PA-C Follow up on 04/16/2024.   Specialties: Cardiology, Radiology Why: Cardiology Follow-up on 04/16/2024 at 3:30 PM. The office will mail you a monitor to wear in the interim. If any issues with placing this, please call the office. Contact information: [address] [place] [address] [phone]                 Consults obtained - cardiology  Diet and Activity recommendation:  As advised  Discharge Instructions     Discharge Instructions     Amb referral to [facility]   Complete by: As directed    Call MD for:  difficulty breathing, headache or visual disturbances   Complete by: As directed    Call MD for:  persistant dizziness or light-headedness   Complete by: As directed    Call MD for:  temperature >100.4   Complete by: As directed    Diet - low sodium heart healthy   Complete by: As directed    Discharge instructions   Complete by: As directed    1)Watch for bleeding while on Blood Thinners--watch for blood in your stool which can make your stool black, maroon, mahogany or red---, blood in your urine which can make your urine pink or red, nosebleeds , also watch for possible bruising -You are taking Apixaban /Eliquis --- which is a blood thinner--- be careful to avoid injury or falls  2)Avoid ibuprofen/Advil/Aleve/Motrin/Goody Powders/Naproxen/BC powders/Meloxicam/Diclofenac /Indomethacin and other Nonsteroidal anti-inflammatory medications as these will make you more likely to  bleed and can cause stomach ulcers, can also cause Kidney problems.   3)Please Take Metoprolol  50 mg along with metoprolol   25 mg twice daily for total of 75 mg twice daily   Increase activity slowly   Complete by: As directed        Discharge Medications     Allergies as of 03/17/2024       Reactions   Iohexol  Shortness Of Breath, Swelling    Desc: Pt states she had Cardia Cath around 2000/ experienced SOB and swelling of upper body during cath procedure/ tsf   Nubain [nalbuphine Hcl] Shortness Of Breath, Other (See Comments)   ??  DOSE RELATED  ??   Penicillins Itching, Rash, Other (See Comments)   Iodinated Contrast Media Swelling   Latex Rash        Medication List     STOP taking these medications    aspirin  EC 81 MG tablet   Klor-Con  M10 10 MEQ tablet Generic drug: potassium chloride    metroNIDAZOLE  500 MG tablet Commonly known as: FLAGYL        TAKE these medications    albuterol  108 (90 Base) MCG/ACT inhaler Commonly known as: VENTOLIN  HFA Inhale 2 puffs into the lungs every 4 (four) hours as needed for wheezing or shortness of breath. What changed: when to take this   apixaban  5 MG Tabs tablet Commonly known as: ELIQUIS  Take 1 tablet (5 mg total) by mouth 2 (two) times daily.   betamethasone dipropionate 0.05 % cream Apply 1 Application topically as needed (inflimation in legs).   chlorthalidone  25 MG tablet Commonly known as: HYGROTON  Take 25 mg by mouth daily.   clobetasol  0.05 % external solution Commonly known as: TEMOVATE  Apply 1 application  topically 2 (two) times daily. After washing hair   cyclobenzaprine  10 MG tablet Commonly known as: FLEXERIL  Take 1 tablet (10 mg total) by mouth 3 (three) times daily as needed for muscle spasms.   desonide 0.05 % cream Commonly known as: DESOWEN Apply 1 application  topically as needed (for psoriasis on skin).   diclofenac  sodium 1 % Gel Commonly known as: VOLTAREN  Apply 1 Application  topically as needed (for knee pain/arthritis).   ketoconazole 2 % cream Commonly known as: NIZORAL Apply 1 Application topically as needed (rash).   levothyroxine  125 MCG tablet Commonly known as: SYNTHROID  Take 1 tablet (125 mcg total) by mouth daily before breakfast. What changed:  medication strength how much to take   losartan  50 MG tablet Commonly known as: COZAAR  Take 1 tablet (50 mg total) by mouth daily. What changed:  medication strength how much to take   lubiprostone  24 MCG capsule Commonly known as: AMITIZA  Take 1 capsule (24 mcg total) by mouth 2 (two) times daily with a meal.   metoprolol  tartrate 50 MG tablet Commonly known as: LOPRESSOR  Take 1 tablet (50 mg total) by mouth 2 (two) times daily. Take along with metoprolol  25 mg twice daily for total of 75 mg twice daily What changed: additional instructions   metoprolol  tartrate 25 MG tablet Commonly known as: LOPRESSOR  Take 1 tablet (25 mg total) by mouth 2 (two) times daily. Take along with metoprolol  50 mg twice daily for total of 75 mg twice daily What changed: You were already taking a medication with the same name, and this prescription was added. Make sure you understand how and when to take each.   morphine  30 MG 12 hr tablet Commonly known as: MS CONTIN  Take 30 mg by mouth every 12 (twelve) hours.   morphine  15 MG tablet Commonly known as: MSIR Take 15 mg by mouth 3 (three) times  daily as needed for severe pain (break through pain).   nitroGLYCERIN  0.4 MG SL tablet Commonly known as: NITROSTAT  Place 0.4 mg under the tongue every 5 (five) minutes as needed for chest pain.   omeprazole 40 MG capsule Commonly known as: PRILOSEC Take 40 mg by mouth daily.   Percocet 10-325 MG tablet Generic drug: oxyCODONE -acetaminophen  Take 1 tablet by mouth 2 (two) times daily as needed for pain.   potassium chloride  10 MEQ tablet Commonly known as: KLOR-CON  Take 1 tablet (10 mEq total) by mouth daily. Take  While taking chlorthalidone    simvastatin  20 MG tablet Commonly known as: ZOCOR  Take 20 mg by mouth daily.   zolpidem  5 MG tablet Commonly known as: AMBIEN  Take 5 mg by mouth at bedtime.        Major procedures and Radiology Reports - PLEASE review detailed and final reports for all details, in brief -   ECHOCARDIOGRAM COMPLETE Result Date: 03/17/2024    ECHOCARDIOGRAM REPORT   Patient Name:   Selena [name] Date of Exam: 03/17/2024 Medical Rec #:  [id number]       Height:       65.0 in Accession #:    [id number]      Weight:       196.9 lb Date of Birth:  January 17, 1951       BSA:          1.965 m Patient Age:    73 years        BP:           145/69 mmHg Patient Gender: F               HR:           67 bpm. Exam Location:  [facility] Procedure: 2D Echo, Cardiac Doppler, Color Doppler and Intracardiac            Opacification Agent (Both Spectral and Color Flow Doppler were            utilized during procedure). Indications:    Arrhythmia  History:        Patient has prior history of Echocardiogram examinations, most                 recent 07/18/2017. Previous Myocardial Infarction, COPD; Risk                 Factors:Hypertension.  Sonographer:    Jayson Gaskins Referring Phys: [id number] DORN FALCON BRANCH IMPRESSIONS  1. Left ventricular ejection fraction, by estimation, is 55 to 60%. The left ventricle has normal function. Left ventricular endocardial border not optimally defined to evaluate regional wall motion. There is mild left ventricular hypertrophy. Left ventricular diastolic parameters were normal.  2. Right ventricular systolic function is normal. The right ventricular size is normal. Tricuspid regurgitation signal is inadequate for assessing PA pressure.  3. The mitral valve is normal in structure. No evidence of mitral valve regurgitation. No evidence of mitral stenosis.  4. The aortic valve was not well visualized. Aortic valve regurgitation is trivial. No aortic stenosis is present.  5. The  inferior vena cava is normal in size with greater than 50% respiratory variability, suggesting right atrial pressure of 3 mmHg. FINDINGS  Left Ventricle: Left ventricular ejection fraction, by estimation, is 55 to 60%. The left ventricle has normal function. Left ventricular endocardial border not optimally defined to evaluate regional wall motion. The left ventricular internal cavity size was normal in size. There is mild left ventricular  hypertrophy. Left ventricular diastolic parameters were normal. Normal left ventricular filling pressure. Right Ventricle: The right ventricular size is normal. Right vetricular wall thickness was not well visualized. Right ventricular systolic function is normal. Tricuspid regurgitation signal is inadequate for assessing PA pressure. Left Atrium: Left atrial size was normal in size. Right Atrium: Right atrial size was normal in size. Pericardium: There is no evidence of pericardial effusion. Mitral Valve: The mitral valve is normal in structure. No evidence of mitral valve regurgitation. No evidence of mitral valve stenosis. Tricuspid Valve: The tricuspid valve is normal in structure. Tricuspid valve regurgitation is trivial. No evidence of tricuspid stenosis. Aortic Valve: The aortic valve was not well visualized. Aortic valve regurgitation is trivial. No aortic stenosis is present. Aortic valve mean gradient measures 4.0 mmHg. Aortic valve peak gradient measures 8.5 mmHg. Aortic valve area, by VTI measures 1.68 cm. Pulmonic Valve: The pulmonic valve was not well visualized. Pulmonic valve regurgitation is not visualized. No evidence of pulmonic stenosis. Aorta: The aortic root and ascending aorta are structurally normal, with no evidence of dilitation. Venous: The inferior vena cava is normal in size with greater than 50% respiratory variability, suggesting right atrial pressure of 3 mmHg. IAS/Shunts: No atrial level shunt detected by color flow Doppler.  LEFT VENTRICLE PLAX  2D LVIDd:         4.70 cm LVIDs:         2.90 cm LV PW:         1.20 cm LV IVS:        1.20 cm LVOT diam:     1.90 cm LV SV:         59 LV SV Index:   30 LVOT Area:     2.84 cm  RIGHT VENTRICLE RV S prime:     13.20 cm/s TAPSE (M-mode): 2.4 cm LEFT ATRIUM             Index        RIGHT ATRIUM           Index LA Vol (A2C):   61.7 ml 31.40 ml/m  RA Area:     19.00 cm LA Vol (A4C):   45.4 ml 23.11 ml/m  RA Volume:   55.40 ml  28.19 ml/m LA Biplane Vol: 52.3 ml 26.62 ml/m  AORTIC VALVE AV Area (Vmax):    1.72 cm AV Area (Vmean):   1.96 cm AV Area (VTI):     1.68 cm AV Vmax:           146.00 cm/s AV Vmean:          97.600 cm/s AV VTI:            0.352 m AV Peak Grad:      8.5 mmHg AV Mean Grad:      4.0 mmHg LVOT Vmax:         88.50 cm/s LVOT Vmean:        67.600 cm/s LVOT VTI:          0.208 m LVOT/AV VTI ratio: 0.59  AORTA Ao Root diam: 3.30 cm Ao Asc diam:  3.00 cm MITRAL VALVE MV Area (PHT): 2.57 cm    SHUNTS MV Decel Time: 295 msec    Systemic VTI:  0.21 m MV E velocity: 79.90 cm/s  Systemic Diam: 1.90 cm MV A velocity: 73.90 cm/s MV E/A ratio:  1.08 Dorn Ross MD Electronically signed by Dorn Ross MD Signature Date/Time: 03/17/2024/11:20:46 AM    Final  DG Chest Portable 1 View Result Date: 03/16/2024 CLINICAL DATA:  Shortness of breath. EXAM: PORTABLE CHEST 1 VIEW COMPARISON:  October 01, 2023 FINDINGS: The cardiac silhouette is mildly enlarged. This may be technical in origin. Mild, diffuse, chronic appearing increased interstitial lung markings are seen. Mild atelectasis and/or infiltrate is present within the left lung base. No pleural effusion or pneumothorax is identified. Postoperative changes are seen within the lower lumbar spine with multilevel degenerative changes present throughout the thoracic spine. IMPRESSION: Mild left basilar atelectasis and/or infiltrate. Electronically Signed   By: Suzen Dials M.D.   On: 03/16/2024 13:50   Micro Results  Recent Results (from the  past 240 hours)  MRSA Next Gen by PCR, Nasal     Status: None   Collection Time: 03/16/24  5:00 PM   Specimen: Nasal Mucosa; Nasal Swab  Result Value Ref Range Status   MRSA by PCR Next Gen NOT DETECTED NOT DETECTED Final    Comment: (NOTE) The GeneXpert MRSA Assay (FDA approved for NASAL specimens only), is one component of a comprehensive MRSA colonization surveillance program. It is not intended to diagnose MRSA infection nor to guide or monitor treatment for MRSA infections. Test performance is not FDA approved in patients less than 24 years old. Performed at [facility], [address] [place], [place] [address]    Today   Subjective    Selena [name] today has no new complaints -- Husband at bedside, questions answered   - Patient ambulated around the unit on room air with O2 sats of 93 to 94% on room air post ambulation        Patient has been seen and examined prior to discharge   Objective   Blood pressure 102/73, pulse (!) 50, temperature 97.7 F (36.5 C), temperature source Oral, resp. rate 17, height 5' 5 (1.651 m), weight 89.3 kg, SpO2 96%.   Intake/Output Summary (Last 24 hours) at 03/17/2024 1335 Last data filed at 03/17/2024 1312 Gross per 24 hour  Intake 561.6 ml  Output --  Net 561.6 ml   Exam Gen:- Awake Alert, no acute distress  HEENT:- [place].AT, No sclera icterus Neck-Supple Neck,No JVD,.  Lungs-  CTAB , good air movement bilaterally CV- S1, S2 normal, regular Abd-  +ve B.Sounds, Abd Soft, No tenderness,    Extremity/Skin:- No  edema,   good pulses Psych-affect is appropriate, oriented x3 Neuro-no new focal deficits, no tremors    Data Review   CBC w Diff:  Lab Results  Component Value Date   WBC 7.4 03/17/2024   HGB 14.0 03/17/2024   HCT 42.8 03/17/2024   PLT 241 03/17/2024   LYMPHOPCT 30 03/16/2024   MONOPCT 11 03/16/2024   EOSPCT 4 03/16/2024   BASOPCT 1 03/16/2024   CMP:  Lab Results  Component Value Date   NA 139 03/17/2024   K  3.4 (L) 03/17/2024   CL 106 03/17/2024   CO2 25 03/17/2024   BUN 16 03/17/2024   CREATININE 0.61 03/17/2024   PROT 7.0 03/16/2024   ALBUMIN  3.7 03/16/2024   BILITOT 0.8 03/16/2024   ALKPHOS 91 03/16/2024   AST 28 03/16/2024   ALT 22 03/16/2024   Total Discharge time is about 33 minutes  Rendall Carwin M.D on 03/17/2024 at 1:35 PM  Go to [email or web address] -  for contact info  [facility] - Office  [phone]

## 2024-03-17 NOTE — Telephone Encounter (Signed)
-----   Message from Laymon CHRISTELLA Qua sent at 03/17/2024  9:59 AM EDT ----- Regarding: 2-week Zio patch This patient needs a 2-week Zio patch for atrial flutter per Dr. Alvan. She is a patient of Dr. Debera.   Thanks,  [place]

## 2024-03-17 NOTE — Progress Notes (Signed)
 Rounding Note   Patient Name: Selena [name] Date of Encounter: 03/17/2024  [facility] Cardiologist: Jayson Sierras, MD   Subjective No complaints  Scheduled Meds:  aspirin  EC  81 mg Oral Daily   Chlorhexidine  Gluconate Cloth  6 each Topical Q0600   chlorthalidone   25 mg Oral Daily   dicyclomine   20 mg Oral BID   levothyroxine   137 mcg Oral QAC breakfast   losartan   100 mg Oral Daily   lubiprostone   24 mcg Oral BID WC   metoprolol  tartrate  37.5 mg Oral Q6H   morphine   30 mg Oral Q12H   pantoprazole   80 mg Oral Daily   potassium chloride   10 mEq Oral BID   potassium chloride   40 mEq Oral Q3H   simvastatin   20 mg Oral Daily   sodium chloride  flush  3 mL Intravenous Q12H   zolpidem   5 mg Oral QHS   Continuous Infusions:  sodium chloride      diltiazem  (CARDIZEM ) infusion Stopped (03/17/24 0114)   heparin  950 Units/hr (03/16/24 1701)   PRN Meds: sodium chloride , albuterol , cyclobenzaprine , diclofenac  Sodium, metoprolol  tartrate, morphine , nitroGLYCERIN , ondansetron  **OR** ondansetron  (ZOFRAN ) IV, oxyCODONE -acetaminophen  **AND** oxyCODONE , polyethylene glycol, sodium chloride  flush   Vital Signs  Vitals:   03/17/24 0744 03/17/24 0810 03/17/24 0830 03/17/24 0835  BP: (!) 145/69     Pulse: 74  84   Resp:    18  Temp:      TempSrc:      SpO2:  97%    Weight:      Height:        Intake/Output Summary (Last 24 hours) at 03/17/2024 0851 Last data filed at 03/16/2024 2000 Gross per 24 hour  Intake 286.01 ml  Output --  Net 286.01 ml      03/16/2024    4:50 PM 03/16/2024   12:16 PM 11/28/2023    3:01 PM  Last 3 Weights  Weight (lbs) 196 lb 13.9 oz 195 lb 192 lb  Weight (kg) 89.3 kg 88.451 kg 87.091 kg      Telemetry SR, PACs - Personally Reviewed  ECG  N/a - Personally Reviewed  Physical Exam  GEN: No acute distress.   Neck: No JVD Cardiac: RRR, no murmurs, rubs, or gallops.  Respiratory: Clear to auscultation bilaterally. GI: Soft,  nontender, non-distended  MS: No edema; No deformity. Neuro:  Nonfocal  Psych: Normal affect   Labs High Sensitivity Troponin:   Recent Labs  Lab 03/16/24 1238 03/16/24 1454  TROPONINIHS 8 7     Chemistry Recent Labs  Lab 03/16/24 1238 03/16/24 1506 03/17/24 0459  NA 140  --  139  K 3.7  --  3.4*  CL 105  --  106  CO2 24  --  25  GLUCOSE 102*  --  99  BUN 18  --  16  CREATININE 0.76  --  0.61  CALCIUM 9.5  --  9.3  MG  --  1.7  --   PROT 7.0  --   --   ALBUMIN  3.7  --   --   AST 28  --   --   ALT 22  --   --   ALKPHOS 91  --   --   BILITOT 0.8  --   --   GFRNONAA >60  --  >60  ANIONGAP 11  --  8    Lipids No results for input(s): CHOL, TRIG, HDL, LABVLDL, LDLCALC, CHOLHDL in the last 168  hours.  Hematology Recent Labs  Lab 03/16/24 1238 03/17/24 0459  WBC 8.9 7.4  RBC 5.26* 4.88  HGB 15.6* 14.0  HCT 45.6 42.8  MCV 86.7 87.7  MCH 29.7 28.7  MCHC 34.2 32.7  RDW 13.7 13.8  PLT 256 241   Thyroid   Recent Labs  Lab 03/17/24 0459  TSH 0.628    BNPNo results for input(s): BNP, PROBNP in the last 168 hours.  DDimer No results for input(s): DDIMER in the last 168 hours.   Radiology  DG Chest Portable 1 View Result Date: 03/16/2024 CLINICAL DATA:  Shortness of breath. EXAM: PORTABLE CHEST 1 VIEW COMPARISON:  October 01, 2023 FINDINGS: The cardiac silhouette is mildly enlarged. This may be technical in origin. Mild, diffuse, chronic appearing increased interstitial lung markings are seen. Mild atelectasis and/or infiltrate is present within the left lung base. No pleural effusion or pneumothorax is identified. Postoperative changes are seen within the lower lumbar spine with multilevel degenerative changes present throughout the thoracic spine. IMPRESSION: Mild left basilar atelectasis and/or infiltrate. Electronically Signed   By: Suzen Dials M.D.   On: 03/16/2024 13:50     Patient Profile   Selena [name] is a 73 y.o. female with a  hx of PVCs, HTN, HLD who is being seen 03/16/2024 for the evaluation of SOB  at the request of Dr Chrissie.   Assessment & Plan  1.Aflutter - new diagnosis this admission, presented with aflutter with RVR - received IV diltiazem  20mg  x 1, now on drip at 5mg /hr. Bp 110s/60s - converted on IV diltiazem  to NSR. On lopressor  50mg  bid at home, change to lopressor  37.5mg  q 6 hours - CHADS2Vasc score is at least 3(HTN, age x 1, gender), started on hep initially likely can transition to eliquis  in AM.  - echo pending - plan for 2 week zio patch to monitor for aflutter recurrence and burden, if uncontrolled would refer to EP to consider ablation for typical aflutter   2.HTN - some low bp's at times overnight, hypertensive this AM - we increased lopressor  as listed above, lower home losartan  to 50mg  daily.   Likely discharge later today after echo and pending HRs   For questions or updates, please contact [facility] Please consult [email or web address] for contact info under     Signed, Alvan Carrier, MD  03/17/2024, 8:51 AM

## 2024-03-17 NOTE — Progress Notes (Signed)
 PHARMACY - ANTICOAGULATION CONSULT NOTE  Pharmacy Consult for heparin  Indication: atrial fibrillation  Allergies  Allergen Reactions   Iohexol  Shortness Of Breath and Swelling     Desc: Pt states she had Cardia Cath around 2000/ experienced SOB and swelling of upper body during cath procedure/ tsf    Nubain [Nalbuphine Hcl] Shortness Of Breath and Other (See Comments)    ??  DOSE RELATED  ??   Penicillins Itching, Rash and Other (See Comments)   Iodinated Contrast Media Swelling   Latex Rash    Patient Measurements: Height: 5' 5 (165.1 cm) Weight: 89.3 kg (196 lb 13.9 oz) IBW/kg (Calculated) : 57 HEPARIN  DW (KG): 76.7  Vital Signs: Temp: 98.2 F (36.8 C) (08/11 2000) Temp Source: Axillary (08/11 2000) BP: 143/122 (08/11 2000) Pulse Rate: 70 (08/11 2000)  Labs: Recent Labs    03/16/24 1238 03/16/24 1454 03/16/24 2337  HGB 15.6*  --   --   HCT 45.6  --   --   PLT 256  --   --   HEPARINUNFRC  --   --  0.39  CREATININE 0.76  --   --   TROPONINIHS 8 7  --     Estimated Creatinine Clearance: 70.1 mL/min (by C-G formula based on SCr of 0.76 mg/dL).   Medical History: Past Medical History:  Diagnosis Date   Abnormal findings on imaging of biliary tract 1997-2000   CBD 21mm, Dr Antonio biliary/pancreatic manometry, EUS Dr. Teressa 09/18/07 dilated but otherwise normal extrahepatic duct, no masses   Anemia    Arthritis    Asthma    Bursitis    Cervical ca (HCC) 1984   partial hysterectomy   Chronic abdominal pain    Functional, seen @[facility] , extensive WU here benign    Chronic back pain    On methadone    Constipation    COPD (chronic obstructive pulmonary disease) (HCC)    Degenerative lumbar spinal stenosis 10/2022   Depression    w/ menopause   Dyspnea    with exertion   Essential hypertension    GERD (gastroesophageal reflux disease)    H/O hiatal hernia    Headache    Hx of migraines   Hemorrhoids    Hyperlipemia    Hypothyroidism    MI (myocardial  infarction) (HCC) 2000   Details not clear   Opioid dependence (HCC)    morphine    Osteoarthritis    Palpitations    PVCs   Pneumonia    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 Leslee sec to tortuous colon, followed by [facility] & flex sig normal   Skin cancer    Nose   TIA (transient ischemic attack) 1983    Medications:  Medications Prior to Admission  Medication Sig Dispense Refill Last Dose/Taking   albuterol  (PROVENTIL  HFA;VENTOLIN  HFA) 108 (90 Base) MCG/ACT inhaler Inhale 2 puffs into the lungs every 4 (four) hours as needed for wheezing or shortness of breath. (Patient taking differently: Inhale 2 puffs into the lungs as needed for wheezing or shortness of breath.) 1 Inhaler 0 Past Week   aspirin  EC 81 MG tablet Take 1 tablet (81 mg total) by mouth daily. 90 tablet 3 03/15/2024   betamethasone dipropionate 0.05 % cream Apply 1 Application topically as needed (inflimation in legs).   Past Month   chlorthalidone  (HYGROTON ) 25 MG tablet Take 25 mg by mouth daily.   03/16/2024  Morning   clobetasol  (TEMOVATE ) 0.05 % external solution Apply 1 application  topically 2 (two) times daily. After washing hair  3 Past Month   cyclobenzaprine  (FLEXERIL ) 10 MG tablet Take 1 tablet (10 mg total) by mouth 3 (three) times daily as needed for muscle spasms. 30 tablet 0 03/16/2024   desonide (DESOWEN) 0.05 % cream Apply 1 application  topically as needed (for psoriasis on skin).  3 Past Month   diclofenac  sodium (VOLTAREN ) 1 % GEL Apply 1 Application topically as needed (for knee pain/arthritis).   Past Month   ketoconazole (NIZORAL) 2 % cream Apply 1 Application topically as needed (rash).   Past Month   KLOR-CON  M10 10 MEQ tablet Take 10 mEq by mouth 2 (two) times daily.   03/16/2024 Morning   levothyroxine  (SYNTHROID ) 137 MCG tablet Take 137 mcg by mouth daily before breakfast.   03/16/2024 Morning    losartan  (COZAAR ) 100 MG tablet Take 100 mg by mouth daily.   03/16/2024   lubiprostone  (AMITIZA ) 24 MCG capsule Take 1 capsule (24 mcg total) by mouth 2 (two) times daily with a meal. 180 capsule 1 03/16/2024   metoprolol  tartrate (LOPRESSOR ) 50 MG tablet Take 1 tablet (50 mg total) by mouth 2 (two) times daily. 180 tablet 3 03/16/2024 at  6:00 AM   morphine  (MS CONTIN ) 30 MG 12 hr tablet Take 30 mg by mouth every 12 (twelve) hours.   03/16/2024 at  6:00 AM   morphine  (MSIR) 15 MG tablet Take 15 mg by mouth 3 (three) times daily as needed for severe pain (break through pain).   Past Week   nitroGLYCERIN  (NITROSTAT ) 0.4 MG SL tablet Place 0.4 mg under the tongue every 5 (five) minutes as needed for chest pain.   Unknown   omeprazole (PRILOSEC) 40 MG capsule Take 40 mg by mouth daily.   03/16/2024 Morning   PERCOCET 10-325 MG tablet Take 1 tablet by mouth 2 (two) times daily as needed for pain.   03/16/2024 Morning   simvastatin  (ZOCOR ) 20 MG tablet Take 20 mg by mouth daily.   03/15/2024 Bedtime   zolpidem  (AMBIEN ) 5 MG tablet Take 5 mg by mouth at bedtime.   03/15/2024 Bedtime   metroNIDAZOLE  (FLAGYL ) 500 MG tablet Take 1 tablet (500 mg total) by mouth 2 (two) times daily. 14 tablet 0     Assessment: Pharmacy consulted to dose heparin  in patient with atrial fibrillation.  Patient is not on anticoagulation prior to admission.  8/12 AM update:  Heparin  level therapeutic  Goal of Therapy:  Heparin  level 0.3-0.7 units/ml Monitor platelets by anticoagulation protocol: Yes   Plan:  Cont heparin  950 units/hr Heparin  level with AM labs  Lynwood Mckusick, PharmD, BCPS Clinical Pharmacist Phone: [phone]

## 2024-03-17 NOTE — Telephone Encounter (Signed)
Patient Product/process development scientist completed.    The patient is insured through [facility]. Patient has [employer], may use a copay card, and/or apply for patient assistance if available.    Ran test claim for Eliquis 5 mg and the current 30 day co-pay is $0.00.   This test claim was processed through [facility]- copay amounts may vary at other pharmacies due to pharmacy/plan contracts, or as the patient moves through the different stages of their insurance plan.     Roland Earl, CPHT Pharmacy Technician III Certified Patient Advocate [facility] Patient Advocate Team Direct Number: [phone]  Fax: [phone]

## 2024-03-17 NOTE — Plan of Care (Signed)
  Problem: Education: Goal: Knowledge of General Education information will improve Description: Including pain rating scale, medication(s)/side effects and non-pharmacologic comfort measures Outcome: Progressing   Problem: Health Behavior/Discharge Planning: Goal: Ability to manage health-related needs will improve Outcome: Progressing   Problem: Clinical Measurements: Goal: Ability to maintain clinical measurements within normal limits will improve Outcome: Progressing Goal: Will remain free from infection Outcome: Progressing Goal: Diagnostic test results will improve Outcome: Progressing Goal: Respiratory complications will improve Outcome: Progressing Goal: Cardiovascular complication will be avoided Outcome: Progressing   Problem: Activity: Goal: Risk for activity intolerance will decrease Outcome: Progressing   Problem: Nutrition: Goal: Adequate nutrition will be maintained Outcome: Progressing   Problem: Pain Managment: Goal: General experience of comfort will improve and/or be controlled Outcome: Progressing   Problem: Elimination: Goal: Will not experience complications related to bowel motility Outcome: Progressing Goal: Will not experience complications related to urinary retention Outcome: Progressing   Problem: Skin Integrity: Goal: Risk for impaired skin integrity will decrease Outcome: Progressing   Problem: Education: Goal: Knowledge of disease or condition will improve Outcome: Progressing Goal: Understanding of medication regimen will improve Outcome: Progressing Goal: Individualized Educational Video(s) Outcome: Progressing   Problem: Cardiac: Goal: Ability to achieve and maintain adequate cardiopulmonary perfusion will improve Outcome: Progressing   Problem: Activity: Goal: Ability to tolerate increased activity will improve Outcome: Progressing   Problem: Health Behavior/Discharge Planning: Goal: Ability to safely manage health-related  needs after discharge will improve Outcome: Progressing   Problem: Education: Goal: Knowledge of disease or condition will improve Outcome: Progressing Goal: Understanding of medication regimen will improve Outcome: Progressing Goal: Individualized Educational Video(s) Outcome: Progressing   Problem: Activity: Goal: Ability to tolerate increased activity will improve Outcome: Progressing   Problem: Cardiac: Goal: Ability to achieve and maintain adequate cardiopulmonary perfusion will improve Outcome: Progressing   Problem: Health Behavior/Discharge Planning: Goal: Ability to safely manage health-related needs after discharge will improve Outcome: Progressing

## 2024-03-22 ENCOUNTER — Other Ambulatory Visit: Payer: Self-pay | Admitting: Gastroenterology

## 2024-03-22 DIAGNOSIS — K5900 Constipation, unspecified: Secondary | ICD-10-CM

## 2024-04-15 NOTE — Progress Notes (Deleted)
   Cardiology Office Note    Date:  04/15/2024  ID:  Selena [name], Selena [name] November 16, 1950, MRN [id number] PCP:  Sheryle Carwin, MD  Cardiologist:  Jayson Sierras, MD  Electrophysiologist:  None   Chief Complaint: ***  History of Present Illness: .    Selena [name] is a 73 y.o. female with visit-pertinent history of PVCs, HTN, HLD managed by PCP, atrial flutter, depression, hypothyroidism, chronic back pain, GERD, seen for post-hospital follow-up. She was previously followed for palpitations felt due to PVCS. Cath in 2022 showed normal coronaries. Recently she was admitted 03/2024 with SOB, found to be in a-flutter RVR, new diagnosis. Echo showed EF 55-60%, mild LVH, otherwise OK. CXR had shown atx vs infiltrate. She was treated with IV diltiazem , converted to NSR, transitioned to oral diltiazem /metoprolol  with Eliquis  and event monitor mailed at discharge. Also started on potassium supplementation. Levothyroxine  dose was decreased slightly by IM team (TSH 0.628).  Hypok, mg Lipids -> PCP not here Fu pcp for TSH  Paroxysmal atrial flutter H/o PVCs Essential HTN Hypothyroidism   Labwork independently reviewed: 03/2024 K 3.4, Cr 0.61, CBC OK, TSH OK, MG 1.7, trops neg, LFTs ok  ROS: .    Please see the history of present illness. Otherwise, review of systems is positive for ***.  All other systems are reviewed and otherwise negative.  Studies Reviewed: SABRA    EKG:  EKG is ordered today, personally reviewed, demonstrating ***  CV Studies: Cardiac studies reviewed are outlined and summarized above. Otherwise please see EMR for full report.   Current Reported Medications:.    No outpatient medications have been marked as taking for the 04/16/24 encounter (Appointment) with Reva Pinkley N, PA-C.    Physical Exam:    VS:  There were no vitals taken for this visit.   Wt Readings from Last 3 Encounters:  03/16/24 196 lb 13.9 oz (89.3 kg)  11/28/23 192 lb (87.1 kg)  11/13/23 180 lb (81.6 kg)     GEN: Well nourished, well developed in no acute distress NECK: No JVD; No carotid bruits CARDIAC: ***RRR, no murmurs, rubs, gallops RESPIRATORY:  Clear to auscultation without rales, wheezing or rhonchi  ABDOMEN: Soft, non-tender, non-distended EXTREMITIES:  No edema; No acute deformity   Asessement and Plan:.     ***     Disposition: F/u with ***  Signed, Koleen Celia N Arlo Buffone, PA-C

## 2024-04-16 ENCOUNTER — Ambulatory Visit: Admitting: Physician Assistant

## 2024-04-16 DIAGNOSIS — I493 Ventricular premature depolarization: Secondary | ICD-10-CM

## 2024-04-16 DIAGNOSIS — I10 Essential (primary) hypertension: Secondary | ICD-10-CM

## 2024-04-16 DIAGNOSIS — E039 Hypothyroidism, unspecified: Secondary | ICD-10-CM

## 2024-04-16 DIAGNOSIS — I4892 Unspecified atrial flutter: Secondary | ICD-10-CM

## 2024-04-21 ENCOUNTER — Ambulatory Visit: Payer: Self-pay | Admitting: Cardiology

## 2024-04-21 ENCOUNTER — Telehealth: Payer: Self-pay | Admitting: Cardiology

## 2024-04-21 DIAGNOSIS — I480 Paroxysmal atrial fibrillation: Secondary | ICD-10-CM | POA: Diagnosis not present

## 2024-04-21 NOTE — Telephone Encounter (Signed)
 [employer] calling to report abnormal results

## 2024-04-21 NOTE — Telephone Encounter (Signed)
 Spoke to [employer] who advised on abnormal cardiac monitor results:  Rapid A. Flutter 203 bpm for 60 seconds Pg. [address] 14  Per providers last note regarding monitor- patient to be referred to Dr. Libbie. Nancey for antiarrythmic.  Pt currently on Eliquis /Lopressor .   Will route to provider as FYI.

## 2024-04-25 ENCOUNTER — Other Ambulatory Visit: Payer: Self-pay | Admitting: General Surgery

## 2024-04-25 DIAGNOSIS — Z91041 Radiographic dye allergy status: Secondary | ICD-10-CM

## 2024-05-04 NOTE — Therapy (Incomplete)
 OUTPATIENT PHYSICAL THERAPY CERVICAL EVALUATION   Patient Name: Selena [name] MRN: [id number] DOB:1951/05/22, 73 y.o., female Today's Date: 05/04/2024  END OF SESSION:   Past Medical History:  Diagnosis Date   Abnormal findings on imaging of biliary tract 1997-2000   CBD 21mm, Dr Antonio biliary/pancreatic manometry, EUS Dr. Teressa 09/18/07 dilated but otherwise normal extrahepatic duct, no masses   Anemia    Arthritis    Asthma    Bursitis    Cervical ca [facility]) 1984   partial hysterectomy   Chronic abdominal pain    Functional, seen @[facility] , extensive WU here benign    Chronic back pain    On methadone    Constipation    COPD (chronic obstructive pulmonary disease) (HCC)    Degenerative lumbar spinal stenosis 10/2022   Depression    w/ menopause   Dyspnea    with exertion   Essential hypertension    GERD (gastroesophageal reflux disease)    H/O hiatal hernia    Headache    Hx of migraines   Hemorrhoids    Hyperlipemia    Hypothyroidism    MI (myocardial infarction) (HCC) 2000   Details not clear   Opioid dependence (HCC)    morphine    Osteoarthritis    Palpitations    PVCs   Pneumonia    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 Leslee sec to tortuous colon, followed by [facility] & flex sig normal   Skin cancer    Nose   TIA (transient ischemic attack) 1983   Past Surgical History:  Procedure Laterality Date   ABDOMINAL EXPOSURE N/A 10/05/2019   Procedure: ABDOMINAL EXPOSURE;  Surgeon: Gretta Lonni PARAS, MD;  Location: [facility] OR;  Service: Vascular;  Laterality: N/A;   ABDOMINAL HYSTERECTOMY     ANTERIOR CERVICAL DECOMP/DISCECTOMY FUSION N/A 04/15/2023   Procedure: Anterior Cervical Decompression Fusion  Cervical five-Cervical six - Cervical six-Cervical seven;  Surgeon: Onetha Kuba, MD;  Location: [facility] OR;  Service: Neurosurgery;  Laterality: N/A;   ANTERIOR LUMBAR  FUSION N/A 10/05/2019   Procedure: Anterior Lumbar Interbody Fusion - Lumbar Five-Sacral One;  Surgeon: Onetha Kuba, MD;  Location: [facility] OR;  Service: Neurosurgery;  Laterality: N/A;  Anterior Lumbar Interbody Fusion - Lumbar Five-Sacral One   APPENDECTOMY     arthroscopic knee     BACK SURGERY     BIOPSY  12/18/2021   Procedure: BIOPSY;  Surgeon: Cindie Carlin POUR, DO;  [facility]: [facility];  Service: Endoscopy;;   CARDIAC CATHETERIZATION  2005   CATARACT EXTRACTION W/PHACO Right 09/02/2014   Procedure: CATARACT EXTRACTION PHACO AND INTRAOCULAR LENS PLACEMENT RIGHT EYE;  Surgeon: Cherene Mania, MD;  Location: [facility] ORS;  Service: Ophthalmology;  Laterality: Right;  CDE:8.96   CATARACT EXTRACTION W/PHACO Left 09/13/2014   Procedure: CATARACT EXTRACTION PHACO AND INTRAOCULAR LENS PLACEMENT LEFT EYE;  Surgeon: Cherene Mania, MD;  Location: [facility] ORS;  Service: Ophthalmology;  Laterality: Left;  CDE 6.20   CHOLECYSTECTOMY     COLONOSCOPY WITH PROPOFOL  N/A 12/18/2021   Procedure: COLONOSCOPY WITH PROPOFOL ;  Surgeon: Cindie Carlin POUR, DO;  Location: [facility];  Service: Endoscopy;  Laterality: N/A;  12:15pm   ESOPHAGOGASTRODUODENOSCOPY (EGD) WITH PROPOFOL  N/A 12/18/2021   Procedure: ESOPHAGOGASTRODUODENOSCOPY (EGD) WITH PROPOFOL ;  Surgeon: Cindie Carlin POUR, DO;  Location: [facility];  Service: Endoscopy;  Laterality: N/A;   EYE SURGERY Bilateral    cataract removal  INGUINAL HERNIA REPAIR     left   JOINT REPLACEMENT Bilateral    knees   LEFT HEART CATH AND CORONARY ANGIOGRAPHY N/A 10/19/2020   Procedure: LEFT HEART CATH AND CORONARY ANGIOGRAPHY;  Surgeon: Claudene Victory ORN, MD;  Location: [facility] INVASIVE CV LAB;  Service: Cardiovascular;  Laterality: N/A;   PARTIAL HYSTERECTOMY     right arm     nerve   TONSILLECTOMY     TOTAL KNEE ARTHROPLASTY  06/20/2012   Procedure: TOTAL KNEE ARTHROPLASTY;  Surgeon: ORN JONETTA Shari Mickey., MD;  Location: [facility] OR;  Service: Orthopedics;  Laterality: Right;   TOTAL KNEE  ARTHROPLASTY Left 04/02/2014   Procedure: TOTAL KNEE ARTHROPLASTY;  Surgeon: ORN JONETTA Shari Mickey., MD;  Location: [facility] OR;  Service: Orthopedics;  Laterality: Left;   Patient Active Problem List   Diagnosis Date Noted   Atrial fibrillation (HCC) 03/16/2024   Opioid dependence (HCC) 03/16/2024   Lower abdominal pain 11/13/2023   Spinal stenosis in cervical region 04/15/2023   IDA (iron deficiency anemia) 12/14/2020   Constipation    Rectal bleeding    Spondylolisthesis at L5-S1 level 10/05/2019   Therapeutic opioid induced constipation 08/13/2019   Spondylolisthesis at L2-L3 level 09/02/2017   Prolapse of vaginal vault after hysterectomy 05/24/2015   Pelvic pain in female 05/24/2015   Pain in joint, lower leg 04/22/2014   Osteoarthritis of left knee 04/02/2014   Lumbosacral spondylosis 01/10/2013   Insomnia 01/10/2013   Stiffness of joint, not elsewhere classified, lower leg 07/24/2012   Difficulty walking 07/24/2012   DJD (degenerative joint disease) of knee, end stage R knee 06/21/2012   HTN (hypertension)    Depression    Hyperlipemia    Hypothyroidism    Chronic back pain    PUD (peptic ulcer disease)    CAD (coronary artery disease)    Knee pain 01/07/2012   Abnormality of gait 01/07/2012   Muscle weakness (generalized) 01/07/2012   Dysphagia 12/13/2010   Abnormal finding of biliary tract 12/13/2010   GERD (gastroesophageal reflux disease) 12/13/2010    PCP: Sheryle Carwin, MD  REFERRING PROVIDER: Onetha Kuba, MD  REFERRING DIAG: Onetha Kuba, MD  THERAPY DIAG:  No diagnosis found.  Rationale for Evaluation and Treatment: Rehabilitation  ONSET DATE: ***  SUBJECTIVE:                                                                                                                                                                                                         SUBJECTIVE STATEMENT: *** Hand dominance: {MISC; OT HAND DOMINANCE:[id number]}  PERTINENT HISTORY:   ***  PAIN:  Are you having pain? {OPRCPAIN:27236}  PRECAUTIONS: {Therapy precautions:24002}  RED FLAGS: {PT Red Flags:29287}     WEIGHT BEARING RESTRICTIONS: {Yes ***/No:24003}  FALLS:  Has patient fallen in last 6 months? {fallsyesno:27318}  LIVING ENVIRONMENT: Lives with: {OPRC lives with:25569::lives with their family} Lives in: {Lives in:25570} Stairs: {opstairs:27293} Has following equipment at home: {Assistive devices:23999}  OCCUPATION: ***  PLOF: {PLOF:24004}  PATIENT GOALS: ***  NEXT MD VISIT: ***  OBJECTIVE:  Note: Objective measures were completed at Evaluation unless otherwise noted.  DIAGNOSTIC FINDINGS:  ***  PATIENT SURVEYS:  {rehab surveys:24030}  COGNITION: Overall cognitive status: {cognition:24006}  SENSATION: {sensation:27233}  POSTURE: {posture:25561}  PALPATION: ***   CERVICAL ROM:   {AROM/PROM:27142} ROM A/PROM (deg) eval  Flexion   Extension   Right lateral flexion   Left lateral flexion   Right rotation   Left rotation    (Blank rows = not tested)  UPPER EXTREMITY ROM:  {AROM/PROM:27142} ROM Right eval Left eval  Shoulder flexion    Shoulder extension    Shoulder abduction    Shoulder adduction    Shoulder extension    Shoulder internal rotation    Shoulder external rotation    Elbow flexion    Elbow extension    Wrist flexion    Wrist extension    Wrist ulnar deviation    Wrist radial deviation    Wrist pronation    Wrist supination     (Blank rows = not tested)  UPPER EXTREMITY MMT:  MMT Right eval Left eval  Shoulder flexion    Shoulder extension    Shoulder abduction    Shoulder adduction    Shoulder extension    Shoulder internal rotation    Shoulder external rotation    Middle trapezius    Lower trapezius    Elbow flexion    Elbow extension    Wrist flexion    Wrist extension    Wrist ulnar deviation    Wrist radial deviation    Wrist pronation    Wrist supination    Grip  strength     (Blank rows = not tested)  CERVICAL SPECIAL TESTS:  {Cervical special tests:25246}  FUNCTIONAL TESTS:  {Functional tests:24029}  TREATMENT DATE: 05/06/24 physical therapy evaluation and HEP instruction                                                                                                                                 PATIENT EDUCATION:  Education details: Patient educated on exam findings, POC, scope of PT, HEP, and ***. Person educated: Patient Education method: Explanation, Demonstration, and Handouts Education comprehension: verbalized understanding, returned demonstration, verbal cues required, and tactile cues required  HOME EXERCISE PROGRAM: ***  ASSESSMENT:  CLINICAL IMPRESSION: Patient is a 73  y.o. female who was seen today for physical therapy evaluation and treatment for M54.12 (ICD-10-CM) - Radiculopathy, cervical region.   OBJECTIVE  IMPAIRMENTS: {opptimpairments:25111}.   ACTIVITY LIMITATIONS: {activitylimitations:27494}  PARTICIPATION LIMITATIONS: {participationrestrictions:25113}  PERSONAL FACTORS: {Personal factors:25162} are also affecting patient's functional outcome.   REHAB POTENTIAL: {rehabpotential:25112}  CLINICAL DECISION MAKING: {clinical decision making:25114}  EVALUATION COMPLEXITY: {Evaluation complexity:25115}   GOALS: Goals reviewed with patient? No  SHORT TERM GOALS: Target date: ***  patient will be independent with initial HEP  Baseline:  Goal status: INITIAL  2.  Patient will report 50% improvement overall  Baseline:  Goal status: INITIAL  3.  *** Baseline:  Goal status: INITIAL  4.  *** Baseline:  Goal status: INITIAL  5.  *** Baseline:  Goal status: INITIAL  6.  *** Baseline:  Goal status: INITIAL  LONG TERM GOALS: Target date: ***  Patient will be independent in self management strategies to improve quality of life and functional outcomes.  Baseline:  Goal status: INITIAL  2.   Patient will report 70% improvement overall  Baseline:  Goal status: INITIAL  3.  *** Baseline:  Goal status: INITIAL  4.  *** Baseline:  Goal status: INITIAL  5.  *** Baseline:  Goal status: INITIAL  6.  *** Baseline:  Goal status: INITIAL   PLAN:  PT FREQUENCY: {rehab frequency:25116}  PT DURATION: {rehab duration:25117}  PLANNED INTERVENTIONS: 97164- PT Re-evaluation, 97110-Therapeutic exercises, 97530- Therapeutic activity, 97112- Neuromuscular re-education, 97535- Self Care, 02859- Manual therapy, [id number]- Gait training, [id number]- Orthotic Fit/training, [id number]- Canalith repositioning, [id number]- Aquatic Therapy, [id number]- Splinting, [id number]- Wound care (first 20 sq cm), 97598- Wound care (each additional 20 sq cm)Patient/Family education, Balance training, Stair training, Taping, Dry Needling, Joint mobilization, Joint manipulation, Spinal manipulation, Spinal mobilization, Scar mobilization, and DME instructions.   PLAN FOR NEXT SESSION: Review HEP and goals   12:46 PM, 05/05/24 Cheyenne Schumm Small Derrica Sieg MPT [facility] [place] [phone]

## 2024-05-06 ENCOUNTER — Ambulatory Visit (HOSPITAL_COMMUNITY)

## 2024-05-11 ENCOUNTER — Encounter: Payer: Self-pay | Admitting: *Deleted

## 2024-05-14 ENCOUNTER — Ambulatory Visit: Attending: Student | Admitting: Student

## 2024-05-14 ENCOUNTER — Encounter: Payer: Self-pay | Admitting: Student

## 2024-05-14 VITALS — BP 120/80 | HR 61 | Ht 65.0 in | Wt 195.8 lb

## 2024-05-14 DIAGNOSIS — I480 Paroxysmal atrial fibrillation: Secondary | ICD-10-CM

## 2024-05-14 DIAGNOSIS — I10 Essential (primary) hypertension: Secondary | ICD-10-CM

## 2024-05-14 DIAGNOSIS — E785 Hyperlipidemia, unspecified: Secondary | ICD-10-CM | POA: Diagnosis not present

## 2024-05-14 DIAGNOSIS — Z87898 Personal history of other specified conditions: Secondary | ICD-10-CM | POA: Diagnosis not present

## 2024-05-14 NOTE — Patient Instructions (Signed)
 Medication Instructions:  Your physician recommends that you continue on your current medications as directed. Please refer to the Current Medication list given to you today.  *If you need a refill on your cardiac medications before your next appointment, please call your pharmacy*  Lab Work: NONE   If you have labs (blood work) drawn today and your tests are completely normal, you will receive your results only by: MyChart Message (if you have MyChart) OR A paper copy in the mail If you have any lab test that is abnormal or we need to change your treatment, we will call you to review the results.  Testing/Procedures: NONE   Follow-Up: At [facility], you and your health needs are our priority.  As part of our continuing mission to provide you with exceptional heart care, our providers are all part of one team.  This team includes your primary Cardiologist (physician) and Advanced Practice Providers or APPs (Physician Assistants and Nurse Practitioners) who all work together to provide you with the care you need, when you need it.  Your next appointment:   3 -4 month(s)  Provider:   Jayson Sierras, MD    We recommend signing up for the patient portal called MyChart.  Sign up information is provided on this After Visit Summary.  MyChart is used to connect with patients for Virtual Visits (Telemedicine).  Patients are able to view lab/test results, encounter notes, upcoming appointments, etc.  Non-urgent messages can be sent to your provider as well.   To learn more about what you can do with MyChart, go to [email or web address].   Other Instructions Thank you for choosing [facility]!

## 2024-05-14 NOTE — Progress Notes (Signed)
 Cardiology Office Note    Date:  05/14/2024  ID:  TANI VIRGO, DOB Feb 05, 1951, MRN [id number] Cardiologist: Jayson Sierras, MD Cardiology APP:  Johnson Laymon CHRISTELLA, PA-C { :  History of Present Illness:    Selena [name] is a 73 y.o. female with past medical history of chest pain (normal cors by cath in 2005, NST in 2018 showing mild apical ischemia but increased TID ratio which could reflect balanced multivessel ischemia, cardiac catheterization in 10/2020 showing normal coronary arteries), palpitations (PVC's by prior monitor), HTN, HLD and GERD who presents to the office today for hospital follow-up.  She was most recently admitted to [facility] in 03/2024 for evaluation of worsening shortness of breath and was found to be in atrial flutter with RVR which was a new diagnosis for her. She was started on IV Cardizem  and converted to normal sinus rhythm with this. She had been on Lopressor  50 mg twice daily and this was titrated to 75 mg twice daily. Was recommended to transition to Eliquis  for anticoagulation and arrange for a 2-week Zio patch to assess for recurrence and burden.  Her monitor showed predominantly normal sinus rhythm with an average heart rate of 63 bpm. Was noted to have frequent PAC's representing 5.7% of total beats and occasional PVC's representing 3% of total beats. She did have multiple episodes of SVT with the longest lasting for 22 seconds and paroxysmal atrial fibrillation/flutter which occurred 7% of the time. Given her recurrent arrhythmias, EP consultation was recommended by Dr. Sierras. Unfortunately, the office was unable to get in touch with the patient and a letter was sent to her home earlier this week.  In talking with the patient today, she reports still having episodes of weakness and occasional palpitations but reports symptoms have improved since dose adjustment of Lopressor . Reports occasional chest pressure in the setting of tachycardia. Breathing has  overall been stable and no orthopnea, PND or pitting edema. Remains on Eliquis  for anticoagulation with no reports of melena, hematochezia or hematuria.   Studies Reviewed:   EKG: EKG is not ordered today.  Cardiac Catheterization: 10/2020 Normal coronary arteries, left dominant. Normal LV function.  LVEDP 16 mmHg. Unless there is high suspicion for microvascular dysfunction, the patient's symptoms are not related to myocardial ischemia. The abnormal nuclear study from several years ago is a false positive study.   RECOMMENDATIONS:   Further management per primary cardiology team.  Echocardiogram: 03/2024 IMPRESSIONS     1. Left ventricular ejection fraction, by estimation, is 55 to 60%. The  left ventricle has normal function. Left ventricular endocardial border  not optimally defined to evaluate regional wall motion. There is mild left  ventricular hypertrophy. Left  ventricular diastolic parameters were normal.   2. Right ventricular systolic function is normal. The right ventricular  size is normal. Tricuspid regurgitation signal is inadequate for assessing  PA pressure.   3. The mitral valve is normal in structure. No evidence of mitral valve  regurgitation. No evidence of mitral stenosis.   4. The aortic valve was not well visualized. Aortic valve regurgitation  is trivial. No aortic stenosis is present.   5. The inferior vena cava is normal in size with greater than 50%  respiratory variability, suggesting right atrial pressure of 3 mmHg.   Event Monitor: 04/2024 ZIO monitor reviewed.  13 days, 20 hours analyzed.   Predominant rhythm is sinus with heart rate ranging from 42 bpm up to 133 bpm and average heart rate 63  bpm. There were frequent PACs representing 5.7% total beats with otherwise occasional atrial couplets and triplets. There were occasional PVCs representing 3% total beats with otherwise rare ventricular couplets and triplets.  Also limited episodes of  ventricular bigeminy and trigeminy.  Two brief episodes of NSVT were noted, the longest of which was 5 beats. Multiple (2551) episodes of PSVT were noted, the longest of which lasted approximately 22 seconds with average heart rate 118 bpm.  These episodes were possibly an atrial tachycardia. Paroxysmal atrial fibrillation/flutter also occurred at approximately 7% rhythm burden.  The longest episode lasted 10 hours and 47 minutes with average heart rate 111 bpm. There were no pauses or high degree heart block.  Risk Assessment/Calculations:    CHA2DS2-VASc Score = 3  This indicates a 3.2% annual risk of stroke. The patient's score is based upon: CHF History: 0 HTN History: 1 Diabetes History: 0 Stroke History: 0 Vascular Disease History: 0 Age Score: 1 Gender Score: 1     Physical Exam:   VS:  BP 120/80   Pulse 61   Ht 5' 5 (1.651 m)   Wt 195 lb 12.8 oz (88.8 kg)   SpO2 93%   BMI 32.58 kg/m    Wt Readings from Last 3 Encounters:  05/14/24 195 lb 12.8 oz (88.8 kg)  03/16/24 196 lb 13.9 oz (89.3 kg)  11/28/23 192 lb (87.1 kg)     GEN: Well nourished, well developed female appearing in no acute distress NECK: No JVD; No carotid bruits CARDIAC: RRR, no murmurs, rubs, gallops RESPIRATORY:  Clear to auscultation without rales, wheezing or rhonchi  ABDOMEN: Appears non-distended. No obvious abdominal masses. EXTREMITIES: No clubbing or cyanosis. No pitting edema.  Distal pedal pulses are 2+ bilaterally.   Assessment and Plan:   1. Paroxysmal Atrial Fibrillation/Flutter - Diagnosed during her recent admission and she converted back to NSR with IV Cardizem . Outpatient monitor showed frequent PAC's (5.7% burden), PVC's (3% burden), SVT and recurrent atrial fibrillation/flutter with 7% burden. Given that she continues to have symptomatic episodes, will refer to EP for consideration of antiarrhythmic otpions including antiarrhythmic medications or ablation. Briefly reviewed both  with the patient today. For now, will continue Lopressor  75mg  BID. Will not further titrate given HR in the 50's to low-60's today.  - No reports of active bleeding. Continue Eliquis  5mg  BID for anticoagulation which is the appropriate dose given her age, weight and renal function. CBC on 03/17/2024 showed Hgb was stable at 14.0 and platelets at 241 K.   2. History of Chest Pain - She denies any recent exertional chest pain. Work-up has overall been reassuring with normal cors by cath in 2005, false positive NST in 2018 and most recent cardiac catheterization in 10/2020 showed normal coronary arteries. Continue with risk factor modification.   3. HTN - BP is well-controlled at 120/80 during today's visit. Continue current medical therapy with Chlorthalidone  25mg  daily, Losartan  50mg  daily and Lopressor  75mg  BID.   4. HLD - Followed by PCP. Will request a copy of most recent labs. Remains on Simvastatin  20mg  daily.   Signed, Laymon CHRISTELLA Qua, PA-C

## 2024-06-14 NOTE — Progress Notes (Deleted)
 Cardiology Office Note   Date:  06/14/2024  ID:  Selena [name], DOB 1951-01-31, MRN [id number] PCP: Sheryle Carwin, MD  [facility] Providers Cardiologist:  Jayson Sierras, MD Cardiology APP:  Johnson Laymon CHRISTELLA, PA-C  Electrophysiologist:  Donnice DELENA Primus, MD   History of Present Illness Selena [name] is a 73 y.o. female with AFL/RVR, palpitations, PVCs, HTN, HLD and GERD who presents for arrhythmia management.     ROS: ***  Studies Reviewed  ECG review 03/17/24: NSR 69, PR 168, QRS 100, QT/c 392/420 03/16/24: (14:29:00), AF/VR 65, QRS 125, QT/c 430/408 03/16/24: (13:04:12), AFL/VR 108, QRS 141, QT/c 401/483 03/16/24: (12:19:15), AFL/RVR 129, QRS 116, QT/c 296/433 10/01/23: NSR 85, PR 156, QRS 94, QT/c 356/423  Zio monitor Result date: 03/24/24-04/07/24 Predominant rhythm is sinus with heart rate ranging from 42 bpm up to 133 bpm and average heart rate 63 bpm. There were frequent PACs representing 5.7% total beats with otherwise occasional atrial couplets and triplets. There were occasional PVCs representing 3% total beats with otherwise rare ventricular couplets and triplets.  Also limited episodes of ventricular bigeminy and trigeminy.  Two brief episodes of NSVT were noted, the longest of which was 5 beats. Multiple (2551) episodes of PSVT were noted, the longest of which lasted approximately 22 seconds with average heart rate 118 bpm.  These episodes were possibly an atrial tachycardia. Paroxysmal atrial fibrillation/flutter also occurred at approximately 7% rhythm burden.  The longest episode lasted 10 hours and 47 minutes with average heart rate 111 bpm. There were no pauses or high degree heart block.  TTE  Result date: 03/17/24 1. Left ventricular ejection fraction, by estimation, is 55 to 60%. The  left ventricle has normal function. Left ventricular endocardial border  not optimally defined to evaluate regional wall motion. There is mild left   ventricular hypertrophy. Left  ventricular diastolic parameters were normal.   2. Right ventricular systolic function is normal. The right ventricular  size is normal. Tricuspid regurgitation signal is inadequate for assessing  PA pressure.   3. The mitral valve is normal in structure. No evidence of mitral valve  regurgitation. No evidence of mitral stenosis.   4. The aortic valve was not well visualized. Aortic valve regurgitation  is trivial. No aortic stenosis is present.   5. The inferior vena cava is normal in size with greater than 50%  respiratory variability, suggesting right atrial pressure of 3 mmHg.   Coronary angiography  Result date: 10/19/20 Normal coronary arteries, left dominant. Normal LV function.  LVEDP 16 mmHg. Unless there is high suspicion for microvascular dysfunction, the patient's symptoms are not related to myocardial ischemia. The abnormal nuclear study from several years ago is a false positive study. RECOMMENDATIONS: Further management per primary cardiology team.  Risk Assessment/Calculations  CHA2DS2-VASc Score = 3  {Confirm score is correct.  If not, click here to update score.  REFRESH note.  :1} This indicates a 3.2% annual risk of stroke. The patient's score is based upon: CHF History: 0 HTN History: 1 Diabetes History: 0 Stroke History: 0 Vascular Disease History: 0 Age Score: 1 Gender Score: 1   {This patient has a significant risk of stroke if diagnosed with atrial fibrillation.  Please consider VKA or DOAC agent for anticoagulation if the bleeding risk is acceptable.   You can also use the SmartPhrase .[email or web address] for documentation.   :[id number]} No BP recorded.  {Refresh Note OR Click here to enter BP  :1}***  Physical Exam VS:  There were no vitals taken for this visit.       Wt Readings from Last 3 Encounters:  05/14/24 195 lb 12.8 oz (88.8 kg)  03/16/24 196 lb 13.9 oz (89.3 kg)  11/28/23 192 lb (87.1 kg)    GEN: Well  nourished, well developed in no acute distress NECK: No JVD; No carotid bruits CARDIAC: ***RRR, no murmurs, rubs, gallops RESPIRATORY:  Clear to auscultation without rales, wheezing or rhonchi  ABDOMEN: Soft, non-tender, non-distended EXTREMITIES:  No edema; No deformity   ASSESSMENT AND PLAN ***    {Are you ordering a CV Procedure (e.g. stress test, cath, DCCV, TEE, etc)?   Press F2        :[id number]}  Dispo: ***  Signed, Donnice DELENA Primus, MD

## 2024-06-16 ENCOUNTER — Ambulatory Visit: Admitting: Student in an Organized Health Care Education/Training Program

## 2024-06-17 ENCOUNTER — Telehealth: Payer: Self-pay | Admitting: Cardiology

## 2024-06-17 NOTE — Telephone Encounter (Signed)
*  STAT* If patient is at the pharmacy, call can be transferred to refill team.   1. Which medications need to be refilled? (please list name of each medication and dose if known) potassium chloride  (KLOR-CON ) 10 MEQ tablet   2. Which pharmacy/location (including street and city if local pharmacy) is medication to be sent to?  [id number] - [place], [place] - [address] AT [address]      3. Do they need a 30 day or 90 day supply? 90 day

## 2024-06-18 MED ORDER — POTASSIUM CHLORIDE ER 10 MEQ PO TBCR: 10.0000 meq | EXTENDED_RELEASE_TABLET | Freq: Every day | ORAL | 3 refills | Status: AC

## 2024-06-18 NOTE — Telephone Encounter (Signed)
 Refill sent.

## 2024-08-14 ENCOUNTER — Ambulatory Visit
Attending: Student in an Organized Health Care Education/Training Program | Admitting: Student in an Organized Health Care Education/Training Program

## 2024-08-14 ENCOUNTER — Encounter: Payer: Self-pay | Admitting: Student in an Organized Health Care Education/Training Program

## 2024-08-14 VITALS — BP 118/68 | HR 66 | Wt 198.0 lb

## 2024-08-14 DIAGNOSIS — I480 Paroxysmal atrial fibrillation: Secondary | ICD-10-CM | POA: Diagnosis not present

## 2024-08-14 DIAGNOSIS — I483 Typical atrial flutter: Secondary | ICD-10-CM | POA: Diagnosis not present

## 2024-08-14 NOTE — Patient Instructions (Signed)
 Medication Instructions:  Your physician recommends that you continue on your current medications as directed. Please refer to the Current Medication list given to you today.  *If you need a refill on your cardiac medications before your next appointment, please call your pharmacy*  Lab Work: None ordered.  If you have labs (blood work) drawn today and your tests are completely normal, you will receive your results only by: MyChart Message (if you have MyChart) OR A paper copy in the mail If you have any lab test that is abnormal or we need to change your treatment, we will call you to review the results.  Testing/Procedures: None ordered.   Follow-Up: At [facility], you and your health needs are our priority.  As part of our continuing mission to provide you with exceptional heart care, our providers are all part of one team.  This team includes your primary Cardiologist (physician) and Advanced Practice Providers or APPs (Physician Assistants and Nurse Practitioners) who all work together to provide you with the care you need, when you need it.  Your next appointment:   Follow up with Dr Almetta as needed

## 2024-08-14 NOTE — Progress Notes (Unsigned)
" °  Cardiology Office Note   Date:  08/14/2024  ID:  AKAYLA [name], DOB September 03, 1950, MRN [id number] PCP: Sheryle Carwin, MD  [facility] Providers Cardiologist:  Jayson Sierras, MD Cardiology APP:  Johnson Laymon CHRISTELLA, PA-C  Electrophysiologist:  Donnice DELENA Primus, MD { Click to update primary MD,subspecialty MD or APP then REFRESH:1}    History of Present Illness Selena [name] is a 74 y.o. female ***  ROS: ***  Studies Reviewed      *** Risk Assessment/Calculations {Does this patient have ATRIAL FIBRILLATION?:[phone]}         Physical Exam VS:  BP 118/68 (BP Location: Right Arm, Cuff Size: Normal)   Pulse 66   Wt 198 lb (89.8 kg)   SpO2 94%   BMI 32.95 kg/m        Wt Readings from Last 3 Encounters:  08/14/24 198 lb (89.8 kg)  05/14/24 195 lb 12.8 oz (88.8 kg)  03/16/24 196 lb 13.9 oz (89.3 kg)    GEN: Well nourished, well developed in no acute distress NECK: No JVD; No carotid bruits CARDIAC: ***RRR, no murmurs, rubs, gallops RESPIRATORY:  Clear to auscultation without rales, wheezing or rhonchi  ABDOMEN: Soft, non-tender, non-distended EXTREMITIES:  No edema; No deformity   ASSESSMENT AND PLAN ***    {Are you ordering a CV Procedure (e.g. stress test, cath, DCCV, TEE, etc)?   Press F2        :[id number]}  Dispo: ***  Signed, Donnice DELENA Primus, MD  "

## 2024-09-15 ENCOUNTER — Ambulatory Visit: Admitting: Student
